# Patient Record
Sex: Female | Born: 1950 | Race: Black or African American | Hispanic: No | Smoking: Never smoker
Health system: Southern US, Community
[De-identification: ages and names within clinical notes are randomized; demographics above are authoritative.]

## PROBLEM LIST (undated history)

## (undated) DIAGNOSIS — I10 Essential (primary) hypertension: Secondary | ICD-10-CM

## (undated) DIAGNOSIS — E119 Type 2 diabetes mellitus without complications: Secondary | ICD-10-CM

## (undated) DIAGNOSIS — I639 Cerebral infarction, unspecified: Secondary | ICD-10-CM

## (undated) DIAGNOSIS — R3129 Other microscopic hematuria: Secondary | ICD-10-CM

## (undated) DIAGNOSIS — R6521 Severe sepsis with septic shock: Secondary | ICD-10-CM

## (undated) DIAGNOSIS — N200 Calculus of kidney: Secondary | ICD-10-CM

## (undated) DIAGNOSIS — G4733 Obstructive sleep apnea (adult) (pediatric): Secondary | ICD-10-CM

## (undated) DIAGNOSIS — R0600 Dyspnea, unspecified: Secondary | ICD-10-CM

## (undated) DIAGNOSIS — N179 Acute kidney failure, unspecified: Secondary | ICD-10-CM

## (undated) DIAGNOSIS — N390 Urinary tract infection, site not specified: Secondary | ICD-10-CM

## (undated) DIAGNOSIS — K219 Gastro-esophageal reflux disease without esophagitis: Secondary | ICD-10-CM

## (undated) DIAGNOSIS — Z8673 Personal history of transient ischemic attack (TIA), and cerebral infarction without residual deficits: Secondary | ICD-10-CM

## (undated) DIAGNOSIS — E118 Type 2 diabetes mellitus with unspecified complications: Secondary | ICD-10-CM

## (undated) DIAGNOSIS — A419 Sepsis, unspecified organism: Secondary | ICD-10-CM

## (undated) DIAGNOSIS — Z0000 Encounter for general adult medical examination without abnormal findings: Secondary | ICD-10-CM

## (undated) DIAGNOSIS — I739 Peripheral vascular disease, unspecified: Secondary | ICD-10-CM

## (undated) HISTORY — DX: Personal history of transient ischemic attack (TIA), and cerebral infarction without residual deficits: Z86.73

## (undated) HISTORY — PX: COMBINED LAPAROSCOPY W/ HYSTEROSCOPY: SUR299

## (undated) HISTORY — DX: Other microscopic hematuria: R31.29

## (undated) HISTORY — DX: Essential (primary) hypertension: I10

## (undated) HISTORY — DX: Encounter for general adult medical examination without abnormal findings: Z00.00

## (undated) HISTORY — DX: Type 2 diabetes mellitus with unspecified complications: E11.8

## (undated) HISTORY — DX: Calculus of kidney: N20.0

## (undated) HISTORY — PX: VAGINAL HYSTERECTOMY: SUR661

## (undated) HISTORY — DX: Acute kidney failure, unspecified: N17.9

## (undated) HISTORY — DX: Obstructive sleep apnea (adult) (pediatric): G47.33

---

## 1969-02-24 HISTORY — PX: BREAST EXCISIONAL BIOPSY: SUR124

## 2004-09-05 ENCOUNTER — Ambulatory Visit: Payer: Self-pay | Admitting: Internal Medicine

## 2005-09-16 ENCOUNTER — Ambulatory Visit: Payer: Self-pay | Admitting: Internal Medicine

## 2005-09-17 ENCOUNTER — Ambulatory Visit: Payer: Self-pay | Admitting: Internal Medicine

## 2006-09-24 ENCOUNTER — Ambulatory Visit: Payer: Self-pay | Admitting: Internal Medicine

## 2006-12-08 ENCOUNTER — Ambulatory Visit: Payer: Self-pay | Admitting: Internal Medicine

## 2007-06-25 ENCOUNTER — Ambulatory Visit: Payer: Self-pay | Admitting: Unknown Physician Specialty

## 2007-09-27 ENCOUNTER — Ambulatory Visit: Payer: Self-pay | Admitting: Internal Medicine

## 2008-09-27 ENCOUNTER — Ambulatory Visit: Payer: Self-pay | Admitting: Internal Medicine

## 2009-01-02 ENCOUNTER — Ambulatory Visit: Payer: Self-pay | Admitting: Urology

## 2009-01-22 ENCOUNTER — Ambulatory Visit: Payer: Self-pay | Admitting: Urology

## 2009-03-01 ENCOUNTER — Ambulatory Visit: Payer: Self-pay | Admitting: Urology

## 2009-03-12 ENCOUNTER — Ambulatory Visit: Payer: Self-pay | Admitting: Urology

## 2009-03-29 ENCOUNTER — Ambulatory Visit: Payer: Self-pay | Admitting: Urology

## 2009-10-03 ENCOUNTER — Ambulatory Visit: Payer: Self-pay | Admitting: Internal Medicine

## 2010-02-19 IMAGING — CR DG ABDOMEN 1V
1 series · 1 of 1 positions shown · non-contrast
Comparison: none

REASON FOR EXAM: pre eswl flank pain
COMMENTS:

[view not recorded]
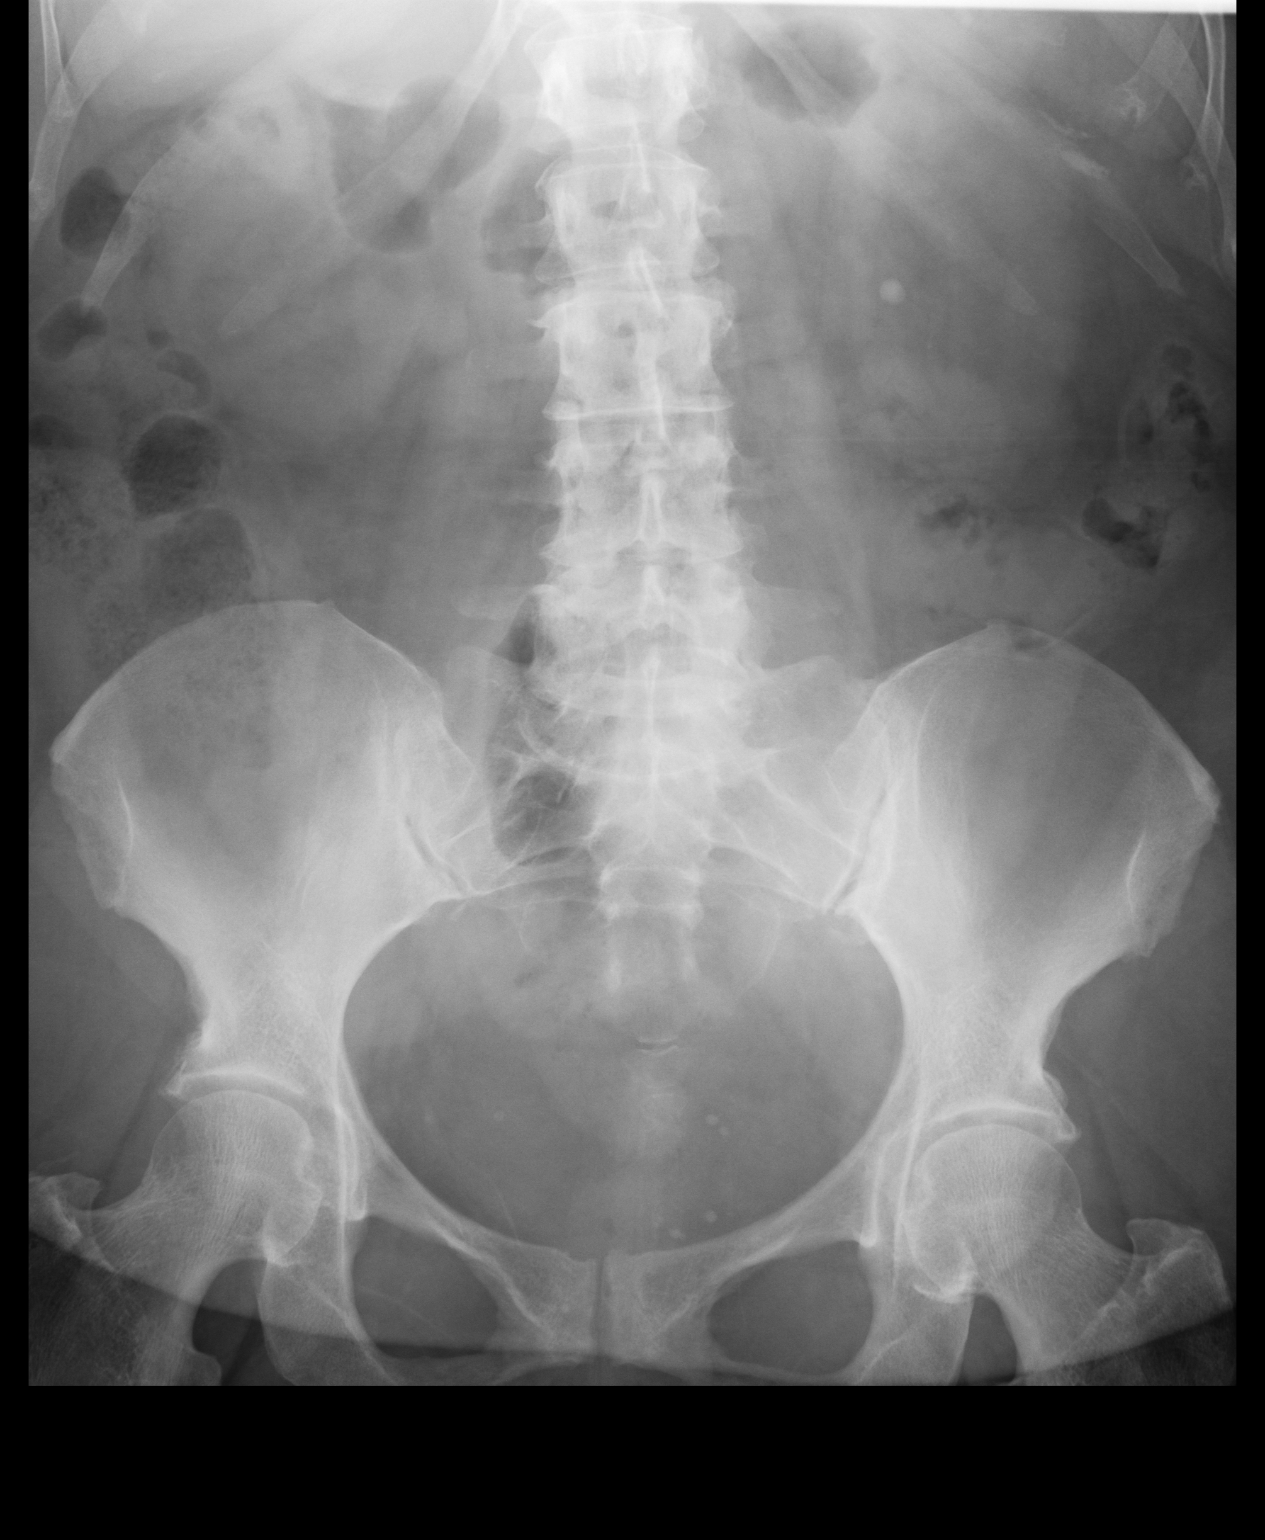

[1 of 1 positions shown; findings below may reference images not displayed]

PROCEDURE:     DXR - DXR KIDNEY URETER BLADDER  - March 01, 2009 [DATE]

RESULT:     Comparison is made to the prior exam of 01/22/2009. There is an
8 mm calcification projected over the midpole region of the left kidney near
the level of the left renal pelvis. No definite right renal or right
ureteral stones are seen. Multiple phleboliths are again observed in the
pelvis.
IMPRESSION: 1.     Left nephrolithiasis.

## 2010-03-27 ENCOUNTER — Ambulatory Visit: Payer: Self-pay | Admitting: Urology

## 2010-11-06 ENCOUNTER — Ambulatory Visit: Payer: Self-pay | Admitting: Internal Medicine

## 2011-02-25 HISTORY — PX: EXTRACORPOREAL SHOCK WAVE LITHOTRIPSY: SHX1557

## 2011-04-02 ENCOUNTER — Ambulatory Visit: Payer: Self-pay | Admitting: Urology

## 2011-11-07 ENCOUNTER — Ambulatory Visit: Payer: Self-pay | Admitting: Internal Medicine

## 2012-11-05 ENCOUNTER — Ambulatory Visit: Payer: Self-pay | Admitting: Gastroenterology

## 2012-11-08 LAB — PATHOLOGY REPORT

## 2012-11-18 ENCOUNTER — Ambulatory Visit: Payer: Self-pay | Admitting: Internal Medicine

## 2013-11-21 DIAGNOSIS — G4733 Obstructive sleep apnea (adult) (pediatric): Secondary | ICD-10-CM

## 2013-11-21 DIAGNOSIS — E118 Type 2 diabetes mellitus with unspecified complications: Secondary | ICD-10-CM

## 2013-11-21 HISTORY — DX: Type 2 diabetes mellitus with unspecified complications: E11.8

## 2013-11-21 HISTORY — DX: Obstructive sleep apnea (adult) (pediatric): G47.33

## 2013-11-29 ENCOUNTER — Ambulatory Visit: Payer: Self-pay | Admitting: Internal Medicine

## 2014-02-03 ENCOUNTER — Ambulatory Visit: Payer: Self-pay | Admitting: Internal Medicine

## 2014-04-25 DIAGNOSIS — I639 Cerebral infarction, unspecified: Secondary | ICD-10-CM

## 2014-04-25 HISTORY — DX: Cerebral infarction, unspecified: I63.9

## 2014-04-28 ENCOUNTER — Ambulatory Visit: Payer: Self-pay | Admitting: Specialist

## 2014-05-07 ENCOUNTER — Emergency Department: Payer: Self-pay | Admitting: Emergency Medicine

## 2014-05-09 DIAGNOSIS — E782 Mixed hyperlipidemia: Secondary | ICD-10-CM | POA: Insufficient documentation

## 2014-05-24 ENCOUNTER — Ambulatory Visit
Admit: 2014-05-24 | Disposition: A | Discharge status: Home / Self Care | Payer: Self-pay | Attending: Internal Medicine | Admitting: Internal Medicine

## 2014-07-10 ENCOUNTER — Other Ambulatory Visit: Payer: Self-pay | Admitting: Internal Medicine

## 2014-07-10 DIAGNOSIS — I6350 Cerebral infarction due to unspecified occlusion or stenosis of unspecified cerebral artery: Secondary | ICD-10-CM

## 2014-07-12 ENCOUNTER — Ambulatory Visit
Admission: RE | Admit: 2014-07-12 | Discharge: 2014-07-12 | Disposition: A | Discharge status: Home / Self Care | Payer: 59 | Source: Ambulatory Visit | Attending: Internal Medicine | Admitting: Internal Medicine

## 2014-07-12 DIAGNOSIS — I6523 Occlusion and stenosis of bilateral carotid arteries: Secondary | ICD-10-CM | POA: Diagnosis not present

## 2014-07-12 DIAGNOSIS — I6350 Cerebral infarction due to unspecified occlusion or stenosis of unspecified cerebral artery: Secondary | ICD-10-CM | POA: Diagnosis present

## 2014-11-28 ENCOUNTER — Other Ambulatory Visit: Payer: Self-pay | Admitting: Internal Medicine

## 2014-11-28 DIAGNOSIS — Z1231 Encounter for screening mammogram for malignant neoplasm of breast: Secondary | ICD-10-CM

## 2014-12-05 ENCOUNTER — Ambulatory Visit
Admission: RE | Admit: 2014-12-05 | Discharge: 2014-12-05 | Disposition: A | Discharge status: Home / Self Care | Payer: Commercial Managed Care - HMO | Source: Ambulatory Visit | Attending: Internal Medicine | Admitting: Internal Medicine

## 2014-12-05 ENCOUNTER — Other Ambulatory Visit: Payer: Self-pay | Admitting: Internal Medicine

## 2014-12-05 DIAGNOSIS — Z1231 Encounter for screening mammogram for malignant neoplasm of breast: Secondary | ICD-10-CM | POA: Diagnosis not present

## 2015-12-20 ENCOUNTER — Other Ambulatory Visit: Payer: Self-pay | Admitting: Internal Medicine

## 2015-12-20 DIAGNOSIS — Z1231 Encounter for screening mammogram for malignant neoplasm of breast: Secondary | ICD-10-CM

## 2015-12-20 DIAGNOSIS — E538 Deficiency of other specified B group vitamins: Secondary | ICD-10-CM | POA: Insufficient documentation

## 2015-12-21 ENCOUNTER — Ambulatory Visit
Admission: RE | Admit: 2015-12-21 | Discharge: 2015-12-21 | Disposition: A | Discharge status: Home / Self Care | Payer: Commercial Managed Care - HMO | Source: Ambulatory Visit | Attending: Internal Medicine | Admitting: Internal Medicine

## 2015-12-21 DIAGNOSIS — Z1231 Encounter for screening mammogram for malignant neoplasm of breast: Secondary | ICD-10-CM | POA: Insufficient documentation

## 2016-02-25 DIAGNOSIS — N179 Acute kidney failure, unspecified: Secondary | ICD-10-CM

## 2016-02-25 HISTORY — DX: Acute kidney failure, unspecified: N17.9

## 2016-06-16 DIAGNOSIS — Z8673 Personal history of transient ischemic attack (TIA), and cerebral infarction without residual deficits: Secondary | ICD-10-CM | POA: Insufficient documentation

## 2016-06-16 DIAGNOSIS — Z0000 Encounter for general adult medical examination without abnormal findings: Secondary | ICD-10-CM

## 2016-06-16 HISTORY — DX: Personal history of transient ischemic attack (TIA), and cerebral infarction without residual deficits: Z86.73

## 2016-06-16 HISTORY — DX: Encounter for general adult medical examination without abnormal findings: Z00.00

## 2016-08-24 ENCOUNTER — Inpatient Hospital Stay: Payer: Medicare PPO

## 2016-08-24 ENCOUNTER — Emergency Department: Payer: Medicare PPO

## 2016-08-24 ENCOUNTER — Inpatient Hospital Stay
Admission: EM | Admit: 2016-08-24 | Discharge: 2016-09-05 | DRG: 870 | Disposition: A | Discharge status: Skilled Nursing Facility | Payer: Medicare PPO | Attending: Internal Medicine | Admitting: Internal Medicine

## 2016-08-24 ENCOUNTER — Encounter: Payer: Self-pay | Admitting: Emergency Medicine

## 2016-08-24 DIAGNOSIS — N184 Chronic kidney disease, stage 4 (severe): Secondary | ICD-10-CM | POA: Diagnosis present

## 2016-08-24 DIAGNOSIS — N201 Calculus of ureter: Secondary | ICD-10-CM

## 2016-08-24 DIAGNOSIS — E1152 Type 2 diabetes mellitus with diabetic peripheral angiopathy with gangrene: Secondary | ICD-10-CM | POA: Diagnosis present

## 2016-08-24 DIAGNOSIS — D631 Anemia in chronic kidney disease: Secondary | ICD-10-CM | POA: Diagnosis present

## 2016-08-24 DIAGNOSIS — R109 Unspecified abdominal pain: Secondary | ICD-10-CM | POA: Diagnosis not present

## 2016-08-24 DIAGNOSIS — N200 Calculus of kidney: Secondary | ICD-10-CM

## 2016-08-24 DIAGNOSIS — I248 Other forms of acute ischemic heart disease: Secondary | ICD-10-CM | POA: Diagnosis present

## 2016-08-24 DIAGNOSIS — G9340 Encephalopathy, unspecified: Secondary | ICD-10-CM | POA: Diagnosis not present

## 2016-08-24 DIAGNOSIS — D65 Disseminated intravascular coagulation [defibrination syndrome]: Secondary | ICD-10-CM | POA: Diagnosis present

## 2016-08-24 DIAGNOSIS — A419 Sepsis, unspecified organism: Secondary | ICD-10-CM | POA: Diagnosis present

## 2016-08-24 DIAGNOSIS — J80 Acute respiratory distress syndrome: Secondary | ICD-10-CM | POA: Diagnosis present

## 2016-08-24 DIAGNOSIS — R310 Gross hematuria: Secondary | ICD-10-CM | POA: Diagnosis not present

## 2016-08-24 DIAGNOSIS — A4159 Other Gram-negative sepsis: Principal | ICD-10-CM | POA: Diagnosis present

## 2016-08-24 DIAGNOSIS — N136 Pyonephrosis: Secondary | ICD-10-CM | POA: Diagnosis present

## 2016-08-24 DIAGNOSIS — N170 Acute kidney failure with tubular necrosis: Secondary | ICD-10-CM | POA: Diagnosis present

## 2016-08-24 DIAGNOSIS — E2740 Unspecified adrenocortical insufficiency: Secondary | ICD-10-CM | POA: Diagnosis present

## 2016-08-24 DIAGNOSIS — D72829 Elevated white blood cell count, unspecified: Secondary | ICD-10-CM

## 2016-08-24 DIAGNOSIS — Z79899 Other long term (current) drug therapy: Secondary | ICD-10-CM | POA: Diagnosis not present

## 2016-08-24 DIAGNOSIS — R531 Weakness: Secondary | ICD-10-CM | POA: Diagnosis not present

## 2016-08-24 DIAGNOSIS — R000 Tachycardia, unspecified: Secondary | ICD-10-CM

## 2016-08-24 DIAGNOSIS — R34 Anuria and oliguria: Secondary | ICD-10-CM | POA: Diagnosis present

## 2016-08-24 DIAGNOSIS — G7281 Critical illness myopathy: Secondary | ICD-10-CM | POA: Diagnosis present

## 2016-08-24 DIAGNOSIS — R1915 Other abnormal bowel sounds: Secondary | ICD-10-CM

## 2016-08-24 DIAGNOSIS — R4182 Altered mental status, unspecified: Secondary | ICD-10-CM | POA: Diagnosis not present

## 2016-08-24 DIAGNOSIS — J9601 Acute respiratory failure with hypoxia: Secondary | ICD-10-CM | POA: Diagnosis present

## 2016-08-24 DIAGNOSIS — Z8673 Personal history of transient ischemic attack (TIA), and cerebral infarction without residual deficits: Secondary | ICD-10-CM | POA: Diagnosis not present

## 2016-08-24 DIAGNOSIS — N12 Tubulo-interstitial nephritis, not specified as acute or chronic: Secondary | ICD-10-CM | POA: Diagnosis not present

## 2016-08-24 DIAGNOSIS — J9690 Respiratory failure, unspecified, unspecified whether with hypoxia or hypercapnia: Secondary | ICD-10-CM | POA: Diagnosis not present

## 2016-08-24 DIAGNOSIS — J189 Pneumonia, unspecified organism: Secondary | ICD-10-CM | POA: Diagnosis present

## 2016-08-24 DIAGNOSIS — E872 Acidosis: Secondary | ICD-10-CM | POA: Diagnosis present

## 2016-08-24 DIAGNOSIS — Z7984 Long term (current) use of oral hypoglycemic drugs: Secondary | ICD-10-CM | POA: Diagnosis not present

## 2016-08-24 DIAGNOSIS — N1330 Unspecified hydronephrosis: Secondary | ICD-10-CM

## 2016-08-24 DIAGNOSIS — N139 Obstructive and reflux uropathy, unspecified: Secondary | ICD-10-CM

## 2016-08-24 DIAGNOSIS — Z4659 Encounter for fitting and adjustment of other gastrointestinal appliance and device: Secondary | ICD-10-CM

## 2016-08-24 DIAGNOSIS — J9600 Acute respiratory failure, unspecified whether with hypoxia or hypercapnia: Secondary | ICD-10-CM

## 2016-08-24 DIAGNOSIS — R6521 Severe sepsis with septic shock: Secondary | ICD-10-CM | POA: Diagnosis not present

## 2016-08-24 DIAGNOSIS — B961 Klebsiella pneumoniae [K. pneumoniae] as the cause of diseases classified elsewhere: Secondary | ICD-10-CM | POA: Diagnosis not present

## 2016-08-24 DIAGNOSIS — N189 Chronic kidney disease, unspecified: Secondary | ICD-10-CM | POA: Diagnosis not present

## 2016-08-24 DIAGNOSIS — G92 Toxic encephalopathy: Secondary | ICD-10-CM | POA: Diagnosis present

## 2016-08-24 DIAGNOSIS — N132 Hydronephrosis with renal and ureteral calculous obstruction: Secondary | ICD-10-CM | POA: Diagnosis not present

## 2016-08-24 DIAGNOSIS — N172 Acute kidney failure with medullary necrosis: Secondary | ICD-10-CM | POA: Diagnosis not present

## 2016-08-24 DIAGNOSIS — I129 Hypertensive chronic kidney disease with stage 1 through stage 4 chronic kidney disease, or unspecified chronic kidney disease: Secondary | ICD-10-CM | POA: Diagnosis present

## 2016-08-24 DIAGNOSIS — N179 Acute kidney failure, unspecified: Secondary | ICD-10-CM

## 2016-08-24 DIAGNOSIS — R7881 Bacteremia: Secondary | ICD-10-CM | POA: Diagnosis not present

## 2016-08-24 DIAGNOSIS — E1122 Type 2 diabetes mellitus with diabetic chronic kidney disease: Secondary | ICD-10-CM | POA: Diagnosis present

## 2016-08-24 DIAGNOSIS — E119 Type 2 diabetes mellitus without complications: Secondary | ICD-10-CM | POA: Diagnosis not present

## 2016-08-24 DIAGNOSIS — K7460 Unspecified cirrhosis of liver: Secondary | ICD-10-CM | POA: Diagnosis present

## 2016-08-24 DIAGNOSIS — R6520 Severe sepsis without septic shock: Secondary | ICD-10-CM | POA: Diagnosis not present

## 2016-08-24 DIAGNOSIS — N390 Urinary tract infection, site not specified: Secondary | ICD-10-CM | POA: Diagnosis not present

## 2016-08-24 HISTORY — PX: IR NEPHROSTOMY PLACEMENT LEFT: IMG6063

## 2016-08-24 HISTORY — DX: Cerebral infarction, unspecified: I63.9

## 2016-08-24 HISTORY — DX: Type 2 diabetes mellitus without complications: E11.9

## 2016-08-24 HISTORY — DX: Essential (primary) hypertension: I10

## 2016-08-24 LAB — URINALYSIS, COMPLETE (UACMP) WITH MICROSCOPIC
Bilirubin Urine: NEGATIVE
GLUCOSE, UA: NEGATIVE mg/dL
KETONES UR: 5 mg/dL — AB
Leukocytes, UA: NEGATIVE
Nitrite: POSITIVE — AB
PROTEIN: 100 mg/dL — AB
Specific Gravity, Urine: 1.019 (ref 1.005–1.030)
pH: 5 (ref 5.0–8.0)

## 2016-08-24 LAB — CREATININE, SERUM
CREATININE: 2.57 mg/dL — AB (ref 0.44–1.00)
GFR, EST AFRICAN AMERICAN: 21 mL/min — AB (ref >60)
GFR, EST NON AFRICAN AMERICAN: 18 mL/min — AB (ref >60)

## 2016-08-24 LAB — GLUCOSE, CAPILLARY
GLUCOSE-CAPILLARY: 41 mg/dL — AB (ref 65–99)
GLUCOSE-CAPILLARY: 242 mg/dL — AB (ref 65–99)
Glucose-Capillary: 89 mg/dL (ref 65–99)
Glucose-Capillary: 55 mg/dL — ABNORMAL LOW (ref 65–99)
Glucose-Capillary: 165 mg/dL — ABNORMAL HIGH (ref 65–99)

## 2016-08-24 LAB — CBC WITH DIFFERENTIAL/PLATELET
BAND NEUTROPHILS: 13 %
BASOS PCT: 0 %
Basophils Absolute: 0 10*3/uL (ref 0–0.1)
Blasts: 0 %
EOS ABS: 0 10*3/uL (ref 0–0.7)
Eosinophils Relative: 0 %
HCT: 34.7 % — ABNORMAL LOW (ref 35.0–47.0)
Hemoglobin: 11.1 g/dL — ABNORMAL LOW (ref 12.0–16.0)
LYMPHS PCT: 7 %
Lymphs Abs: 1.6 10*3/uL (ref 1.0–3.6)
MCH: 29.3 pg (ref 26.0–34.0)
MCHC: 31.9 g/dL — AB (ref 32.0–36.0)
MCV: 91.9 fL (ref 80.0–100.0)
MONO ABS: 0.5 10*3/uL (ref 0.2–0.9)
MONOS PCT: 2 %
Metamyelocytes Relative: 17 %
Myelocytes: 0 %
NEUTROS ABS: 21.3 10*3/uL — AB (ref 1.4–6.5)
Neutrophils Relative %: 61 %
Other: 0 %
PLATELETS: 69 10*3/uL — AB (ref 150–440)
Promyelocytes Absolute: 0 %
RBC: 3.77 MIL/uL — ABNORMAL LOW (ref 3.80–5.20)
RDW: 17.2 % — ABNORMAL HIGH (ref 11.5–14.5)
WBC: 23.4 10*3/uL — ABNORMAL HIGH (ref 3.6–11.0)
nRBC: 0 /100 WBC

## 2016-08-24 LAB — BLOOD CULTURE ID PANEL (REFLEXED)
Acinetobacter baumannii: NOT DETECTED
CANDIDA KRUSEI: NOT DETECTED
CARBAPENEM RESISTANCE: NOT DETECTED
Candida albicans: NOT DETECTED
Candida glabrata: NOT DETECTED
Candida parapsilosis: NOT DETECTED
Candida tropicalis: NOT DETECTED
ENTEROBACTERIACEAE SPECIES: DETECTED — AB
ENTEROCOCCUS SPECIES: NOT DETECTED
Enterobacter cloacae complex: NOT DETECTED
Escherichia coli: NOT DETECTED
Haemophilus influenzae: NOT DETECTED
Klebsiella oxytoca: NOT DETECTED
Klebsiella pneumoniae: DETECTED — AB
LISTERIA MONOCYTOGENES: NOT DETECTED
Neisseria meningitidis: NOT DETECTED
PSEUDOMONAS AERUGINOSA: NOT DETECTED
Proteus species: NOT DETECTED
SERRATIA MARCESCENS: NOT DETECTED
STAPHYLOCOCCUS AUREUS BCID: NOT DETECTED
STAPHYLOCOCCUS SPECIES: NOT DETECTED
STREPTOCOCCUS PNEUMONIAE: NOT DETECTED
STREPTOCOCCUS PYOGENES: NOT DETECTED
STREPTOCOCCUS SPECIES: NOT DETECTED
Streptococcus agalactiae: NOT DETECTED

## 2016-08-24 LAB — COMPREHENSIVE METABOLIC PANEL
ALBUMIN: 2.7 g/dL — AB (ref 3.5–5.0)
ALK PHOS: 78 U/L (ref 38–126)
ALK PHOS: 79 U/L (ref 38–126)
ALT: 44 U/L (ref 14–54)
ALT: 43 U/L (ref 14–54)
ANION GAP: 20 — AB (ref 5–15)
ANION GAP: 24 — AB (ref 5–15)
AST: 83 U/L — ABNORMAL HIGH (ref 15–41)
AST: 94 U/L — AB (ref 15–41)
Albumin: 3.2 g/dL — ABNORMAL LOW (ref 3.5–5.0)
BUN: 27 mg/dL — ABNORMAL HIGH (ref 6–20)
BUN: 35 mg/dL — AB (ref 6–20)
CALCIUM: 8.7 mg/dL — AB (ref 8.9–10.3)
CO2: 17 mmol/L — AB (ref 22–32)
CO2: 12 mmol/L — AB (ref 22–32)
Calcium: 7.1 mg/dL — ABNORMAL LOW (ref 8.9–10.3)
Chloride: 101 mmol/L (ref 101–111)
Chloride: 98 mmol/L — ABNORMAL LOW (ref 101–111)
Creatinine, Ser: 1.91 mg/dL — ABNORMAL HIGH (ref 0.44–1.00)
Creatinine, Ser: 2.62 mg/dL — ABNORMAL HIGH (ref 0.44–1.00)
GFR calc Af Amer: 20 mL/min — ABNORMAL LOW (ref >60)
GFR calc non Af Amer: 26 mL/min — ABNORMAL LOW (ref >60)
GFR calc non Af Amer: 18 mL/min — ABNORMAL LOW (ref >60)
GFR, EST AFRICAN AMERICAN: 30 mL/min — AB (ref >60)
GLUCOSE: 274 mg/dL — AB (ref 65–99)
Glucose, Bld: 76 mg/dL (ref 65–99)
POTASSIUM: 3.7 mmol/L (ref 3.5–5.1)
Potassium: 3.5 mmol/L (ref 3.5–5.1)
SODIUM: 138 mmol/L (ref 135–145)
SODIUM: 134 mmol/L — AB (ref 135–145)
TOTAL PROTEIN: 6.7 g/dL (ref 6.5–8.1)
Total Bilirubin: 0.9 mg/dL (ref 0.3–1.2)
Total Bilirubin: 1 mg/dL (ref 0.3–1.2)
Total Protein: 5.8 g/dL — ABNORMAL LOW (ref 6.5–8.1)

## 2016-08-24 LAB — CBC
HCT: 39.5 % (ref 35.0–47.0)
HCT: 36.1 % (ref 35.0–47.0)
HEMOGLOBIN: 11.9 g/dL — AB (ref 12.0–16.0)
Hemoglobin: 13 g/dL (ref 12.0–16.0)
MCH: 29.2 pg (ref 26.0–34.0)
MCH: 30.1 pg (ref 26.0–34.0)
MCHC: 32.9 g/dL (ref 32.0–36.0)
MCHC: 32.9 g/dL (ref 32.0–36.0)
MCV: 88.9 fL (ref 80.0–100.0)
MCV: 91.3 fL (ref 80.0–100.0)
PLATELETS: 87 10*3/uL — AB (ref 150–440)
Platelets: 54 10*3/uL — ABNORMAL LOW (ref 150–440)
RBC: 4.44 MIL/uL (ref 3.80–5.20)
RBC: 3.96 MIL/uL (ref 3.80–5.20)
RDW: 16.3 % — ABNORMAL HIGH (ref 11.5–14.5)
RDW: 17.3 % — AB (ref 11.5–14.5)
WBC: 14.8 10*3/uL — ABNORMAL HIGH (ref 3.6–11.0)
WBC: 34.1 10*3/uL — AB (ref 3.6–11.0)

## 2016-08-24 LAB — BLOOD GAS, ARTERIAL
ACID-BASE DEFICIT: 10.1 mmol/L — AB (ref 0.0–2.0)
Acid-base deficit: 18.1 mmol/L — ABNORMAL HIGH (ref 0.0–2.0)
Bicarbonate: 14 mmol/L — ABNORMAL LOW (ref 20.0–28.0)
Bicarbonate: 9.5 mmol/L — ABNORMAL LOW (ref 20.0–28.0)
FIO2: 0.28
FIO2: 0.7
LHR: 20 {breaths}/min
MECHVT: 500 mL
O2 SAT: 91.7 %
O2 SAT: 95.5 %
PCO2 ART: 26 mmHg — AB (ref 32.0–48.0)
PCO2 ART: 28 mmHg — AB (ref 32.0–48.0)
PEEP/CPAP: 5 cmH2O
PH ART: 7.14 — AB (ref 7.350–7.450)
PO2 ART: 101 mmHg (ref 83.0–108.0)
Patient temperature: 37
Patient temperature: 37
pH, Arterial: 7.34 — ABNORMAL LOW (ref 7.350–7.450)
pO2, Arterial: 67 mmHg — ABNORMAL LOW (ref 83.0–108.0)

## 2016-08-24 LAB — TROPONIN I
TROPONIN I: 0.13 ng/mL — AB (ref <0.03)
Troponin I: 0.04 ng/mL (ref <0.03)
Troponin I: 0.13 ng/mL (ref <0.03)
Troponin I: 0.77 ng/mL (ref <0.03)

## 2016-08-24 LAB — APTT
aPTT: 64 seconds — ABNORMAL HIGH (ref 24–36)
aPTT: 68 seconds — ABNORMAL HIGH (ref 24–36)
aPTT: 52 seconds — ABNORMAL HIGH (ref 24–36)

## 2016-08-24 LAB — TYPE AND SCREEN
ABO/RH(D): O POS
ANTIBODY SCREEN: NEGATIVE

## 2016-08-24 LAB — PROTIME-INR
INR: 2.05
INR: 2.22
INR: 2.17
Prothrombin Time: 23.4 seconds — ABNORMAL HIGH (ref 11.4–15.2)
Prothrombin Time: 25 seconds — ABNORMAL HIGH (ref 11.4–15.2)
Prothrombin Time: 24.5 seconds — ABNORMAL HIGH (ref 11.4–15.2)

## 2016-08-24 LAB — LACTIC ACID, PLASMA
LACTIC ACID, VENOUS: 10.6 mmol/L — AB (ref 0.5–1.9)
LACTIC ACID, VENOUS: 8.5 mmol/L — AB (ref 0.5–1.9)
Lactic Acid, Venous: 14 mmol/L (ref 0.5–1.9)

## 2016-08-24 LAB — PROCALCITONIN: Procalcitonin: >150 ng/mL

## 2016-08-24 LAB — TRIGLYCERIDES: Triglycerides: 505 mg/dL — ABNORMAL HIGH (ref <150)

## 2016-08-24 LAB — MAGNESIUM: Magnesium: 1.1 mg/dL — ABNORMAL LOW (ref 1.7–2.4)

## 2016-08-24 LAB — FIBRINOGEN: Fibrinogen: 253 mg/dL (ref 210–475)

## 2016-08-24 LAB — PHOSPHORUS: PHOSPHORUS: 6.5 mg/dL — AB (ref 2.5–4.6)

## 2016-08-24 LAB — FIBRIN DERIVATIVES D-DIMER (ARMC ONLY)

## 2016-08-24 LAB — VANCOMYCIN, RANDOM: Vancomycin Rm: 15

## 2016-08-24 LAB — MRSA PCR SCREENING: MRSA BY PCR: NEGATIVE

## 2016-08-24 MED ORDER — SODIUM CHLORIDE 0.9 % IV BOLUS (SEPSIS)
1000.0000 mL | INTRAVENOUS | Status: AC
  Administered 2016-08-24 – 2016-08-25 (×2): 1000 mL via INTRAVENOUS

## 2016-08-24 MED ORDER — SODIUM BICARBONATE 8.4 % IV SOLN
INTRAVENOUS | Status: AC
  Filled 2016-08-24: qty 50

## 2016-08-24 MED ORDER — PIPERACILLIN-TAZOBACTAM 3.375 G IVPB
3.3750 g | Freq: Three times a day (TID) | INTRAVENOUS | Status: DC
  Administered 2016-08-24 – 2016-08-26 (×6): 3.375 g via INTRAVENOUS
  Filled 2016-08-24 (×8): qty 50

## 2016-08-24 MED ORDER — PROPOFOL 1000 MG/100ML IV EMUL
5.0000 ug/kg/min | INTRAVENOUS | Status: DC
  Administered 2016-08-24: 50 ug/kg/min via INTRAVENOUS
  Administered 2016-08-24 – 2016-08-25 (×3): 40 ug/kg/min via INTRAVENOUS
  Filled 2016-08-24 (×3): qty 100

## 2016-08-24 MED ORDER — SODIUM CHLORIDE 0.9% FLUSH: 10.0000 mL | INTRAVENOUS | Status: DC | PRN

## 2016-08-24 MED ORDER — NOREPINEPHRINE BITARTRATE 1 MG/ML IV SOLN
0.0000 ug/min | Freq: Once | INTRAVENOUS | Status: AC
  Administered 2016-08-24: 5 ug/min via INTRAVENOUS
  Filled 2016-08-24: qty 4

## 2016-08-24 MED ORDER — DOPAMINE-DEXTROSE 3.2-5 MG/ML-% IV SOLN: 0.0000 ug/kg/min | INTRAVENOUS | Status: AC

## 2016-08-24 MED ORDER — MIDAZOLAM HCL 5 MG/5ML IJ SOLN
1.0000 mg | Freq: Once | INTRAMUSCULAR | Status: AC
  Administered 2016-08-24: 1 mg via INTRAVENOUS

## 2016-08-24 MED ORDER — FAMOTIDINE IN NACL 20-0.9 MG/50ML-% IV SOLN
20.0000 mg | INTRAVENOUS | Status: DC
  Administered 2016-08-24 – 2016-08-26 (×3): 20 mg via INTRAVENOUS
  Filled 2016-08-24 (×3): qty 50

## 2016-08-24 MED ORDER — CHLORHEXIDINE GLUCONATE 0.12% ORAL RINSE (MEDLINE KIT)
15.0000 mL | Freq: Two times a day (BID) | OROMUCOSAL | Status: DC
  Administered 2016-08-24 – 2016-08-29 (×10): 15 mL via OROMUCOSAL

## 2016-08-24 MED ORDER — LIDOCAINE HCL (PF) 1 % IJ SOLN
INTRAMUSCULAR | Status: AC
  Filled 2016-08-24: qty 30

## 2016-08-24 MED ORDER — SODIUM CHLORIDE 0.9 % IV SOLN
0.0000 ug/min | INTRAVENOUS | Status: DC
  Administered 2016-08-24: 200 ug/min via INTRAVENOUS
  Administered 2016-08-25: 160 ug/min via INTRAVENOUS
  Administered 2016-08-25: 75 ug/min via INTRAVENOUS
  Filled 2016-08-24 (×5): qty 4

## 2016-08-24 MED ORDER — PROPOFOL 1000 MG/100ML IV EMUL
INTRAVENOUS | Status: AC
  Filled 2016-08-24: qty 100

## 2016-08-24 MED ORDER — DEXTROSE 5 % IV SOLN
500.0000 mg | Freq: Once | INTRAVENOUS | Status: AC
  Administered 2016-08-24: 500 mg via INTRAVENOUS
  Filled 2016-08-24: qty 500

## 2016-08-24 MED ORDER — DEXTROSE 5 % IV SOLN: 500.0000 mg | INTRAVENOUS | Status: DC

## 2016-08-24 MED ORDER — PHENYLEPHRINE HCL 10 MG/ML IJ SOLN
0.0000 ug/min | INTRAMUSCULAR | Status: DC
  Administered 2016-08-24: 20 ug/min via INTRAVENOUS
  Filled 2016-08-24: qty 1

## 2016-08-24 MED ORDER — MIDAZOLAM HCL 5 MG/5ML IJ SOLN
INTRAMUSCULAR | Status: AC
  Filled 2016-08-24: qty 5

## 2016-08-24 MED ORDER — BISACODYL 5 MG PO TBEC: 5.0000 mg | DELAYED_RELEASE_TABLET | Freq: Every day | ORAL | Status: DC | PRN

## 2016-08-24 MED ORDER — IOPAMIDOL (ISOVUE-370) INJECTION 76%
60.0000 mL | Freq: Once | INTRAVENOUS | Status: AC | PRN
  Administered 2016-08-24: 60 mL via INTRAVENOUS

## 2016-08-24 MED ORDER — ACETAMINOPHEN 650 MG RE SUPP: 650.0000 mg | Freq: Four times a day (QID) | RECTAL | Status: DC | PRN

## 2016-08-24 MED ORDER — DOPAMINE-DEXTROSE 3.2-5 MG/ML-% IV SOLN: 0.0000 ug/kg/min | INTRAVENOUS | Status: DC

## 2016-08-24 MED ORDER — ALBUTEROL SULFATE (2.5 MG/3ML) 0.083% IN NEBU: 2.5000 mg | INHALATION_SOLUTION | RESPIRATORY_TRACT | Status: DC | PRN

## 2016-08-24 MED ORDER — SODIUM BICARBONATE 8.4 % IV SOLN
INTRAVENOUS | Status: DC
  Administered 2016-08-24 – 2016-08-25 (×3): via INTRAVENOUS
  Filled 2016-08-24 (×3): qty 200

## 2016-08-24 MED ORDER — DEXTROSE 50 % IV SOLN
1.0000 | Freq: Once | INTRAVENOUS | Status: AC
  Administered 2016-08-24: 50 mL via INTRAVENOUS

## 2016-08-24 MED ORDER — SODIUM CHLORIDE 0.9 % IV BOLUS (SEPSIS)
1000.0000 mL | Freq: Once | INTRAVENOUS | Status: AC
  Administered 2016-08-24: 1000 mL via INTRAVENOUS

## 2016-08-24 MED ORDER — SODIUM CHLORIDE 0.9 % IV SOLN
1000.0000 mL | Freq: Once | INTRAVENOUS | Status: AC
  Administered 2016-08-24: 1000 mL via INTRAVENOUS

## 2016-08-24 MED ORDER — DOPAMINE-DEXTROSE 3.2-5 MG/ML-% IV SOLN
0.0000 ug/kg/min | Freq: Once | INTRAVENOUS | Status: AC
  Administered 2016-08-24: 10 ug/kg/min via INTRAVENOUS

## 2016-08-24 MED ORDER — NOREPINEPHRINE BITARTRATE 1 MG/ML IV SOLN
0.0000 ug/min | INTRAVENOUS | Status: DC
  Administered 2016-08-24: 40 ug/min via INTRAVENOUS
  Administered 2016-08-25: 34 ug/min via INTRAVENOUS
  Administered 2016-08-25: 30 ug/min via INTRAVENOUS
  Administered 2016-08-26: 35 ug/min via INTRAVENOUS
  Administered 2016-08-26: 18 ug/min via INTRAVENOUS
  Filled 2016-08-24 (×10): qty 16

## 2016-08-24 MED ORDER — PROPOFOL 1000 MG/100ML IV EMUL
INTRAVENOUS | Status: AC
  Administered 2016-08-24: 14:00:00
  Filled 2016-08-24: qty 100

## 2016-08-24 MED ORDER — ONDANSETRON HCL 4 MG PO TABS: 4.0000 mg | ORAL_TABLET | Freq: Four times a day (QID) | ORAL | Status: DC | PRN

## 2016-08-24 MED ORDER — MIDAZOLAM HCL 2 MG/2ML IJ SOLN: 2.0000 mg | Freq: Once | INTRAMUSCULAR | Status: DC

## 2016-08-24 MED ORDER — SUCCINYLCHOLINE CHLORIDE 20 MG/ML IJ SOLN
INTRAMUSCULAR | Status: AC | PRN
  Administered 2016-08-24: 100 mg via INTRAVENOUS

## 2016-08-24 MED ORDER — SODIUM CHLORIDE 0.9 % IV SOLN: INTRAVENOUS | Status: AC

## 2016-08-24 MED ORDER — DOPAMINE-DEXTROSE 1.6-5 MG/ML-% IV SOLN: 2.0000 ug/kg/min | Freq: Once | INTRAVENOUS | Status: DC

## 2016-08-24 MED ORDER — DEXTROSE 50 % IV SOLN
INTRAVENOUS | Status: AC
  Filled 2016-08-24: qty 50

## 2016-08-24 MED ORDER — VANCOMYCIN HCL IN DEXTROSE 1-5 GM/200ML-% IV SOLN
1000.0000 mg | Freq: Once | INTRAVENOUS | Status: AC
  Administered 2016-08-24: 1000 mg via INTRAVENOUS
  Filled 2016-08-24: qty 200

## 2016-08-24 MED ORDER — DEXTROSE 5 % IV SOLN
1.0000 g | Freq: Once | INTRAVENOUS | Status: AC
  Administered 2016-08-24: 1 g via INTRAVENOUS
  Filled 2016-08-24: qty 10

## 2016-08-24 MED ORDER — SODIUM BICARBONATE 8.4 % IV SOLN
50.0000 meq | Freq: Once | INTRAVENOUS | Status: DC
  Filled 2016-08-24: qty 50

## 2016-08-24 MED ORDER — HYDROCORTISONE NA SUCCINATE PF 100 MG IJ SOLR
100.0000 mg | Freq: Three times a day (TID) | INTRAMUSCULAR | Status: DC
  Administered 2016-08-24 – 2016-08-25 (×3): 100 mg via INTRAVENOUS
  Filled 2016-08-24 (×3): qty 2

## 2016-08-24 MED ORDER — DEXTROSE 5 % IV SOLN
INTRAVENOUS | Status: DC
Start: 2016-08-24 — End: 2016-08-24
  Administered 2016-08-24: 18:00:00 via INTRAVENOUS
  Filled 2016-08-24 (×4): qty 150

## 2016-08-24 MED ORDER — VANCOMYCIN HCL IN DEXTROSE 1-5 GM/200ML-% IV SOLN: 1000.0000 mg | INTRAVENOUS | Status: DC

## 2016-08-24 MED ORDER — ACETAMINOPHEN 325 MG PO TABS: 650.0000 mg | ORAL_TABLET | Freq: Four times a day (QID) | ORAL | Status: DC | PRN

## 2016-08-24 MED ORDER — MIDAZOLAM HCL 5 MG/5ML IJ SOLN
INTRAMUSCULAR | Status: AC
  Administered 2016-08-24: 1 mg via INTRAVENOUS
  Filled 2016-08-24: qty 5

## 2016-08-24 MED ORDER — ONDANSETRON HCL 4 MG/2ML IJ SOLN
4.0000 mg | Freq: Four times a day (QID) | INTRAMUSCULAR | Status: DC | PRN
  Administered 2016-08-31: 4 mg via INTRAVENOUS
  Filled 2016-08-24: qty 2

## 2016-08-24 MED ORDER — HEPARIN SODIUM (PORCINE) 5000 UNIT/ML IJ SOLN
5000.0000 [IU] | Freq: Three times a day (TID) | INTRAMUSCULAR | Status: DC
  Administered 2016-08-24 – 2016-08-25 (×3): 5000 [IU] via SUBCUTANEOUS
  Filled 2016-08-24 (×3): qty 1

## 2016-08-24 MED ORDER — PIPERACILLIN-TAZOBACTAM 3.375 G IVPB: 3.3750 g | Freq: Two times a day (BID) | INTRAVENOUS | Status: DC

## 2016-08-24 MED ORDER — SENNOSIDES-DOCUSATE SODIUM 8.6-50 MG PO TABS: 1.0000 | ORAL_TABLET | Freq: Every evening | ORAL | Status: DC | PRN

## 2016-08-24 MED ORDER — VASOPRESSIN 20 UNIT/ML IV SOLN
0.0300 [IU]/min | INTRAVENOUS | Status: DC
  Administered 2016-08-24 – 2016-08-26 (×2): 0.03 [IU]/min via INTRAVENOUS
  Filled 2016-08-24 (×4): qty 2

## 2016-08-24 MED ORDER — ETOMIDATE 2 MG/ML IV SOLN
INTRAVENOUS | Status: AC | PRN
  Administered 2016-08-24: 20 mg via INTRAVENOUS

## 2016-08-24 MED ORDER — INSULIN ASPART 100 UNIT/ML ~~LOC~~ SOLN
2.0000 [IU] | SUBCUTANEOUS | Status: DC
  Administered 2016-08-25: 4 [IU] via SUBCUTANEOUS
  Filled 2016-08-24: qty 1

## 2016-08-24 MED ORDER — SODIUM CHLORIDE 0.9 % IV BOLUS (SEPSIS): 1000.0000 mL | Freq: Once | INTRAVENOUS | Status: DC

## 2016-08-24 MED ORDER — PROPOFOL 10 MG/ML IV BOLUS
INTRAVENOUS | Status: AC | PRN
  Administered 2016-08-24: 17:00:00 via INTRAVENOUS
  Administered 2016-08-24: 20 mg via INTRAVENOUS
  Administered 2016-08-24: 10 mg via INTRAVENOUS

## 2016-08-24 MED ORDER — SODIUM CHLORIDE 0.9 % IV SOLN
INTRAVENOUS | Status: DC
  Administered 2016-08-24: 16:00:00 via INTRAVENOUS

## 2016-08-24 MED ORDER — ORAL CARE MOUTH RINSE
15.0000 mL | OROMUCOSAL | Status: DC
  Administered 2016-08-24 – 2016-08-29 (×46): 15 mL via OROMUCOSAL

## 2016-08-24 MED ORDER — SODIUM CHLORIDE 0.9% FLUSH
10.0000 mL | Freq: Two times a day (BID) | INTRAVENOUS | Status: DC
  Administered 2016-08-24 – 2016-08-27 (×4): 10 mL
  Administered 2016-08-27: 20 mL
  Administered 2016-08-28 – 2016-08-29 (×3): 10 mL
  Administered 2016-08-29: 40 mL
  Administered 2016-08-30: 30 mL
  Administered 2016-08-30 – 2016-09-01 (×4): 10 mL
  Administered 2016-09-02: 3 mL
  Administered 2016-09-02: 10 mL

## 2016-08-24 MED ORDER — SODIUM BICARBONATE 8.4 % IV SOLN
50.0000 meq | Freq: Once | INTRAVENOUS | Status: AC
  Administered 2016-08-24: 50 meq via INTRAVENOUS

## 2016-08-24 MED ORDER — PIPERACILLIN-TAZOBACTAM 3.375 G IVPB 30 MIN
3.3750 g | Freq: Once | INTRAVENOUS | Status: AC
Start: 2016-08-24 — End: 2016-08-24
  Administered 2016-08-24: 3.375 g via INTRAVENOUS
  Filled 2016-08-24: qty 50

## 2016-08-24 MED ORDER — SODIUM BICARBONATE 8.4 % IV SOLN
200.0000 meq | Freq: Once | INTRAVENOUS | Status: AC
  Administered 2016-08-24: 200 meq via INTRAVENOUS
  Filled 2016-08-24: qty 200

## 2016-08-24 MED ORDER — MAGNESIUM SULFATE 4 GM/100ML IV SOLN
4.0000 g | Freq: Once | INTRAVENOUS | Status: AC
  Administered 2016-08-25: 4 g via INTRAVENOUS
  Filled 2016-08-24: qty 100

## 2016-08-24 NOTE — ED Notes (Signed)
Pt's husband states pt took 2 oxycodone yesterday that had been rx to husband. The first approx 13:00 and the second around 17:00. Pt took med due to flank pain.

## 2016-08-24 NOTE — Progress Notes (Signed)
Pt. Was transported to vascular from ED while on the vent.

## 2016-08-24 NOTE — ED Notes (Signed)
Patient transported to CT 

## 2016-08-24 NOTE — Progress Notes (Signed)
ANTIBIOTIC CONSULT NOTE - INITIAL  Pharmacy Consult for Vancomycin and Zosyn  Indication: sepsis  No Known Allergies  Patient Measurements: Height: 5\' 4"  (162.6 cm) Weight: 189 lb 3.2 oz (85.8 kg) IBW/kg (Calculated) : 54.7 Adjusted Body Weight:   Vital Signs: Temp: 98.5 F (36.9 C) (07/01 0712) Temp Source: Oral (07/01 0712) BP: 60/45 (07/01 1354) Pulse Rate: 127 (07/01 1354) Intake/Output from previous day: No intake/output data recorded. Intake/Output from this shift: No intake/output data recorded.  Labs:  Recent Labs  08/24/16 0824 08/24/16 0919  WBC 14.8*  --   HGB 13.0  --   PLT 87*  --   CREATININE  --  1.91*   Estimated Creatinine Clearance: 29.9 mL/min (A) (by C-G formula based on SCr of 1.91 mg/dL (H)). No results for input(s): VANCOTROUGH, VANCOPEAK, VANCORANDOM, GENTTROUGH, GENTPEAK, GENTRANDOM, TOBRATROUGH, TOBRAPEAK, TOBRARND, AMIKACINPEAK, AMIKACINTROU, AMIKACIN in the last 72 hours.   Microbiology: No results found for this or any previous visit (from the past 720 hour(s)).  Medical History: Past Medical History:  Diagnosis Date  . Diabetes mellitus without complication (HCC)   . Hypertension   . Stroke [facility](HCC)     Medications:   (Not in a hospital admission) Scheduled:  . midazolam  2 mg Intravenous Once   Assessment: Pharmacy consulted to assist in the management of Vancomycin and Zosyn in this 66 year old woman being treated for septic shock due to multifocal pna and uti.    Goal of Therapy:  Vancomycin trough level 15-20 mcg/ml  Plan:  Patient received Vancomycin 1 g IV x 1. Will give an additional 1000 mg IV of Vancomycin to = 2000 mg loading dose (~23 mg/kg). Will hold off on starting a regimen for now since patient is in AKI secondary to previously stated condition. Will order Random level ~ 24 hours post dose to assess clearance. Based on level and PK parameters will then start Vancomycin regimen.  Zosyn: Will start Zosyn 3.375 g  IV q8 hours in this critically ill patient.     Megan Nixon D 08/24/2016,2:14 PM

## 2016-08-24 NOTE — Progress Notes (Signed)
eLink Physician-Brief Progress Note Patient Name: Wandalee FerdinandSarah S Moren DOB: [birth year] MRN: [id number]   Date of Service  08/24/2016  HPI/Events of Note  66 yr old female with MSOF including respiratory failure related to septic shock from urinary source.  Chart reviewed.  eICU Interventions  Critical care will follow closely     Intervention Category Evaluation Type: New Patient Evaluation  Henry RusselSMITH, Darian Cansler, P 08/24/2016, 4:10 PM

## 2016-08-24 NOTE — ED Triage Notes (Signed)
Pt arrived via POV from home with husband. Pt is confused in triage, knows name but unable to tell me her birthday and the date. Pt knows where she is at, but does not know why. Pt is slow to respond to commands.  No slurred speech, no facial droop seen at this time.

## 2016-08-24 NOTE — ED Notes (Signed)
Pt unable to answer all triage questions at this time, husband unable to be located at this time. Informed primary RN to ask additional screening questions and do fall screen.

## 2016-08-24 NOTE — Progress Notes (Signed)
Patient from er  Septic shock on 10mdg dopamine gtt as well as levophed gtt at 60mcg iv, stabilizing bp as charted, nephrostomy tube placed emergently per Dr Craig StaggersHinn, has received over 6liter fluid resuscition in er prior to procedure.Has also been receiving iv antibiotics since in ER dept today.Dr Craig StaggersHinn has spoken with patients family with consent obtained by family member with questions answered at length regarding patient status and need for emergent tube placement in this septic situation.i ntubated on vent orally on full vent support and propofol gtt, gtts being monitored during procedure per er dept nurse Kennieth RadHunter Orr Rn,who also will be accompanying myself with patient to icu 5 post procedure. Sinus tachycardia per monitor, pt does attempt to arouse post procedure. Dr Chales AbrahamsGupta present after procedure and placing orders for type and screen for ffp infusion. Dr Craig StaggersHinn accompanying myself,er dept RN as well as resp. Therapist with this patient on ventilator post procedure. Report called to Gwenyth OberAdam Rn on ICU 5 with orders and plan reviewed. sbp stable upon transfer and arrival to ccu 5.

## 2016-08-24 NOTE — ED Provider Notes (Signed)
[facility] Emergency Department Provider Note   ____________________________________________    I have reviewed the triage vital signs and the nursing notes.   HISTORY  Chief Complaint Altered Mental Status  History significantly limited by altered mental status, no family here right now to provide further history   HPI Megan Nixon is a 66 y.o. female who presents with altered mental status. Patient is not entirely clear why she was here but she is obviously confused. Apparently husband brought her to the emergency department but is now nowhere to be found although reportedly he was going to the car. Apparently he reported ams symptoms started last night to triage nurse. Patient has a known history of diabetes. She denies chest pain or shortness of breath. She denies headache or weakness in her extremities.No other history available at this time.    Past Medical History:  Diagnosis Date  . Diabetes mellitus without complication (HCC)   . Hypertension   . Stroke [facility])     Patient Active Problem List   Diagnosis Date Noted  . Septic shock (HCC) 08/24/2016    Past Surgical History:  Procedure Laterality Date  . BREAST EXCISIONAL BIOPSY Bilateral 1971   neg    Prior to Admission medications   Medication Sig Start Date End Date Taking? Authorizing Provider  glimepiride (AMARYL) 1 MG tablet Take 1 mg by mouth daily with breakfast.  06/19/16  Yes [provider]  irbesartan-hydrochlorothiazide (AVALIDE) 300-12.5 MG tablet Take 1 tablet by mouth daily.  06/23/16  Yes [provider]  meloxicam (MOBIC) 15 MG tablet Take 15 mg by mouth daily.   Yes [provider]  metFORMIN (GLUCOPHAGE) 500 MG tablet 500 mg 2 (two) times daily with a meal.  06/23/16  Yes [provider]  verapamil (CALAN-SR) 180 MG CR tablet 180 mg daily.  06/19/16  Yes [provider]     Allergies Patient has no known  allergies.  Family History  Problem Relation Age of Onset  . Breast cancer Neg Hx     Social History Social History  Substance Use Topics  . Smoking status: Unknown If Ever Smoked  . Smokeless tobacco: Not on file  . Alcohol use Not on file    Level V caveat: Unable to obtain Review of Systems due to significant altered mental status     ____________________________________________   PHYSICAL EXAM:  VITAL SIGNS: ED Triage Vitals  Enc Vitals Group     BP 08/24/16 0712 135/66     Pulse Rate 08/24/16 0712 (!) 131     Resp 08/24/16 0712 (!) 40     Temp 08/24/16 0712 98.5 F (36.9 C)     Temp Source 08/24/16 0712 Oral     SpO2 08/24/16 0712 95 %     Weight 08/24/16 0725 85.8 kg (189 lb 3.2 oz)     Height 08/24/16 0725 1.626 m (5\' 4" )     Head Circumference --      Peak Flow --      Pain Score 08/24/16 0716 0     Pain Loc --      Pain Edu? --      Excl. in GC? --     Constitutional: Alert But very disoriented. No acute distress.  Eyes: Conjunctivae are normal.  Head: Atraumatic. Nose: No congestion/rhinnorhea. Mouth/Throat: Mucous membranes are moist.   Neck:  Painless ROM, no vertebral tenderness to palpation Cardiovascular: Tachycardia, regular rhythm. Grossly normal heart sounds.  Good peripheral circulation. Respiratory: Increased respiratory effort without tachypnea.  No retractions. 5 days later rales, no wheezing Gastrointestinal: No tenderness to palpation, no CVA tenderness, benign abdominal exam Genitourinary: deferred Musculoskeletal:   Warm and well perfused Neurologic:  Cranial nerves II-12 normal. No gross focal neurologic deficits are appreciated.  Skin:  Skin is warm, dry and intact. No rash noted.   ____________________________________________   LABS (all labs ordered are listed, but only abnormal results are displayed)  Labs Reviewed  CBC - Abnormal; Notable for the following:       Result Value   WBC 14.8 (*)    RDW 16.3 (*)     Platelets 87 (*)    All other components within normal limits  LACTIC ACID, PLASMA - Abnormal; Notable for the following:    Lactic Acid, Venous 10.6 (*)    All other components within normal limits  LACTIC ACID, PLASMA - Abnormal; Notable for the following:    Lactic Acid, Venous 8.5 (*)    All other components within normal limits  URINALYSIS, COMPLETE (UACMP) WITH MICROSCOPIC - Abnormal; Notable for the following:    Color, Urine AMBER (*)    APPearance HAZY (*)    Hgb urine dipstick SMALL (*)    Ketones, ur 5 (*)    Protein, ur 100 (*)    Nitrite POSITIVE (*)    Bacteria, UA MANY (*)    Squamous Epithelial / LPF 0-5 (*)    All other components within normal limits  BLOOD GAS, ARTERIAL - Abnormal; Notable for the following:    pH, Arterial 7.34 (*)    pCO2 arterial 26 (*)    pO2, Arterial 67 (*)    Bicarbonate 14.0 (*)    Acid-base deficit 10.1 (*)    All other components within normal limits  COMPREHENSIVE METABOLIC PANEL - Abnormal; Notable for the following:    CO2 17 (*)    BUN 27 (*)    Creatinine, Ser 1.91 (*)    Calcium 8.7 (*)    Albumin 3.2 (*)    AST 83 (*)    GFR calc non Af Amer 26 (*)    GFR calc Af Amer 30 (*)    Anion gap 20 (*)    All other components within normal limits  TROPONIN I - Abnormal; Notable for the following:    Troponin I 0.04 (*)    All other components within normal limits  BLOOD GAS, ARTERIAL - Abnormal; Notable for the following:    pH, Arterial 7.18 (*)    pCO2 arterial 29 (*)    Bicarbonate 10.8 (*)    Acid-base deficit 16.2 (*)    All other components within normal limits  APTT - Abnormal; Notable for the following:    aPTT 64 (*)    All other components within normal limits  PROTIME-INR - Abnormal; Notable for the following:    Prothrombin Time 23.4 (*)    All other components within normal limits  CULTURE, BLOOD (ROUTINE X 2)  CULTURE, BLOOD (ROUTINE X 2)  URINE CULTURE  GLUCOSE, CAPILLARY  VANCOMYCIN, RANDOM  CBG  MONITORING, ED   ____________________________________________  EKG  ED ECG REPORT I, Jene EveryKINNER, Nyala Kirchner, the attending physician, personally viewed and interpreted this ECG.  Date: 08/24/2016  Rhythm: Sinus tachycardia QRS Axis: normal Intervals: Prolonged QTC ST/T Wave abnormalities: normal Narrative Interpretation: Significant tachycardia  ____________________________________________  RADIOLOGY  X-ray does not show pneumonia CT head unremarkable CT angiography of the chest CT renal stone  study ____________________________________________   PROCEDURES  Procedure(s) performed: yes - 2 procedures INTUBATION Performed by: Jene Every  Required items: required blood products, implants, devices, and special equipment available Patient identity confirmed: provided demographic data and hospital-assigned identification number Time out: Immediately prior to procedure a "time out" was called to verify the correct patient, procedure, equipment, support staff and site/side marked as required.  Indications: AMS, airway protection  Intubation method: Glidescope Laryngoscopy   Preoxygenation: Bipap/BVM  Sedatives: 20 Etomidate Paralytic: 100 Succinylcholine  Tube Size: 8.0 cuffed  Post-procedure assessment: chest rise and ETCO2 monitor Breath sounds: equal and absent over the epigastrium Tube secured with: ETT holder Chest x-ray interpreted by radiologist and me.  Chest x-ray findings: endotracheal tube in appropriate position  Patient tolerated the procedure well with no immediate complications.  CENTRAL LINE Performed by: Jene Every Consent: The procedure was performed in an emergent situation. Required items: required blood products, implants, devices, and special equipment available Patient identity confirmed: arm band and provided demographic data Time out: Immediately prior to procedure a "time out" was called to verify the correct patient, procedure,  equipment, support staff and site/side marked as required. Indications: vascular access Anesthesia: local infiltration Local anesthetic: lidocaine 1% with epinephrine Anesthetic total: 3 ml Patient sedated: no Preparation: skin prepped with 2% chlorhexidine Skin prep agent dried: skin prep agent completely dried prior to procedure Sterile barriers: all five maximum sterile barriers used - cap, mask, sterile gown, sterile gloves, and large sterile sheet Hand hygiene: hand hygiene performed prior to central venous catheter insertion  Location details: Right IJ  Catheter type: triple lumen Catheter size: 8 Fr Pre-procedure: landmarks identified Ultrasound guidance: Yes Successful placement: yes Post-procedure: line sutured and dressing applied Assessment: blood return through all parts, free fluid flow, placement verified by x-ray and no pneumothorax on x-ray Patient tolerance: Patient tolerated the procedure well with no immediate complications.     Critical Care performed:yes   CRITICAL CARE Performed by: Jene Every   Total critical care time: 70 minutes  Critical care time was exclusive of separately billable procedures and treating other patients.  Critical care was necessary to treat or prevent imminent or life-threatening deterioration.  Critical care was time spent personally by me on the following activities: development of treatment plan with patient and/or surrogate as well as nursing, discussions with consultants, evaluation of patient's response to treatment, examination of patient, obtaining history from patient or surrogate, ordering and performing treatments and interventions, ordering and review of laboratory studies, ordering and review of radiographic studies, pulse oximetry and re-evaluation of patient's condition.  ____________________________________________   INITIAL IMPRESSION / ASSESSMENT AND PLAN / ED COURSE  Pertinent labs & imaging results that  were available during my care of the patient were reviewed by me and considered in my medical decision making (see chart for details).  Patient presentswith altered mental status, she is tachycardic and tachypneic, pulse oximetry is in the low 90s. She has history of diabetes but glucose is normal. Suspect metabolic encephalopathy likely secondary to infection although she is afebrile, we'll hold on antibiotics until further data available. We will check labs, chest x-ray, urinalysis, ABG give IV fluids and closely monitor  ----------------------------------------- 9:09 AM on 08/24/2016 -----------------------------------------  ABG demonstrates hypoxia patient was started on O2. Lactic still pending, however elevated white blood cell count I'll start the patient on broad-spectrum antibiotics. Etiology of altered mental status/infection is still unknown.    ----------------------------------------- 9:35 AM on 08/24/2016 -----------------------------------------  Notified of lactic acid  of 10.8, 30 MLS per kilogram of fluids ordered. Blood pressure stable at this time. Source remains unclear. I'll send the patient for CT angiography of the chest given her tachycardia and hypoxia and also to look for pneumonia.  ----------------------------------------- 11:15 AM on 08/24/2016 ----------------------------------------- Patient clinically appears worse, her heart rate has increased. We will start her on BiPAP. Husband is here now and reports that she has a history of kidney stones and yesterday was complaining of severe flank pain. This of course raises the concern for infected kidney stone which could explain her severe sepsis. It is unfortunate this information was not known sooner. We will obtain CT renal stone study stat.  Sepsis - Repeat Assessment  Performed at:    1100  Vitals     Blood pressure (95/50), pulse (!) 127, temperature 98.5 F (36.9 C), temperature source Oral, resp. rate (!)  28, height 1.626 m (5\' 4" ), weight 85.8 kg (189 lb 3.2 oz), SpO2 96 %.  Heart:     Tachycardic  Lungs:    Rales  Capillary Refill:   <2 sec  Peripheral Pulse:   Radial pulse palpable  Skin:     Normal Color     ----------------------------------------- 11:54 AM on 08/24/2016 -----------------------------------------  Patient appears to be doing better on BiPAP and is tolerating it well  ----------------------------------------- 12:47 PM on 08/24/2016 -----------------------------------------  Discussed with radiologist who notes impacted 5.5 mm UPJ stone as feared. Discussed with Dr. Griffin Dakin of urology who is coming in to see the patient however he recommends IR for percutaneous nephrostomy because patient is likely not stable for general anesthesia. Discussed with IR attending they will prepare the lab. Discussed with ICU attending to update him on patient status  Patient's blood pressure is trending down I discussed with the family placing central line and possible admission as well given her continued worsening altered mental status  ----------------------------------------- 2:19 PM on 08/24/2016 -----------------------------------------  Right IJ placed by me under ultrasound guidance. Patient intubated by me as noted above.  Patient started on dopamine during central line procedure given worsening blood pressure. Levophed started via central line.  Patient has been seen by urology and interventional radiologist, they're preparing to take the patient to the lab. Family has been updated as to the patient's prognosis  ----------------------------------------- 2:48 PM on 08/24/2016 -----------------------------------------  Patient to lab for her procedure. She is on dopamine and Levophed, Dr. Lowella Dandy has spoken with family            ____________________________________________   FINAL CLINICAL IMPRESSION(S) / ED DIAGNOSES  Final diagnoses:  Flank pain   Kidney stone on left side  Septic shock (HCC)      NEW MEDICATIONS STARTED DURING THIS VISIT:  New Prescriptions   No medications on file     Note:  This document was prepared using Dragon voice recognition software and may include unintentional dictation errors.    Jene Every, MD 08/24/16 [phone]

## 2016-08-24 NOTE — Progress Notes (Signed)
Patient ID: Megan Nixon, female   DOB: 28-Jan-1951, 66 y.o.   MRN: [id number] Consulted by the ED for left hydronephrosis due to a UPJ renal stone.  Patient is hypotensive and septic.  Patient has been intubated and requiring vasopressors.  Reviewed CT images and agree that patient needs renal decompression. PE: Patient is intubated with sedation RRR:  Tachycardiac and difficult to hear heart sounds Lungs:  Bilateral rales Abd:  Soft A:  66 yo female with respiratory failure and likely urosepsis related to an obstructing left renal stone.  Patient will need emergent decompression with a percutaneous nephrostomy tube.  Imaging and labs have been reviewed.  INR is mildly elevated but will not delay procedure.  Asked the ICU physician to plan for FFP after the procedure. P: Plan for left nephrostomy tube with image guidance.  Discussed procedure with family and informed consent obtained from the husband.  Patient is already intubated and sedated.

## 2016-08-24 NOTE — H&P (Addendum)
[facility] - [place] [facility]   PATIENT NAME: Megan Nixon    MR#:  [id number]  DATE OF BIRTH:  07-Jul-1950  DATE OF ADMISSION:  08/24/2016  PRIMARY CARE PHYSICIAN: Danella PentonMiller, Mark F, MD   REQUESTING/REFERRING PHYSICIAN: Jene EveryKinner, Robert, MD  CHIEF COMPLAINT:   Chief Complaint  Patient presents with  . Altered Mental Status   Confusion since last night. HISTORY OF PRESENT ILLNESS:  Megan JacobsonSarah Nixon  is a 66 y.o. female with a known history of Hypertension, diabetes, history of kidney stones and CVA. The patient is confused, unable to provide any information. According to her husband, patient was was found confused last night. He cannot provide any other information. The patient was found tachycardia, hypotension, lactic acidosis. Urinalysis show UTI, CAT scan of the chest also multifocal pneumonia. The patient has respiratory distress with hypoxia, she is put on BiPAP just now. She is given Zosyn and vancomycin in the ED. The patient husband also mentioned that patient has flank pain recently.  PAST MEDICAL HISTORY:   Past Medical History:  Diagnosis Date  . Diabetes mellitus without complication (HCC)   . Hypertension   . Stroke [facility](HCC)     PAST SURGICAL HISTORY:   Past Surgical History:  Procedure Laterality Date  . BREAST EXCISIONAL BIOPSY Bilateral 1971   neg    SOCIAL HISTORY:   Social History  Substance Use Topics  . Smoking status: Unknown If Ever Smoked  . Smokeless tobacco: Not on file  . Alcohol use Not on file    FAMILY HISTORY:   Family History  Problem Relation Age of Onset  . Breast cancer Neg Hx     DRUG ALLERGIES:  No Known Allergies  REVIEW OF SYSTEMS:   Review of Systems  Unable to perform ROS: Mental status change    MEDICATIONS AT HOME:   Prior to Admission medications   Medication Sig Start Date End Date Taking? Authorizing Provider  glimepiride (AMARYL) 1 MG tablet Take 1 mg by mouth daily with breakfast.   06/19/16  Yes [provider]  irbesartan-hydrochlorothiazide (AVALIDE) 300-12.5 MG tablet Take 1 tablet by mouth daily.  06/23/16  Yes [provider]  meloxicam (MOBIC) 15 MG tablet Take 15 mg by mouth daily.   Yes [provider]  metFORMIN (GLUCOPHAGE) 500 MG tablet 500 mg 2 (two) times daily with a meal.  06/23/16  Yes [provider]  verapamil (CALAN-SR) 180 MG CR tablet 180 mg daily.  06/19/16  Yes [provider]      VITAL SIGNS:  Blood pressure (!) 88/55, pulse (!) 122, temperature 98.5 F (36.9 C), temperature source Oral, resp. rate (!) 38, height 5\' 4"  (1.626 m), weight 189 lb 3.2 oz (85.8 kg), SpO2 (!) 86 %.  PHYSICAL EXAMINATION:  Physical Exam  GENERAL:  66 y.o.-year-old patient lying in the bed On BiPAP. Obese. EYES: Pupils equal, round, reactive to light and accommodation. No scleral icterus. Extraocular muscles intact.  HEENT: Head atraumatic, normocephalic.  NECK:  Supple, no jugular venous distention. No thyroid enlargement, no tenderness.  LUNGS: Coarse breath sounds bilaterally, no wheezing, rales,rhonchi or crepitation. No use of accessory muscles of respiration.  CARDIOVASCULAR: S1, S2 normal. No murmurs, rubs, or gallops.  ABDOMEN: Soft, nontender, nondistended. Bowel sounds present. No organomegaly or mass.  EXTREMITIES: No pedal edema, cyanosis, or clubbing.  NEUROLOGIC: Unable to exam.  PSYCHIATRIC: The patient is confused. SKIN: No obvious rash, lesion, or ulcer.   LABORATORY PANEL:  CBC  Recent Labs Lab 08/24/16 0824  WBC 14.8*  HGB 13.0  HCT 39.5  PLT 87*   ------------------------------------------------------------------------------------------------------------------  Chemistries   Recent Labs Lab 08/24/16 0919  NA 138  K 3.5  CL 101  CO2 17*  GLUCOSE 76  BUN 27*  CREATININE 1.91*  CALCIUM 8.7*  AST 83*  ALT 44  ALKPHOS 78  BILITOT 0.9    ------------------------------------------------------------------------------------------------------------------  Cardiac Enzymes  Recent Labs Lab 08/24/16 0919  TROPONINI 0.04*   ------------------------------------------------------------------------------------------------------------------  RADIOLOGY:  Ct Head Wo Contrast  Result Date: 08/24/2016 CLINICAL DATA:  Altered mental status, confusion beginning yesterday, stroke, hypertension, diabetes mellitus EXAM: CT HEAD WITHOUT CONTRAST TECHNIQUE: Contiguous axial images were obtained from the base of the skull through the vertex without intravenous contrast. Sagittal and coronal MPR images reconstructed from axial data set. COMPARISON:  05/07/2014 FINDINGS: Brain: Mild generalized atrophy. Normal ventricular morphology. No midline shift or mass effect. Small old lacunar infarct LEFT thalamus. Minimal small vessel chronic ischemic changes of deep cerebral white matter. No intracranial hemorrhage, mass lesion or evidence of acute infarction. No extra-axial fluid collections. Vascular: Atherosclerotic calcification of internal carotid and vertebral arteries at skullbase Skull: Intact Sinuses/Orbits: Clear Other: N/A IMPRESSION: Atrophy with minimal small vessel chronic ischemic changes of deep cerebral white matter. 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 No acute intracranial abnormalities. Electronically Signed   By: Ulyses Southward M.D.   On: 08/24/2016 09:17   Dg Chest Port 1 View  Result Date: 08/24/2016 CLINICAL DATA:  Altered mental status, confusion, prior stroke, hypertension, diabetes mellitus EXAM: PORTABLE CHEST 1 VIEW COMPARISON:  Portable exam 0734 hours without priors for comparison FINDINGS: Enlargement of cardiac silhouette with pulmonary vascular congestion. Mild RIGHT basilar atelectasis. Upper lungs clear. No pleural effusion or pneumothorax. Bones demineralized. IMPRESSION: Enlargement of cardiac silhouette with minimal pulmonary  vascular congestion. RIGHT basilar atelectasis. Electronically Signed   By: Ulyses Southward M.D.   On: 08/24/2016 07:58      IMPRESSION AND PLAN:  Acute respiratory failure with hypoxia due to multifocal pneumonia and sepsis The patient will be admitted to ICU. Continue BiPAP, intensivist consult. Intubation when necessary.  Septic shock due to multifocal pneumonia and UTI. Continue IV antibiotics, follow-up CBC and cultures. IV fluid support. Hold hypertension medication.  Lactic acidosis. Continue treatment as above, follow-up lactic acid level.  Acute renal failure. Continue IV fluid support and follow-up BMP.  Flank pain. Follow-up CAT scan of abdomen and pelvis showed nephrolithiasis with hydronephrosis. Follow-up urologist and radiologist for PCN.  Elevated troponin, due to demanding ischemia secondary to above. Follow-up troponin level.  Acute metabolic encephalopathy due to above. Aspiration precaution.  Thrombocytopenia. Possible due to sepsis. Follow-up CBC.  Diabetes. Hold glipizide, sliding scale.  All the records are reviewed and case discussed with ED provider. Management plans discussed with the patient's husband and they are in agreement.  CODE STATUS: Full code  TOTAL CRITICAL TIME TAKING CARE OF THIS PATIENT: 62 minutes.    Shaune Pollack M.D on 08/24/2016 at 11:44 AM  Between 7am to 6pm - Pager - [phone]  After 6pm go to [email or web address] - Social research officer, government  [facility] [place] [facility]  Office  [phone]  CC: Primary care physician; Danella Penton, MD   Note: This dictation was prepared with Dragon dictation along with smaller phrase technology. Any transcriptional errors that result from this process are unintentional.

## 2016-08-24 NOTE — Consult Note (Addendum)
Service Requesting Consultation: [place] ED, Dr. Corky Downs  Service Providing Consultation:  Urology, Dr. Heron Sabins   SUBJECTIVE:  CC:  Infected ureteral stone  HPI: Megan Nixon is a 66 y.o. female presenting to the [facility] ED with altered mental status. Her husband relayed a history of left flank pain starting yesterday as her only preceding symptoms. She took 2 oxycodone pills per his report yesterday to help with the pain. Today he noted she was acting strangely, so brought her to the ED.   +history of stones. Required ESWL about 3 years ago. Has a history of negative workup for microscopic hematuria (renal sonogram 2012 - no stones seen).   In the ED she was noted to be tachycardic with a lactate of 10. She was started on vanc and zosyn and imaging ordered. CT of her abdomen demonstrated a 68m left UPJ stone with upstream hydronephrosis and perirenal fat stranding.  She was started on BiPAP and eventually intubated due to CV instability, agitation and respiratory distress.  Past Medical History:  Diagnosis Date  . [place]   . Hypertension   . Stroke ([facility]   :  Past Surgical History:  Procedure Laterality Date  . BREAST EXCISIONAL BIOPSY Bilateral 1971   neg  :  No Known Allergies:  Medications: alcohol swabs PadM, Apply 1 each topically once daily., Taking aspirin 81 MG EC tablet, Take 81 mg by mouth once daily., Taking blood glucose control high,low Soln, Use 1 each as directed. Accu-chek Aviva Solution. Diagnosis: E11.9, Taking blood glucose diagnostic test strip, Use once daily. Accu-check aviva test strips. DX: E11.9, Taking blood glucose meter kit, Use as directed. Accu-chek Aviva Plus Glucometer. Diagnosis E11.9, Taking cyanocobalamin/cobamamide (B12 SL), Place 1,000 mcg under the tongue once daily., Taking glimepiride (AMARYL) 1 MG tablet, TAKE 1 TABLET EVERY DAY WITH BREAKFAST, Taking hydrocortisone (ANUSOL-HC) 25 mg  suppository, Place 25 mg rectally 3 (three) times daily as needed. , Taking irbesartan-hydrochlorothiazide (AVALIDE) 300-12.5 mg tablet, TAKE 1 TABLET EVERY DAY, Taking lancets, Use 1 each once daily. Softclix Lancet. DX E11.9, Taking metFORMIN (GLUCOPHAGE) 500 MG tablet, TAKE 2 TABLETS TWICE DAILY WITH MEALS, Taking verapamil (CALAN-SR) 180 MG SR tablet, TAKE 1 TABLET TWICE DAILY, Taking    Social History: Patient is married.    Social History  Substance Use Topics  . Smoking status: Unknown If Ever Smoked  . Smokeless tobacco: Not on file  . Alcohol use Not on file  .   Denies tobacco, EtOH, and illicit drug use.  Family History  Problem Relation Age of Onset  . Breast cancer Neg Hx     OBJECTIVE:   Review of Systems  Unable to complete as patient is currently being intubated for worsening mental and hemodynamic status.   PHYSICAL EXAM:  Gen: Patient is on BiPAP in moderate distress. BP (!) 77/43   Pulse (!) 125   Temp 98.5 F (36.9 C) (Oral)   Resp (!) 41   Ht '5\' 4"'$  (1.626 m)   Wt 85.8 kg (189 lb 3.2 oz)   SpO2 94%   BMI 32.48 kg/m  Mouth: Oropharynx nl. Neck: No JVD.  No thyroidmegaly. Card: tachycardic with RR. Pulm: tachypnea with normal effort. Back: Difficult to assess CVAT 2/2 mental status Abdomen: BSx4.  No surgical scars.  No masses,no hernias noted.  No heptaosplenomegaly.     GU(Female): Introitus normal.  Urethra is normal.   LABS: Lab Results  Component Value Date   CREATININE 1.91 (H) 08/24/2016  Lab Results  Component Value Date   WBC 14.8 (H) 08/24/2016   HGB 13.0 08/24/2016   HCT 39.5 08/24/2016   MCV 88.9 08/24/2016   PLT 87 (L) 08/24/2016   Lactic Acid, Venous    Component Value Date/Time   LATICACIDVEN 8.5 (HH) 08/24/2016 1201    IMAGING: CT A/P: FINDINGS: Lower chest: Persistent bibasilar atelectasis. Small right pleural effusion. The heart is mildly enlarged. Three-vessel coronary artery calcifications are noted.  The distal esophagus is grossly normal.  Hepatobiliary: Suspect cirrhotic changes involving the liver with portal venous hypertension and portal venous collaterals. No focal hepatic lesion. Gallstones are noted the gallbladder.  Pancreas: Grossly normal without contrast.  Spleen: Normal size.  Adrenals/Urinary Tract: The adrenal glands are grossly normal.  High-grade obstruction of the left kidney due to a 5.5 x 5.0 mm UPJ calculus. Perinephric interstitial changes and fluid are noted. I do not see any contrast below the calculus. The right kidney is unremarkable. No ureteral dilatation. The bladder is filled with contrast and appears normal. There is a left-sided bladder diverticulum noted.  Stomach/Bowel: The stomach, duodenum, small bowel and colon are grossly normal. No obvious mass lesions or obstructive findings.  Vascular/Lymphatic: Scattered distal aortic and iliac artery calcifications. No focal aneurysm. Numerous small upper abdominal lymph nodes likely related to cirrhosis. No retroperitoneal adenopathy.  Reproductive: The uterus is surgically absent. I believe the right ovary is still present and appears normal.  Other: Small amount of free abdominal/pelvic fluid. No pelvic mass or adenopathy. No inguinal mass or adenopathy.  Musculoskeletal: No significant bony findings. Advanced degenerative changes involving the spine.  IMPRESSION: 1. 5.5 x 5.0 mm left UPJ calculus with high-grade obstructive findings. 2. Normal right kidney and ureter. The bladder is unremarkable except for a left-sided diverticulum. 3. Suspect cirrhotic changes involving the liver with portal venous hypertension and portal venous collaterals. 4. Cholelithiasis. Suspect gallbladder wall thickening which may be due to cirrhosis. 5. Moderate atherosclerotic calcifications involving the distal aorta and iliac arteries and coronary arteries.  ASSESSMENT:   66 y.o. old female  with urosepsis secondary to an obstructing left UPJ stone. Given her clinical picture (AMS, Lactate 10, respiratory distress) recommend consultation with IR for emergent left percutaneous nephrostomy tube placement for source control.  Ureteral stenting is a poor option in patients with sepsis due to the need for general anesthesia and the increased risk of failing to achieve source control. Definitive stone treatment can be planned after source control and appropriate treatment of her infection.   PLAN:  1. IR for left PCN 2.  ICU admission following PCN placement 3. Broad spectrum antibiotics until cultures mature 4. Plan for at least 14 day course of antibiotics for complicated UTI  5. Can plan definitive stone treatment once infection has been treated (between 2 and 6 weeks post PCN placement in most cases).

## 2016-08-24 NOTE — Progress Notes (Signed)
Pt. Was transported to CCU from vascular while on the vent.

## 2016-08-24 NOTE — Progress Notes (Signed)
eLink Physician-Brief Progress Note Patient Name: Megan FerdinandSarah S Ihde DOB: January 18, 1951 MRN: [id number]   Date of Service  08/24/2016  HPI/Events of Note  Severe acidemia from septic shock.    eICU Interventions  Push 1 amp sodium bicarb Bicarb drip Serial abg     Intervention Category Major Interventions: Acid-Base disturbance - evaluation and management  Henry RusselSMITH, Laurieann Friddle, P 08/24/2016, 4:54 PM

## 2016-08-24 NOTE — ED Notes (Signed)
Pt took directly to [facility] for procedure prior to room placement in ICU. Pt accompanied by Gustavus MessingHunter Ore, RN due to cardiac drips.

## 2016-08-24 NOTE — Progress Notes (Signed)
ANTIBIOTIC CONSULT NOTE - INITIAL  Pharmacy Consult for Azithromycin  Indication: pneumonia  No Known Allergies  Patient Measurements: Height: 5\' 8"  (172.7 cm) Weight: 197 lb 8.5 oz (89.6 kg) IBW/kg (Calculated) : 63.9 Adjusted Body Weight:   Vital Signs: Temp: 99.9 F (37.7 C) (07/01 1547) Temp Source: Axillary (07/01 1547) BP: 137/68 (07/01 1650) Pulse Rate: 125 (07/01 1650) Intake/Output from previous day: No intake/output data recorded. Intake/Output from this shift: No intake/output data recorded.  Labs:  Recent Labs  08/24/16 0824 08/24/16 0919 08/24/16 1600  WBC 14.8*  --  23.4*  HGB 13.0  --  11.1*  PLT 87*  --  69*  CREATININE  --  1.91*  --    Estimated Creatinine Clearance: 33 mL/min (A) (by C-G formula based on SCr of 1.91 mg/dL (H)).  Recent Labs  08/24/16 1600  VANCORANDOM 15     Microbiology: No results found for this or any previous visit (from the past 720 hour(s)).  Medical History: Past Medical History:  Diagnosis Date  . Diabetes mellitus without complication (HCC)   . Hypertension   . Stroke [facility](HCC)     Medications:  Prescriptions Prior to Admission  Medication Sig Dispense Refill Last Dose  . glimepiride (AMARYL) 1 MG tablet Take 1 mg by mouth daily with breakfast.    08/23/2016 at 0800  . irbesartan-hydrochlorothiazide (AVALIDE) 300-12.5 MG tablet Take 1 tablet by mouth daily.    08/23/2016 at 0800  . meloxicam (MOBIC) 15 MG tablet Take 15 mg by mouth daily.   08/23/2016 at 0800  . metFORMIN (GLUCOPHAGE) 500 MG tablet 500 mg 2 (two) times daily with a meal.    08/23/2016 at 1800  . verapamil (CALAN-SR) 180 MG CR tablet 180 mg daily.    08/23/2016 at 0800   Assessment: CrCl = 33 ml/min  Goal of Therapy:  resolution of infection  Plan:  Expected duration 7 days with resolution of temperature and/or normalization of WBC   Pt also on Vanc and Zosyn.  Spoke with Dr Katrinka BlazingSmith, he doubts any added benefit from Azithromycin but will  continue until culture reports are back.  Azithromycin 500 mg IV X 1 given on 7/1. Will continue azithromycin 500 mg IV Q24H to start on 7/2 @ 18:00.   Megan Nixon D 08/24/2016,5:08 PM

## 2016-08-24 NOTE — Consult Note (Signed)
PULMONARY / CRITICAL CARE MEDICINE   Name: Megan Nixon MRN: [id number] DOB: August 04, 1950    ADMISSION DATE:  08/24/2016   Cc: Septic shock   HISTORY OF PRESENT ILLNESS:   31F with PMH significant for DM and HTN presented to hospital with altered mental status and hypotension. Underwent imaging which shows left UPJ calculus. Patient intubated in ER for respiratory failure. Underwent IR guided perc nephrostomy tube placement. On vasopressors and sedated at time of my eval. No further history can be obtained.  PAST MEDICAL HISTORY :   has a past medical history of Diabetes mellitus without complication (HCC); Hypertension; and Stroke [facility]).    has a past surgical history that includes Breast excisional biopsy (Bilateral, 1971).   Prior to Admission medications   Medication Sig Start Date End Date Taking? Authorizing Provider  glimepiride (AMARYL) 1 MG tablet Take 1 mg by mouth daily with breakfast.  06/19/16  Yes [provider]  irbesartan-hydrochlorothiazide (AVALIDE) 300-12.5 MG tablet Take 1 tablet by mouth daily.  06/23/16  Yes [provider]  meloxicam (MOBIC) 15 MG tablet Take 15 mg by mouth daily.   Yes [provider]  metFORMIN (GLUCOPHAGE) 500 MG tablet 500 mg 2 (two) times daily with a meal.  06/23/16  Yes [provider]  verapamil (CALAN-SR) 180 MG CR tablet 180 mg daily.  06/19/16  Yes [provider]   No Known Allergies  ROS/SH/FH : cannot be obtained 2 to patient factors.  VITAL SIGNS: Temp:  [98.1 F (36.7 C)-98.5 F (36.9 C)] 98.1 F (36.7 C) (07/01 1438) Pulse Rate:  [42-140] 125 (07/01 1500) Resp:  [18-44] 18 (07/01 1500) BP: (47-158)/(31-91) 85/42 (07/01 1500) SpO2:  [86 %-100 %] 94 % (07/01 1445) FiO2 (%):  [80 %-100 %] 100 % (07/01 1500) Weight:  [189 lb 3.2 oz (85.8 kg)] 189 lb 3.2 oz (85.8 kg) (07/01 0725) HEMODYNAMICS:   VENTILATOR SETTINGS: Vent Mode: AC FiO2 (%):  [80 %-100 %] 100 % Set Rate:  [20  bmp] 20 bmp Vt Set:  [500 mL] 500 mL PEEP:  [5 cmH20] 5 cmH20 INTAKE / OUTPUT: No intake or output data in the 24 hours ending 08/24/16 1531  PHYSICAL EXAMINATION: General:  Intubated, sedated Neuro:  sedated HEENT:  ett+ Cardiovascular:  S1s2+, tachycardia Lungs:  B/l crackles + Abdomen:  Soft, nontender, left perc nephrostomy drain +   LABS:  CBC  Recent Labs Lab 08/24/16 0824  WBC 14.8*  HGB 13.0  HCT 39.5  PLT 87*   Coag's  Recent Labs Lab 08/24/16 1317  APTT 64*  INR 2.05   BMET  Recent Labs Lab 08/24/16 0919  NA 138  K 3.5  CL 101  CO2 17*  BUN 27*  CREATININE 1.91*  GLUCOSE 76   Electrolytes  Recent Labs Lab 08/24/16 0919  CALCIUM 8.7*   Sepsis Markers  Recent Labs Lab 08/24/16 0824 08/24/16 1201  LATICACIDVEN 10.6* 8.5*   ABG  Recent Labs Lab 08/24/16 0732 08/24/16 1236  PHART 7.34* 7.18*  PCO2ART 26* 29*  PO2ART 67* 94   Liver Enzymes  Recent Labs Lab 08/24/16 0919  AST 83*  ALT 44  ALKPHOS 78  BILITOT 0.9  ALBUMIN 3.2*   Cardiac Enzymes  Recent Labs Lab 08/24/16 0919  TROPONINI 0.04*   Glucose  Recent Labs Lab 08/24/16 0824  GLUCAP 89    Imaging Ct Head Wo Contrast  Result Date: 08/24/2016 CLINICAL DATA:  Altered mental status, confusion beginning yesterday, stroke, hypertension,  diabetes mellitus EXAM: CT HEAD WITHOUT CONTRAST TECHNIQUE: Contiguous axial images were obtained from the base of the skull through the vertex without intravenous contrast. Sagittal and coronal MPR images reconstructed from axial data set. COMPARISON:  05/07/2014 FINDINGS: Brain: Mild generalized atrophy. Normal ventricular morphology. No midline shift or mass effect. Small old lacunar infarct LEFT thalamus. Minimal small vessel chronic ischemic changes of deep cerebral white matter. No intracranial hemorrhage, mass lesion or evidence of acute infarction. No extra-axial fluid collections. Vascular: Atherosclerotic calcification of  internal carotid and vertebral arteries at skullbase Skull: Intact Sinuses/Orbits: Clear Other: N/A IMPRESSION: Atrophy with minimal small vessel chronic ischemic changes of deep cerebral white matter. 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 No acute intracranial abnormalities. Electronically Signed   By: Ulyses Southward M.D.   On: 08/24/2016 09:17   Ct Angio Chest Pe W And/or Wo Contrast  Result Date: 08/24/2016 CLINICAL DATA:  Hypoxia, tachycardia EXAM: CT ANGIOGRAPHY CHEST WITH CONTRAST TECHNIQUE: Multidetector CT imaging of the chest was performed using the standard protocol during bolus administration of intravenous contrast. Multiplanar CT image reconstructions and MIPs were obtained to evaluate the vascular anatomy. CONTRAST:  60 cc Isovue 370 IV COMPARISON:  08/24/2016 FINDINGS: Cardiovascular: Cardiomegaly. No evidence of aortic aneurysm. No filling defects in the pulmonary arteries to suggest pulmonary emboli. Diffuse coronary artery calcifications. Mediastinum/Nodes: No mediastinal, hilar, or axillary adenopathy. Lungs/Pleura: Vascular congestion. Bibasilar atelectasis. No pleural effusions. Upper Abdomen: Imaging into the upper abdomen shows no acute findings. Musculoskeletal: Chest wall soft tissues are unremarkable. No acute bony abnormality. Review of the MIP images confirms the above findings. IMPRESSION: No evidence of pulmonary embolus. Cardiomegaly, coronary artery disease. Pulmonary vascular congestion. Bibasilar atelectasis. No evidence of pulmonary embolus. Electronically Signed   By: Charlett Nose M.D.   On: 08/24/2016 11:41   Dg Chest Portable 1 View  Result Date: 08/24/2016 CLINICAL DATA:  Central line placement and intubation. EXAM: PORTABLE CHEST 1 VIEW COMPARISON:  CT of the chest 08/24/2016 FINDINGS: Patient is intubated. Endotracheal tube terminates 2 cm above the carina. The right internal jugular approach central venous catheter terminates in the expected location of the right atrium.  Cardiomediastinal silhouette is enlarged, exaggerated by AP portable technique. There is no evidence of focal airspace consolidation, or pneumothorax. There is a possible left pleural effusion. Low lung volumes. Osseous structures are without acute abnormality. Soft tissues are grossly normal. IMPRESSION: Endotracheal tube terminates 2 cm above the carina. Right internal jugular approach central venous catheter tip overlies the expected location of the right atrium. The cardiac silhouette appears enlarged, which may be exaggerated by AP portable technique. Possible left pleural effusion versus prominent pericardial fat. Given recent instrumentation, decubital radiograph, left side down, should be considered to further evaluate whether or not there is left pleural fluid. Electronically Signed   By: Ted Mcalpine M.D.   On: 08/24/2016 14:30   Dg Chest Port 1 View  Result Date: 08/24/2016 CLINICAL DATA:  Altered mental status, confusion, prior stroke, hypertension, diabetes mellitus EXAM: PORTABLE CHEST 1 VIEW COMPARISON:  Portable exam 0734 hours without priors for comparison FINDINGS: Enlargement of cardiac silhouette with pulmonary vascular congestion. Mild RIGHT basilar atelectasis. Upper lungs clear. No pleural effusion or pneumothorax. Bones demineralized. IMPRESSION: Enlargement of cardiac silhouette with minimal pulmonary vascular congestion. RIGHT basilar atelectasis. Electronically Signed   By: Ulyses Southward M.D.   On: 08/24/2016 07:58   Ct Renal Stone Study  Result Date: 08/24/2016 CLINICAL DATA:  Abdominal pain and UTI. EXAM: CT ABDOMEN AND  PELVIS WITHOUT CONTRAST TECHNIQUE: Multidetector CT imaging of the abdomen and pelvis was performed following the standard protocol without IV contrast. COMPARISON:  Chest CT, same date. FINDINGS: Lower chest: Persistent bibasilar atelectasis. Small right pleural effusion. The heart is mildly enlarged. Three-vessel coronary artery calcifications are noted. The  distal esophagus is grossly normal. Hepatobiliary: Suspect cirrhotic changes involving the liver with portal venous hypertension and portal venous collaterals. No focal hepatic lesion. Gallstones are noted the gallbladder. Pancreas: Grossly normal without contrast. Spleen: Normal size. Adrenals/Urinary Tract: The adrenal glands are grossly normal. High-grade obstruction of the left kidney due to a 5.5 x 5.0 mm UPJ calculus. Perinephric interstitial changes and fluid are noted. I do not see any contrast below the calculus. The right kidney is unremarkable. No ureteral dilatation. The bladder is filled with contrast and appears normal. There is a left-sided bladder diverticulum noted. Stomach/Bowel: The stomach, duodenum, small bowel and colon are grossly normal. No obvious mass lesions or obstructive findings. Vascular/Lymphatic: Scattered distal aortic and iliac artery calcifications. No focal aneurysm. Numerous small upper abdominal lymph nodes likely related to cirrhosis. No retroperitoneal adenopathy. Reproductive: The uterus is surgically absent. I believe the right ovary is still present and appears normal. Other: Small amount of free abdominal/pelvic fluid. No pelvic mass or adenopathy. No inguinal mass or adenopathy. Musculoskeletal: No significant bony findings. Advanced degenerative changes involving the spine. IMPRESSION: 1. 5.5 x 5.0 mm left UPJ calculus with high-grade obstructive findings. 2. Normal right kidney and ureter. The bladder is unremarkable except for a left-sided diverticulum. 3. Suspect cirrhotic changes involving the liver with portal venous hypertension and portal venous collaterals. 4. Cholelithiasis. Suspect gallbladder wall thickening which may be due to cirrhosis. 5. Moderate atherosclerotic calcifications involving the distal aorta and iliac arteries and coronary arteries. Electronically Signed   By: Rudie MeyerP.  Gallerani M.D.   On: 08/24/2016 12:44      ASSESSMENT: SEPTIC SHOCK ACUTE  KIDNEY INJURY WITH UTI ENCEPHALOPATHY ACUTE RESP FAILURE LACTIC ACIDOSIS COAGULOAPTHY THROMBOCYTOPENIA  PLAN: Vasopressors for MAP ~70. Broad spectrum antibiotics. Echo pending. Check cortisol. Stress dose steroids. Pan-culture. Monitor UOP and creatinine closely. IVF. Will transfuse FFP. Monitor mental status closely.  Repeat labs. DVT/GI/VAP prophylaxis. Full Code.   Cc: 32 minutes    Dorothyann GibbsPrag Aylani Spurlock MD Pulmonary and Critical Care Medicine    08/24/2016, 3:31 PM

## 2016-08-24 NOTE — Progress Notes (Signed)
Pt. Was transported to CT from ED and back while on BIPAP.

## 2016-08-24 NOTE — Progress Notes (Signed)
PHARMACY - PHYSICIAN COMMUNICATION CRITICAL VALUE ALERT - BLOOD CULTURE IDENTIFICATION (BCID)  Results for orders placed or performed during the hospital encounter of 08/24/16  Blood Culture ID Panel (Reflexed) (Collected: 08/24/2016  8:25 AM)  Result Value Ref Range   Enterococcus species NOT DETECTED NOT DETECTED   Listeria monocytogenes NOT DETECTED NOT DETECTED   Staphylococcus species NOT DETECTED NOT DETECTED   Staphylococcus aureus NOT DETECTED NOT DETECTED   Streptococcus species NOT DETECTED NOT DETECTED   Streptococcus agalactiae NOT DETECTED NOT DETECTED   Streptococcus pneumoniae NOT DETECTED NOT DETECTED   Streptococcus pyogenes NOT DETECTED NOT DETECTED   Acinetobacter baumannii NOT DETECTED NOT DETECTED   Enterobacteriaceae species DETECTED (A) NOT DETECTED   Enterobacter cloacae complex NOT DETECTED NOT DETECTED   Escherichia coli NOT DETECTED NOT DETECTED   Klebsiella oxytoca NOT DETECTED NOT DETECTED   Klebsiella pneumoniae DETECTED (A) NOT DETECTED   Proteus species NOT DETECTED NOT DETECTED   Serratia marcescens NOT DETECTED NOT DETECTED   Carbapenem resistance NOT DETECTED NOT DETECTED   Haemophilus influenzae NOT DETECTED NOT DETECTED   Neisseria meningitidis NOT DETECTED NOT DETECTED   Pseudomonas aeruginosa NOT DETECTED NOT DETECTED   Candida albicans NOT DETECTED NOT DETECTED   Candida glabrata NOT DETECTED NOT DETECTED   Candida krusei NOT DETECTED NOT DETECTED   Candida parapsilosis NOT DETECTED NOT DETECTED   Candida tropicalis NOT DETECTED NOT DETECTED    Name of physician (or Provider) Contacted: M. Tukov  Changes to prescribed antibiotics required: Yes, Will d/c Azithromycin and Vancomycin , continue Zosyn.   Danyal Adorno D 08/24/2016  7:49 PM

## 2016-08-24 NOTE — ED Notes (Signed)
CT called stating the pt was combative and that they were unable to keep the pt still for the test. MD Kinner notified and the Renetta ChalkStephanie, Charge RN to CT for medication admin.

## 2016-08-24 NOTE — Procedures (Signed)
  Pre-operative Diagnosis: Obstructing stone at left UPJ      Post-operative Diagnosis: Obstructing stone at left UPJ   Indications: Sepsis  Procedure: Left percutaneous nephrostomy tube placement  Findings: Mild-moderate left hydronephrosis.  Contrast still in collecting system from CT earlier today.  Nephrostomy placed thru mid/lower pole calyx.  Complications: None     EBL: Minimal  Plan: Follow output.  Patient is going immediately to ICU.

## 2016-08-25 ENCOUNTER — Other Ambulatory Visit: Payer: 59

## 2016-08-25 ENCOUNTER — Inpatient Hospital Stay: Payer: Medicare PPO

## 2016-08-25 ENCOUNTER — Inpatient Hospital Stay
Admit: 2016-08-25 | Discharge: 2016-08-25 | Disposition: A | Discharge status: Home / Self Care | Payer: Medicare PPO | Attending: Pulmonary Disease | Admitting: Pulmonary Disease

## 2016-08-25 DIAGNOSIS — A419 Sepsis, unspecified organism: Secondary | ICD-10-CM

## 2016-08-25 DIAGNOSIS — N12 Tubulo-interstitial nephritis, not specified as acute or chronic: Secondary | ICD-10-CM

## 2016-08-25 DIAGNOSIS — Z4659 Encounter for fitting and adjustment of other gastrointestinal appliance and device: Secondary | ICD-10-CM

## 2016-08-25 DIAGNOSIS — R7881 Bacteremia: Secondary | ICD-10-CM

## 2016-08-25 DIAGNOSIS — N179 Acute kidney failure, unspecified: Secondary | ICD-10-CM

## 2016-08-25 DIAGNOSIS — N200 Calculus of kidney: Secondary | ICD-10-CM

## 2016-08-25 DIAGNOSIS — N1330 Unspecified hydronephrosis: Secondary | ICD-10-CM

## 2016-08-25 DIAGNOSIS — R6521 Severe sepsis with septic shock: Secondary | ICD-10-CM

## 2016-08-25 DIAGNOSIS — R109 Unspecified abdominal pain: Secondary | ICD-10-CM

## 2016-08-25 DIAGNOSIS — J9690 Respiratory failure, unspecified, unspecified whether with hypoxia or hypercapnia: Secondary | ICD-10-CM

## 2016-08-25 DIAGNOSIS — D65 Disseminated intravascular coagulation [defibrination syndrome]: Secondary | ICD-10-CM

## 2016-08-25 LAB — MAGNESIUM
MAGNESIUM: 2 mg/dL (ref 1.7–2.4)
MAGNESIUM: 1.9 mg/dL (ref 1.7–2.4)
Magnesium: 2.3 mg/dL (ref 1.7–2.4)

## 2016-08-25 LAB — CBC WITH DIFFERENTIAL/PLATELET
BASOS PCT: 0 %
Band Neutrophils: 17 %
Basophils Absolute: 0 10*3/uL (ref 0–0.1)
Blasts: 0 %
Eosinophils Absolute: 0 10*3/uL (ref 0–0.7)
Eosinophils Relative: 0 %
HCT: 32.9 % — ABNORMAL LOW (ref 35.0–47.0)
Hemoglobin: 11.3 g/dL — ABNORMAL LOW (ref 12.0–16.0)
LYMPHS ABS: 3.5 10*3/uL (ref 1.0–3.6)
LYMPHS PCT: 11 %
MCH: 30.4 pg (ref 26.0–34.0)
MCHC: 34.3 g/dL (ref 32.0–36.0)
MCV: 88.6 fL (ref 80.0–100.0)
MONO ABS: 1.6 10*3/uL — AB (ref 0.2–0.9)
MYELOCYTES: 0 %
Metamyelocytes Relative: 18 %
Monocytes Relative: 5 %
NEUTROS PCT: 49 %
NRBC: 1 /100{WBCs} — AB
Neutro Abs: 27.1 10*3/uL — ABNORMAL HIGH (ref 1.4–6.5)
OTHER: 0 %
PLATELETS: 32 10*3/uL — AB (ref 150–440)
Promyelocytes Absolute: 0 %
RBC: 3.71 MIL/uL — ABNORMAL LOW (ref 3.80–5.20)
RDW: 16.7 % — AB (ref 11.5–14.5)
WBC: 32.2 10*3/uL — ABNORMAL HIGH (ref 3.6–11.0)

## 2016-08-25 LAB — BPAM FFP
Blood Product Expiration Date: 201807062359
ISSUE DATE / TIME: 201807011738
Unit Type and Rh: 2800

## 2016-08-25 LAB — BLOOD GAS, ARTERIAL
Acid-base deficit: 5.6 mmol/L — ABNORMAL HIGH (ref 0.0–2.0)
Bicarbonate: 19.3 mmol/L — ABNORMAL LOW (ref 20.0–28.0)
FIO2: 0.7
LHR: 20 {breaths}/min
MECHVT: 500 mL
O2 Saturation: 94.1 %
PEEP/CPAP: 5 cmH2O
Patient temperature: 37
pCO2 arterial: 35 mmHg (ref 32.0–48.0)
pH, Arterial: 7.35 (ref 7.350–7.450)
pO2, Arterial: 75 mmHg — ABNORMAL LOW (ref 83.0–108.0)

## 2016-08-25 LAB — GLUCOSE, CAPILLARY
GLUCOSE-CAPILLARY: 107 mg/dL — AB (ref 65–99)
GLUCOSE-CAPILLARY: 131 mg/dL — AB (ref 65–99)
GLUCOSE-CAPILLARY: 138 mg/dL — AB (ref 65–99)
GLUCOSE-CAPILLARY: 190 mg/dL — AB (ref 65–99)
GLUCOSE-CAPILLARY: 193 mg/dL — AB (ref 65–99)
GLUCOSE-CAPILLARY: 196 mg/dL — AB (ref 65–99)
GLUCOSE-CAPILLARY: 202 mg/dL — AB (ref 65–99)
GLUCOSE-CAPILLARY: 221 mg/dL — AB (ref 65–99)
GLUCOSE-CAPILLARY: 268 mg/dL — AB (ref 65–99)
GLUCOSE-CAPILLARY: 271 mg/dL — AB (ref 65–99)
Glucose-Capillary: 101 mg/dL — ABNORMAL HIGH (ref 65–99)
Glucose-Capillary: 176 mg/dL — ABNORMAL HIGH (ref 65–99)
Glucose-Capillary: 205 mg/dL — ABNORMAL HIGH (ref 65–99)
Glucose-Capillary: 231 mg/dL — ABNORMAL HIGH (ref 65–99)
Glucose-Capillary: 235 mg/dL — ABNORMAL HIGH (ref 65–99)
Glucose-Capillary: 190 mg/dL — ABNORMAL HIGH (ref 65–99)
Glucose-Capillary: 195 mg/dL — ABNORMAL HIGH (ref 65–99)
Glucose-Capillary: 175 mg/dL — ABNORMAL HIGH (ref 65–99)
Glucose-Capillary: 166 mg/dL — ABNORMAL HIGH (ref 65–99)
Glucose-Capillary: 171 mg/dL — ABNORMAL HIGH (ref 65–99)
Glucose-Capillary: 149 mg/dL — ABNORMAL HIGH (ref 65–99)
Glucose-Capillary: 155 mg/dL — ABNORMAL HIGH (ref 65–99)

## 2016-08-25 LAB — CORTISOL: Cortisol, Plasma: 35.9 ug/dL

## 2016-08-25 LAB — RENAL FUNCTION PANEL
ANION GAP: 15 (ref 5–15)
Albumin: 2.3 g/dL — ABNORMAL LOW (ref 3.5–5.0)
Albumin: 2.4 g/dL — ABNORMAL LOW (ref 3.5–5.0)
Anion gap: 13 (ref 5–15)
BUN: 38 mg/dL — ABNORMAL HIGH (ref 6–20)
BUN: 35 mg/dL — ABNORMAL HIGH (ref 6–20)
CHLORIDE: 99 mmol/L — AB (ref 101–111)
CO2: 29 mmol/L (ref 22–32)
CO2: 29 mmol/L (ref 22–32)
CREATININE: 2.63 mg/dL — AB (ref 0.44–1.00)
Calcium: 6.1 mg/dL — CL (ref 8.9–10.3)
Calcium: 6.7 mg/dL — ABNORMAL LOW (ref 8.9–10.3)
Chloride: 98 mmol/L — ABNORMAL LOW (ref 101–111)
Creatinine, Ser: 2.82 mg/dL — ABNORMAL HIGH (ref 0.44–1.00)
GFR calc Af Amer: 19 mL/min — ABNORMAL LOW (ref >60)
GFR calc non Af Amer: 16 mL/min — ABNORMAL LOW (ref >60)
GFR, EST AFRICAN AMERICAN: 20 mL/min — AB (ref >60)
GFR, EST NON AFRICAN AMERICAN: 18 mL/min — AB (ref >60)
GLUCOSE: 183 mg/dL — AB (ref 65–99)
Glucose, Bld: 153 mg/dL — ABNORMAL HIGH (ref 65–99)
PHOSPHORUS: 3.8 mg/dL (ref 2.5–4.6)
POTASSIUM: 3.1 mmol/L — AB (ref 3.5–5.1)
POTASSIUM: 3.5 mmol/L (ref 3.5–5.1)
Phosphorus: 4 mg/dL (ref 2.5–4.6)
Sodium: 142 mmol/L (ref 135–145)
Sodium: 141 mmol/L (ref 135–145)

## 2016-08-25 LAB — COMPREHENSIVE METABOLIC PANEL
ALBUMIN: 2.3 g/dL — AB (ref 3.5–5.0)
ALT: 48 U/L (ref 14–54)
AST: 139 U/L — AB (ref 15–41)
Alkaline Phosphatase: 60 U/L (ref 38–126)
Anion gap: 27 — ABNORMAL HIGH (ref 5–15)
BILIRUBIN TOTAL: 0.9 mg/dL (ref 0.3–1.2)
BUN: 37 mg/dL — AB (ref 6–20)
CO2: 18 mmol/L — ABNORMAL LOW (ref 22–32)
Calcium: 6.2 mg/dL — CL (ref 8.9–10.3)
Chloride: 95 mmol/L — ABNORMAL LOW (ref 101–111)
Creatinine, Ser: 2.96 mg/dL — ABNORMAL HIGH (ref 0.44–1.00)
GFR calc Af Amer: 18 mL/min — ABNORMAL LOW (ref >60)
GFR calc non Af Amer: 15 mL/min — ABNORMAL LOW (ref >60)
GLUCOSE: 284 mg/dL — AB (ref 65–99)
POTASSIUM: 3.7 mmol/L (ref 3.5–5.1)
Sodium: 140 mmol/L (ref 135–145)
TOTAL PROTEIN: 4.8 g/dL — AB (ref 6.5–8.1)

## 2016-08-25 LAB — TROPONIN I: TROPONIN I: 1.25 ng/mL — AB (ref <0.03)

## 2016-08-25 LAB — PROTIME-INR
INR: 2.2
PROTHROMBIN TIME: 24.8 s — AB (ref 11.4–15.2)

## 2016-08-25 LAB — PREPARE FRESH FROZEN PLASMA: Unit division: 0

## 2016-08-25 LAB — LACTIC ACID, PLASMA
Lactic Acid, Venous: 14.7 mmol/L (ref 0.5–1.9)
Lactic Acid, Venous: 15.2 mmol/L (ref 0.5–1.9)

## 2016-08-25 LAB — PHOSPHORUS: Phosphorus: 6.4 mg/dL — ABNORMAL HIGH (ref 2.5–4.6)

## 2016-08-25 LAB — ECHOCARDIOGRAM COMPLETE
HEIGHTINCHES: 68 in
WEIGHTICAEL: 3160.51 [oz_av]

## 2016-08-25 MED ORDER — ACETAMINOPHEN 650 MG RE SUPP: 650.0000 mg | Freq: Four times a day (QID) | RECTAL | Status: DC | PRN

## 2016-08-25 MED ORDER — ACETAMINOPHEN 325 MG PO TABS: 650.0000 mg | ORAL_TABLET | Freq: Four times a day (QID) | ORAL | Status: DC | PRN

## 2016-08-25 MED ORDER — FENTANYL CITRATE (PF) 100 MCG/2ML IJ SOLN
50.0000 ug | INTRAMUSCULAR | Status: DC | PRN
  Administered 2016-08-25 – 2016-08-27 (×2): 50 ug via INTRAVENOUS
  Administered 2016-08-29: 100 ug via INTRAVENOUS
  Filled 2016-08-25: qty 2

## 2016-08-25 MED ORDER — VITAL HIGH PROTEIN PO LIQD: 1000.0000 mL | ORAL | Status: DC

## 2016-08-25 MED ORDER — FREE WATER
50.0000 mL | Freq: Four times a day (QID) | Status: DC
  Administered 2016-08-25 – 2016-08-29 (×16): 50 mL

## 2016-08-25 MED ORDER — SODIUM CHLORIDE 0.9 % IV SOLN
INTRAVENOUS | Status: AC
  Administered 2016-08-25: 18:00:00 via INTRAVENOUS
  Administered 2016-08-25: 2.1 [IU]/h via INTRAVENOUS
  Administered 2016-08-26: 7.3 [IU]/h via INTRAVENOUS
  Filled 2016-08-25 (×3): qty 1

## 2016-08-25 MED ORDER — SODIUM CHLORIDE 0.9 % IV SOLN
1.0000 g | Freq: Once | INTRAVENOUS | Status: AC
  Administered 2016-08-25: 1 g via INTRAVENOUS
  Filled 2016-08-25: qty 10

## 2016-08-25 MED ORDER — FENTANYL 2500MCG IN NS 250ML (10MCG/ML) PREMIX INFUSION
0.0000 ug/h | INTRAVENOUS | Status: DC
  Administered 2016-08-25: 50 ug/h via INTRAVENOUS
  Administered 2016-08-25: 200 ug/h via INTRAVENOUS
  Administered 2016-08-26 – 2016-08-28 (×3): 100 ug/h via INTRAVENOUS
  Filled 2016-08-25 (×5): qty 250

## 2016-08-25 MED ORDER — SODIUM BICARBONATE 8.4 % IV SOLN
200.0000 meq | Freq: Once | INTRAVENOUS | Status: AC
  Administered 2016-08-25: 200 meq via INTRAVENOUS
  Filled 2016-08-25: qty 200

## 2016-08-25 MED ORDER — MIDAZOLAM HCL 2 MG/2ML IJ SOLN
2.0000 mg | INTRAMUSCULAR | Status: DC | PRN
  Administered 2016-08-26 – 2016-08-27 (×5): 2 mg via INTRAVENOUS
  Filled 2016-08-25 (×5): qty 2

## 2016-08-25 MED ORDER — PUREFLOW DIALYSIS SOLUTION
INTRAVENOUS | Status: DC
  Administered 2016-08-25: 14:00:00 via INTRAVENOUS_CENTRAL

## 2016-08-25 MED ORDER — HYDROCORTISONE NA SUCCINATE PF 100 MG IJ SOLR
50.0000 mg | Freq: Four times a day (QID) | INTRAMUSCULAR | Status: DC
  Administered 2016-08-25 – 2016-08-27 (×8): 50 mg via INTRAVENOUS
  Filled 2016-08-25 (×8): qty 2

## 2016-08-25 MED ORDER — VITAL HIGH PROTEIN PO LIQD
1000.0000 mL | ORAL | Status: DC
  Administered 2016-08-25: 20:00:00
  Administered 2016-08-25 – 2016-08-26 (×2): 1000 mL

## 2016-08-25 NOTE — Progress Notes (Signed)
CRITICAL VALUE ALERT  Critical Value:  Lactic: 14.7 & Ca: 6.2  Date & Time Notied:  08/25/16 @ 0616  Provider Notified: Ms. Luci Bankukov, NP  Orders Received/Actions taken: NP will continue the bicarb @ 150 mL, will see what next lactic is in 3 hours.  NP will order Ca replacement. Will continue to monitor pt. closely

## 2016-08-25 NOTE — Procedures (Signed)
PROCEDURE NOTE: L FEMORAL HD CATH PLACEMENT  INDICATION:    MAKI, need for CRRT  CONSENT:   Risks of procedure as well as the alternatives were explained to the patient or surrogate. Consent for procedure obtained. A time out was performed.   PROCEDURE  Sterile technique was used including antiseptics, cap, gloves, gown, hand hygiene, mask and full body sheet.  Skin prep: Chlorhexidine; local anesthetic administered  A triple lumen catheter was placed in the L femoral vein using the Seldinger technique.  EVALUATION:  Blood flow good  Complications: No apparent complications  Patient tolerated the procedure well.    Billy Fischeravid Rheta Hemmelgarn, MD PCCM service Mobile [phone] 08/25/2016 1:13 PM

## 2016-08-25 NOTE — Progress Notes (Signed)
2110: Ms. Megan Bankukov, NP bedside to place R radial A-line. Pt.'s VSS, no complications. 2145: A-line placed and showing appropriate waveform & pulsatile blood flow. Will continue to monitor pt. Closely.

## 2016-08-25 NOTE — Progress Notes (Signed)
Left femoral triaylsis catheter placed by Dr,. Simonds.

## 2016-08-25 NOTE — Procedures (Signed)
Arterial Catheter Insertion Procedure Note Megan Nixon [id number] Megan Nixon  Procedure: Insertion of Arterial Catheter  Indications: Blood pressure monitoring and Frequent blood sampling  Procedure Details Consent: Risks of procedure as well as the alternatives and risks of each were explained to the (patient/caregiver).  Consent for procedure obtained. Time Out: Verified patient identification, verified procedure, site/side was marked, verified correct patient position, special equipment/implants available, medications/allergies/relevent history reviewed, required imaging and test results available.  Performed  Maximum sterile technique was used including antiseptics, cap, gloves, gown, hand hygiene, mask and sheet. Skin prep: Chlorhexidine; local anesthetic administered 20 gauge catheter was inserted into right radial artery using the Seldinger technique.  Evaluation Blood flow good; BP tracing good. Complications: No apparent complications.  Procedure performed under direct supervision of Dr Chales AbrahamsGupta. Ultrasound utilized for realtime vessel cannulation  Magdalene S[facility] ANP-BC Pulmonary and Critical Care Medicine [facility] Pager [phone] or [phone]  Billy Fischeravid Arvo Ealy, MD PCCM service Mobile [phone] Pager [phone] 08/25/2016 12:00 PM

## 2016-08-25 NOTE — Progress Notes (Addendum)
Forehead Sp02 monitor moved from above left eye orbit to above right eye orbit.  No skin breakdown noted.

## 2016-08-25 NOTE — Progress Notes (Signed)
CVVHD started per policy and procedure.  VSS.  Patient tolerating without complications.

## 2016-08-25 NOTE — Progress Notes (Signed)
Pharmacy Consult for CRRT Medication Adjustment  No Known Allergies  Patient Measurements: Height: 5\' 8"  (172.7 cm) Weight: 197 lb 8.5 oz (89.6 kg) IBW/kg (Calculated) : 63.9  Vital Signs: Temp: 100.6 F (38.1 C) (07/02 1200) Temp Source: Core (Comment) (07/02 0400) BP: 97/74 (07/02 1200) Pulse Rate: 116 (07/02 1200) Intake/Output from previous day: 07/01 0701 - 07/02 0700 In: 6860.1 [I.V.:4120.1; Blood:240; IV Piggyback:2500] Out: 402 [Urine:302; Emesis/NG output:100] Intake/Output from this shift: Total I/O In: 1160 [I.V.:1050; IV Piggyback:110] Out: -   Labs:  Recent Labs  08/24/16 0919 08/24/16 1317 08/24/16 1544 08/24/16 1600 08/24/16 2115 08/24/16 2117 08/24/16 2118 08/25/16 0508  WBC  --   --   --  23.4*  --   --  34.1* 32.2*  HGB  --   --   --  11.1*  --   --  11.9* 11.3*  HCT  --   --   --  34.7*  --   --  36.1 32.9*  PLT  --   --   --  69*  --   --  54* 32*  APTT  --  64* 68*  --   --  52*  --   --   CREATININE 1.91*  --   --  2.57* 2.62*  --   --  2.96*  MG  --   --   --   --   --   --  1.1* 2.3  PHOS  --   --   --   --   --   --  6.5* 6.4*  ALBUMIN 3.2*  --   --   --  2.7*  --   --  2.3*  PROT 6.7  --   --   --  5.8*  --   --  4.8*  AST 83*  --   --   --  94*  --   --  139*  ALT 44  --   --   --  43  --   --  48  ALKPHOS 78  --   --   --  79  --   --  60  BILITOT 0.9  --   --   --  1.0  --   --  0.9   Estimated Creatinine Clearance: 21.3 mL/min (A) (by C-G formula based on SCr of 2.96 mg/dL (H)).  Medical History: Past Medical History:  Diagnosis Date  . Diabetes mellitus without complication (HCC)   . Hypertension   . Stroke [facility](HCC)     Assessment: 66 y/o F with a h/o DM, HTN, and CVA admitted with septic shock due to pyelonephritis. Patient is now requiring CRRT.   Plan:  No medications require adjustment at present. Pharmacy will continue to monitor and adjust per consult.   Luisa Harthristy, Demarian Epps D 08/25/2016,1:36 PM

## 2016-08-25 NOTE — Progress Notes (Signed)
Patient continues on pressors at this time. Husband at bedside. Order for PRN pain medication. Will continue to monitor.

## 2016-08-25 NOTE — Progress Notes (Signed)
CRITICAL VALUE ALERT  Critical Value:  Lactic: 14.0  Date & Time Notied:  08/25/16 @ 2238  Provider Notified: Ms. Luci Bankukov, NP  Orders Received/Actions taken: 2 L IVF bolus ordered. Will continue to monitor pt. Closely.

## 2016-08-25 NOTE — Progress Notes (Signed)
CH. noted the husband's presence throughout the afternoon and stopped to pray and to give spiritual and emotional support.

## 2016-08-25 NOTE — Progress Notes (Signed)
ANTIBIOTIC CONSULT NOTE  Pharmacy Consult for Zosyn  Indication: sepsis  No Known Allergies  Patient Measurements: Height: 5\' 8"  (172.7 cm) Weight: 197 lb 8.5 oz (89.6 kg) IBW/kg (Calculated) : 63.9 Adjusted Body Weight:   Vital Signs: Temp: 100 F (37.8 C) (07/02 0800) Temp Source: Core (Comment) (07/02 0400) BP: 96/64 (07/02 0800) Pulse Rate: 117 (07/02 0800) Intake/Output from previous day: 07/01 0701 - 07/02 0700 In: 6860.1 [I.V.:4120.1; Blood:240; IV Piggyback:2500] Out: 402 [Urine:302; Emesis/NG output:100] Intake/Output from this shift: Total I/O In: 369 [I.V.:259; IV Piggyback:110] Out: -   Labs:  Recent Labs  08/24/16 1600 08/24/16 2115 08/24/16 2118 08/25/16 0508  WBC 23.4*  --  34.1* 32.2*  HGB 11.1*  --  11.9* 11.3*  PLT 69*  --  54* 32*  CREATININE 2.57* 2.62*  --  2.96*   Estimated Creatinine Clearance: 21.3 mL/min (A) (by C-G formula based on SCr of 2.96 mg/dL (H)).  Recent Labs  08/24/16 1600  VANCORANDOM 15     Microbiology: Recent Results (from the past 720 hour(s))  Blood Culture (routine x 2)     Status: None (Preliminary result)   Collection Time: 08/24/16  8:25 AM  Result Value Ref Range Status   Specimen Description BLOOD RIGHT ANTECUBITAL  Final   Special Requests   Final    BOTTLES DRAWN AEROBIC AND ANAEROBIC Blood Culture adequate volume   Culture  Setup Time   Final    GRAM NEGATIVE RODS IN BOTH AEROBIC AND ANAEROBIC BOTTLES CRITICAL RESULT CALLED TO, READ BACK BY AND VERIFIED WITH: JASON ROBBINS ON 08/24/16 AT 1933 QSD    Culture GRAM NEGATIVE RODS  Final   Report Status PENDING  Incomplete  Blood Culture (routine x 2)     Status: None (Preliminary result)   Collection Time: 08/24/16  8:25 AM  Result Value Ref Range Status   Specimen Description BLOOD RIGHT ANTECUBITAL  Final   Special Requests   Final    BOTTLES DRAWN AEROBIC AND ANAEROBIC Blood Culture adequate volume   Culture  Setup Time   Final    Organism ID to  follow GRAM NEGATIVE RODS IN BOTH AEROBIC AND ANAEROBIC BOTTLES CRITICAL RESULT CALLED TO, READ BACK BY AND VERIFIED WITH: JASON ROBBINS ON 08/24/16 AT 1933 QSD    Culture GRAM NEGATIVE RODS  Final   Report Status PENDING  Incomplete  Blood Culture ID Panel (Reflexed)     Status: Abnormal   Collection Time: 08/24/16  8:25 AM  Result Value Ref Range Status   Enterococcus species NOT DETECTED NOT DETECTED Final   Listeria monocytogenes NOT DETECTED NOT DETECTED Final   Staphylococcus species NOT DETECTED NOT DETECTED Final   Staphylococcus aureus NOT DETECTED NOT DETECTED Final   Streptococcus species NOT DETECTED NOT DETECTED Final   Streptococcus agalactiae NOT DETECTED NOT DETECTED Final   Streptococcus pneumoniae NOT DETECTED NOT DETECTED Final   Streptococcus pyogenes NOT DETECTED NOT DETECTED Final   Acinetobacter baumannii NOT DETECTED NOT DETECTED Final   Enterobacteriaceae species DETECTED (A) NOT DETECTED Final    Comment: Enterobacteriaceae represent a large family of gram-negative bacteria, not a single organism. CRITICAL RESULT CALLED TO, READ BACK BY AND VERIFIED WITH: JASON ROBBINS ON 08/24/16 AT 1933 QSD    Enterobacter cloacae complex NOT DETECTED NOT DETECTED Final   Escherichia coli NOT DETECTED NOT DETECTED Final   Klebsiella oxytoca NOT DETECTED NOT DETECTED Final   Klebsiella pneumoniae DETECTED (A) NOT DETECTED Final    Comment: CRITICAL RESULT  CALLED TO, READ BACK BY AND VERIFIED WITH: JASON ROBBINS ON 08/24/16 AT 1933 QSD    Proteus species NOT DETECTED NOT DETECTED Final   Serratia marcescens NOT DETECTED NOT DETECTED Final   Carbapenem resistance NOT DETECTED NOT DETECTED Final   Haemophilus influenzae NOT DETECTED NOT DETECTED Final   Neisseria meningitidis NOT DETECTED NOT DETECTED Final   Pseudomonas aeruginosa NOT DETECTED NOT DETECTED Final   Candida albicans NOT DETECTED NOT DETECTED Final   Candida glabrata NOT DETECTED NOT DETECTED Final   Candida  krusei NOT DETECTED NOT DETECTED Final   Candida parapsilosis NOT DETECTED NOT DETECTED Final   Candida tropicalis NOT DETECTED NOT DETECTED Final  Body fluid culture     Status: None (Preliminary result)   Collection Time: 08/24/16  3:30 PM  Result Value Ref Range Status   Specimen Description FLUID  Final   Special Requests LEFT NEPHROSTOMY  Final   Gram Stain   Final    WBC PRESENT, PREDOMINANTLY PMN GRAM NEGATIVE RODS CYTOSPIN SMEAR Performed at [facility], [address], [place], [place] [address]    Culture PENDING  Incomplete   Report Status PENDING  Incomplete  MRSA PCR Screening     Status: None   Collection Time: 08/24/16  3:44 PM  Result Value Ref Range Status   MRSA by PCR NEGATIVE NEGATIVE Final    Comment:        The GeneXpert MRSA Assay (FDA approved for NASAL specimens only), is one component of a comprehensive MRSA colonization surveillance program. It is not intended to diagnose MRSA infection nor to guide or monitor treatment for MRSA infections.     Medical History: Past Medical History:  Diagnosis Date  . Diabetes mellitus without complication ([place])   . Hypertension   . Stroke [facility])     Medications:  Prescriptions Prior to Admission  Medication Sig Dispense Refill Last Dose  . glimepiride (AMARYL) 1 MG tablet Take 1 mg by mouth daily with breakfast.    08/23/2016 at 0800  . irbesartan-hydrochlorothiazide (AVALIDE) 300-12.5 MG tablet Take 1 tablet by mouth daily.    08/23/2016 at 0800  . meloxicam (MOBIC) 15 MG tablet Take 15 mg by mouth daily.   08/23/2016 at 0800  . metFORMIN (GLUCOPHAGE) 500 MG tablet 500 mg 2 (two) times daily with a meal.    08/23/2016 at 1800  . verapamil (CALAN-SR) 180 MG CR tablet 180 mg daily.    08/23/2016 at 0800   Scheduled:  . chlorhexidine gluconate (MEDLINE KIT)  15 mL Mouth Rinse BID  . feeding supplement (VITAL HIGH PROTEIN)  1,000 mL Per Tube Q24H  . hydrocortisone sod succinate (SOLU-CORTEF) inj  50 mg  Intravenous Q6H  . mouth rinse  15 mL Mouth Rinse 10 times per day  . sodium chloride flush  10-40 mL Intracatheter Q12H   Assessment: Pharmacy consulted to assist in the management of Vancomycin and Zosyn in this 66 year old woman being treated for septic shock due to multifocal pna and uti. Blood cultures resulted with Klebsiella pneumoniae without carbapenem resistance.   Plan:  After discussion with Dr. Alva Garnet, will continue Zosyn for now and narrow antibiotics upon sensitivity results.   Continue Zosyn 3.375 g EI q 8 hours.   Ulice Dash D 08/25/2016,10:58 AM

## 2016-08-25 NOTE — Progress Notes (Signed)
*  PRELIMINARY RESULTS* Echocardiogram 2D Echocardiogram has been performed.  Cristela BlueHege, Eeshan Verbrugge 08/25/2016, 11:31 AM

## 2016-08-25 NOTE — Progress Notes (Signed)
ET tube pulled back 2 cm to 22 @ lip. FIO2 to 80% per order M. Tukov,NP

## 2016-08-25 NOTE — Progress Notes (Signed)
PULMONARY / CRITICAL CARE MEDICINE   Name: Megan FerdinandSarah S Nixon MRN: [id number] DOB: April 18, 1950    ADMISSION DATE:  08/24/2016  PT PROFILE:   66 y.o. F with hx of DM, hypertension admitted with severe sepsis/septic shock due to pyelonephritis, left hydronephrosis, klebsiella bacteremia  MAJOR EVENTS/TEST RESULTS: 07/01 admitted to ICU via emergency department as above 07/01 CT renal stone study: 5.5 x 5.0 mm left UPJ calculus with high-grade obstructive findings 07/01 CTA chest: No PE, patchy bilateral infiltrates 07/01 left nephrostomy tube placed 07/02 echocardiogram:  07/02 nephrology consultation - CRRT ordered  INDWELLING DEVICES:: ETT 07/01 >>  R IJ CVL 07/01 >>  R radial art line 07/01 >>   MICRO DATA: MRSA PCR 07/01 >> NEG Urine 07/01 >> GNRs > Resp 07/01 >>  Blood 07/01 >> 2/2 Klebsiella   ANTIMICROBIALS:  Vanc 07/01 >> 07/01 Pip-tazo 07/01 >>    SUBJECTIVE:  RASS -4, intubated, sedated  VITAL SIGNS: BP 96/64   Pulse (!) 117   Temp 100 F (37.8 C)   Resp (!) 23   Ht 5\' 8"  (1.727 m)   Wt 197 lb 8.5 oz (89.6 kg)   SpO2 95%   BMI 30.03 kg/m   HEMODYNAMICS: CVP:  [20 mmHg-28 mmHg] 22 mmHg  VENTILATOR SETTINGS: Vent Mode: PRVC FiO2 (%):  [60 %-100 %] 60 % Set Rate:  [16 bmp-24 bmp] 16 bmp Vt Set:  [500 mL] 500 mL PEEP:  [5 cmH20] 5 cmH20 Plateau Pressure:  [22 cmH20-25 cmH20] 23 cmH20  INTAKE / OUTPUT: I/O last 3 completed shifts: In: 6860.1 [I.V.:4120.1; Blood:240; IV Piggyback:2500] Out: 402 [Urine:302; Emesis/NG output:100]  PHYSICAL EXAMINATION: General: Intubated, sedated, RASS -4 Neuro: PERRLA, EOMI, M AEs HEENT: NCAT, sclerae white Cardiovascular: Tachycardia, regular, no murmurs Lungs: No wheezes Abdomen: Soft, diminished bowel sounds, no masses Ext: Warm, no edema, diminished pedal pulses  LABS:  BMET  Recent Labs Lab 08/24/16 0919 08/24/16 1600 08/24/16 2115 08/25/16 0508  NA 138  --  134* 140  K 3.5  --  3.7 3.7  CL 101   --  98* 95*  CO2 17*  --  12* 18*  BUN 27*  --  35* 37*  CREATININE 1.91* 2.57* 2.62* 2.96*  GLUCOSE 76  --  274* 284*    Electrolytes  Recent Labs Lab 08/24/16 0919 08/24/16 2115 08/24/16 2118 08/25/16 0508  CALCIUM 8.7* 7.1*  --  6.2*  MG  --   --  1.1* 2.3  PHOS  --   --  6.5* 6.4*    CBC  Recent Labs Lab 08/24/16 1600 08/24/16 2118 08/25/16 0508  WBC 23.4* 34.1* 32.2*  HGB 11.1* 11.9* 11.3*  HCT 34.7* 36.1 32.9*  PLT 69* 54* 32*    Coag's  Recent Labs Lab 08/24/16 1317 08/24/16 1544 08/24/16 2117 08/24/16 2118 08/25/16 0847  APTT 64* 68* 52*  --   --   INR 2.05 2.22  --  2.17 2.20    Sepsis Markers  Recent Labs Lab 08/24/16 1544 08/24/16 2117 08/25/16 0515 08/25/16 0847  LATICACIDVEN  --  14.0* 14.7* 15.2*  PROCALCITON >150.00  --   --   --     ABG  Recent Labs Lab 08/24/16 2213 08/25/16 0046 08/25/16 0508  PHART 7.14* 7.23* 7.35  PCO2ART 28* 31* 35  PO2ART 101 94 75*    Liver Enzymes  Recent Labs Lab 08/24/16 0919 08/24/16 2115 08/25/16 0508  AST 83* 94* 139*  ALT 44 43 48  ALKPHOS 78  79 60  BILITOT 0.9 1.0 0.9  ALBUMIN 3.2* 2.7* 2.3*    Cardiac Enzymes  Recent Labs Lab 08/24/16 1600 08/24/16 2116 08/25/16 0508  TROPONINI 0.13* 0.77* 1.25*    Glucose  Recent Labs Lab 08/25/16 0605 08/25/16 0659 08/25/16 0805 08/25/16 0909 08/25/16 1015 08/25/16 1120  GLUCAP 176* 205* 193* 231* 235* 221*    CXR:  Edema versus ARDS pattern   ASSESSMENT / PLAN:  PULMONARY A: Acute hypoxemic respiratory failure ARDS Possible pulmonary edema Ventilator dependence P:   Cont full vent support - settings reviewed and/or adjusted Cont vent bundle Daily SBT if/when meets criteria   CARDIOVASCULAR A:  Septic shock Minimally elevated troponin I PRWP on EKG P:  Continue vasopressors to maintain M AP > 65 mmHg Wean phenylephrine first, then norepinephrine Will need cardiology evaluation after critical phase of  this illness has resolved Follow-up echocardiogram performed 07/02  RENAL A:   AKI Lactic acidosis P:   Monitor BMET intermittently Monitor I/Os Correct electrolytes as indicated Nephrology consultation. To begin CRRT today  GASTROINTESTINAL A:   No issues P:   SUP: IV famotidine Begin trickle TFs 07/02  HEMATOLOGIC A:   Severe thrombocytopenia - likely DIC P:  DVT px: SCDs Monitor CBC intermittently Transfuse per usual guidelines Heparin discontinued 07/03  INFECTIOUS A:   Pyelonephritis Klebsiella bacteremia Severe sepsis P:   Monitor temp, WBC count Micro and abx as above  ENDOCRINE A:   DM 2 Relative adrenal insufficiency P:   Continue insulin infusion Hydrocortisone dose adjusted  NEUROLOGIC A:   Acute septic encephalopathy ICU/vent associated discomfort P:   RASS goal: -2,-3 Continue PAD protocol   FAMILY:     CCM time: 60 mins The above time includes time spent in consultation with patient and/or family members and reviewing care plan on multidisciplinary rounds  Megan Fischer, MD PCCM service Mobile [phone] Pager [phone] 08/25/2016 12:19 PM

## 2016-08-25 NOTE — Progress Notes (Signed)
Calcium result of 6.1 called to Dr. Ronn MelenaKolloru. Information acknowleged  no new orders obtained.

## 2016-08-25 NOTE — Progress Notes (Signed)
Urology Consult Follow Up  Subjective: Patient remains only responsive to pain.  Left nephrostomy tube placed yesterday.  CT noted left UPJ calculus.    CRRT is ordered.    WBC count 32.2, decreased from 34.1.  Serum creatinine 2.96, increased from 2.62 yesterday.  UOP remains poor at 402.     Anti-infectives: Anti-infectives    Start     Dose/Rate Route Frequency Ordered Stop   08/25/16 1800  azithromycin (ZITHROMAX) 500 mg in dextrose 5 % 250 mL IVPB  Status:  Discontinued     500 mg 250 mL/hr over 60 Minutes Intravenous Every 24 hours 08/24/16 1708 08/24/16 1947   08/24/16 2000  piperacillin-tazobactam (ZOSYN) IVPB 3.375 g  Status:  Discontinued     3.375 g 12.5 mL/hr over 240 Minutes Intravenous Every 12 hours 08/24/16 1404 08/24/16 1405   08/24/16 1600  piperacillin-tazobactam (ZOSYN) IVPB 3.375 g     3.375 g 12.5 mL/hr over 240 Minutes Intravenous Every 8 hours 08/24/16 1405     08/24/16 1545  cefTRIAXone (ROCEPHIN) 1 g in dextrose 5 % 50 mL IVPB     1 g 100 mL/hr over 30 Minutes Intravenous  Once 08/24/16 1543 08/24/16 1656   08/24/16 1545  azithromycin (ZITHROMAX) 500 mg in dextrose 5 % 250 mL IVPB     500 mg 250 mL/hr over 60 Minutes Intravenous  Once 08/24/16 1543 08/24/16 1801   08/24/16 1415  vancomycin (VANCOCIN) IVPB 1000 mg/200 mL premix  Status:  Discontinued     1,000 mg 200 mL/hr over 60 Minutes Intravenous STAT 08/24/16 1403 08/24/16 1948   08/24/16 0915  vancomycin (VANCOCIN) IVPB 1000 mg/200 mL premix     1,000 mg 200 mL/hr over 60 Minutes Intravenous  Once 08/24/16 0910 08/24/16 1145   08/24/16 0915  piperacillin-tazobactam (ZOSYN) IVPB 3.375 g     3.375 g 100 mL/hr over 30 Minutes Intravenous  Once 08/24/16 0910 08/24/16 1145      Current Facility-Administered Medications  Medication Dose Route Frequency Provider Last Rate Last Dose  . acetaminophen (TYLENOL) tablet 650 mg  650 mg Per Tube Q6H PRN Wilhelmina Mcardle, MD       Or  . acetaminophen  (TYLENOL) suppository 650 mg  650 mg Rectal Q6H PRN Wilhelmina Mcardle, MD      . albuterol (PROVENTIL) (2.5 MG/3ML) 0.083% nebulizer solution 2.5 mg  2.5 mg Nebulization Q2H PRN Demetrios Loll, MD      . bisacodyl (DULCOLAX) EC tablet 5 mg  5 mg Oral Daily PRN Demetrios Loll, MD      . chlorhexidine gluconate (MEDLINE KIT) (PERIDEX) 0.12 % solution 15 mL  15 mL Mouth Rinse BID Dimas Chyle, MD   15 mL at 08/25/16 1021  . famotidine (PEPCID) IVPB 20 mg premix  20 mg Intravenous Q24H Mauri Brooklyn, MD   Stopped at 08/24/16 1801  . feeding supplement (VITAL HIGH PROTEIN) liquid 1,000 mL  1,000 mL Per Tube Q24H Hillary Bow, MD   1,000 mL at 08/25/16 1234  . fentaNYL (SUBLIMAZE) injection 50-100 mcg  50-100 mcg Intravenous Q1H PRN Dorene Sorrow S, NP   50 mcg at 08/25/16 0447  . fentaNYL 2583mg in NS 2594m(1038mml) infusion-PREMIX  0-300 mcg/hr Intravenous Continuous SimWilhelmina McardleD 17.5 mL/hr at 08/25/16 1244 175 mcg/hr at 08/25/16 1244  . free water 50 mL  50 mL Per Tube Q6H Sudini, SriAlveta HeimlichD   50 mL at 08/25/16 1200  . hydrocortisone sodium succinate (SOLU-CORTEF) 100 MG  injection 50 mg  50 mg Intravenous Q6H Wilhelmina Mcardle, MD   50 mg at 08/25/16 1233  . insulin regular (NOVOLIN R,HUMULIN R) 100 Units in sodium chloride 0.9 % 100 mL (1 Units/mL) infusion   Intravenous Continuous Tukov, Magadalene S, NP 9.9 mL/hr at 08/25/16 1227 9.9 Units/hr at 08/25/16 1227  . MEDLINE mouth rinse  15 mL Mouth Rinse 10 times per day Dimas Chyle, MD   15 mL at 08/25/16 1233  . midazolam (VERSED) injection 2 mg  2 mg Intravenous Q1H PRN Wilhelmina Mcardle, MD      . norepinephrine (LEVOPHED) 16 mg in dextrose 5 % 250 mL (0.064 mg/mL) infusion  0-65 mcg/min Intravenous Titrated Dorene Sorrow S, NP 31.9 mL/hr at 08/25/16 0702 34 mcg/min at 08/25/16 0702  . ondansetron (ZOFRAN) tablet 4 mg  4 mg Oral Q6H PRN Demetrios Loll, MD       Or  . ondansetron [facility]) injection 4 mg  4 mg Intravenous Q6H PRN Demetrios Loll, MD       . phenylephrine (NEO-SYNEPHRINE) 40 mg in sodium chloride 0.9 % 250 mL (0.16 mg/mL) infusion  0-400 mcg/min Intravenous Titrated Dorene Sorrow S, NP 31.9 mL/hr at 08/25/16 0851 85 mcg/min at 08/25/16 0851  . piperacillin-tazobactam (ZOSYN) IVPB 3.375 g  3.375 g Intravenous Q8H Larene Beach, RPH 12.5 mL/hr at 08/25/16 1000 3.375 g at 08/25/16 1000  . senna-docusate (Senokot-S) tablet 1 tablet  1 tablet Oral QHS PRN Demetrios Loll, MD      . sodium bicarbonate 200 mEq in dextrose 5 % 1,000 mL infusion   Intravenous Continuous Wilhelmina Mcardle, MD 75 mL/hr at 08/25/16 [phone]    . sodium chloride flush (NS) 0.9 % injection 10-40 mL  10-40 mL Intracatheter Q12H Sudini, Alveta Heimlich, MD   10 mL at 08/24/16 2150  . sodium chloride flush (NS) 0.9 % injection 10-40 mL  10-40 mL Intracatheter PRN Sudini, Alveta Heimlich, MD      . vasopressin (PITRESSIN) 40 Units in sodium chloride 0.9 % 250 mL (0.16 Units/mL) infusion  0.03 Units/min Intravenous Continuous Dimas Chyle, MD 11.3 mL/hr at 08/25/16 0700 0.03 Units/min at 08/25/16 0700     Objective: Vital signs in last 24 hours: Temp:  [98.1 F (36.7 C)-100.6 F (38.1 C)] 100.6 F (38.1 C) (07/02 1200) Pulse Rate:  [115-140] 116 (07/02 1200) Resp:  [13-44] 21 (07/02 1200) BP: (47-158)/(31-92) 97/74 (07/02 1200) SpO2:  [73 %-100 %] 94 % (07/02 1200) Arterial Line BP: (84-164)/(42-140) 99/57 (07/02 1200) FiO2 (%):  [60 %-100 %] 60 % (07/02 1200) Weight:  [197 lb 8.5 oz (89.6 kg)] 197 lb 8.5 oz (89.6 kg) (07/01 1547)  Intake/Output from previous day: 07/01 0701 - 07/02 0700 In: 6860.1 [I.V.:4120.1; Blood:240; IV HQIONGEXB:2841] Out: 65 [Urine:302; Emesis/NG output:100] Intake/Output this shift: Total I/O In: 1160 [I.V.:1050; IV Piggyback:110] Out: -    Physical Exam Constitutional: Responsive to pain.   HEENT: Intubated.  Trachea midline, no masses. Cardiovascular: No clubbing or edema. Respiratory: Normal respiratory effort, no increased work of  breathing. GI: Abdomen is soft, non tender, non distended, no abdominal masses.  GU: No CVA tenderness.  No bladder fullness or masses.  Nephrostomy tube in place draining dark yellow red tinged urine.  Foley in place draining yellow urine.   Skin: No rashes, bruises or suspicious lesions. Lymph: No cervical or inguinal adenopathy. Neurologic: Grossly intact, no focal deficits, moving all 4 extremities. Psychiatric: Normal mood and affect.  Lab Results:   Recent Labs  08/24/16 2118 08/25/16 0508  WBC 34.1* 32.2*  HGB 11.9* 11.3*  HCT 36.1 32.9*  PLT 54* 32*   BMET  Recent Labs  08/24/16 2115 08/25/16 0508  NA 134* 140  K 3.7 3.7  CL 98* 95*  CO2 12* 18*  GLUCOSE 274* 284*  BUN 35* 37*  CREATININE 2.62* 2.96*  CALCIUM 7.1* 6.2*   PT/INR  Recent Labs  08/24/16 2118 08/25/16 0847  LABPROT 24.5* 24.8*  INR 2.17 2.20   ABG  Recent Labs  08/25/16 0046 08/25/16 0508  PHART 7.23* 7.35  HCO3 13.0* 19.3*    Studies/Results: Dg Abd 1 View  Result Date: 08/25/2016 CLINICAL DATA:  Orogastric tube placement.  Initial encounter. EXAM: ABDOMEN - 1 VIEW COMPARISON:  CT of the abdomen and pelvis performed 08/24/2016 FINDINGS: The patient's enteric tube is noted coiled overlying the body of the stomach, ending at the antrum of the stomach. The visualized bowel gas pattern is grossly unremarkable. No free intra-abdominal air is seen, though evaluation for free air is limited on supine views. A pigtail catheter is noted overlying the left lower quadrant. No acute osseous abnormalities are seen. Small bilateral pleural effusions are noted. IMPRESSION: Enteric tube noted ending overlying the antrum of the stomach. Electronically Signed   By: Garald Balding M.D.   On: 08/25/2016 05:43   Ct Head Wo Contrast  Result Date: 08/24/2016 CLINICAL DATA:  Altered mental status, confusion beginning yesterday, stroke, hypertension, diabetes mellitus EXAM: CT HEAD WITHOUT CONTRAST TECHNIQUE:  Contiguous axial images were obtained from the base of the skull through the vertex without intravenous contrast. Sagittal and coronal MPR images reconstructed from axial data set. COMPARISON:  05/07/2014 FINDINGS: Brain: Mild generalized atrophy. Normal ventricular morphology. No midline shift or mass effect. Small old lacunar infarct LEFT thalamus. Minimal small vessel chronic ischemic changes of deep cerebral white matter. No intracranial hemorrhage, mass lesion or evidence of acute infarction. No extra-axial fluid collections. Vascular: Atherosclerotic calcification of internal carotid and vertebral arteries at skullbase Skull: Intact Sinuses/Orbits: Clear Other: N/A IMPRESSION: Atrophy with minimal small vessel chronic ischemic changes of deep cerebral white matter. 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 No acute intracranial abnormalities. Electronically Signed   By: Lavonia Dana M.D.   On: 08/24/2016 09:17   Ct Angio Chest Pe W And/or Wo Contrast  Result Date: 08/24/2016 CLINICAL DATA:  Hypoxia, tachycardia EXAM: CT ANGIOGRAPHY CHEST WITH CONTRAST TECHNIQUE: Multidetector CT imaging of the chest was performed using the standard protocol during bolus administration of intravenous contrast. Multiplanar CT image reconstructions and MIPs were obtained to evaluate the vascular anatomy. CONTRAST:  60 cc Isovue 370 IV COMPARISON:  08/24/2016 FINDINGS: Cardiovascular: Cardiomegaly. No evidence of aortic aneurysm. No filling defects in the pulmonary arteries to suggest pulmonary emboli. Diffuse coronary artery calcifications. Mediastinum/Nodes: No mediastinal, hilar, or axillary adenopathy. Lungs/Pleura: Vascular congestion. Bibasilar atelectasis. No pleural effusions. Upper Abdomen: Imaging into the upper abdomen shows no acute findings. Musculoskeletal: Chest wall soft tissues are unremarkable. No acute bony abnormality. Review of the MIP images confirms the above findings. IMPRESSION: No evidence of pulmonary  embolus. Cardiomegaly, coronary artery disease. Pulmonary vascular congestion. Bibasilar atelectasis. No evidence of pulmonary embolus. Electronically Signed   By: Rolm Baptise M.D.   On: 08/24/2016 11:41   Dg Chest Port 1 View  Result Date: 08/25/2016 CLINICAL DATA:  Sepsis. EXAM: PORTABLE CHEST 1 VIEW COMPARISON:  08/24/2016. FINDINGS: Endotracheal tube tip is at the level of the carina. Proximal repositioning of approximately 3-4 cm suggested.  Cardiomegaly with bilateral pulmonary infiltrates suggesting pulmonary edema. Bilateral pleural effusions. Bibasilar atelectasis. No pneumothorax. IMPRESSION: 1. Endotracheal tube tip is at the level of the carina. Proximal repositioning of approximately 3 4 cm suggested. 2. Cardiomegaly with bilateral pulmonary infiltrates suggesting pulmonary edema. Bilateral pleural effusions. 3. Bibasilar atelectasis. Critical Value/emergent results were called by telephone at the time of interpretation on 08/25/2016 at 6:33 am to nurse [place] who verbally acknowledged these results. Electronically Signed   By: Maisie Fus  Register   On: 08/25/2016 06:35   Dg Chest Portable 1 View  Result Date: 08/24/2016 CLINICAL DATA:  Central line placement and intubation. EXAM: PORTABLE CHEST 1 VIEW COMPARISON:  CT of the chest 08/24/2016 FINDINGS: Patient is intubated. Endotracheal tube terminates 2 cm above the carina. The right internal jugular approach central venous catheter terminates in the expected location of the right atrium. Cardiomediastinal silhouette is enlarged, exaggerated by AP portable technique. There is no evidence of focal airspace consolidation, or pneumothorax. There is a possible left pleural effusion. Low lung volumes. Osseous structures are without acute abnormality. Soft tissues are grossly normal. IMPRESSION: Endotracheal tube terminates 2 cm above the carina. Right internal jugular approach central venous catheter tip overlies the expected location of the right atrium.  The cardiac silhouette appears enlarged, which may be exaggerated by AP portable technique. Possible left pleural effusion versus prominent pericardial fat. Given recent instrumentation, decubital radiograph, left side down, should be considered to further evaluate whether or not there is left pleural fluid. Electronically Signed   By: Ted Mcalpine M.D.   On: 08/24/2016 14:30   Dg Chest Port 1 View  Result Date: 08/24/2016 CLINICAL DATA:  Altered mental status, confusion, prior stroke, hypertension, diabetes mellitus EXAM: PORTABLE CHEST 1 VIEW COMPARISON:  Portable exam 0734 hours without priors for comparison FINDINGS: Enlargement of cardiac silhouette with pulmonary vascular congestion. Mild RIGHT basilar atelectasis. Upper lungs clear. No pleural effusion or pneumothorax. Bones demineralized. IMPRESSION: Enlargement of cardiac silhouette with minimal pulmonary vascular congestion. RIGHT basilar atelectasis. Electronically Signed   By: Ulyses Southward M.D.   On: 08/24/2016 07:58   Ct Renal Stone Study  Result Date: 08/24/2016 CLINICAL DATA:  Abdominal pain and UTI. EXAM: CT ABDOMEN AND PELVIS WITHOUT CONTRAST TECHNIQUE: Multidetector CT imaging of the abdomen and pelvis was performed following the standard protocol without IV contrast. COMPARISON:  Chest CT, same date. FINDINGS: Lower chest: Persistent bibasilar atelectasis. Small right pleural effusion. The heart is mildly enlarged. Three-vessel coronary artery calcifications are noted. The distal esophagus is grossly normal. Hepatobiliary: Suspect cirrhotic changes involving the liver with portal venous hypertension and portal venous collaterals. No focal hepatic lesion. Gallstones are noted the gallbladder. Pancreas: Grossly normal without contrast. Spleen: Normal size. Adrenals/Urinary Tract: The adrenal glands are grossly normal. High-grade obstruction of the left kidney due to a 5.5 x 5.0 mm UPJ calculus. Perinephric interstitial changes and fluid  are noted. I do not see any contrast below the calculus. The right kidney is unremarkable. No ureteral dilatation. The bladder is filled with contrast and appears normal. There is a left-sided bladder diverticulum noted. Stomach/Bowel: The stomach, duodenum, small bowel and colon are grossly normal. No obvious mass lesions or obstructive findings. Vascular/Lymphatic: Scattered distal aortic and iliac artery calcifications. No focal aneurysm. Numerous small upper abdominal lymph nodes likely related to cirrhosis. No retroperitoneal adenopathy. Reproductive: The uterus is surgically absent. I believe the right ovary is still present and appears normal. Other: Small amount of free abdominal/pelvic fluid. No pelvic mass or  adenopathy. No inguinal mass or adenopathy. Musculoskeletal: No significant bony findings. Advanced degenerative changes involving the spine. IMPRESSION: 1. 5.5 x 5.0 mm left UPJ calculus with high-grade obstructive findings. 2. Normal right kidney and ureter. The bladder is unremarkable except for a left-sided diverticulum. 3. Suspect cirrhotic changes involving the liver with portal venous hypertension and portal venous collaterals. 4. Cholelithiasis. Suspect gallbladder wall thickening which may be due to cirrhosis. 5. Moderate atherosclerotic calcifications involving the distal aorta and iliac arteries and coronary arteries. Electronically Signed   By: Marijo Sanes M.D.   On: 08/24/2016 12:44   Ir Nephrostomy Placement Left  Result Date: 08/24/2016 INDICATION: 67 year old with sepsis and obstructing stone at the left UPJ. EXAM: PLACEMENT OF LEFT PERCUTANEOUS NEPHROSTOMY TUBE WITH ULTRASOUND AND FLUOROSCOPIC GUIDANCE COMPARISON:  None. MEDICATIONS: Patient is on IV antibiotics. ANESTHESIA/SEDATION: Patient arrived to Intervention Radiology already sedated and intubated. The patient was continuously monitored during the procedure by the interventional radiology nurse under my direct supervision.  CONTRAST:  5 mL - administered into the collecting system(s) FLUOROSCOPY TIME:  Fluoroscopy Time: 1 minutes 18 seconds COMPLICATIONS: None immediate. PROCEDURE: Informed written consent was obtained from the patient's husband after a thorough discussion of the procedural risks, benefits and alternatives. All questions were addressed. Maximal Sterile Barrier Technique was utilized including caps, mask, sterile gowns, sterile gloves, sterile drape, hand hygiene and skin antiseptic. A timeout was performed prior to the initiation of the procedure. Patient was placed prone on the interventional table. The left flank was prepped and draped in a sterile fashion. Skin anesthetized with 1% lidocaine. Left kidney was identified with ultrasound. Using ultrasound guidance, 21 gauge needle was directed into a mid/lower pole calyx. Fluoroscopy confirmed that the needle was in the calyx because there was retained contrast from the recent chest CT. Wire was easily advanced into the renal collecting system. Accustick dilator set was placed. Tract was dilated to accommodate a 10 [place] multipurpose drain. Catheter was attached to gravity bag and sutured to skin. Small amount of blood tinged fluid was sent for culture. FINDINGS: Mild-to-moderate left hydronephrosis. Retained contrast in the collecting system from recent chest CT. Obstructing stone at the UPJ. Nephrostomy tube was successfully advanced into the kidney via a mid/lower pole calyx. Tube is reconstituted in the renal pelvis. Fluoroscopic and ultrasound images were taken and saved for documentation. IMPRESSION: Successful placement of a left percutaneous nephrostomy tube with ultrasound and fluoroscopic guidance. Electronically Signed   By: Markus Daft M.D.   On: 08/24/2016 15:52     Assessment and Plan: 1. Sepsis secondary to an infected obstructing left UPJ stone  - nephrostomy in place  - final urine culture is still pending - plain for at least 14 day course of  antibiotics    2. Left UPJ stone  - will require definitive management on an outpatient basis (2 to 3 weeks post PCN placement)  3. Left hydronephrosis  - currently managed with nephrostomy tube   Will continue to follow.     LOS: 1 day    [facility] 08/25/2016

## 2016-08-25 NOTE — Progress Notes (Signed)
Initial Nutrition Assessment  DOCUMENTATION CODES:   Obesity unspecified  INTERVENTION:   Tube feeds via OGT- Vital HP at goal rate of 165ml/hr  Propofol: 21.5 ml/hr- provides 568kcal/day  Free water flushes 50ml via OGT every 6 hrs  Regimen provides  2128kcals/day, 137g/day protein, 1504ml free water   Pt at low risk for refeeding   NUTRITION DIAGNOSIS:   Inadequate oral intake related to inability to eat (patient ventilated ) as evidenced by NPO status.  GOAL:   Provide needs based on ASPEN/SCCM guidelines  MONITOR:   Diet advancement, Vent status, Labs, Weight trends, TF tolerance  REASON FOR ASSESSMENT:   Consult Enteral/tube feeding initiation and management  ASSESSMENT:   66 y.o. black female with diabetes mellitus type II, hypertension, history of CVA, who [facility] for hydronephrosis, acute renal failure with metabolic acidosis and septic shock secondary to left kidney stone. Pt intubated r/t acute respiratory failure and now s/p nephrostomy tube placement 7/1.    Pt sedated and on vent. On pressors x 3. Spoke to family at bedside who reports that pt is a good eater at home and had been eating well up until Saturday night. Per chart, pt is weight stable. Pt scheduled to start CRRT today.   Medications reviewed and include: solu-cortef, pepcid, fentanyl, insulin, norepinephrine, neo-synephrine, zosyn, Na Bicarb, vasopressin   Labs reviewed: Cl 95(L), BUN 37(H), creat 2.96(H), Ca 6.2(L) adj. 7.56(L), P 6.4(H), Mg 2.3(L) Lactic acid 15.2(H) WBC- 32.2(H) cbgs- 274, 284 x 24hrs  Nutrition-Focused physical exam completed. Findings are no fat depletion, no muscle depletion, and moderate pitting edema in BLE.   Patient is currently intubated on ventilator support MV: 9.6 L/min Temp (24hrs), Avg:99.3 F (37.4 C), Min:98.1 F (36.7 C), Max:100 F (37.8 C)  Map >65  Propofol: 21.5 ml/hr- provides 568kcal/day   Diet Order:   NPO  Skin:   Reviewed, no issues  Last BM:  none since admit   Height:   Ht Readings from Last 1 Encounters:  08/24/16 5\' 8"  (1.727 m)    Weight:   Wt Readings from Last 1 Encounters:  08/24/16 197 lb 8.5 oz (89.6 kg)    Ideal Body Weight:  63.6 kg  BMI:  Body mass index is 30.03 kg/m.  Estimated Nutritional Needs:   Kcal:  1750-1850kcal/day   Protein:  127-140g/day   Fluid:  >1.7L/day or per MD  EDUCATION NEEDS:   Education needs no appropriate at this time  Betsey Holidayasey Miasia Crabtree MS, RD, LDN Pager #[phone]- [phone] After Hours Pager: [phone]

## 2016-08-25 NOTE — Consult Note (Signed)
[facility]  CONSULT NOTE    Date: 08/25/2016                  Patient Name:  Megan Nixon  MRN: [id number]  DOB: 1950/06/25  Age / Sex: 66 y.o., female         PCP: Rusty Aus, MD                 Service Requesting Consult: Dr. Alva Garnet                 Reason for Consult: Acute Renal Failure            History of Present Illness: Ms. Megan Nixon is a 66 y.o. black female with diabetes mellitus type II, hypertension, history of CVA, who was admitted to [facility] on 08/24/2016 for Hydronephrosis [N13.30] Flank pain [R10.9] Kidney stone on left side [N20.0] Septic shock ([place]) [A41.9, R65.21]  Husband at bedside. Patient on three vasopressors: vasopressin, phenylephrine and norepinephrine. Placed on sodium bicarbonate gtt at 683MH/DQ for metabolic acidosis. Patient with oliguric urine output, 339m.   Left nephrostomy tube placed yesterday evening by Dr. HAnselm Pancoast Only [id number].    Medications: Outpatient medications: Prescriptions Prior to Admission  Medication Sig Dispense Refill Last Dose  . glimepiride (AMARYL) 1 MG tablet Take 1 mg by mouth daily with breakfast.    08/23/2016 at 0800  . irbesartan-hydrochlorothiazide (AVALIDE) 300-12.5 MG tablet Take 1 tablet by mouth daily.    08/23/2016 at 0800  . meloxicam (MOBIC) 15 MG tablet Take 15 mg by mouth daily.   08/23/2016 at 0800  . metFORMIN (GLUCOPHAGE) 500 MG tablet 500 mg 2 (two) times daily with a meal.    08/23/2016 at 1800  . verapamil (CALAN-SR) 180 MG CR tablet 180 mg daily.    08/23/2016 at 0800    Current medications: Current Facility-Administered Medications  Medication Dose Route Frequency Provider Last Rate Last Dose  . acetaminophen (TYLENOL) tablet 650 mg  650 mg Per Tube Q6H PRN SiWilhelmina McardleMD       Or  . acetaminophen (TYLENOL) suppository 650 mg  650 mg Rectal Q6H PRN SiWilhelmina McardleMD      . albuterol (PROVENTIL) (2.5 MG/3ML) 0.083% nebulizer solution 2.5 mg  2.5 mg  Nebulization Q2H PRN ChDemetrios LollMD      . bisacodyl (DULCOLAX) EC tablet 5 mg  5 mg Oral Daily PRN ChDemetrios LollMD      . chlorhexidine gluconate (MEDLINE KIT) (PERIDEX) 0.12 % solution 15 mL  15 mL Mouth Rinse BID GuDimas ChyleMD   15 mL at 08/24/16 2054  . famotidine (PEPCID) IVPB 20 mg premix  20 mg Intravenous Q24H SmMauri BrooklynMD   Stopped at 08/24/16 1801  . feeding supplement (VITAL HIGH PROTEIN) liquid 1,000 mL  1,000 mL Per Tube Q24H SiWilhelmina McardleMD      . fentaNYL (SUBLIMAZE) injection 50-100 mcg  50-100 mcg Intravenous Q1H PRN TuMikael SprayNP   50 mcg at 08/25/16 0447  . fentaNYL 250055min NS [id number]) infusion-PREMIX  0-300 mcg/hr Intravenous Continuous SimonWilhelmina Mcardle     . hydrocortisone sodium succinate (SOLU-CORTEF) 100 MG injection 50 mg  50 mg Intravenous Q6H SimonWilhelmina Mcardle     . insulin regular (NOVOLIN R,HUMULIN R) 100 Units in sodium chloride 0.9 % 100 mL (1 Units/mL) infusion   Intravenous Continuous Tukov, Magadalene S, NP 6.8  mL/hr at 08/25/16 0912    . MEDLINE mouth rinse  15 mL Mouth Rinse 10 times per day Dimas Chyle, MD   15 mL at 08/25/16 0609  . midazolam (VERSED) injection 2 mg  2 mg Intravenous Q1H PRN Wilhelmina Mcardle, MD      . norepinephrine (LEVOPHED) 16 mg in dextrose 5 % 250 mL (0.064 mg/mL) infusion  0-65 mcg/min Intravenous Titrated Dorene Sorrow S, NP 31.9 mL/hr at 08/25/16 0702 34 mcg/min at 08/25/16 0702  . ondansetron (ZOFRAN) tablet 4 mg  4 mg Oral Q6H PRN Demetrios Loll, MD       Or  . ondansetron [facility]) injection 4 mg  4 mg Intravenous Q6H PRN Demetrios Loll, MD      . phenylephrine (NEO-SYNEPHRINE) 40 mg in sodium chloride 0.9 % 250 mL (0.16 mg/mL) infusion  0-400 mcg/min Intravenous Titrated Dorene Sorrow S, NP 31.9 mL/hr at 08/25/16 0851 85 mcg/min at 08/25/16 0851  . piperacillin-tazobactam (ZOSYN) IVPB 3.375 g  3.375 g Intravenous Q8H Larene [place], [facility]   Stopped at 08/25/16 0459  . senna-docusate (Senokot-S)  tablet 1 tablet  1 tablet Oral QHS PRN Demetrios Loll, MD      . sodium bicarbonate 200 mEq in dextrose 5 % 1,000 mL infusion   Intravenous Continuous Wilhelmina Mcardle, MD 150 mL/hr at 08/25/16 0700    . sodium chloride flush (NS) 0.9 % injection 10-40 mL  10-40 mL Intracatheter Q12H Sudini, Alveta Heimlich, MD   10 mL at 08/24/16 2150  . sodium chloride flush (NS) 0.9 % injection 10-40 mL  10-40 mL Intracatheter PRN Sudini, Artist, MD      . vasopressin (PITRESSIN) 40 Units in sodium chloride 0.9 % 250 mL (0.16 Units/mL) infusion  0.03 Units/min Intravenous Continuous Dimas Chyle, MD 11.3 mL/hr at 08/25/16 0700 0.03 Units/min at 08/25/16 0700      Allergies: No Known Allergies    Past Medical History: Past Medical History:  Diagnosis Date  . Diabetes mellitus without complication ([place])   . Hypertension   . Stroke [facility])      Past Surgical History: Past Surgical History:  Procedure Laterality Date  . BREAST EXCISIONAL BIOPSY Bilateral 1971   neg  . IR NEPHROSTOMY PLACEMENT LEFT  08/24/2016     Family History: Family History  Problem Relation Age of Onset  . Breast cancer Neg Hx      Social History: Social History   Social History  . Marital status: Married    Spouse name: N/A  . Number of children: N/A  . Years of education: N/A   Occupational History  . Not on file.   Social History Main Topics  . Smoking status: Unknown If Ever Smoked  . Smokeless tobacco: Not on file  . Alcohol use Not on file  . Drug use: Unknown  . Sexual activity: Not on file   Other Topics Concern  . Not on file   Social History Narrative  . No narrative on file     Review of Systems: Review of Systems  Unable to perform ROS: Critical illness    Vital Signs: Blood pressure 96/64, pulse (!) 117, temperature 100 F (37.8 C), resp. rate (!) 23, height _0  (1.727 m), weight 89.6 kg (197 lb 8.5 oz), SpO2 96 %.  Weight trends: Filed Weights   08/24/16 0725 08/24/16 1547  Weight: 85.8 kg  (189 lb 3.2 oz) 89.6 kg (197 lb 8.5 oz)    Physical Exam: General: Critically ill  Head: ETT, OGT  Eyes: Anicteric, PERRL, open eyes  Neck: trachea midline  Lungs:  PRVC 80%  Heart: Sinus tachycardia  Abdomen:  Soft, nontender, obese, left nephrostomy tube  Extremities:  trace peripheral edema.  Neurologic: Intubated, sedated  Skin: No lesions  Access: none     Lab results: Basic Metabolic Panel:  Recent Labs Lab 08/24/16 0919 08/24/16 1600 08/24/16 2115 08/24/16 2118 08/25/16 0508  NA 138  --  134*  --  140  K 3.5  --  3.7  --  3.7  CL 101  --  98*  --  95*  CO2 17*  --  12*  --  18*  GLUCOSE 76  --  274*  --  284*  BUN 27*  --  35*  --  37*  CREATININE 1.91* 2.57* 2.62*  --  2.96*  CALCIUM 8.7*  --  7.1*  --  6.2*  MG  --   --   --  1.1* 2.3  PHOS  --   --   --  6.5* 6.4*    Liver Function Tests:  Recent Labs Lab 08/24/16 0919 08/24/16 2115 08/25/16 0508  AST 83* 94* 139*  ALT 44 43 48  ALKPHOS 78 79 60  BILITOT 0.9 1.0 0.9  PROT 6.7 5.8* 4.8*  ALBUMIN 3.2* 2.7* 2.3*   No results for input(s): LIPASE, AMYLASE in the last 168 hours. No results for input(s): AMMONIA in the last 168 hours.  CBC:  Recent Labs Lab 08/24/16 0824 08/24/16 1600 08/24/16 2118 08/25/16 0508  WBC 14.8* 23.4* 34.1* 32.2*  NEUTROABS  --  21.3*  --  27.1*  HGB 13.0 11.1* 11.9* 11.3*  HCT 39.5 34.7* 36.1 32.9*  MCV 88.9 91.9 91.3 88.6  PLT 87* 69* 54* 32*    Cardiac Enzymes:  Recent Labs Lab 08/24/16 0919 08/24/16 1544 08/24/16 1600 08/24/16 2116 08/25/16 0508  TROPONINI 0.04* 0.13* 0.13* 0.77* 1.25*    BNP: Invalid input(s): POCBNP  CBG:  Recent Labs Lab 08/25/16 0511 08/25/16 0605 08/25/16 0659 08/25/16 0805 08/25/16 0909  GLUCAP 271* 176* 205* 193* 231*    Microbiology: Results for orders placed or performed during the hospital encounter of 08/24/16  Blood Culture (routine x 2)     Status: None (Preliminary result)   Collection Time:  08/24/16  8:25 AM  Result Value Ref Range Status   Specimen Description BLOOD RIGHT ANTECUBITAL  Final   Special Requests   Final    BOTTLES DRAWN AEROBIC AND ANAEROBIC Blood Culture adequate volume   Culture  Setup Time   Final    GRAM NEGATIVE RODS IN BOTH AEROBIC AND ANAEROBIC BOTTLES CRITICAL RESULT CALLED TO, READ BACK BY AND VERIFIED WITH: JASON ROBBINS ON 08/24/16 AT 1933 QSD    Culture GRAM NEGATIVE RODS  Final   Report Status PENDING  Incomplete  Blood Culture (routine x 2)     Status: None (Preliminary result)   Collection Time: 08/24/16  8:25 AM  Result Value Ref Range Status   Specimen Description BLOOD RIGHT ANTECUBITAL  Final   Special Requests   Final    BOTTLES DRAWN AEROBIC AND ANAEROBIC Blood Culture adequate volume   Culture  Setup Time   Final    Organism ID to follow GRAM NEGATIVE RODS IN BOTH AEROBIC AND ANAEROBIC BOTTLES CRITICAL RESULT CALLED TO, READ BACK BY AND VERIFIED WITH: JASON ROBBINS ON 08/24/16 AT 1933 QSD    Culture GRAM NEGATIVE RODS  Final   Report Status  PENDING  Incomplete  Blood Culture ID Panel (Reflexed)     Status: Abnormal   Collection Time: 08/24/16  8:25 AM  Result Value Ref Range Status   Enterococcus species NOT DETECTED NOT DETECTED Final   Listeria monocytogenes NOT DETECTED NOT DETECTED Final   Staphylococcus species NOT DETECTED NOT DETECTED Final   Staphylococcus aureus NOT DETECTED NOT DETECTED Final   Streptococcus species NOT DETECTED NOT DETECTED Final   Streptococcus agalactiae NOT DETECTED NOT DETECTED Final   Streptococcus pneumoniae NOT DETECTED NOT DETECTED Final   Streptococcus pyogenes NOT DETECTED NOT DETECTED Final   Acinetobacter baumannii NOT DETECTED NOT DETECTED Final   Enterobacteriaceae species DETECTED (A) NOT DETECTED Final    Comment: Enterobacteriaceae represent a large family of gram-negative bacteria, not a single organism. CRITICAL RESULT CALLED TO, READ BACK BY AND VERIFIED WITH: JASON ROBBINS ON  08/24/16 AT 1933 QSD    Enterobacter cloacae complex NOT DETECTED NOT DETECTED Final   Escherichia coli NOT DETECTED NOT DETECTED Final   Klebsiella oxytoca NOT DETECTED NOT DETECTED Final   Klebsiella pneumoniae DETECTED (A) NOT DETECTED Final    Comment: CRITICAL RESULT CALLED TO, READ BACK BY AND VERIFIED WITH: JASON ROBBINS ON 08/24/16 AT 1933 QSD    Proteus species NOT DETECTED NOT DETECTED Final   Serratia marcescens NOT DETECTED NOT DETECTED Final   Carbapenem resistance NOT DETECTED NOT DETECTED Final   Haemophilus influenzae NOT DETECTED NOT DETECTED Final   Neisseria meningitidis NOT DETECTED NOT DETECTED Final   Pseudomonas aeruginosa NOT DETECTED NOT DETECTED Final   Candida albicans NOT DETECTED NOT DETECTED Final   Candida glabrata NOT DETECTED NOT DETECTED Final   Candida krusei NOT DETECTED NOT DETECTED Final   Candida parapsilosis NOT DETECTED NOT DETECTED Final   Candida tropicalis NOT DETECTED NOT DETECTED Final  Body fluid culture     Status: None (Preliminary result)   Collection Time: 08/24/16  3:30 PM  Result Value Ref Range Status   Specimen Description FLUID  Final   Special Requests LEFT NEPHROSTOMY  Final   Gram Stain PENDING  Incomplete   Culture PENDING  Incomplete   Report Status PENDING  Incomplete  MRSA PCR Screening     Status: None   Collection Time: 08/24/16  3:44 PM  Result Value Ref Range Status   MRSA by PCR NEGATIVE NEGATIVE Final    Comment:        The GeneXpert MRSA Assay (FDA approved for NASAL specimens only), is one component of a comprehensive MRSA colonization surveillance program. It is not intended to diagnose MRSA infection nor to guide or monitor treatment for MRSA infections.     Coagulation Studies:  Recent Labs  08/24/16 1544 08/24/16 2118 08/25/16 0847  LABPROT 25.0* 24.5* 24.8*  INR 2.22 2.17 2.20    Urinalysis:  Recent Labs  08/24/16 1004  COLORURINE AMBER*  LABSPEC 1.019  PHURINE 5.0  GLUCOSEU  NEGATIVE  HGBUR SMALL*  BILIRUBINUR NEGATIVE  KETONESUR 5*  PROTEINUR 100*  NITRITE POSITIVE*  LEUKOCYTESUR NEGATIVE      Imaging: Dg Abd 1 View  Result Date: 08/25/2016 CLINICAL DATA:  Orogastric tube placement.  Initial encounter. EXAM: ABDOMEN - 1 VIEW COMPARISON:  CT of the abdomen and pelvis performed 08/24/2016 FINDINGS: The patient's enteric tube is noted coiled overlying the body of the stomach, ending at the antrum of the stomach. The visualized bowel gas pattern is grossly unremarkable. No free intra-abdominal air is seen, though evaluation for free air is limited  on supine views. A pigtail catheter is noted overlying the left lower quadrant. No acute osseous abnormalities are seen. Small bilateral pleural effusions are noted. IMPRESSION: Enteric tube noted ending overlying the antrum of the stomach. Electronically Signed   By: Garald Balding M.D.   On: 08/25/2016 05:43   Ct Head Wo Contrast  Result Date: 08/24/2016 CLINICAL DATA:  Altered mental status, confusion beginning yesterday, stroke, hypertension, diabetes mellitus EXAM: CT HEAD WITHOUT CONTRAST TECHNIQUE: Contiguous axial images were obtained from the base of the skull through the vertex without intravenous contrast. Sagittal and coronal MPR images reconstructed from axial data set. COMPARISON:  05/07/2014 FINDINGS: Brain: Mild generalized atrophy. Normal ventricular morphology. No midline shift or mass effect. Small old lacunar infarct LEFT thalamus. Minimal small vessel chronic ischemic changes of deep cerebral white matter. No intracranial hemorrhage, mass lesion or evidence of acute infarction. No extra-axial fluid collections. Vascular: Atherosclerotic calcification of internal carotid and vertebral arteries at skullbase Skull: Intact Sinuses/Orbits: Clear Other: N/A IMPRESSION: Atrophy with minimal small vessel chronic ischemic changes of deep cerebral white matter. 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 No acute intracranial  abnormalities. Electronically Signed   By: Lavonia Dana M.D.   On: 08/24/2016 09:17   Ct Angio Chest Pe W And/or Wo Contrast  Result Date: 08/24/2016 CLINICAL DATA:  Hypoxia, tachycardia EXAM: CT ANGIOGRAPHY CHEST WITH CONTRAST TECHNIQUE: Multidetector CT imaging of the chest was performed using the standard protocol during bolus administration of intravenous contrast. Multiplanar CT image reconstructions and MIPs were obtained to evaluate the vascular anatomy. CONTRAST:  60 cc Isovue 370 IV COMPARISON:  08/24/2016 FINDINGS: Cardiovascular: Cardiomegaly. No evidence of aortic aneurysm. No filling defects in the pulmonary arteries to suggest pulmonary emboli. Diffuse coronary artery calcifications. Mediastinum/Nodes: No mediastinal, hilar, or axillary adenopathy. Lungs/Pleura: Vascular congestion. Bibasilar atelectasis. No pleural effusions. Upper Abdomen: Imaging into the upper abdomen shows no acute findings. Musculoskeletal: Chest wall soft tissues are unremarkable. No acute bony abnormality. Review of the MIP images confirms the above findings. IMPRESSION: No evidence of pulmonary embolus. Cardiomegaly, coronary artery disease. Pulmonary vascular congestion. Bibasilar atelectasis. No evidence of pulmonary embolus. Electronically Signed   By: Rolm Baptise M.D.   On: 08/24/2016 11:41   Dg Chest Port 1 View  Result Date: 08/25/2016 CLINICAL DATA:  Sepsis. EXAM: PORTABLE CHEST 1 VIEW COMPARISON:  08/24/2016. FINDINGS: Endotracheal tube tip is at the level of the carina. Proximal repositioning of approximately 3-4 cm suggested. Cardiomegaly with bilateral pulmonary infiltrates suggesting pulmonary edema. Bilateral pleural effusions. Bibasilar atelectasis. No pneumothorax. IMPRESSION: 1. Endotracheal tube tip is at the level of the carina. Proximal repositioning of approximately 3 4 cm suggested. 2. Cardiomegaly with bilateral pulmonary infiltrates suggesting pulmonary edema. Bilateral pleural effusions. 3.  Bibasilar atelectasis. Critical Value/emergent results were called by telephone at the time of interpretation on 08/25/2016 at 6:33 am to nurse [place] who verbally acknowledged these results. Electronically Signed   By: Marcello Moores  Register   On: 08/25/2016 06:35   Dg Chest Portable 1 View  Result Date: 08/24/2016 CLINICAL DATA:  Central line placement and intubation. EXAM: PORTABLE CHEST 1 VIEW COMPARISON:  CT of the chest 08/24/2016 FINDINGS: Patient is intubated. Endotracheal tube terminates 2 cm above the carina. The right internal jugular approach central venous catheter terminates in the expected location of the right atrium. Cardiomediastinal silhouette is enlarged, exaggerated by AP portable technique. There is no evidence of focal airspace consolidation, or pneumothorax. There is a possible left pleural effusion. Low lung volumes. Osseous structures  are without acute abnormality. Soft tissues are grossly normal. IMPRESSION: Endotracheal tube terminates 2 cm above the carina. Right internal jugular approach central venous catheter tip overlies the expected location of the right atrium. The cardiac silhouette appears enlarged, which may be exaggerated by AP portable technique. Possible left pleural effusion versus prominent pericardial fat. Given recent instrumentation, decubital radiograph, left side down, should be considered to further evaluate whether or not there is left pleural fluid. Electronically Signed   By: Fidela Salisbury M.D.   On: 08/24/2016 14:30   Dg Chest Port 1 View  Result Date: 08/24/2016 CLINICAL DATA:  Altered mental status, confusion, prior stroke, hypertension, diabetes mellitus EXAM: PORTABLE CHEST 1 VIEW COMPARISON:  Portable exam 0734 hours without priors for comparison FINDINGS: Enlargement of cardiac silhouette with pulmonary vascular congestion. Mild RIGHT basilar atelectasis. Upper lungs clear. No pleural effusion or pneumothorax. Bones demineralized. IMPRESSION:  Enlargement of cardiac silhouette with minimal pulmonary vascular congestion. RIGHT basilar atelectasis. Electronically Signed   By: Lavonia Dana M.D.   On: 08/24/2016 07:58   Ct Renal Stone Study  Result Date: 08/24/2016 CLINICAL DATA:  Abdominal pain and UTI. EXAM: CT ABDOMEN AND PELVIS WITHOUT CONTRAST TECHNIQUE: Multidetector CT imaging of the abdomen and pelvis was performed following the standard protocol without IV contrast. COMPARISON:  Chest CT, same date. FINDINGS: Lower chest: Persistent bibasilar atelectasis. Small right pleural effusion. The heart is mildly enlarged. Three-vessel coronary artery calcifications are noted. The distal esophagus is grossly normal. Hepatobiliary: Suspect cirrhotic changes involving the liver with portal venous hypertension and portal venous collaterals. No focal hepatic lesion. Gallstones are noted the gallbladder. Pancreas: Grossly normal without contrast. Spleen: Normal size. Adrenals/Urinary Tract: The adrenal glands are grossly normal. High-grade obstruction of the left kidney due to a 5.5 x 5.0 mm UPJ calculus. Perinephric interstitial changes and fluid are noted. I do not see any contrast below the calculus. The right kidney is unremarkable. No ureteral dilatation. The bladder is filled with contrast and appears normal. There is a left-sided bladder diverticulum noted. Stomach/Bowel: The stomach, duodenum, small bowel and colon are grossly normal. No obvious mass lesions or obstructive findings. Vascular/Lymphatic: Scattered distal aortic and iliac artery calcifications. No focal aneurysm. Numerous small upper abdominal lymph nodes likely related to cirrhosis. No retroperitoneal adenopathy. Reproductive: The uterus is surgically absent. I believe the right ovary is still present and appears normal. Other: Small amount of free abdominal/pelvic fluid. No pelvic mass or adenopathy. No inguinal mass or adenopathy. Musculoskeletal: No significant bony findings. Advanced  degenerative changes involving the spine. IMPRESSION: 1. 5.5 x 5.0 mm left UPJ calculus with high-grade obstructive findings. 2. Normal right kidney and ureter. The bladder is unremarkable except for a left-sided diverticulum. 3. Suspect cirrhotic changes involving the liver with portal venous hypertension and portal venous collaterals. 4. Cholelithiasis. Suspect gallbladder wall thickening which may be due to cirrhosis. 5. Moderate atherosclerotic calcifications involving the distal aorta and iliac arteries and coronary arteries. Electronically Signed   By: Marijo Sanes M.D.   On: 08/24/2016 12:44   Ir Nephrostomy Placement Left  Result Date: 08/24/2016 INDICATION: 66 year old with sepsis and obstructing stone at the left UPJ. EXAM: PLACEMENT OF LEFT PERCUTANEOUS NEPHROSTOMY TUBE WITH ULTRASOUND AND FLUOROSCOPIC GUIDANCE COMPARISON:  None. MEDICATIONS: Patient is on IV antibiotics. ANESTHESIA/SEDATION: Patient arrived to Intervention Radiology already sedated and intubated. The patient was continuously monitored during the procedure by the interventional radiology nurse under my direct supervision. CONTRAST:  5 mL - administered into the collecting system(s)  FLUOROSCOPY TIME:  Fluoroscopy Time: 1 minutes 18 seconds COMPLICATIONS: None immediate. PROCEDURE: Informed written consent was obtained from the patient's husband after a thorough discussion of the procedural risks, benefits and alternatives. All questions were addressed. Maximal Sterile Barrier Technique was utilized including caps, mask, sterile gowns, sterile gloves, sterile drape, hand hygiene and skin antiseptic. A timeout was performed prior to the initiation of the procedure. Patient was placed prone on the interventional table. The left flank was prepped and draped in a sterile fashion. Skin anesthetized with 1% lidocaine. Left kidney was identified with ultrasound. Using ultrasound guidance, 21 gauge needle was directed into a mid/lower pole  calyx. Fluoroscopy confirmed that the needle was in the calyx because there was retained contrast from the recent chest CT. Wire was easily advanced into the renal collecting system. Accustick dilator set was placed. Tract was dilated to accommodate a 10 [place] multipurpose drain. Catheter was attached to gravity bag and sutured to skin. Small amount of blood tinged fluid was sent for culture. FINDINGS: Mild-to-moderate left hydronephrosis. Retained contrast in the collecting system from recent chest CT. Obstructing stone at the UPJ. Nephrostomy tube was successfully advanced into the kidney via a mid/lower pole calyx. Tube is reconstituted in the renal pelvis. Fluoroscopic and ultrasound images were taken and saved for documentation. IMPRESSION: Successful placement of a left percutaneous nephrostomy tube with ultrasound and fluoroscopic guidance. Electronically Signed   By: Markus Daft M.D.   On: 08/24/2016 15:52      Assessment & Plan: Ms. SHELBEE APGAR is a 66 y.o. black female with diabetes mellitus type II, hypertension, history of CVA, who was admitted to [facility] on 08/24/2016 for Hydronephrosis [N13.30] Flank pain [R10.9] Kidney stone on left side [N20.0] Septic shock ([place]) [A41.9, R65.21]  1. Acute renal failure with metabolic acidosis: baseline creatinine of 0.7 on 06/12/2016.  Oliguric urine output. Requiring vasopressors.  Needs renal replacement therapy. Discussed with husband at bedside.  - Plan on getting dialysis catheter and initiating continuous renal replacement therapy.  - Sodium bicarbonate gtt.   2. Septic Shock: Klebsiella. On pip/tazo. Afebrile. Leukocytosis 32.2 - vasopressors: norepinephrine, phenylephrine and vasopressin. - pip/tazo  3. Respiratory Failure requiring mechanical ventilation:    LOS: 1 Brianda Beitler 7/2/20189:32 AM

## 2016-08-25 NOTE — Progress Notes (Signed)
Inpatient Diabetes Program Recommendations  AACE/ADA: New Consensus Statement on Inpatient Glycemic Control (2015)  Target Ranges:  Prepandial:   less than 140 mg/dL      Peak postprandial:   less than 180 mg/dL (1-2 hours)      Critically ill patients:  140 - 180 mg/dL   Lab Results  Component Value Date   GLUCAP 268 (H) 08/25/2016    Review of Glycemic Control  Results for Megan Nixon, Megan Nixon (MRN [id number]) as of 08/25/2016 08:07  Ref. Range 08/24/2016 15:45 08/24/2016 18:49 08/24/2016 21:42 08/25/2016 00:17 08/25/2016 04:28  Glucose-Capillary Latest Ref Range: 65 - 99 mg/dL 41 (LL) 165 (H) 242 (H) 196 (H) 268 (H)    Diabetes history: Type 2  Outpatient Diabetes medications: Amaryl 69m qam, Metformin 5016mbid  Current orders for Inpatient glycemic control: ICU Glycemic Control order set, phase 2.   Continue IV insulin until there are 6 consecutive CBG CBG <18066ml.   Initiate Phase III (Transition ICU Glycemic Control Order Set) when all of the following are met: ?  the insulin infusion rate is < 4 units / hour ?  tube feeds are at goal rate and stable            there are 6 subsequent CBG readings < 180 mg/dL    Per ADA recommendations "consider performing an A1C on all patients with diabees or hyperglycemia admitted to the hospital if not performed in the prior 3 months".  JulGentry FitzN, BA, MHA, CDE Diabetes Coordinator Inpatient Diabetes Program  [phone] Pager) [phone]/03/2016 8:10 AM

## 2016-08-25 NOTE — Progress Notes (Signed)
Radiologist called to alert this RN that ETT is at the carina and needed to be withdrawn 2-3 . Ms. Luci Bankukov, NP was notified, Nadine CountsBob, RT came bedside and reposititioned ETT at 22 at lip.

## 2016-08-25 NOTE — Progress Notes (Signed)
Pt. UOP trending down, alerted Ms. Tukov NP to pt.'s UOP of 5 mL post IVF bolus. Bladder scanned pt. On request- no urine in bladder. Discussed CVP, VS. Will continue to monitor pt. Closely.

## 2016-08-25 NOTE — Progress Notes (Signed)
[employer] [employer] - [place] at [facility]   PATIENT NAME: Megan Nixon    MR#:  [id number]  DATE OF BIRTH:  01-29-1951  SUBJECTIVE:  CHIEF COMPLAINT:   Chief Complaint  Patient presents with  . Altered Mental Status   Patient intubated on ventilatory support. On pressors.  REVIEW OF SYSTEMS:    Review of Systems  Unable to perform ROS: Intubated    DRUG ALLERGIES:  No Known Allergies  VITALS:  Blood pressure 97/74, pulse (!) 114, temperature (!) 100.6 F (38.1 C), resp. rate 16, height 5\' 8"  (1.727 m), weight 89.6 kg (197 lb 8.5 oz), SpO2 (!) 88 %.  PHYSICAL EXAMINATION:   Physical Exam  GENERAL:  66 y.o.-year-old patient lying in the bed. Looks critically ill EYES: Pupils equal, round, reactive to light HEENT: Head atraumatic, normocephalic. Oropharynx and nasopharynx clear. ET tube in place NECK:  Supple, no jugular venous distention. No thyroid enlargement, no tenderness.  LUNGS: Bilateral coarse breath sounds CARDIOVASCULAR: S1, S2 normal. Tachycardia ABDOMEN: Soft, nontender, nondistended. Bowel sounds present. No organomegaly or mass. Foley catheter in place EXTREMITIES: No cyanosis, clubbing or edema b/l.     SKIN: No obvious rash, lesion, or ulcer.   LABORATORY PANEL:   CBC  Recent Labs Lab 08/25/16 0508  WBC 32.2*  HGB 11.3*  HCT 32.9*  PLT 32*   ------------------------------------------------------------------------------------------------------------------ Chemistries   Recent Labs Lab 08/25/16 0508  NA 140  K 3.7  CL 95*  CO2 18*  GLUCOSE 284*  BUN 37*  CREATININE 2.96*  CALCIUM 6.2*  MG 2.3  AST 139*  ALT 48  ALKPHOS 60  BILITOT 0.9   ------------------------------------------------------------------------------------------------------------------  Cardiac Enzymes  Recent Labs Lab 08/25/16 0508  TROPONINI 1.25*    ------------------------------------------------------------------------------------------------------------------  RADIOLOGY:  Dg Abd 1 View  Result Date: 08/25/2016 CLINICAL DATA:  Orogastric tube placement.  Initial encounter. EXAM: ABDOMEN - 1 VIEW COMPARISON:  CT of the abdomen and pelvis performed 08/24/2016 FINDINGS: The patient's enteric tube is noted coiled overlying the body of the stomach, ending at the antrum of the stomach. The visualized bowel gas pattern is grossly unremarkable. No free intra-abdominal air is seen, though evaluation for free air is limited on supine views. A pigtail catheter is noted overlying the left lower quadrant. No acute osseous abnormalities are seen. Small bilateral pleural effusions are noted. IMPRESSION: Enteric tube noted ending overlying the antrum of the stomach. Electronically Signed   By: Roanna Raider M.D.   On: 08/25/2016 05:43   Ct Head Wo Contrast  Result Date: 08/24/2016 CLINICAL DATA:  Altered mental status, confusion beginning yesterday, stroke, hypertension, diabetes mellitus EXAM: CT HEAD WITHOUT CONTRAST TECHNIQUE: Contiguous axial images were obtained from the base of the skull through the vertex without intravenous contrast. Sagittal and coronal MPR images reconstructed from axial data set. COMPARISON:  05/07/2014 FINDINGS: Brain: Mild generalized atrophy. Normal ventricular morphology. No midline shift or mass effect. Small old lacunar infarct LEFT thalamus. Minimal small vessel chronic ischemic changes of deep cerebral white matter. No intracranial hemorrhage, mass lesion or evidence of acute infarction. No extra-axial fluid collections. Vascular: Atherosclerotic calcification of internal carotid and vertebral arteries at skullbase Skull: Intact Sinuses/Orbits: Clear Other: N/A IMPRESSION: Atrophy with minimal small vessel chronic ischemic changes of deep cerebral white matter. 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 No acute intracranial  abnormalities. Electronically Signed   By: Ulyses Southward M.D.   On: 08/24/2016 09:17   Ct Angio Chest Pe W And/or Wo Contrast  Result Date:  08/24/2016 CLINICAL DATA:  Hypoxia, tachycardia EXAM: CT ANGIOGRAPHY CHEST WITH CONTRAST TECHNIQUE: Multidetector CT imaging of the chest was performed using the standard protocol during bolus administration of intravenous contrast. Multiplanar CT image reconstructions and MIPs were obtained to evaluate the vascular anatomy. CONTRAST:  60 cc Isovue 370 IV COMPARISON:  08/24/2016 FINDINGS: Cardiovascular: Cardiomegaly. No evidence of aortic aneurysm. No filling defects in the pulmonary arteries to suggest pulmonary emboli. Diffuse coronary artery calcifications. Mediastinum/Nodes: No mediastinal, hilar, or axillary adenopathy. Lungs/Pleura: Vascular congestion. Bibasilar atelectasis. No pleural effusions. Upper Abdomen: Imaging into the upper abdomen shows no acute findings. Musculoskeletal: Chest wall soft tissues are unremarkable. No acute bony abnormality. Review of the MIP images confirms the above findings. IMPRESSION: No evidence of pulmonary embolus. Cardiomegaly, coronary artery disease. Pulmonary vascular congestion. Bibasilar atelectasis. No evidence of pulmonary embolus. Electronically Signed   By: Charlett Nose M.D.   On: 08/24/2016 11:41   Dg Chest Port 1 View  Result Date: 08/25/2016 CLINICAL DATA:  Sepsis. EXAM: PORTABLE CHEST 1 VIEW COMPARISON:  08/24/2016. FINDINGS: Endotracheal tube tip is at the level of the carina. Proximal repositioning of approximately 3-4 cm suggested. Cardiomegaly with bilateral pulmonary infiltrates suggesting pulmonary edema. Bilateral pleural effusions. Bibasilar atelectasis. No pneumothorax. IMPRESSION: 1. Endotracheal tube tip is at the level of the carina. Proximal repositioning of approximately 3 4 cm suggested. 2. Cardiomegaly with bilateral pulmonary infiltrates suggesting pulmonary edema. Bilateral pleural effusions. 3.  Bibasilar atelectasis. Critical Value/emergent results were called by telephone at the time of interpretation on 08/25/2016 at 6:33 am to nurse [place] who verbally acknowledged these results. Electronically Signed   By: Maisie Fus  Register   On: 08/25/2016 06:35   Dg Chest Portable 1 View  Result Date: 08/24/2016 CLINICAL DATA:  Central line placement and intubation. EXAM: PORTABLE CHEST 1 VIEW COMPARISON:  CT of the chest 08/24/2016 FINDINGS: Patient is intubated. Endotracheal tube terminates 2 cm above the carina. The right internal jugular approach central venous catheter terminates in the expected location of the right atrium. Cardiomediastinal silhouette is enlarged, exaggerated by AP portable technique. There is no evidence of focal airspace consolidation, or pneumothorax. There is a possible left pleural effusion. Low lung volumes. Osseous structures are without acute abnormality. Soft tissues are grossly normal. IMPRESSION: Endotracheal tube terminates 2 cm above the carina. Right internal jugular approach central venous catheter tip overlies the expected location of the right atrium. The cardiac silhouette appears enlarged, which may be exaggerated by AP portable technique. Possible left pleural effusion versus prominent pericardial fat. Given recent instrumentation, decubital radiograph, left side down, should be considered to further evaluate whether or not there is left pleural fluid. Electronically Signed   By: Ted Mcalpine M.D.   On: 08/24/2016 14:30   Dg Chest Port 1 View  Result Date: 08/24/2016 CLINICAL DATA:  Altered mental status, confusion, prior stroke, hypertension, diabetes mellitus EXAM: PORTABLE CHEST 1 VIEW COMPARISON:  Portable exam 0734 hours without priors for comparison FINDINGS: Enlargement of cardiac silhouette with pulmonary vascular congestion. Mild RIGHT basilar atelectasis. Upper lungs clear. No pleural effusion or pneumothorax. Bones demineralized. IMPRESSION:  Enlargement of cardiac silhouette with minimal pulmonary vascular congestion. RIGHT basilar atelectasis. Electronically Signed   By: Ulyses Southward M.D.   On: 08/24/2016 07:58   Ct Renal Stone Study  Result Date: 08/24/2016 CLINICAL DATA:  Abdominal pain and UTI. EXAM: CT ABDOMEN AND PELVIS WITHOUT CONTRAST TECHNIQUE: Multidetector CT imaging of the abdomen and pelvis was performed following the standard protocol without IV contrast.  COMPARISON:  Chest CT, same date. FINDINGS: Lower chest: Persistent bibasilar atelectasis. Small right pleural effusion. The heart is mildly enlarged. Three-vessel coronary artery calcifications are noted. The distal esophagus is grossly normal. Hepatobiliary: Suspect cirrhotic changes involving the liver with portal venous hypertension and portal venous collaterals. No focal hepatic lesion. Gallstones are noted the gallbladder. Pancreas: Grossly normal without contrast. Spleen: Normal size. Adrenals/Urinary Tract: The adrenal glands are grossly normal. High-grade obstruction of the left kidney due to a 5.5 x 5.0 mm UPJ calculus. Perinephric interstitial changes and fluid are noted. I do not see any contrast below the calculus. The right kidney is unremarkable. No ureteral dilatation. The bladder is filled with contrast and appears normal. There is a left-sided bladder diverticulum noted. Stomach/Bowel: The stomach, duodenum, small bowel and colon are grossly normal. No obvious mass lesions or obstructive findings. Vascular/Lymphatic: Scattered distal aortic and iliac artery calcifications. No focal aneurysm. Numerous small upper abdominal lymph nodes likely related to cirrhosis. No retroperitoneal adenopathy. Reproductive: The uterus is surgically absent. I believe the right ovary is still present and appears normal. Other: Small amount of free abdominal/pelvic fluid. No pelvic mass or adenopathy. No inguinal mass or adenopathy. Musculoskeletal: No significant bony findings. Advanced  degenerative changes involving the spine. IMPRESSION: 1. 5.5 x 5.0 mm left UPJ calculus with high-grade obstructive findings. 2. Normal right kidney and ureter. The bladder is unremarkable except for a left-sided diverticulum. 3. Suspect cirrhotic changes involving the liver with portal venous hypertension and portal venous collaterals. 4. Cholelithiasis. Suspect gallbladder wall thickening which may be due to cirrhosis. 5. Moderate atherosclerotic calcifications involving the distal aorta and iliac arteries and coronary arteries. Electronically Signed   By: Rudie Meyer M.D.   On: 08/24/2016 12:44   Ir Nephrostomy Placement Left  Result Date: 08/24/2016 INDICATION: 66 year old with sepsis and obstructing stone at the left UPJ. EXAM: PLACEMENT OF LEFT PERCUTANEOUS NEPHROSTOMY TUBE WITH ULTRASOUND AND FLUOROSCOPIC GUIDANCE COMPARISON:  None. MEDICATIONS: Patient is on IV antibiotics. ANESTHESIA/SEDATION: Patient arrived to Intervention Radiology already sedated and intubated. The patient was continuously monitored during the procedure by the interventional radiology nurse under my direct supervision. CONTRAST:  5 mL - administered into the collecting system(s) FLUOROSCOPY TIME:  Fluoroscopy Time: 1 minutes 18 seconds COMPLICATIONS: None immediate. PROCEDURE: Informed written consent was obtained from the patient's husband after a thorough discussion of the procedural risks, benefits and alternatives. All questions were addressed. Maximal Sterile Barrier Technique was utilized including caps, mask, sterile gowns, sterile gloves, sterile drape, hand hygiene and skin antiseptic. A timeout was performed prior to the initiation of the procedure. Patient was placed prone on the interventional table. The left flank was prepped and draped in a sterile fashion. Skin anesthetized with 1% lidocaine. Left kidney was identified with ultrasound. Using ultrasound guidance, 21 gauge needle was directed into a mid/lower pole  calyx. Fluoroscopy confirmed that the needle was in the calyx because there was retained contrast from the recent chest CT. Wire was easily advanced into the renal collecting system. Accustick dilator set was placed. Tract was dilated to accommodate a 10 [place] multipurpose drain. Catheter was attached to gravity bag and sutured to skin. Small amount of blood tinged fluid was sent for culture. FINDINGS: Mild-to-moderate left hydronephrosis. Retained contrast in the collecting system from recent chest CT. Obstructing stone at the UPJ. Nephrostomy tube was successfully advanced into the kidney via a mid/lower pole calyx. Tube is reconstituted in the renal pelvis. Fluoroscopic and ultrasound images were taken and saved  for documentation. IMPRESSION: Successful placement of a left percutaneous nephrostomy tube with ultrasound and fluoroscopic guidance. Electronically Signed   By: Richarda OverlieAdam  Henn M.D.   On: 08/24/2016 15:52     ASSESSMENT AND PLAN:   * Septic shock * Klebsiella bacteremia * Left pyelonephritis with UPJ calculus and hydro-nephrosis * Acute kidney injury * ARDS * Acute hypoxic respiratory failure, ventilator dependent * DIC * Elevated troponin due to demand ischemia * Acute toxic and metabolic encephalopathy  Continue ICU care. Pressor support for map greater than 65. Patient is being started on CRRT. Closely monitor input and output. Discussed with Dr. Darrol AngelSimons of critical care. Continue full ventilatory support. Monitor blood work.  Patient is critically ill With high risk for cardiac arrest and death  All the records are reviewed and case discussed with Care Management/Social Workerr. Management plans discussed with the patient, family and they are in agreement.  CODE STATUS: FULL CODE  DVT Prophylaxis: SCDs  TOTAL TIME TAKING CARE OF THIS PATIENT: 25 minutes.    Milagros LollSudini, Levette Paulick R M.D on 08/25/2016 at 2:13 PM  Between 7am to 6pm - Pager - [phone]  After 6pm go to  [email or web address] - password [email or web address]  [facility] [place] [facility]  Office  [phone]  CC: Primary care physician; Danella PentonMiller, Mark F, MD  Note: This dictation was prepared with Dragon dictation along with smaller phrase technology. Any transcriptional errors that result from this process are unintentional.

## 2016-08-26 ENCOUNTER — Inpatient Hospital Stay: Payer: Medicare PPO

## 2016-08-26 DIAGNOSIS — N132 Hydronephrosis with renal and ureteral calculous obstruction: Secondary | ICD-10-CM

## 2016-08-26 LAB — BLOOD GAS, ARTERIAL
ACID-BASE DEFICIT: 13.3 mmol/L — AB (ref 0.0–2.0)
ACID-BASE DEFICIT: 16.2 mmol/L — AB (ref 0.0–2.0)
Acid-base deficit: 20.2 mmol/L — ABNORMAL HIGH (ref 0.0–2.0)
Bicarbonate: 10.8 mmol/L — ABNORMAL LOW (ref 20.0–28.0)
Bicarbonate: 13 mmol/L — ABNORMAL LOW (ref 20.0–28.0)
Bicarbonate: 8.7 mmol/L — ABNORMAL LOW (ref 20.0–28.0)
Delivery systems: POSITIVE
EXPIRATORY PAP: 5
FIO2: 0.4
FIO2: 0.8
INSPIRATORY PAP: 12
LHR: 20 {breaths}/min
O2 SAT: 95 %
O2 SAT: 89.5 %
O2 Saturation: 95.7 %
PATIENT TEMPERATURE: 37
PATIENT TEMPERATURE: 37
PCO2 ART: 29 mmHg — AB (ref 32.0–48.0)
PCO2 ART: 30 mmHg — AB (ref 32.0–48.0)
PEEP: 5 cmH2O
PEEP: 5 cmH2O
PO2 ART: 94 mmHg (ref 83.0–108.0)
Patient temperature: 37
RATE: 12 resp/min
VT: 500 mL
VT: 500 mL
pCO2 arterial: 31 mmHg — ABNORMAL LOW (ref 32.0–48.0)
pH, Arterial: 7.07 — CL (ref 7.350–7.450)
pH, Arterial: 7.18 — CL (ref 7.350–7.450)
pH, Arterial: 7.23 — ABNORMAL LOW (ref 7.350–7.450)
pO2, Arterial: 94 mmHg (ref 83.0–108.0)
pO2, Arterial: 81 mmHg — ABNORMAL LOW (ref 83.0–108.0)

## 2016-08-26 LAB — RENAL FUNCTION PANEL
ALBUMIN: 2.2 g/dL — AB (ref 3.5–5.0)
ANION GAP: 10 (ref 5–15)
Albumin: 2.3 g/dL — ABNORMAL LOW (ref 3.5–5.0)
Albumin: 2.3 g/dL — ABNORMAL LOW (ref 3.5–5.0)
Albumin: 2.4 g/dL — ABNORMAL LOW (ref 3.5–5.0)
Albumin: 2.3 g/dL — ABNORMAL LOW (ref 3.5–5.0)
Anion gap: 13 (ref 5–15)
Anion gap: 10 (ref 5–15)
Anion gap: 12 (ref 5–15)
Anion gap: 10 (ref 5–15)
BUN: 34 mg/dL — AB (ref 6–20)
BUN: 35 mg/dL — ABNORMAL HIGH (ref 6–20)
BUN: 33 mg/dL — AB (ref 6–20)
BUN: 33 mg/dL — AB (ref 6–20)
BUN: 37 mg/dL — ABNORMAL HIGH (ref 6–20)
CHLORIDE: 100 mmol/L — AB (ref 101–111)
CHLORIDE: 101 mmol/L (ref 101–111)
CO2: 27 mmol/L (ref 22–32)
CO2: 29 mmol/L (ref 22–32)
CO2: 26 mmol/L (ref 22–32)
CO2: 26 mmol/L (ref 22–32)
CO2: 27 mmol/L (ref 22–32)
CREATININE: 2.36 mg/dL — AB (ref 0.44–1.00)
CREATININE: 2.13 mg/dL — AB (ref 0.44–1.00)
Calcium: 6.9 mg/dL — ABNORMAL LOW (ref 8.9–10.3)
Calcium: 7.1 mg/dL — ABNORMAL LOW (ref 8.9–10.3)
Calcium: 7.1 mg/dL — ABNORMAL LOW (ref 8.9–10.3)
Calcium: 7.4 mg/dL — ABNORMAL LOW (ref 8.9–10.3)
Calcium: 7.9 mg/dL — ABNORMAL LOW (ref 8.9–10.3)
Chloride: 103 mmol/L (ref 101–111)
Chloride: 101 mmol/L (ref 101–111)
Chloride: 103 mmol/L (ref 101–111)
Creatinine, Ser: 2.41 mg/dL — ABNORMAL HIGH (ref 0.44–1.00)
Creatinine, Ser: 2.4 mg/dL — ABNORMAL HIGH (ref 0.44–1.00)
Creatinine, Ser: 2.12 mg/dL — ABNORMAL HIGH (ref 0.44–1.00)
GFR calc Af Amer: 23 mL/min — ABNORMAL LOW (ref >60)
GFR calc Af Amer: 23 mL/min — ABNORMAL LOW (ref >60)
GFR calc Af Amer: 26 mL/min — ABNORMAL LOW (ref >60)
GFR calc Af Amer: 26 mL/min — ABNORMAL LOW (ref >60)
GFR calc non Af Amer: 20 mL/min — ABNORMAL LOW (ref >60)
GFR calc non Af Amer: 23 mL/min — ABNORMAL LOW (ref >60)
GFR calc non Af Amer: 23 mL/min — ABNORMAL LOW (ref >60)
GFR, EST AFRICAN AMERICAN: 23 mL/min — AB (ref >60)
GFR, EST NON AFRICAN AMERICAN: 20 mL/min — AB (ref >60)
GFR, EST NON AFRICAN AMERICAN: 20 mL/min — AB (ref >60)
GLUCOSE: 178 mg/dL — AB (ref 65–99)
GLUCOSE: 234 mg/dL — AB (ref 65–99)
Glucose, Bld: 165 mg/dL — ABNORMAL HIGH (ref 65–99)
Glucose, Bld: 182 mg/dL — ABNORMAL HIGH (ref 65–99)
Glucose, Bld: 118 mg/dL — ABNORMAL HIGH (ref 65–99)
PHOSPHORUS: 3.3 mg/dL (ref 2.5–4.6)
PHOSPHORUS: 3.3 mg/dL (ref 2.5–4.6)
POTASSIUM: 3.7 mmol/L (ref 3.5–5.1)
POTASSIUM: 3.8 mmol/L (ref 3.5–5.1)
Phosphorus: 3.8 mg/dL (ref 2.5–4.6)
Phosphorus: 3.6 mg/dL (ref 2.5–4.6)
Phosphorus: 3.4 mg/dL (ref 2.5–4.6)
Potassium: 3.7 mmol/L (ref 3.5–5.1)
Potassium: 3.6 mmol/L (ref 3.5–5.1)
Potassium: 4 mmol/L (ref 3.5–5.1)
Sodium: 140 mmol/L (ref 135–145)
Sodium: 140 mmol/L (ref 135–145)
Sodium: 139 mmol/L (ref 135–145)
Sodium: 139 mmol/L (ref 135–145)
Sodium: 140 mmol/L (ref 135–145)

## 2016-08-26 LAB — PROCALCITONIN: Procalcitonin: >150 ng/mL

## 2016-08-26 LAB — URINE CULTURE: Culture: >=100000 — AB

## 2016-08-26 LAB — CBC WITH DIFFERENTIAL/PLATELET
BASOS PCT: 0 %
BLASTS: 0 %
Band Neutrophils: 17 %
Basophils Absolute: 0 10*3/uL (ref 0–0.1)
Eosinophils Absolute: 0 10*3/uL (ref 0–0.7)
Eosinophils Relative: 0 %
HEMATOCRIT: 32 % — AB (ref 35.0–47.0)
HEMOGLOBIN: 10.6 g/dL — AB (ref 12.0–16.0)
LYMPHS PCT: 3 %
Lymphs Abs: 1.2 10*3/uL (ref 1.0–3.6)
MCH: 28.9 pg (ref 26.0–34.0)
MCHC: 33.2 g/dL (ref 32.0–36.0)
MCV: 87.3 fL (ref 80.0–100.0)
MONO ABS: 3.2 10*3/uL — AB (ref 0.2–0.9)
Metamyelocytes Relative: 7 %
Monocytes Relative: 8 %
Myelocytes: 1 %
NEUTROS PCT: 64 %
NRBC: 0 /100{WBCs}
Neutro Abs: 35 10*3/uL — ABNORMAL HIGH (ref 1.4–6.5)
OTHER: 0 %
PROMYELOCYTES ABS: 0 %
Platelets: 18 10*3/uL — CL (ref 150–440)
RBC: 3.66 MIL/uL — ABNORMAL LOW (ref 3.80–5.20)
RDW: 16.5 % — ABNORMAL HIGH (ref 11.5–14.5)
WBC: 39.4 10*3/uL — ABNORMAL HIGH (ref 3.6–11.0)

## 2016-08-26 LAB — MAGNESIUM
MAGNESIUM: 1.8 mg/dL (ref 1.7–2.4)
MAGNESIUM: 1.8 mg/dL (ref 1.7–2.4)
Magnesium: 1.7 mg/dL (ref 1.7–2.4)
Magnesium: 1.9 mg/dL (ref 1.7–2.4)
Magnesium: 1.8 mg/dL (ref 1.7–2.4)

## 2016-08-26 LAB — GLUCOSE, CAPILLARY
GLUCOSE-CAPILLARY: 145 mg/dL — AB (ref 65–99)
GLUCOSE-CAPILLARY: 166 mg/dL — AB (ref 65–99)
GLUCOSE-CAPILLARY: 189 mg/dL — AB (ref 65–99)
GLUCOSE-CAPILLARY: 304 mg/dL — AB (ref 65–99)
GLUCOSE-CAPILLARY: 52 mg/dL — AB (ref 65–99)
GLUCOSE-CAPILLARY: 63 mg/dL — AB (ref 65–99)
Glucose-Capillary: 168 mg/dL — ABNORMAL HIGH (ref 65–99)
Glucose-Capillary: 160 mg/dL — ABNORMAL HIGH (ref 65–99)
Glucose-Capillary: 146 mg/dL — ABNORMAL HIGH (ref 65–99)
Glucose-Capillary: 191 mg/dL — ABNORMAL HIGH (ref 65–99)
Glucose-Capillary: 186 mg/dL — ABNORMAL HIGH (ref 65–99)
Glucose-Capillary: 163 mg/dL — ABNORMAL HIGH (ref 65–99)
Glucose-Capillary: 171 mg/dL — ABNORMAL HIGH (ref 65–99)
Glucose-Capillary: 229 mg/dL — ABNORMAL HIGH (ref 65–99)
Glucose-Capillary: 206 mg/dL — ABNORMAL HIGH (ref 65–99)

## 2016-08-26 LAB — BASIC METABOLIC PANEL
Anion gap: 11 (ref 5–15)
BUN: 34 mg/dL — AB (ref 6–20)
CHLORIDE: 101 mmol/L (ref 101–111)
CO2: 28 mmol/L (ref 22–32)
CREATININE: 2.38 mg/dL — AB (ref 0.44–1.00)
Calcium: 7.1 mg/dL — ABNORMAL LOW (ref 8.9–10.3)
GFR calc Af Amer: 23 mL/min — ABNORMAL LOW (ref >60)
GFR calc non Af Amer: 20 mL/min — ABNORMAL LOW (ref >60)
GLUCOSE: 164 mg/dL — AB (ref 65–99)
Potassium: 3.7 mmol/L (ref 3.5–5.1)
Sodium: 140 mmol/L (ref 135–145)

## 2016-08-26 LAB — PROTIME-INR
INR: 1.44
Prothrombin Time: 17.7 seconds — ABNORMAL HIGH (ref 11.4–15.2)

## 2016-08-26 LAB — FIBRINOGEN: Fibrinogen: 500 mg/dL — ABNORMAL HIGH (ref 210–475)

## 2016-08-26 LAB — C DIFFICILE QUICK SCREEN W PCR REFLEX
C DIFFICILE (CDIFF) TOXIN: NEGATIVE
C Diff antigen: NEGATIVE
C Diff interpretation: NOT DETECTED

## 2016-08-26 LAB — PHOSPHORUS: Phosphorus: 3.6 mg/dL (ref 2.5–4.6)

## 2016-08-26 LAB — APTT: aPTT: 40 seconds — ABNORMAL HIGH (ref 24–36)

## 2016-08-26 LAB — GLUCOSE, RANDOM: GLUCOSE: 136 mg/dL — AB (ref 65–99)

## 2016-08-26 LAB — FIBRIN DERIVATIVES D-DIMER (ARMC ONLY): Fibrin derivatives D-dimer (ARMC): >7500 — ABNORMAL HIGH (ref 0.00–499.00)

## 2016-08-26 MED ORDER — PUREFLOW DIALYSIS SOLUTION
INTRAVENOUS | Status: DC
  Administered 2016-08-26 – 2016-08-27 (×3): via INTRAVENOUS_CENTRAL
  Administered 2016-08-28: 3 via INTRAVENOUS_CENTRAL
  Administered 2016-08-28 – 2016-08-29 (×3): via INTRAVENOUS_CENTRAL
  Administered 2016-08-29 – 2016-08-30 (×3): 3 via INTRAVENOUS_CENTRAL

## 2016-08-26 MED ORDER — DEXTROSE 5 % IV SOLN
2.0000 g | INTRAVENOUS | Status: DC
  Administered 2016-08-26 – 2016-08-27 (×2): 2 g via INTRAVENOUS
  Filled 2016-08-26 (×4): qty 2

## 2016-08-26 MED ORDER — INSULIN GLARGINE 100 UNIT/ML ~~LOC~~ SOLN
10.0000 [IU] | Freq: Two times a day (BID) | SUBCUTANEOUS | Status: DC
  Administered 2016-08-26 – 2016-08-31 (×11): 10 [IU] via SUBCUTANEOUS
  Filled 2016-08-26 (×13): qty 0.1

## 2016-08-26 MED ORDER — INSULIN ASPART 100 UNIT/ML ~~LOC~~ SOLN
0.0000 [IU] | SUBCUTANEOUS | Status: DC
  Administered 2016-08-26: 5 [IU] via SUBCUTANEOUS
  Administered 2016-08-26: 3 [IU] via SUBCUTANEOUS
  Administered 2016-08-26 – 2016-08-27 (×2): 5 [IU] via SUBCUTANEOUS
  Filled 2016-08-26 (×4): qty 1

## 2016-08-26 MED ORDER — VITAL HIGH PROTEIN PO LIQD
1000.0000 mL | ORAL | Status: DC
  Administered 2016-08-26 – 2016-08-28 (×3): 1000 mL

## 2016-08-26 NOTE — Progress Notes (Addendum)
PULMONARY / CRITICAL CARE MEDICINE   Name: Megan Nixon MRN: [id number] DOB: 12/01/1950    ADMISSION DATE:  08/24/2016  PT PROFILE:   66 y.o. F with hx of DM, hypertension admitted with severe sepsis/septic shock due to pyelonephritis, left hydronephrosis, klebsiella bacteremia  MAJOR EVENTS/TEST RESULTS: 07/01 admitted to ICU via emergency department as above 07/01 CT renal stone study: 5.5 x 5.0 mm left UPJ calculus with high-grade obstructive findings 07/01 CTA chest: No PE, patchy bilateral infiltrates 07/01 urology consultation Griffin Dakin): PCN placement recommended. Recommended 14 day course of antibiotics for complicated UTI. "Can plan definitive stone treatment once infection has been treated (between 2 and 6 weeks post PCN placement in most cases)"  07/01 left PCN tube placed by IR 07/02 echocardiogram: LVEF 55-60% 07/02 nephrology consultation - CRRT ordered 07/03 decreasing vasopressor requirements. Remains on CRRT. Severe DIC persists  INDWELLING DEVICES:: ETT 07/01 >>  R IJ CVL 07/01 >>  R radial art line 07/01 >>  L femoral HD cath 07/02 >>   MICRO DATA: MRSA PCR 07/01 >> NEG Urine 07/01 >> GNRs > Resp 07/01 >>  Blood 07/01 >> 2/2 Klebsiella (resistant only to ampicillin)  ANTIMICROBIALS:  Vanc 07/01 >> 07/01 Pip-tazo 07/01 >> 07/02 Ceftriaxone 07/02 >>    SUBJECTIVE:  RASS -3, intubated, sedated  VITAL SIGNS: BP (!) 89/60   Pulse 89   Temp 98.4 F (36.9 C)   Resp 16   Ht 5\' 8"  (1.727 m)   Wt 197 lb 8.5 oz (89.6 kg)   SpO2 96%   BMI 30.03 kg/m   HEMODYNAMICS:    VENTILATOR SETTINGS: Vent Mode: PRVC FiO2 (%):  [40 %-60 %] 40 % Set Rate:  [16 bmp] 16 bmp Vt Set:  [500 mL] 500 mL PEEP:  [5 cmH20] 5 cmH20 Plateau Pressure:  [22 cmH20] 22 cmH20  INTAKE / OUTPUT: I/O last 3 completed shifts: In: 10096.5 [I.V.:6138.3; Blood:240; Other:75; NG/GT:1233.2; IV Piggyback:2410] Out: 492 [Urine:392; Emesis/NG output:100]  PHYSICAL  EXAMINATION: General: Intubated, sedated, RASS -3 Neuro: PERRLA, EOMI, MAEs HEENT: NCAT, sclerae white. Mild scleral edema Cardiovascular: Tachycardia, regular, no murmurs Lungs: No wheezes Abdomen: Soft, diminished bowel sounds, no masses Ext: Both hands cool with acrocyanosis, no edema, diminished pedal pulses  LABS:  BMET  Recent Labs Lab 08/25/16 2113 08/26/16 0230 08/26/16 0532 08/26/16 0725  NA 141 140 140  140  --   K 3.5 3.7 3.7  3.7  --   CL 99* 100* 101  101  --   CO2 29 27 28  29   --   BUN 35* 34* 34*  35*  --   CREATININE 2.63* 2.41* 2.38*  2.40*  --   GLUCOSE 153* 178* 164*  165* 136*    Electrolytes  Recent Labs Lab 08/25/16 2113 08/26/16 0230 08/26/16 0532 08/26/16 0916  CALCIUM 6.7* 6.9* 7.1*  7.1*  --   MG 1.9 1.8 1.7 1.8  PHOS 3.8 3.8 3.6  3.6  --     CBC  Recent Labs Lab 08/24/16 2118 08/25/16 0508 08/26/16 0532  WBC 34.1* 32.2* 39.4*  HGB 11.9* 11.3* 10.6*  HCT 36.1 32.9* 32.0*  PLT 54* 32* 18*    Coag's  Recent Labs Lab 08/24/16 1544 08/24/16 2117 08/24/16 2118 08/25/16 0847 08/26/16 0532  APTT 68* 52*  --   --  40*  INR 2.22  --  2.17 2.20 1.44    Sepsis Markers  Recent Labs Lab 08/24/16 1544 08/24/16 2117 08/25/16 0515 08/25/16 0847  LATICACIDVEN  --  14.0* 14.7* 15.2*  PROCALCITON >150.00  --   --   --     ABG  Recent Labs Lab 08/24/16 2213 08/25/16 0046 08/25/16 0508  PHART 7.14* 7.23* 7.35  PCO2ART 28* 31* 35  PO2ART 101 94 75*    Liver Enzymes  Recent Labs Lab 08/24/16 0919 08/24/16 2115 08/25/16 0508  08/25/16 2113 08/26/16 0230 08/26/16 0532  AST 83* 94* 139*  --   --   --   --   ALT 44 43 48  --   --   --   --   ALKPHOS 78 79 60  --   --   --   --   BILITOT 0.9 1.0 0.9  --   --   --   --   ALBUMIN 3.2* 2.7* 2.3*  < > 2.4* 2.3* 2.3*  < > = values in this interval not displayed.  Cardiac Enzymes  Recent Labs Lab 08/24/16 1600 08/24/16 2116 08/25/16 0508  TROPONINI  0.13* 0.77* 1.25*    Glucose  Recent Labs Lab 08/26/16 0526 08/26/16 0631 08/26/16 0713 08/26/16 0715 08/26/16 0717 08/26/16 0904  GLUCAP 160* 146* 25* 63* 52* 191*    CXR:  NSC mild edema versus ARDS pattern   ASSESSMENT / PLAN:  PULMONARY A: Acute hypoxemic respiratory failure ARDS/ALI Ventilator dependence P:   Cont vent support - settings reviewed and/or adjusted Cont vent bundle Daily SBT when meets criteria   CARDIOVASCULAR A:  Septic shock - vasopressor requirements improving Minimally elevated troponin I PRWP on EKG P:  Continue vasopressors to maintain M AP > 65 mmHg Wean phenylephrine first, then norepinephrine Will need cardiology evaluation after critical phase of this illness has resolved Follow-up echocardiogram performed 07/02  RENAL A:   AKI Lactic acidosis P:   Monitor BMET intermittently Monitor I/Os Correct electrolytes as indicated Nephrology following. Continue CRRT UF 50 mL per hour initiated 07/03  GASTROINTESTINAL A:   No issues P:   SUP: IV famotidine Continue TFs - initiated 07/02  HEMATOLOGIC A:   Severe DIC with thrombocytopenia and coagulopathy P:  DVT px: SCDs Monitor CBC intermittently Transfuse per usual guidelines Try to avoid platelets and FFP transfusions which might exacerbate DIC  INFECTIOUS A:   L  pyelonephritis/hydronephrosis Klebsiella bacteremia Severe sepsis P:   Monitor temp, WBC count Micro and abx as above  ENDOCRINE A:   DM 2, controlled Relative adrenal insufficiency P:   Transition to Lantus/SSI 07/03 Continue stress dose hydrocortisone  NEUROLOGIC A:   Acute septic encephalopathy ICU/vent associated discomfort P:   RASS goal: -2,-3 Continue PAD protocol   FAMILY: Daughter updated at bedside    CCM time: 40 mins The above time includes time spent in consultation with patient and/or family members and reviewing care plan on multidisciplinary rounds  Billy Fischeravid Simonds,  MD PCCM service Mobile [phone] Pager [phone] 08/26/2016 10:59 AM

## 2016-08-26 NOTE — Plan of Care (Signed)
Problem: Nutrition: Goal: Adequate nutrition will be maintained Outcome: Progressing Increased patients tube feeds to 75 ml per hour.

## 2016-08-26 NOTE — Progress Notes (Signed)
Pharmacy Consult for CRRT Medication Adjustment  No Known Allergies  Patient Measurements: Height: 5\' 8"  (172.7 cm) Weight: 197 lb 8.5 oz (89.6 kg) IBW/kg (Calculated) : 63.9  Vital Signs: Temp: 97.3 F (36.3 C) (07/03 1200) Temp Source: Core (Comment) (07/03 0400) BP: 92/60 (07/03 1200) Pulse Rate: 87 (07/03 1100) Intake/Output from previous day: 07/02 0701 - 07/03 0700 In: 4286.3 [I.V.:2718.1; NG/GT:1233.2; IV Piggyback:260] Out: 175 [Urine:175] Intake/Output from this shift: Total I/O In: -  Out: 94 [Other:94]  Labs:  Recent Labs  08/24/16 0919  08/24/16 1544  08/24/16 2115 08/24/16 2117  08/24/16 2118 08/25/16 0508  08/26/16 0230 08/26/16 0532 08/26/16 0916 08/26/16 1232  WBC  --   --   --   < >  --   --   --  34.1* 32.2*  --   --  39.4*  --   --   HGB  --   --   --   < >  --   --   --  11.9* 11.3*  --   --  10.6*  --   --   HCT  --   --   --   < >  --   --   --  36.1 32.9*  --   --  32.0*  --   --   PLT  --   --   --   < >  --   --   --  54* 32*  --   --  18*  --   --   APTT  --   < > 68*  --   --  52*  --   --   --   --   --  40*  --   --   CREATININE 1.91*  --   --   < > 2.62*  --   --   --  2.96*  < > 2.41* 2.38*  2.40* 2.36* 2.13*  MG  --   --   --   --   --   --   < > 1.1* 2.3  < > 1.8 1.7 1.8  --   PHOS  --   --   --   --   --   --   < > 6.5* 6.4*  < > 3.8 3.6  3.6 3.3 3.4  ALBUMIN 3.2*  --   --   --  2.7*  --   --   --  2.3*  < > 2.3* 2.3* 2.2* 2.4*  PROT 6.7  --   --   --  5.8*  --   --   --  4.8*  --   --   --   --   --   AST 83*  --   --   --  94*  --   --   --  139*  --   --   --   --   --   ALT 44  --   --   --  43  --   --   --  48  --   --   --   --   --   ALKPHOS 78  --   --   --  79  --   --   --  60  --   --   --   --   --   BILITOT 0.9  --   --   --  1.0  --   --   --  0.9  --   --   --   --   --   < > = values in this interval not displayed. Estimated Creatinine Clearance: 29.6 mL/min (A) (by C-G formula based on SCr of 2.13 mg/dL  (H)).  Medical History: Past Medical History:  Diagnosis Date  . Diabetes mellitus without complication (HCC)   . Hypertension   . Stroke [facility])     Assessment: 66 y/o F with a h/o DM, HTN, and CVA admitted with septic shock due to pyelonephritis. Patient is now requiring CRRT.   Plan:  No medications require adjustment at present. Pharmacy will continue to monitor and adjust per consult.   Luisa Hart D 08/26/2016,2:07 PM

## 2016-08-26 NOTE — Progress Notes (Signed)
Inpatient Diabetes Program Recommendations  AACE/ADA: New Consensus Statement on Inpatient Glycemic Control (2015)  Target Ranges:  Prepandial:   less than 140 mg/dL      Peak postprandial:   less than 180 mg/dL (1-2 hours)      Critically ill patients:  140 - 180 mg/dL   Lab Results  Component Value Date   GLUCAP 52 (L) 08/26/2016    Review of Glycemic Control  Results for Megan FerdinandSTANFIELD, Megan S (MRN [id number]) as of 08/26/2016 09:19  Ref. Range 08/26/2016 06:31 08/26/2016 07:13 08/26/2016 07:15 08/26/2016 07:17 08/26/2016 09:04  Glucose-Capillary Latest Ref Range: 65 - 99 mg/dL 621146 (H) 25 (LL) 63 (L) 52 (L) 191 (H)    Diabetes history: Type 2  Outpatient Diabetes medications: Amaryl 1mg  qam, Metformin 500mg  bid  Current orders for Inpatient glycemic control: ICU Glycemic Control order set, phase 2.    Inpatient Diabetes Program Recommendations:     From side bar; If  TF / TNA  is discontinued, enter original multiplier If CBG < 70  ? Follow GlucoStabilizer Instructions ? Recheck POCT CBG in 15 minutes ? Change multiplier to 0.01  Notify MD if CBG<70 after treating hypoglycemia twice  * Note low CBG and lab glucose 136mg /dl.    Agree with current orders for blood sugar management- maintain patient on IV insulin.   Susette RacerJulie Careem Yasui, RN, BA, MHA, CDE Diabetes Coordinator Inpatient Diabetes Program  [phone] (Team Pager) [phone] [facility] Office) 08/26/2016 9:21 AM

## 2016-08-26 NOTE — Progress Notes (Signed)
Nutrition Follow-up  DOCUMENTATION CODES:   Obesity unspecified  INTERVENTION:  1. Increase Vital HP to 6675mL/hr, provides 1800 calories, 158gm protein, 1705cc free water Continue free water 50mL q6h  NUTRITION DIAGNOSIS:   Inadequate oral intake related to inability to eat (patient ventilated ) as evidenced by NPO status. -ongoing  GOAL:   Provide needs based on ASPEN/SCCM guidelines -progressing  MONITOR:   Diet advancement, Vent status, Labs, Weight trends, TF tolerance  REASON FOR ASSESSMENT:   Consult Enteral/tube feeding initiation and management  ASSESSMENT:   66 y.o. black female with diabetes mellitus type II, hypertension, history of CVA, who was [facility] for hydronephrosis, acute renal failure with metabolic acidosis and septic shock secondary to left kidney stone. Pt intubated r/t acute respiratory failure and now s/p nephrostomy tube placement 7/1.   Patient is currently intubated on ventilator support MV: 8 L/min Temp (24hrs), Avg:98.2 F (36.8 C), Min:96.8 F (36 C), Max:100.6 F (38.1 C) Propofol: none Continues on CRRT  Intake/Output Summary (Last 24 hours) at 08/26/16 1251 Last data filed at 08/26/16 1100  Gross per 24 hour  Intake          3076.22 ml  Output              269 ml  Net          2807.22 ml   Severe DIC OGT, CVC Line Labs and medications reviewed: BUN/Creatinine: 33/2.36, CBGs 16,10,96063,52,191 Fentanyl gtt, Levo gtt, Vaso gtt  Diet Order:     Skin:  Reviewed, no issues  Last BM:  none since admit   Height:   Ht Readings from Last 1 Encounters:  08/24/16 5\' 8"  (1.727 m)    Weight:   Wt Readings from Last 1 Encounters:  08/24/16 197 lb 8.5 oz (89.6 kg)    Ideal Body Weight:  63.6 kg  BMI:  Body mass index is 30.03 kg/m.  Estimated Nutritional Needs:   Kcal:  1750-1850kcal/day   Protein:  134-223 grams/day (1.5-2.5g/kg)  Fluid:  >1.7L/day or per MD  EDUCATION NEEDS:   Education needs no  appropriate at this time  Dionne AnoWilliam M. Cornie Herrington, MS, RD LDN Inpatient Clinical Dietitian Pager [phone]

## 2016-08-26 NOTE — Progress Notes (Signed)
[employer] - [place] [facility]   PATIENT NAME: Megan JacobsonSarah Nixon    MR#:  [id number]  DATE OF BIRTH:  July 29, 1950  SUBJECTIVE:  CHIEF COMPLAINT:   Chief Complaint  Patient presents with  . Altered Mental Status   ON vent support Pressors. CRRT Anuric No family at bedside  REVIEW OF SYSTEMS:    Review of Systems  Unable to perform ROS: Intubated   DRUG ALLERGIES:  No Known Allergies  VITALS:  Blood pressure 92/60, pulse 87, temperature (!) 97.3 F (36.3 C), resp. rate 16, height 5\' 8"  (1.727 m), weight 89.6 kg (197 lb 8.5 oz), SpO2 96 %.  PHYSICAL EXAMINATION:   Physical Exam  GENERAL:  66 y.o.-year-old patient lying in the bed. Looks critically ill EYES: Pupils equal, round, reactive to light HEENT: Head atraumatic, normocephalic. Oropharynx and nasopharynx clear. ET tube in place NECK:  Supple, no jugular venous distention. No thyroid enlargement, no tenderness.  LUNGS: Bilateral coarse breath sounds CARDIOVASCULAR: S1, S2 normal. Tachycardia ABDOMEN: Soft, nontender, nondistended. Bowel sounds present. No organomegaly or mass. Foley catheter in place EXTREMITIES: No cyanosis, clubbing or edema b/l.      LABORATORY PANEL:   CBC  Recent Labs Lab 08/26/16 0532  WBC 39.4*  HGB 10.6*  HCT 32.0*  PLT 18*   ------------------------------------------------------------------------------------------------------------------ Chemistries   Recent Labs Lab 08/25/16 0508  08/26/16 0916 08/26/16 1232  NA 140  < > 139 139  K 3.7  < > 3.6 4.0  CL 95*  < > 103 101  CO2 18*  < > 26 26  GLUCOSE 284*  < > 182* 118*  BUN 37*  < > 33* 33*  CREATININE 2.96*  < > 2.36* 2.13*  CALCIUM 6.2*  < > 7.1* 7.4*  MG 2.3  < > 1.8  --   AST 139*  --   --   --   ALT 48  --   --   --   ALKPHOS 60  --   --   --   BILITOT 0.9  --   --   --   < > = values in this interval not  displayed. ------------------------------------------------------------------------------------------------------------------  Cardiac Enzymes  Recent Labs Lab 08/25/16 0508  TROPONINI 1.25*   ------------------------------------------------------------------------------------------------------------------  RADIOLOGY:  Dg Abd 1 View  Result Date: 08/25/2016 CLINICAL DATA:  Orogastric tube placement.  Initial encounter. EXAM: ABDOMEN - 1 VIEW COMPARISON:  CT of the abdomen and pelvis performed 08/24/2016 FINDINGS: The patient's enteric tube is noted coiled overlying the body of the stomach, ending at the antrum of the stomach. The visualized bowel gas pattern is grossly unremarkable. No free intra-abdominal air is seen, though evaluation for free air is limited on supine views. A pigtail catheter is noted overlying the left lower quadrant. No acute osseous abnormalities are seen. Small bilateral pleural effusions are noted. IMPRESSION: Enteric tube noted ending overlying the antrum of the stomach. Electronically Signed   By: Roanna RaiderJeffery  Chang M.D.   On: 08/25/2016 05:43   Dg Chest Port 1 View  Result Date: 08/26/2016 CLINICAL DATA:  Respiratory failure. EXAM: PORTABLE CHEST 1 VIEW COMPARISON:  08/25/2016 . FINDINGS: Endotracheal tube has been slightly withdrawn, its tip is 1.9 cm above the carina. NG tube, right IJ line stable position. Cardiomegaly with bilateral pulmonary interstitial prominence suggesting interstitial edema. Small bilateral pleural effusions. No significant change from prior exam. No pneumothorax . IMPRESSION: 1. Endotracheal tube is been slightly withdrawn, its tip is 1.9 cm above the  carina. NG tube and right IJ line stable position. 2. Cardiomegaly with bilateral interstitial prominence suggesting interstitial edema. Small bilateral pleural effusions. No significant change from prior exam. Electronically Signed   By: Maisie Fus  Register   On: 08/26/2016 06:54   Dg Chest Port 1  View  Result Date: 08/25/2016 CLINICAL DATA:  Sepsis. EXAM: PORTABLE CHEST 1 VIEW COMPARISON:  08/24/2016. FINDINGS: Endotracheal tube tip is at the level of the carina. Proximal repositioning of approximately 3-4 cm suggested. Cardiomegaly with bilateral pulmonary infiltrates suggesting pulmonary edema. Bilateral pleural effusions. Bibasilar atelectasis. No pneumothorax. IMPRESSION: 1. Endotracheal tube tip is at the level of the carina. Proximal repositioning of approximately 3 4 cm suggested. 2. Cardiomegaly with bilateral pulmonary infiltrates suggesting pulmonary edema. Bilateral pleural effusions. 3. Bibasilar atelectasis. Critical Value/emergent results were called by telephone at the time of interpretation on 08/25/2016 at 6:33 am to nurse [place] who verbally acknowledged these results. Electronically Signed   By: Maisie Fus  Register   On: 08/25/2016 06:35   Dg Chest Portable 1 View  Result Date: 08/24/2016 CLINICAL DATA:  Central line placement and intubation. EXAM: PORTABLE CHEST 1 VIEW COMPARISON:  CT of the chest 08/24/2016 FINDINGS: Patient is intubated. Endotracheal tube terminates 2 cm above the carina. The right internal jugular approach central venous catheter terminates in the expected location of the right atrium. Cardiomediastinal silhouette is enlarged, exaggerated by AP portable technique. There is no evidence of focal airspace consolidation, or pneumothorax. There is a possible left pleural effusion. Low lung volumes. Osseous structures are without acute abnormality. Soft tissues are grossly normal. IMPRESSION: Endotracheal tube terminates 2 cm above the carina. Right internal jugular approach central venous catheter tip overlies the expected location of the right atrium. The cardiac silhouette appears enlarged, which may be exaggerated by AP portable technique. Possible left pleural effusion versus prominent pericardial fat. Given recent instrumentation, decubital radiograph, left side down,  should be considered to further evaluate whether or not there is left pleural fluid. Electronically Signed   By: Ted Mcalpine M.D.   On: 08/24/2016 14:30   Ir Nephrostomy Placement Left  Result Date: 08/24/2016 INDICATION: 66 year old with sepsis and obstructing stone at the left UPJ. EXAM: PLACEMENT OF LEFT PERCUTANEOUS NEPHROSTOMY TUBE WITH ULTRASOUND AND FLUOROSCOPIC GUIDANCE COMPARISON:  None. MEDICATIONS: Patient is on IV antibiotics. ANESTHESIA/SEDATION: Patient arrived to Intervention Radiology already sedated and intubated. The patient was continuously monitored during the procedure by the interventional radiology nurse under my direct supervision. CONTRAST:  5 mL - administered into the collecting system(s) FLUOROSCOPY TIME:  Fluoroscopy Time: 1 minutes 18 seconds COMPLICATIONS: None immediate. PROCEDURE: Informed written consent was obtained from the patient's husband after a thorough discussion of the procedural risks, benefits and alternatives. All questions were addressed. Maximal Sterile Barrier Technique was utilized including caps, mask, sterile gowns, sterile gloves, sterile drape, hand hygiene and skin antiseptic. A timeout was performed prior to the initiation of the procedure. Patient was placed prone on the interventional table. The left flank was prepped and draped in a sterile fashion. Skin anesthetized with 1% lidocaine. Left kidney was identified with ultrasound. Using ultrasound guidance, 21 gauge needle was directed into a mid/lower pole calyx. Fluoroscopy confirmed that the needle was in the calyx because there was retained contrast from the recent chest CT. Wire was easily advanced into the renal collecting system. Accustick dilator set was placed. Tract was dilated to accommodate a 10 [place] multipurpose drain. Catheter was attached to gravity bag and sutured to skin. Small amount of  blood tinged fluid was sent for culture. FINDINGS: Mild-to-moderate left hydronephrosis.  Retained contrast in the collecting system from recent chest CT. Obstructing stone at the UPJ. Nephrostomy tube was successfully advanced into the kidney via a mid/lower pole calyx. Tube is reconstituted in the renal pelvis. Fluoroscopic and ultrasound images were taken and saved for documentation. IMPRESSION: Successful placement of a left percutaneous nephrostomy tube with ultrasound and fluoroscopic guidance. Electronically Signed   By: Richarda Overlie M.D.   On: 08/24/2016 15:52     ASSESSMENT AND PLAN:   * Septic shock * Klebsiella bacteremia * Left pyelonephritis with UPJ calculus and hydro-nephrosis * Acute kidney injury * ARDS * Acute hypoxic respiratory failure, ventilator dependent * DIC * Elevated troponin due to demand ischemia * Acute toxic and metabolic encephalopathy  Continue ICU care. Pressor support for map greater than 65.  On CRRT Continue full ventilatory support. Monitor blood work. Transfuse PRN IV ceftriaxone  Patient is critically ill With high risk for cardiac arrest and death  All the records are reviewed and case discussed with Care Management/Social Workerr. Management plans discussed with the patient, family and they are in agreement.  CODE STATUS: FULL CODE  DVT Prophylaxis: SCDs  TOTAL TIME TAKING CARE OF THIS PATIENT: 25 minutes.    Milagros Loll R M.D on 08/26/2016 at 1:01 PM  Between 7am to 6pm - Pager - [phone]  After 6pm go to [email or web address] - password [email or web address]  [facility]  Office  [phone]  CC: Primary care physician; Danella Penton, MD  Note: This dictation was prepared with Dragon dictation along with smaller phrase technology. Any transcriptional errors that result from this process are unintentional.

## 2016-08-26 NOTE — Progress Notes (Signed)
[facility]  ROUNDING NOTE   Subjective:   Daughter and sister at bedside. Spoke to Husband earlier today.  CRRT started yesterday. UOP 175. Net +[phone]  Objective:  Vital signs in last 24 hours:  Temp:  [96.8 F (36 C)-100.6 F (38.1 C)] 98.4 F (36.9 C) (07/03 1000) Pulse Rate:  [89-117] 89 (07/03 0900) Resp:  [16-21] 16 (07/03 1000) BP: (68-118)/(48-78) 89/60 (07/03 1000) SpO2:  [88 %-99 %] 96 % (07/03 0900) Arterial Line BP: (71-131)/(39-70) 117/67 (07/03 0600) FiO2 (%):  [40 %-60 %] 40 % (07/03 0742)  Weight change:  Filed Weights   08/24/16 0725 08/24/16 1547  Weight: 85.8 kg (189 lb 3.2 oz) 89.6 kg (197 lb 8.5 oz)    Intake/Output: I/O last 3 completed shifts: In: 10096.5 [I.V.:6138.3; Blood:240; Other:75; NG/GT:1233.2; IV Piggyback:2410] Out: 492 [Urine:392; Emesis/NG output:100]   Intake/Output this shift:  No intake/output data recorded.  Physical Exam: General: Critically ill   Head: ETT  Eyes: Anicteric, PERRL  Neck:  trachea midline  Lungs:  PRVC FiO 40%  Heart: tachycardia  Abdomen:  Soft, nontender, obese  Extremities: + peripheral edema.  Neurologic: Intubated, sedated  Skin: No lesions  Access: Right femoral temp HD catheter 7/2 Dr. Alva Garnet    Basic Metabolic Panel:  Recent Labs Lab 08/25/16 9892 08/25/16 1609 08/25/16 2113 08/26/16 0230 08/26/16 0532 08/26/16 0725 08/26/16 0916  NA 140 142 141 140 140  140  --   --   K 3.7 3.1* 3.5 3.7 3.7  3.7  --   --   CL 95* 98* 99* 100* 101  101  --   --   CO2 18* _0 --   --   GLUCOSE 284* 183* 153* 178* 164*  165* 136*  --   BUN 37* 38* 35* 34* 34*  35*  --   --   CREATININE 2.96* 2.82* 2.63* 2.41* 2.38*  2.40*  --   --   CALCIUM 6.2* 6.1* 6.7* 6.9* 7.1*  7.1*  --   --   MG 2.3 2.0 1.9 1.8 1.7  --  1.8  PHOS 6.4* 4.0 3.8 3.8 3.6  3.6  --   --     Liver Function Tests:  Recent Labs Lab 08/24/16 0919 08/24/16 2115 08/25/16 0508 08/25/16 1609  08/25/16 2113 08/26/16 0230 08/26/16 0532  AST 83* 94* 139*  --   --   --   --   ALT 44 43 48  --   --   --   --   ALKPHOS 78 79 60  --   --   --   --   BILITOT 0.9 1.0 0.9  --   --   --   --   PROT 6.7 5.8* 4.8*  --   --   --   --   ALBUMIN 3.2* 2.7* 2.3* 2.3* 2.4* 2.3* 2.3*   No results for input(s): LIPASE, AMYLASE in the last 168 hours. No results for input(s): AMMONIA in the last 168 hours.  CBC:  Recent Labs Lab 08/24/16 0824 08/24/16 1600 08/24/16 2118 08/25/16 0508 08/26/16 0532  WBC 14.8* 23.4* 34.1* 32.2* 39.4*  NEUTROABS  --  21.3*  --  27.1* 35.0*  HGB 13.0 11.1* 11.9* 11.3* 10.6*  HCT 39.5 34.7* 36.1 32.9* 32.0*  MCV 88.9 91.9 91.3 88.6 87.3  PLT 87* 69* 54* 32* 18*    Cardiac Enzymes:  Recent Labs Lab 08/24/16 0919 08/24/16 1544 08/24/16 1600  08/24/16 2116 08/25/16 0508  TROPONINI 0.04* 0.13* 0.13* 0.77* 1.25*    BNP: Invalid input(s): POCBNP  CBG:  Recent Labs Lab 08/26/16 0631 08/26/16 0713 08/26/16 0715 08/26/16 0717 08/26/16 0904  GLUCAP 146* 25* 63* 57* 191*    Microbiology: Results for orders placed or performed during the hospital encounter of 08/24/16  Blood Culture (routine x 2)     Status: None (Preliminary result)   Collection Time: 08/24/16  8:25 AM  Result Value Ref Range Status   Specimen Description BLOOD RIGHT ANTECUBITAL  Final   Special Requests   Final    BOTTLES DRAWN AEROBIC AND ANAEROBIC Blood Culture adequate volume   Culture  Setup Time   Final    GRAM NEGATIVE RODS IN BOTH AEROBIC AND ANAEROBIC BOTTLES CRITICAL RESULT CALLED TO, READ BACK BY AND VERIFIED WITH: JASON ROBBINS ON 08/24/16 AT 1933 QSD    Culture GRAM NEGATIVE RODS  Final   Report Status PENDING  Incomplete  Blood Culture (routine x 2)     Status: Abnormal (Preliminary result)   Collection Time: 08/24/16  8:25 AM  Result Value Ref Range Status   Specimen Description BLOOD RIGHT ANTECUBITAL  Final   Special Requests   Final    BOTTLES DRAWN  AEROBIC AND ANAEROBIC Blood Culture adequate volume   Culture  Setup Time   Final    GRAM NEGATIVE RODS IN BOTH AEROBIC AND ANAEROBIC BOTTLES CRITICAL RESULT CALLED TO, READ BACK BY AND VERIFIED WITH: JASON ROBBINS ON 08/24/16 AT 1933 QSD    Culture (A)  Final    KLEBSIELLA PNEUMONIAE SUSCEPTIBILITIES TO FOLLOW Performed at [facility], [place] [address], [place],  [address]    Report Status PENDING  Incomplete  Blood Culture ID Panel (Reflexed)     Status: Abnormal   Collection Time: 08/24/16  8:25 AM  Result Value Ref Range Status   Enterococcus species NOT DETECTED NOT DETECTED Final   Listeria monocytogenes NOT DETECTED NOT DETECTED Final   Staphylococcus species NOT DETECTED NOT DETECTED Final   Staphylococcus aureus NOT DETECTED NOT DETECTED Final   Streptococcus species NOT DETECTED NOT DETECTED Final   Streptococcus agalactiae NOT DETECTED NOT DETECTED Final   Streptococcus pneumoniae NOT DETECTED NOT DETECTED Final   Streptococcus pyogenes NOT DETECTED NOT DETECTED Final   Acinetobacter baumannii NOT DETECTED NOT DETECTED Final   Enterobacteriaceae species DETECTED (A) NOT DETECTED Final    Comment: Enterobacteriaceae represent a large family of gram-negative bacteria, not a single organism. CRITICAL RESULT CALLED TO, READ BACK BY AND VERIFIED WITH: JASON ROBBINS ON 08/24/16 AT 1933 QSD    Enterobacter cloacae complex NOT DETECTED NOT DETECTED Final   Escherichia coli NOT DETECTED NOT DETECTED Final   Klebsiella oxytoca NOT DETECTED NOT DETECTED Final   Klebsiella pneumoniae DETECTED (A) NOT DETECTED Final    Comment: CRITICAL RESULT CALLED TO, READ BACK BY AND VERIFIED WITH: JASON ROBBINS ON 08/24/16 AT 1933 QSD    Proteus species NOT DETECTED NOT DETECTED Final   Serratia marcescens NOT DETECTED NOT DETECTED Final   Carbapenem resistance NOT DETECTED NOT DETECTED Final   Haemophilus influenzae NOT DETECTED NOT DETECTED Final   Neisseria meningitidis NOT  DETECTED NOT DETECTED Final   Pseudomonas aeruginosa NOT DETECTED NOT DETECTED Final   Candida albicans NOT DETECTED NOT DETECTED Final   Candida glabrata NOT DETECTED NOT DETECTED Final   Candida krusei NOT DETECTED NOT DETECTED Final   Candida parapsilosis NOT DETECTED NOT DETECTED Final  Candida tropicalis NOT DETECTED NOT DETECTED Final  Urine culture     Status: Abnormal   Collection Time: 08/24/16  9:19 AM  Result Value Ref Range Status   Specimen Description URINE, CATHETERIZED  Final   Special Requests NONE  Final   Culture >=100,000 COLONIES/mL KLEBSIELLA PNEUMONIAE (A)  Final   Report Status 08/26/2016 FINAL  Final   Organism ID, Bacteria KLEBSIELLA PNEUMONIAE (A)  Final      Susceptibility   Klebsiella pneumoniae - MIC*    AMPICILLIN >=32 RESISTANT Resistant     CEFAZOLIN <=4 SENSITIVE Sensitive     CEFTRIAXONE <=1 SENSITIVE Sensitive     CIPROFLOXACIN <=0.25 SENSITIVE Sensitive     GENTAMICIN <=1 SENSITIVE Sensitive     IMIPENEM <=0.25 SENSITIVE Sensitive     NITROFURANTOIN 64 INTERMEDIATE Intermediate     TRIMETH/SULFA <=20 SENSITIVE Sensitive     AMPICILLIN/SULBACTAM 4 SENSITIVE Sensitive     PIP/TAZO <=4 SENSITIVE Sensitive     Extended ESBL NEGATIVE Sensitive     * >=100,000 COLONIES/mL KLEBSIELLA PNEUMONIAE  Body fluid culture     Status: None (Preliminary result)   Collection Time: 08/24/16  3:30 PM  Result Value Ref Range Status   Specimen Description FLUID  Final   Special Requests LEFT NEPHROSTOMY  Final   Gram Stain   Final    WBC PRESENT, PREDOMINANTLY PMN GRAM NEGATIVE RODS CYTOSPIN SMEAR    Culture   Final    FEW GRAM NEGATIVE RODS CULTURE REINCUBATED FOR BETTER GROWTH Performed at [facility], [address], [place], [place] [address]    Report Status PENDING  Incomplete  MRSA PCR Screening     Status: None   Collection Time: 08/24/16  3:44 PM  Result Value Ref Range Status   MRSA by PCR NEGATIVE NEGATIVE Final    Comment:         The GeneXpert MRSA Assay (FDA approved for NASAL specimens only), is one component of a comprehensive MRSA colonization surveillance program. It is not intended to diagnose MRSA infection nor to guide or monitor treatment for MRSA infections.   Culture, respiratory (NON-Expectorated)     Status: None (Preliminary result)   Collection Time: 08/25/16  7:00 AM  Result Value Ref Range Status   Specimen Description TRACHEAL ASPIRATE  Final   Special Requests NONE  Final   Gram Stain   Final    FEW WBC PRESENT, PREDOMINANTLY PMN FEW SQUAMOUS EPITHELIAL CELLS PRESENT NO ORGANISMS SEEN    Culture   Final    CULTURE REINCUBATED FOR BETTER GROWTH Performed at [facility], [place] [address] [place], [place] [address]    Report Status PENDING  Incomplete    Coagulation Studies:  Recent Labs  08/24/16 1317 08/24/16 1544 08/24/16 2118 08/25/16 0847 08/26/16 0532  LABPROT 23.4* 25.0* 24.5* 24.8* 17.7*  INR 2.05 2.22 2.17 2.20 1.44    Urinalysis:  Recent Labs  08/24/16 1004  COLORURINE AMBER*  LABSPEC 1.019  PHURINE 5.0  GLUCOSEU NEGATIVE  HGBUR SMALL*  BILIRUBINUR NEGATIVE  KETONESUR 5*  PROTEINUR 100*  NITRITE POSITIVE*  LEUKOCYTESUR NEGATIVE      Imaging: Dg Abd 1 View  Result Date: 08/25/2016 CLINICAL DATA:  Orogastric tube placement.  Initial encounter. EXAM: ABDOMEN - 1 VIEW COMPARISON:  CT of the abdomen and pelvis performed 08/24/2016 FINDINGS: The patient's enteric tube is noted coiled overlying the body of the stomach, ending at the antrum of the stomach. The visualized bowel gas pattern  is grossly unremarkable. No free intra-abdominal air is seen, though evaluation for free air is limited on supine views. A pigtail catheter is noted overlying the left lower quadrant. No acute osseous abnormalities are seen. Small bilateral pleural effusions are noted. IMPRESSION: Enteric tube noted ending overlying the antrum of the stomach. Electronically Signed   By:  Garald Balding M.D.   On: 08/25/2016 05:43   Ct Angio Chest Pe W And/or Wo Contrast  Result Date: 08/24/2016 CLINICAL DATA:  Hypoxia, tachycardia EXAM: CT ANGIOGRAPHY CHEST WITH CONTRAST TECHNIQUE: Multidetector CT imaging of the chest was performed using the standard protocol during bolus administration of intravenous contrast. Multiplanar CT image reconstructions and MIPs were obtained to evaluate the vascular anatomy. CONTRAST:  60 cc Isovue 370 IV COMPARISON:  08/24/2016 FINDINGS: Cardiovascular: Cardiomegaly. No evidence of aortic aneurysm. No filling defects in the pulmonary arteries to suggest pulmonary emboli. Diffuse coronary artery calcifications. Mediastinum/Nodes: No mediastinal, hilar, or axillary adenopathy. Lungs/Pleura: Vascular congestion. Bibasilar atelectasis. No pleural effusions. Upper Abdomen: Imaging into the upper abdomen shows no acute findings. Musculoskeletal: Chest wall soft tissues are unremarkable. No acute bony abnormality. Review of the MIP images confirms the above findings. IMPRESSION: No evidence of pulmonary embolus. Cardiomegaly, coronary artery disease. Pulmonary vascular congestion. Bibasilar atelectasis. No evidence of pulmonary embolus. Electronically Signed   By: Rolm Baptise M.D.   On: 08/24/2016 11:41   Dg Chest Port 1 View  Result Date: 08/26/2016 CLINICAL DATA:  Respiratory failure. EXAM: PORTABLE CHEST 1 VIEW COMPARISON:  08/25/2016 . FINDINGS: Endotracheal tube has been slightly withdrawn, its tip is 1.9 cm above the carina. NG tube, right IJ line stable position. Cardiomegaly with bilateral pulmonary interstitial prominence suggesting interstitial edema. Small bilateral pleural effusions. No significant change from prior exam. No pneumothorax . IMPRESSION: 1. Endotracheal tube is been slightly withdrawn, its tip is 1.9 cm above the carina. NG tube and right IJ line stable position. 2. Cardiomegaly with bilateral interstitial prominence suggesting interstitial  edema. Small bilateral pleural effusions. No significant change from prior exam. Electronically Signed   By: [place]   On: 08/26/2016 06:54   Dg Chest Port 1 View  Result Date: 08/25/2016 CLINICAL DATA:  Sepsis. EXAM: PORTABLE CHEST 1 VIEW COMPARISON:  08/24/2016. FINDINGS: Endotracheal tube tip is at the level of the carina. Proximal repositioning of approximately 3-4 cm suggested. Cardiomegaly with bilateral pulmonary infiltrates suggesting pulmonary edema. Bilateral pleural effusions. Bibasilar atelectasis. No pneumothorax. IMPRESSION: 1. Endotracheal tube tip is at the level of the carina. Proximal repositioning of approximately 3 4 cm suggested. 2. Cardiomegaly with bilateral pulmonary infiltrates suggesting pulmonary edema. Bilateral pleural effusions. 3. Bibasilar atelectasis. Critical Value/emergent results were called by telephone at the time of interpretation on 08/25/2016 at 6:33 am to nurse [place] who verbally acknowledged these results. Electronically Signed   By: Marcello Moores  Register   On: 08/25/2016 06:35   Dg Chest Portable 1 View  Result Date: 08/24/2016 CLINICAL DATA:  Central line placement and intubation. EXAM: PORTABLE CHEST 1 VIEW COMPARISON:  CT of the chest 08/24/2016 FINDINGS: Patient is intubated. Endotracheal tube terminates 2 cm above the carina. The right internal jugular approach central venous catheter terminates in the expected location of the right atrium. Cardiomediastinal silhouette is enlarged, exaggerated by AP portable technique. There is no evidence of focal airspace consolidation, or pneumothorax. There is a possible left pleural effusion. Low lung volumes. Osseous structures are without acute abnormality. Soft tissues are grossly normal. IMPRESSION: Endotracheal tube terminates 2 cm above the  carina. Right internal jugular approach central venous catheter tip overlies the expected location of the right atrium. The cardiac silhouette appears enlarged, which may be  exaggerated by AP portable technique. Possible left pleural effusion versus prominent pericardial fat. Given recent instrumentation, decubital radiograph, left side down, should be considered to further evaluate whether or not there is left pleural fluid. Electronically Signed   By: Fidela Salisbury M.D.   On: 08/24/2016 14:30   Ct Renal Stone Study  Result Date: 08/24/2016 CLINICAL DATA:  Abdominal pain and UTI. EXAM: CT ABDOMEN AND PELVIS WITHOUT CONTRAST TECHNIQUE: Multidetector CT imaging of the abdomen and pelvis was performed following the standard protocol without IV contrast. COMPARISON:  Chest CT, same date. FINDINGS: Lower chest: Persistent bibasilar atelectasis. Small right pleural effusion. The heart is mildly enlarged. Three-vessel coronary artery calcifications are noted. The distal esophagus is grossly normal. Hepatobiliary: Suspect cirrhotic changes involving the liver with portal venous hypertension and portal venous collaterals. No focal hepatic lesion. Gallstones are noted the gallbladder. Pancreas: Grossly normal without contrast. Spleen: Normal size. Adrenals/Urinary Tract: The adrenal glands are grossly normal. High-grade obstruction of the left kidney due to a 5.5 x 5.0 mm UPJ calculus. Perinephric interstitial changes and fluid are noted. I do not see any contrast below the calculus. The right kidney is unremarkable. No ureteral dilatation. The bladder is filled with contrast and appears normal. There is a left-sided bladder diverticulum noted. Stomach/Bowel: The stomach, duodenum, small bowel and colon are grossly normal. No obvious mass lesions or obstructive findings. Vascular/Lymphatic: Scattered distal aortic and iliac artery calcifications. No focal aneurysm. Numerous small upper abdominal lymph nodes likely related to cirrhosis. No retroperitoneal adenopathy. Reproductive: The uterus is surgically absent. I believe the right ovary is still present and appears normal. Other: Small  amount of free abdominal/pelvic fluid. No pelvic mass or adenopathy. No inguinal mass or adenopathy. Musculoskeletal: No significant bony findings. Advanced degenerative changes involving the spine. IMPRESSION: 1. 5.5 x 5.0 mm left UPJ calculus with high-grade obstructive findings. 2. Normal right kidney and ureter. The bladder is unremarkable except for a left-sided diverticulum. 3. Suspect cirrhotic changes involving the liver with portal venous hypertension and portal venous collaterals. 4. Cholelithiasis. Suspect gallbladder wall thickening which may be due to cirrhosis. 5. Moderate atherosclerotic calcifications involving the distal aorta and iliac arteries and coronary arteries. Electronically Signed   By: Marijo Sanes M.D.   On: 08/24/2016 12:44   Ir Nephrostomy Placement Left  Result Date: 08/24/2016 INDICATION: 66 year old with sepsis and obstructing stone at the left UPJ. EXAM: PLACEMENT OF LEFT PERCUTANEOUS NEPHROSTOMY TUBE WITH ULTRASOUND AND FLUOROSCOPIC GUIDANCE COMPARISON:  None. MEDICATIONS: Patient is on IV antibiotics. ANESTHESIA/SEDATION: Patient arrived to Intervention Radiology already sedated and intubated. The patient was continuously monitored during the procedure by the interventional radiology nurse under my direct supervision. CONTRAST:  5 mL - administered into the collecting system(s) FLUOROSCOPY TIME:  Fluoroscopy Time: 1 minutes 18 seconds COMPLICATIONS: None immediate. PROCEDURE: Informed written consent was obtained from the patient's husband after a thorough discussion of the procedural risks, benefits and alternatives. All questions were addressed. Maximal Sterile Barrier Technique was utilized including caps, mask, sterile gowns, sterile gloves, sterile drape, hand hygiene and skin antiseptic. A timeout was performed prior to the initiation of the procedure. Patient was placed prone on the interventional table. The left flank was prepped and draped in a sterile fashion. Skin  anesthetized with 1% lidocaine. Left kidney was identified with ultrasound. Using ultrasound guidance, 21 gauge needle  was directed into a mid/lower pole calyx. Fluoroscopy confirmed that the needle was in the calyx because there was retained contrast from the recent chest CT. Wire was easily advanced into the renal collecting system. Accustick dilator set was placed. Tract was dilated to accommodate a 10 [place] multipurpose drain. Catheter was attached to gravity bag and sutured to skin. Small amount of blood tinged fluid was sent for culture. FINDINGS: Mild-to-moderate left hydronephrosis. Retained contrast in the collecting system from recent chest CT. Obstructing stone at the UPJ. Nephrostomy tube was successfully advanced into the kidney via a mid/lower pole calyx. Tube is reconstituted in the renal pelvis. Fluoroscopic and ultrasound images were taken and saved for documentation. IMPRESSION: Successful placement of a left percutaneous nephrostomy tube with ultrasound and fluoroscopic guidance. Electronically Signed   By: Markus Daft M.D.   On: 08/24/2016 15:52     Medications:   . cefTRIAXone (ROCEPHIN)  IV    . famotidine (PEPCID) IV Stopped (08/25/16 1612)  . fentaNYL infusion INTRAVENOUS 200 mcg/hr (08/25/16 2239)  . insulin (NOVOLIN-R) infusion 7.3 Units/hr (08/26/16 0912)  . norepinephrine (LEVOPHED) Adult infusion 20 mcg/min (08/26/16 0901)  . pureflow 2,000 mL/hr at 08/25/16 1330  . vasopressin (PITRESSIN) infusion - *FOR SHOCK* 0.03 Units/min (08/26/16 0911)   . chlorhexidine gluconate (MEDLINE KIT)  15 mL Mouth Rinse BID  . feeding supplement (VITAL HIGH PROTEIN)  1,000 mL Per Tube Q24H  . free water  50 mL Per Tube Q6H  . hydrocortisone sod succinate (SOLU-CORTEF) inj  50 mg Intravenous Q6H  . insulin aspart  0-15 Units Subcutaneous Q4H  . insulin glargine  10 Units Subcutaneous BID  . mouth rinse  15 mL Mouth Rinse 10 times per day  . sodium chloride flush  10-40 mL Intracatheter  Q12H   acetaminophen **OR** [DISCONTINUED] acetaminophen, albuterol, bisacodyl, fentaNYL (SUBLIMAZE) injection, midazolam, ondansetron **OR** ondansetron (ZOFRAN) IV, senna-docusate, sodium chloride flush  Assessment/ Plan:  Ms. ELIZ NIGG is a 66 y.o. black female Ms. SHALONDRA WUNSCHEL is a 66 y.o. black female with diabetes mellitus type II, hypertension, history of CVA, who was admitted to [facility] on 08/24/2016 for Hydronephrosis [N13.30] Flank pain [R10.9] Kidney stone on left side [N20.0] Septic shock ([place]) [A41.9, R65.21]  1. Acute renal failure with metabolic acidosis: baseline creatinine of 0.7 on 06/12/2016.  Oliguric urine output. Requiring vasopressors.  Acute renal failure secondary obstructive uropathy, hypotension and ATN.  - Continue CVVHD. Start UF of 34m/hr  2. Septic Shock: Klebsiella. On pip/tazo. Afebrile. Leukocytosis 39.4. Klebsiella species.  - vasopressors: norepinephrine and vasopressin. Off phenylephrine - hydrocortisone - ceftriaxone  3. Respiratory Failure requiring mechanical ventilation:  - Continue supportive care   LOS: 2 Johnie Stadel 7/3/201810:37 AM

## 2016-08-27 ENCOUNTER — Inpatient Hospital Stay: Payer: Medicare PPO

## 2016-08-27 LAB — RENAL FUNCTION PANEL
ALBUMIN: 2.2 g/dL — AB (ref 3.5–5.0)
ANION GAP: 8 (ref 5–15)
ANION GAP: 9 (ref 5–15)
ANION GAP: 8 (ref 5–15)
Albumin: 2.1 g/dL — ABNORMAL LOW (ref 3.5–5.0)
Albumin: 2.2 g/dL — ABNORMAL LOW (ref 3.5–5.0)
Albumin: 2.3 g/dL — ABNORMAL LOW (ref 3.5–5.0)
Anion gap: 7 (ref 5–15)
BUN: 37 mg/dL — ABNORMAL HIGH (ref 6–20)
BUN: 39 mg/dL — AB (ref 6–20)
BUN: 39 mg/dL — AB (ref 6–20)
BUN: 43 mg/dL — ABNORMAL HIGH (ref 6–20)
CALCIUM: 7.2 mg/dL — AB (ref 8.9–10.3)
CALCIUM: 8 mg/dL — AB (ref 8.9–10.3)
CHLORIDE: 104 mmol/L (ref 101–111)
CO2: 26 mmol/L (ref 22–32)
CO2: 26 mmol/L (ref 22–32)
CO2: 26 mmol/L (ref 22–32)
CO2: 27 mmol/L (ref 22–32)
CREATININE: 2.07 mg/dL — AB (ref 0.44–1.00)
CREATININE: 2.06 mg/dL — AB (ref 0.44–1.00)
Calcium: 7.9 mg/dL — ABNORMAL LOW (ref 8.9–10.3)
Calcium: 8 mg/dL — ABNORMAL LOW (ref 8.9–10.3)
Chloride: 107 mmol/L (ref 101–111)
Chloride: 103 mmol/L (ref 101–111)
Chloride: 104 mmol/L (ref 101–111)
Creatinine, Ser: 1.82 mg/dL — ABNORMAL HIGH (ref 0.44–1.00)
Creatinine, Ser: 1.96 mg/dL — ABNORMAL HIGH (ref 0.44–1.00)
GFR calc Af Amer: 27 mL/min — ABNORMAL LOW (ref >60)
GFR calc Af Amer: 32 mL/min — ABNORMAL LOW (ref >60)
GFR calc Af Amer: 29 mL/min — ABNORMAL LOW (ref >60)
GFR calc non Af Amer: 24 mL/min — ABNORMAL LOW (ref >60)
GFR calc non Af Amer: 25 mL/min — ABNORMAL LOW (ref >60)
GFR, EST AFRICAN AMERICAN: 27 mL/min — AB (ref >60)
GFR, EST NON AFRICAN AMERICAN: 23 mL/min — AB (ref >60)
GFR, EST NON AFRICAN AMERICAN: 27 mL/min — AB (ref >60)
GLUCOSE: 276 mg/dL — AB (ref 65–99)
GLUCOSE: 228 mg/dL — AB (ref 65–99)
GLUCOSE: 221 mg/dL — AB (ref 65–99)
Glucose, Bld: 230 mg/dL — ABNORMAL HIGH (ref 65–99)
PHOSPHORUS: 3.3 mg/dL (ref 2.5–4.6)
PHOSPHORUS: 2.8 mg/dL (ref 2.5–4.6)
POTASSIUM: 4 mmol/L (ref 3.5–5.1)
Phosphorus: 2.7 mg/dL (ref 2.5–4.6)
Phosphorus: 2.6 mg/dL (ref 2.5–4.6)
Potassium: 3.6 mmol/L (ref 3.5–5.1)
Potassium: 3.9 mmol/L (ref 3.5–5.1)
Potassium: 4 mmol/L (ref 3.5–5.1)
SODIUM: 141 mmol/L (ref 135–145)
SODIUM: 138 mmol/L (ref 135–145)
Sodium: 137 mmol/L (ref 135–145)
Sodium: 139 mmol/L (ref 135–145)

## 2016-08-27 LAB — MAGNESIUM
MAGNESIUM: 1.8 mg/dL (ref 1.7–2.4)
Magnesium: 1.9 mg/dL (ref 1.7–2.4)
Magnesium: 1.9 mg/dL (ref 1.7–2.4)
Magnesium: 1.9 mg/dL (ref 1.7–2.4)
Magnesium: 1.8 mg/dL (ref 1.7–2.4)

## 2016-08-27 LAB — CULTURE, BLOOD (ROUTINE X 2)
SPECIAL REQUESTS: ADEQUATE
Special Requests: ADEQUATE

## 2016-08-27 LAB — GLUCOSE, CAPILLARY
GLUCOSE-CAPILLARY: 255 mg/dL — AB (ref 65–99)
GLUCOSE-CAPILLARY: 20 mg/dL — AB (ref 65–99)
GLUCOSE-CAPILLARY: 258 mg/dL — AB (ref 65–99)
Glucose-Capillary: 153 mg/dL — ABNORMAL HIGH (ref 65–99)
Glucose-Capillary: 212 mg/dL — ABNORMAL HIGH (ref 65–99)
Glucose-Capillary: 231 mg/dL — ABNORMAL HIGH (ref 65–99)
Glucose-Capillary: 246 mg/dL — ABNORMAL HIGH (ref 65–99)
Glucose-Capillary: 303 mg/dL — ABNORMAL HIGH (ref 65–99)
Glucose-Capillary: <10 mg/dL — CL (ref 65–99)

## 2016-08-27 LAB — CBC WITH DIFFERENTIAL/PLATELET
BASOS ABS: 0 10*3/uL (ref 0–0.1)
BASOS PCT: 0 %
Eosinophils Absolute: 0 10*3/uL (ref 0–0.7)
Eosinophils Relative: 0 %
HEMATOCRIT: 30.1 % — AB (ref 35.0–47.0)
Hemoglobin: 9.9 g/dL — ABNORMAL LOW (ref 12.0–16.0)
LYMPHS PCT: 7 %
Lymphs Abs: 2.2 10*3/uL (ref 1.0–3.6)
MCH: 28.6 pg (ref 26.0–34.0)
MCHC: 32.8 g/dL (ref 32.0–36.0)
MCV: 87.2 fL (ref 80.0–100.0)
MONOS PCT: 3 %
Monocytes Absolute: 0.9 10*3/uL (ref 0.2–0.9)
NEUTROS ABS: 28.2 10*3/uL — AB (ref 1.4–6.5)
Neutrophils Relative %: 90 %
Platelets: 18 10*3/uL — CL (ref 150–440)
RBC: 3.45 MIL/uL — ABNORMAL LOW (ref 3.80–5.20)
RDW: 16.9 % — AB (ref 11.5–14.5)
WBC: 31.3 10*3/uL — ABNORMAL HIGH (ref 3.6–11.0)

## 2016-08-27 LAB — COMPREHENSIVE METABOLIC PANEL
ALBUMIN: 2.2 g/dL — AB (ref 3.5–5.0)
ALK PHOS: 98 U/L (ref 38–126)
ALT: 55 U/L — AB (ref 14–54)
ANION GAP: 8 (ref 5–15)
AST: 69 U/L — AB (ref 15–41)
BUN: 40 mg/dL — AB (ref 6–20)
CALCIUM: 7.6 mg/dL — AB (ref 8.9–10.3)
CO2: 27 mmol/L (ref 22–32)
Chloride: 103 mmol/L (ref 101–111)
Creatinine, Ser: 1.97 mg/dL — ABNORMAL HIGH (ref 0.44–1.00)
GFR calc Af Amer: 29 mL/min — ABNORMAL LOW (ref >60)
GFR calc non Af Amer: 25 mL/min — ABNORMAL LOW (ref >60)
GLUCOSE: 276 mg/dL — AB (ref 65–99)
Potassium: 4 mmol/L (ref 3.5–5.1)
SODIUM: 138 mmol/L (ref 135–145)
Total Bilirubin: 0.7 mg/dL (ref 0.3–1.2)
Total Protein: 5.5 g/dL — ABNORMAL LOW (ref 6.5–8.1)

## 2016-08-27 LAB — CULTURE, RESPIRATORY: CULTURE: NORMAL

## 2016-08-27 LAB — PHOSPHORUS: Phosphorus: 3.2 mg/dL (ref 2.5–4.6)

## 2016-08-27 LAB — CULTURE, RESPIRATORY W GRAM STAIN

## 2016-08-27 LAB — APTT: aPTT: 33 seconds (ref 24–36)

## 2016-08-27 MED ORDER — DEXTROSE 50 % IV SOLN
INTRAVENOUS | Status: AC
  Administered 2016-08-27: 08:00:00
  Filled 2016-08-27: qty 50

## 2016-08-27 MED ORDER — INSULIN ASPART 100 UNIT/ML ~~LOC~~ SOLN
0.0000 [IU] | SUBCUTANEOUS | Status: DC
  Administered 2016-08-27: 15 [IU] via SUBCUTANEOUS
  Administered 2016-08-27: 11 [IU] via SUBCUTANEOUS
  Administered 2016-08-27: 7 [IU] via SUBCUTANEOUS
  Administered 2016-08-27: 4 [IU] via SUBCUTANEOUS
  Administered 2016-08-27: 7 [IU] via SUBCUTANEOUS
  Administered 2016-08-27: 11 [IU] via SUBCUTANEOUS
  Administered 2016-08-28: 4 [IU] via SUBCUTANEOUS
  Administered 2016-08-28: 7 [IU] via SUBCUTANEOUS
  Administered 2016-08-28: 4 [IU] via SUBCUTANEOUS
  Administered 2016-08-28: 3 [IU] via SUBCUTANEOUS
  Administered 2016-08-28: 4 [IU] via SUBCUTANEOUS
  Administered 2016-08-28: 7 [IU] via SUBCUTANEOUS
  Administered 2016-08-29 – 2016-08-30 (×5): 4 [IU] via SUBCUTANEOUS
  Administered 2016-08-30 (×3): 3 [IU] via SUBCUTANEOUS
  Administered 2016-08-30: 4 [IU] via SUBCUTANEOUS
  Administered 2016-08-31: 7 [IU] via SUBCUTANEOUS
  Administered 2016-08-31 – 2016-09-02 (×6): 3 [IU] via SUBCUTANEOUS
  Administered 2016-09-02: 4 [IU] via SUBCUTANEOUS
  Administered 2016-09-02 – 2016-09-03 (×2): 3 [IU] via SUBCUTANEOUS
  Filled 2016-08-27 (×32): qty 1

## 2016-08-27 MED ORDER — PANTOPRAZOLE SODIUM 40 MG PO PACK
40.0000 mg | PACK | Freq: Every day | ORAL | Status: DC
  Administered 2016-08-27 – 2016-08-31 (×5): 40 mg
  Filled 2016-08-27 (×5): qty 20

## 2016-08-27 MED ORDER — DEXTROSE 50 % IV SOLN: 1.0000 | Freq: Once | INTRAVENOUS | Status: DC

## 2016-08-27 MED ORDER — HYDROCORTISONE NA SUCCINATE PF 100 MG IJ SOLR
50.0000 mg | Freq: Two times a day (BID) | INTRAMUSCULAR | Status: DC
  Administered 2016-08-27 – 2016-08-28 (×2): 50 mg via INTRAVENOUS
  Filled 2016-08-27 (×2): qty 2

## 2016-08-27 NOTE — Progress Notes (Signed)
CRRT paused. Cartridge replaced.

## 2016-08-27 NOTE — Progress Notes (Signed)
PULMONARY / CRITICAL CARE MEDICINE   Name: Megan Nixon MRN: [id number] DOB: Sep 20, 1950    ADMISSION DATE:  08/24/2016  PT PROFILE:   66 y.o. F with hx of DM, hypertension admitted with severe sepsis/septic shock due to pyelonephritis, left hydronephrosis, klebsiella bacteremia  MAJOR EVENTS/TEST RESULTS: 07/01 admitted to ICU via emergency department as above 07/01 CT renal stone study: 5.5 x 5.0 mm left UPJ calculus with high-grade obstructive findings 07/01 CTA chest: No PE, patchy bilateral infiltrates 07/01 urology consultation Griffin Dakin): PCN placement recommended. Recommended 14 day course of antibiotics for complicated UTI. "Can plan definitive stone treatment once infection has been treated (between 2 and 6 weeks post PCN placement in most cases)"  07/01 left PCN tube placed by IR 07/02 echocardiogram: LVEF 55-60% 07/02 nephrology consultation - CRRT ordered 07/03 decreasing vasopressor requirements. Remains on CRRT. Severe DIC persists 07/04 Off vasopressin. Low dose norepinephrine. Awakens but not F/C. DIC persists  INDWELLING DEVICES:: ETT 07/01 >>  R IJ CVL 07/01 >>  R radial art line 07/01 >>  L femoral HD cath 07/02 >>   MICRO DATA: MRSA PCR 07/01 >> NEG Urine 07/01 >> Klebsiella Resp 07/01 >> NOF Blood 07/01 >> 2/2 Klebsiella  ANTIMICROBIALS:  Vanc 07/01 >> 07/01 Pip-tazo 07/01 >> 07/02 Ceftriaxone 07/02 >>    SUBJECTIVE:  RASS -2, intubated - synchronous  VITAL SIGNS: BP (!) 95/59   Pulse 83   Temp (!) 95.9 F (35.5 C)   Resp 16   Ht 5\' 8"  (1.727 m)   Wt 229 lb 15 oz (104.3 kg)   SpO2 94%   BMI 34.96 kg/m   HEMODYNAMICS:    VENTILATOR SETTINGS: Vent Mode: PRVC FiO2 (%):  [40 %] 40 % Set Rate:  [16 bmp] 16 bmp Vt Set:  [500 mL] 500 mL PEEP:  [5 cmH20] 5 cmH20 Plateau Pressure:  [20 cmH20-22 cmH20] 21 cmH20  INTAKE / OUTPUT: I/O last 3 completed shifts: In: 4300.1 [I.V.:1712.6; NG/GT:2587.5] Out: 1166 [Urine:160;  Other:1006]  PHYSICAL EXAMINATION: General: Intubated, sedated, RASS -2 Neuro: PERRLA, EOMI, MAEs HEENT: NCAT, sclerae white. Mild scleral edema Cardiovascular: Regular, no murmurs Lungs: No wheezes Abdomen: Soft, diminished bowel sounds, no masses Ext: mottling of B hands and B feelt, no edema, diminished pedal pulses  LABS:  BMET  Recent Labs Lab 08/26/16 2134 08/27/16 0115 08/27/16 0505  NA 140 141 138  K 3.8 3.6 4.0  CL 103 107 103  CO2 27 26 27   BUN 37* 37* 40*  CREATININE 2.12* 2.07* 1.97*  GLUCOSE 234* 230* 276*    Electrolytes  Recent Labs Lab 08/26/16 2134 08/27/16 0115 08/27/16 0505 08/27/16 1009  CALCIUM 7.9* 7.2* 7.6*  --   MG 1.8 1.8 1.9 1.9  PHOS 3.3 3.3 3.2  --     CBC  Recent Labs Lab 08/25/16 0508 08/26/16 0532 08/27/16 0620  WBC 32.2* 39.4* 31.3*  HGB 11.3* 10.6* 9.9*  HCT 32.9* 32.0* 30.1*  PLT 32* 18* 18*    Coag's  Recent Labs Lab 08/24/16 2117 08/24/16 2118 08/25/16 0847 08/26/16 0532 08/27/16 0620  APTT 52*  --   --  40* 33  INR  --  2.17 2.20 1.44  --     Sepsis Markers  Recent Labs Lab 08/24/16 1544 08/24/16 2117 08/25/16 0515 08/25/16 0847 08/26/16 0916  LATICACIDVEN  --  14.0* 14.7* 15.2*  --   PROCALCITON >150.00  --   --   --  >150.00    ABG  Recent  Labs Lab 08/24/16 2213 08/25/16 0046 08/25/16 0508  PHART 7.14* 7.23* 7.35  PCO2ART 28* 31* 35  PO2ART 101 94 75*    Liver Enzymes  Recent Labs Lab 08/24/16 2115 08/25/16 0508  08/26/16 2134 08/27/16 0115 08/27/16 0505  AST 94* 139*  --   --   --  69*  ALT 43 48  --   --   --  55*  ALKPHOS 79 60  --   --   --  98  BILITOT 1.0 0.9  --   --   --  0.7  ALBUMIN 2.7* 2.3*  < > 2.3* 2.1* 2.2*  < > = values in this interval not displayed.  Cardiac Enzymes  Recent Labs Lab 08/24/16 1600 08/24/16 2116 08/25/16 0508  TROPONINI 0.13* 0.77* 1.25*    Glucose  Recent Labs Lab 08/27/16 0034 08/27/16 0416 08/27/16 0749 08/27/16 0821  08/27/16 0841 08/27/16 0843  GLUCAP 246* 255* <10* 20* <10* 303*    CXR:  R effusion, LLL consolidation vs atelectasis   ASSESSMENT / PLAN:  PULMONARY A: Acute hypoxemic respiratory failure ARDS/ALI Ventilator dependence P:   Cont vent support - settings reviewed and/or adjusted Cont vent bundle Daily SBT when meets criteria   CARDIOVASCULAR A:  Septic shock - vasopressor requirements cont to improve Mildly elevated troponin I P:  Continue NE to maintain MAP > 65 mmHg Recheck Trop I in AM 07/05 Will need cardiology evaluation after critical phase of this illness has resolved  RENAL A:   AKI, anuric Lactic acidosis, resolved P:   Monitor BMET intermittently Monitor I/Os Correct electrolytes as indicated Nephrology following. Continue CRRT Increase UF to 100 mL per hour 07/04  GASTROINTESTINAL A:   No issues P:   SUP: enteral PPI Continue TF protocol - initiated 07/02  HEMATOLOGIC A:   Severe DIC with thrombocytopenia and coagulopathy P:  DVT px: SCDs Monitor CBC intermittently Transfuse per usual guidelines Try to avoid platelets and FFP transfusions which might exacerbate DIC  INFECTIOUS A:   L  pyelonephritis/hydronephrosis Klebsiella bacteremia Severe sepsis Markedly elevated PCT P:   Monitor temp, WBC count Micro and abx as above Recheck PCT in AM 07/05  ENDOCRINE A:   DM 2, controlled Relative adrenal insufficiency P:   Cont Lantus/SSI - initiated 07/03 Decrease hydrocortisone to 50 mg q 12 hrs 07/04  NEUROLOGIC A:   Acute septic encephalopathy ICU/vent associated discomfort P:   RASS goal: -1, -2 Continue PAD protocol   FAMILY: Husband updated at bedside    CCM time: 40 mins The above time includes time spent in consultation with patient and/or family members and reviewing care plan on multidisciplinary rounds  Billy Fischeravid Trinady Milewski, MD PCCM service Mobile [phone] Pager [phone] 08/27/2016 10:52 AM

## 2016-08-27 NOTE — Progress Notes (Signed)
Pharmacy Consult for CRRT Medication Adjustment  No Known Allergies  Patient Measurements: Height: 5\' 8"  (172.7 cm) Weight: 229 lb 15 oz (104.3 kg) IBW/kg (Calculated) : 63.9  Vital Signs: Temp: 96.3 F (35.7 C) (07/04 0800) Temp Source: Core (Comment) (07/04 0400) BP: 90/55 (07/04 0800) Pulse Rate: 82 (07/04 0800) Intake/Output from previous day: 07/03 0701 - 07/04 0700 In: 2797.8 [I.V.:990.3; NG/GT:1807.5] Out: 1018 [Urine:60] Intake/Output from this shift: No intake/output data recorded.  Labs:  Recent Labs  08/24/16 2115 08/24/16 2117  08/25/16 0508  08/26/16 0532  08/26/16 2134 08/27/16 0115 08/27/16 0505 08/27/16 0620  WBC  --   --   < > 32.2*  --  39.4*  --   --   --   --  31.3*  HGB  --   --   < > 11.3*  --  10.6*  --   --   --   --  9.9*  HCT  --   --   < > 32.9*  --  32.0*  --   --   --   --  30.1*  PLT  --   --   < > 32*  --  18*  --   --   --   --  18*  APTT  --  52*  --   --   --  40*  --   --   --   --  33  CREATININE 2.62*  --   --  2.96*  < > 2.38*  2.40*  < > 2.12* 2.07* 1.97*  --   MG  --   --   < > 2.3  < > 1.7  < > 1.8 1.8 1.9  --   PHOS  --   --   < > 6.4*  < > 3.6  3.6  < > 3.3 3.3 3.2  --   ALBUMIN 2.7*  --   --  2.3*  < > 2.3*  < > 2.3* 2.1* 2.2*  --   PROT 5.8*  --   --  4.8*  --   --   --   --   --  5.5*  --   AST 94*  --   --  139*  --   --   --   --   --  69*  --   ALT 43  --   --  48  --   --   --   --   --  55*  --   ALKPHOS 79  --   --  60  --   --   --   --   --  98  --   BILITOT 1.0  --   --  0.9  --   --   --   --   --  0.7  --   < > = values in this interval not displayed. Estimated Creatinine Clearance: 34.6 mL/min (A) (by C-G formula based on SCr of 1.97 mg/dL (H)).  Medical History: Past Medical History:  Diagnosis Date  . Diabetes mellitus without complication (HCC)   . Hypertension   . Stroke [facility](HCC)     Assessment: 66 y/o F with a h/o DM, HTN, and CVA admitted with septic shock due to pyelonephritis. Patient is now  requiring CRRT.   Plan:  No medications require adjustment at present. Pharmacy will continue to monitor and adjust per consult.   Megan Nixon, Megan Nixon 08/27/2016,8:21 AM

## 2016-08-27 NOTE — Progress Notes (Signed)
[facility] - [place] [facility]   PATIENT NAME: Megan JacobsonSarah Nixon    MR#:  [id number]  DATE OF BIRTH:  Jun 21, 1950  SUBJECTIVE:   ON vent support Pressors. CRRT Anuric No family at bedside  REVIEW OF SYSTEMS:    Review of Systems  Unable to perform ROS: Intubated   DRUG ALLERGIES:  No Known Allergies  VITALS:  Blood pressure (!) 98/58, pulse 83, temperature (!) 96.3 F (35.7 C), resp. rate 16, height 5\' 8"  (1.727 m), weight 104.3 kg (229 lb 15 oz), SpO2 93 %.  PHYSICAL EXAMINATION:   Physical Exam  GENERAL:  66 y.o.-year-old patient lying in the bed. Looks critically ill, Limited exam. Pt intubated and on the vent EYES: Pupils equal, round, reactive to light HEENT: Head atraumatic, normocephalic. Oropharynx and nasopharynx clear. ET tube in place NECK:  Supple, no jugular venous distention. No thyroid enlargement, no tenderness.  LUNGS: Bilateral coarse breath sounds CARDIOVASCULAR: S1, S2 normal. Tachycardia ABDOMEN: Soft, nontender, nondistended. Bowel sounds present. No organomegaly or mass. Foley catheter in place EXTREMITIES: No cyanosis, clubbing or edema b/l.      LABORATORY PANEL:   CBC  Recent Labs Lab 08/27/16 0620  WBC 31.3*  HGB 9.9*  HCT 30.1*  PLT 18*   ------------------------------------------------------------------------------------------------------------------ Chemistries   Recent Labs Lab 08/27/16 0505 08/27/16 1009  NA 138 138  K 4.0 3.9  CL 103 103  CO2 27 26  GLUCOSE 276* 276*  BUN 40* 39*  CREATININE 1.97* 2.06*  CALCIUM 7.6* 8.0*  MG 1.9 1.9  AST 69*  --   ALT 55*  --   ALKPHOS 98  --   BILITOT 0.7  --    ------------------------------------------------------------------------------------------------------------------  Cardiac Enzymes  Recent Labs Lab 08/25/16 0508  TROPONINI 1.25*    ------------------------------------------------------------------------------------------------------------------  RADIOLOGY:  Dg Chest Port 1 View  Result Date: 08/27/2016 CLINICAL DATA:  Respiratory failure EXAM: PORTABLE CHEST 1 VIEW COMPARISON:  08/26/2016 FINDINGS: Support devices are stable. Cardiomegaly with vascular congestion. Elevation of the right hemidiaphragm. Small bilateral effusions and bibasilar atelectasis or infiltrates. IMPRESSION: Small bilateral effusions. Cardiomegaly with vascular congestion, bibasilar atelectasis or infiltrates. No real change. Electronically Signed   By: Charlett NoseKevin  Dover M.D.   On: 08/27/2016 07:19   Dg Chest Port 1 View  Result Date: 08/26/2016 CLINICAL DATA:  Respiratory failure. EXAM: PORTABLE CHEST 1 VIEW COMPARISON:  08/25/2016 . FINDINGS: Endotracheal tube has been slightly withdrawn, its tip is 1.9 cm above the carina. NG tube, right IJ line stable position. Cardiomegaly with bilateral pulmonary interstitial prominence suggesting interstitial edema. Small bilateral pleural effusions. No significant change from prior exam. No pneumothorax . IMPRESSION: 1. Endotracheal tube is been slightly withdrawn, its tip is 1.9 cm above the carina. NG tube and right IJ line stable position. 2. Cardiomegaly with bilateral interstitial prominence suggesting interstitial edema. Small bilateral pleural effusions. No significant change from prior exam. Electronically Signed   By: Maisie Fushomas  Register   On: 08/26/2016 06:54     ASSESSMENT AND PLAN:   * Septic shock * Klebsiella bacteremia * Left pyelonephritis with UPJ calculus and hydro-nephrosis * Acute kidney injury * ARDS * Acute hypoxic respiratory failure, ventilator dependent * DIC * Elevated troponin due to demand ischemia * Acute toxic and metabolic encephalopathy  Continue ICU care. Pressor support for map greater than 65.  On CRRT Continue full ventilatory support. Monitor blood work. Transfuse PRN IV  ceftriaxone  Patient is critically ill With high risk for cardiac arrest and death  All the records are reviewed and case discussed with Care Management/Social Workerr. Management plans discussed with the patient, family and they are in agreement.  CODE STATUS: FULL CODE  DVT Prophylaxis: SCDs  TOTAL TIME TAKING CARE OF THIS PATIENT: 25 minutes.    Emmanuelle Coxe M.D on 08/27/2016 at 12:01 PM  Between 7am to 6pm - Pager - [phone]  After 6pm go to [email or web address] - password [email or web address]  [facility]  Office  [phone]  CC: Primary care physician; Danella Penton, MD  Note: This dictation was prepared with Dragon dictation along with smaller phrase technology. Any transcriptional errors that result from this process are unintentional.

## 2016-08-27 NOTE — Progress Notes (Signed)
[facility]  ROUNDING NOTE   Subjective:   Husband at bedside.  CRRT - tolerating treatment well. UOP 60. UF of 958 net +1779  Off vasopressin. Low dose norepinephrine 20mg  Platelets 18  Objective:  Vital signs in last 24 hours:  Temp:  [95.7 F (35.4 C)-98.4 F (36.9 C)] 96.3 F (35.7 C) (07/04 0800) Pulse Rate:  [73-87] 82 (07/04 0800) Resp:  [15-18] 16 (07/04 0800) BP: (82-104)/(54-76) 90/55 (07/04 0800) SpO2:  [94 %-100 %] 94 % (07/04 0800) Arterial Line BP: (85-108)/(51-62) 94/51 (07/04 0800) FiO2 (%):  [40 %] 40 % (07/04 0821) Weight:  [104.3 kg (229 lb 15 oz)] 104.3 kg (229 lb 15 oz) (07/04 0700)  Weight change:  Filed Weights   08/24/16 0725 08/24/16 1547 08/27/16 0700  Weight: 85.8 kg (189 lb 3.2 oz) 89.6 kg (197 lb 8.5 oz) 104.3 kg (229 lb 15 oz)    Intake/Output: I/O last 3 completed shifts: In: 4300.1 [I.V.:1712.6; NG/GT:2587.5] Out: 1118 [Urine:160; Other:958]   Intake/Output this shift:  No intake/output data recorded.  Physical Exam: General: Critically ill   Head: ETT  Eyes: Anicteric, PERRL  Neck:  trachea midline  Lungs:  PRVC FiO2 40%  Heart: tachycardia  Abdomen:  Soft, nontender, obese  Extremities: + peripheral edema. +cyanosis  Neurologic: Intubated, sedated  Skin: No lesions  Access: Left femoral temp HD catheter 7/2 Dr. SAlva Garnet   Basic Metabolic Panel:  Recent Labs Lab 08/26/16 0916 08/26/16 1232 08/26/16 1614 08/26/16 2134 08/27/16 0115 08/27/16 0505  NA 139 139  --  140 141 138  K 3.6 4.0  --  3.8 3.6 4.0  CL 103 101  --  103 107 103  CO2 26 26  --  _0 GLUCOSE 182* 118*  --  234* 230* 276*  BUN 33* 33*  --  37* 37* 40*  CREATININE 2.36* 2.13*  --  2.12* 2.07* 1.97*  CALCIUM 7.1* 7.4*  --  7.9* 7.2* 7.6*  MG 1.8  --  1.9 1.8 1.8 1.9  PHOS 3.3 3.4  --  3.3 3.3 3.2    Liver Function Tests:  Recent Labs Lab 08/24/16 0919 08/24/16 2115 08/25/16 0508  08/26/16 0916 08/26/16 1232  08/26/16 2134 08/27/16 0115 08/27/16 0505  AST 83* 94* 139*  --   --   --   --   --  69*  ALT 44 43 48  --   --   --   --   --  55*  ALKPHOS 78 79 60  --   --   --   --   --  98  BILITOT 0.9 1.0 0.9  --   --   --   --   --  0.7  PROT 6.7 5.8* 4.8*  --   --   --   --   --  5.5*  ALBUMIN 3.2* 2.7* 2.3*  < > 2.2* 2.4* 2.3* 2.1* 2.2*  < > = values in this interval not displayed. No results for input(s): LIPASE, AMYLASE in the last 168 hours. No results for input(s): AMMONIA in the last 168 hours.  CBC:  Recent Labs Lab 08/24/16 1600 08/24/16 2118 08/25/16 0508 08/26/16 0532 08/27/16 0620  WBC 23.4* 34.1* 32.2* 39.4* 31.3*  NEUTROABS 21.3*  --  27.1* 35.0* 28.2*  HGB 11.1* 11.9* 11.3* 10.6* 9.9*  HCT 34.7* 36.1 32.9* 32.0* 30.1*  MCV 91.9 91.3 88.6 87.3 87.2  PLT 69* 54* 32* 18* 18*  Cardiac Enzymes:  Recent Labs Lab 08/24/16 0919 08/24/16 1544 08/24/16 1600 08/24/16 2116 08/25/16 0508  TROPONINI 0.04* 0.13* 0.13* 0.77* 1.25*    BNP: Invalid input(s): POCBNP  CBG:  Recent Labs Lab 08/27/16 0416 08/27/16 0749 08/27/16 0821 08/27/16 0841 08/27/16 0843  GLUCAP 255* <10* 20* <10* 303*    Microbiology: Results for orders placed or performed during the hospital encounter of 08/24/16  Blood Culture (routine x 2)     Status: Abnormal   Collection Time: 08/24/16  8:25 AM  Result Value Ref Range Status   Specimen Description BLOOD RIGHT ANTECUBITAL  Final   Special Requests   Final    BOTTLES DRAWN AEROBIC AND ANAEROBIC Blood Culture adequate volume   Culture  Setup Time   Final    GRAM NEGATIVE RODS IN BOTH AEROBIC AND ANAEROBIC BOTTLES CRITICAL RESULT CALLED TO, READ BACK BY AND VERIFIED WITH: JASON ROBBINS ON 08/24/16 AT 1933 QSD    Culture (A)  Final    KLEBSIELLA PNEUMONIAE SUSCEPTIBILITIES PERFORMED ON PREVIOUS CULTURE WITHIN THE LAST 5 DAYS. Performed at [facility], [place] [address], [place], [place] [address]    Report Status 08/27/2016 FINAL   Final  Blood Culture (routine x 2)     Status: Abnormal   Collection Time: 08/24/16  8:25 AM  Result Value Ref Range Status   Specimen Description BLOOD RIGHT ANTECUBITAL  Final   Special Requests   Final    BOTTLES DRAWN AEROBIC AND ANAEROBIC Blood Culture adequate volume   Culture  Setup Time   Final    GRAM NEGATIVE RODS IN BOTH AEROBIC AND ANAEROBIC BOTTLES CRITICAL RESULT CALLED TO, READ BACK BY AND VERIFIED WITH: JASON ROBBINS ON 08/24/16 AT 1933 QSD    Culture KLEBSIELLA PNEUMONIAE (A)  Final   Report Status 08/27/2016 FINAL  Final   Organism ID, Bacteria KLEBSIELLA PNEUMONIAE  Final      Susceptibility   Klebsiella pneumoniae - MIC*    AMPICILLIN >=32 RESISTANT Resistant     CEFAZOLIN <=4 SENSITIVE Sensitive     CEFEPIME <=1 SENSITIVE Sensitive     CEFTAZIDIME <=1 SENSITIVE Sensitive     CEFTRIAXONE <=1 SENSITIVE Sensitive     CIPROFLOXACIN <=0.25 SENSITIVE Sensitive     GENTAMICIN <=1 SENSITIVE Sensitive     IMIPENEM <=0.25 SENSITIVE Sensitive     TRIMETH/SULFA <=20 SENSITIVE Sensitive     AMPICILLIN/SULBACTAM 8 SENSITIVE Sensitive     PIP/TAZO <=4 SENSITIVE Sensitive     Extended ESBL NEGATIVE Sensitive     * KLEBSIELLA PNEUMONIAE  Blood Culture ID Panel (Reflexed)     Status: Abnormal   Collection Time: 08/24/16  8:25 AM  Result Value Ref Range Status   Enterococcus species NOT DETECTED NOT DETECTED Final   Listeria monocytogenes NOT DETECTED NOT DETECTED Final   Staphylococcus species NOT DETECTED NOT DETECTED Final   Staphylococcus aureus NOT DETECTED NOT DETECTED Final   Streptococcus species NOT DETECTED NOT DETECTED Final   Streptococcus agalactiae NOT DETECTED NOT DETECTED Final   Streptococcus pneumoniae NOT DETECTED NOT DETECTED Final   Streptococcus pyogenes NOT DETECTED NOT DETECTED Final   Acinetobacter baumannii NOT DETECTED NOT DETECTED Final   Enterobacteriaceae species DETECTED (A) NOT DETECTED Final    Comment: Enterobacteriaceae represent a large  family of gram-negative bacteria, not a single organism. CRITICAL RESULT CALLED TO, READ BACK BY AND VERIFIED WITH: JASON ROBBINS ON 08/24/16 AT 1933 QSD    Enterobacter cloacae complex NOT DETECTED NOT DETECTED Final  Escherichia coli NOT DETECTED NOT DETECTED Final   Klebsiella oxytoca NOT DETECTED NOT DETECTED Final   Klebsiella pneumoniae DETECTED (A) NOT DETECTED Final    Comment: CRITICAL RESULT CALLED TO, READ BACK BY AND VERIFIED WITH: JASON ROBBINS ON 08/24/16 AT 1933 QSD    Proteus species NOT DETECTED NOT DETECTED Final   Serratia marcescens NOT DETECTED NOT DETECTED Final   Carbapenem resistance NOT DETECTED NOT DETECTED Final   Haemophilus influenzae NOT DETECTED NOT DETECTED Final   Neisseria meningitidis NOT DETECTED NOT DETECTED Final   Pseudomonas aeruginosa NOT DETECTED NOT DETECTED Final   Candida albicans NOT DETECTED NOT DETECTED Final   Candida glabrata NOT DETECTED NOT DETECTED Final   Candida krusei NOT DETECTED NOT DETECTED Final   Candida parapsilosis NOT DETECTED NOT DETECTED Final   Candida tropicalis NOT DETECTED NOT DETECTED Final  Urine culture     Status: Abnormal   Collection Time: 08/24/16  9:19 AM  Result Value Ref Range Status   Specimen Description URINE, CATHETERIZED  Final   Special Requests NONE  Final   Culture >=100,000 COLONIES/mL KLEBSIELLA PNEUMONIAE (A)  Final   Report Status 08/26/2016 FINAL  Final   Organism ID, Bacteria KLEBSIELLA PNEUMONIAE (A)  Final      Susceptibility   Klebsiella pneumoniae - MIC*    AMPICILLIN >=32 RESISTANT Resistant     CEFAZOLIN <=4 SENSITIVE Sensitive     CEFTRIAXONE <=1 SENSITIVE Sensitive     CIPROFLOXACIN <=0.25 SENSITIVE Sensitive     GENTAMICIN <=1 SENSITIVE Sensitive     IMIPENEM <=0.25 SENSITIVE Sensitive     NITROFURANTOIN 64 INTERMEDIATE Intermediate     TRIMETH/SULFA <=20 SENSITIVE Sensitive     AMPICILLIN/SULBACTAM 4 SENSITIVE Sensitive     PIP/TAZO <=4 SENSITIVE Sensitive     Extended ESBL  NEGATIVE Sensitive     * >=100,000 COLONIES/mL KLEBSIELLA PNEUMONIAE  Body fluid culture     Status: None (Preliminary result)   Collection Time: 08/24/16  3:30 PM  Result Value Ref Range Status   Specimen Description FLUID  Final   Special Requests LEFT NEPHROSTOMY  Final   Gram Stain   Final    WBC PRESENT, PREDOMINANTLY PMN GRAM NEGATIVE RODS CYTOSPIN SMEAR    Culture   Final    FEW KLEBSIELLA PNEUMONIAE CULTURE REINCUBATED FOR BETTER GROWTH SUSCEPTIBILITIES TO FOLLOW Performed at [facility], [address] [place], [place] [address]    Report Status PENDING  Incomplete  MRSA PCR Screening     Status: None   Collection Time: 08/24/16  3:44 PM  Result Value Ref Range Status   MRSA by PCR NEGATIVE NEGATIVE Final    Comment:        The GeneXpert MRSA Assay (FDA approved for NASAL specimens only), is one component of a comprehensive MRSA colonization surveillance program. It is not intended to diagnose MRSA infection nor to guide or monitor treatment for MRSA infections.   Culture, respiratory (NON-Expectorated)     Status: None (Preliminary result)   Collection Time: 08/25/16  7:00 AM  Result Value Ref Range Status   Specimen Description TRACHEAL ASPIRATE  Final   Special Requests NONE  Final   Gram Stain   Final    FEW WBC PRESENT, PREDOMINANTLY PMN FEW SQUAMOUS EPITHELIAL CELLS PRESENT NO ORGANISMS SEEN    Culture   Final    CULTURE REINCUBATED FOR BETTER GROWTH Performed at [facility], [place] [address], [place], [place] [address]    Report  Status PENDING  Incomplete  C difficile quick scan w PCR reflex     Status: None   Collection Time: 08/26/16  8:05 PM  Result Value Ref Range Status   C Diff antigen NEGATIVE NEGATIVE Final   C Diff toxin NEGATIVE NEGATIVE Final   C Diff interpretation No C. difficile detected.  Final    Coagulation Studies:  Recent Labs  08/24/16 1317 08/24/16 1544 08/24/16 2118 08/25/16 0847 08/26/16 0532   LABPROT 23.4* 25.0* 24.5* 24.8* 17.7*  INR 2.05 2.22 2.17 2.20 1.44    Urinalysis:  Recent Labs  08/24/16 1004  COLORURINE AMBER*  LABSPEC 1.019  PHURINE 5.0  GLUCOSEU NEGATIVE  HGBUR SMALL*  BILIRUBINUR NEGATIVE  KETONESUR 5*  PROTEINUR 100*  NITRITE POSITIVE*  LEUKOCYTESUR NEGATIVE      Imaging: Dg Chest Port 1 View  Result Date: 08/27/2016 CLINICAL DATA:  Respiratory failure EXAM: PORTABLE CHEST 1 VIEW COMPARISON:  08/26/2016 FINDINGS: Support devices are stable. Cardiomegaly with vascular congestion. Elevation of the right hemidiaphragm. Small bilateral effusions and bibasilar atelectasis or infiltrates. IMPRESSION: Small bilateral effusions. Cardiomegaly with vascular congestion, bibasilar atelectasis or infiltrates. No real change. Electronically Signed   By: Rolm Baptise M.D.   On: 08/27/2016 07:19   Dg Chest Port 1 View  Result Date: 08/26/2016 CLINICAL DATA:  Respiratory failure. EXAM: PORTABLE CHEST 1 VIEW COMPARISON:  08/25/2016 . FINDINGS: Endotracheal tube has been slightly withdrawn, its tip is 1.9 cm above the carina. NG tube, right IJ line stable position. Cardiomegaly with bilateral pulmonary interstitial prominence suggesting interstitial edema. Small bilateral pleural effusions. No significant change from prior exam. No pneumothorax . IMPRESSION: 1. Endotracheal tube is been slightly withdrawn, its tip is 1.9 cm above the carina. NG tube and right IJ line stable position. 2. Cardiomegaly with bilateral interstitial prominence suggesting interstitial edema. Small bilateral pleural effusions. No significant change from prior exam. Electronically Signed   By: Marcello Moores  Register   On: 08/26/2016 06:54     Medications:   . cefTRIAXone (ROCEPHIN)  IV Stopped (08/26/16 1302)  . famotidine (PEPCID) IV Stopped (08/26/16 1652)  . feeding supplement (VITAL HIGH PROTEIN) 1,000 mL (08/27/16 0700)  . fentaNYL infusion INTRAVENOUS 100 mcg/hr (08/27/16 0700)  . norepinephrine  (LEVOPHED) Adult infusion 3 mcg/min (08/27/16 0800)  . pureflow 2,000 mL/hr at 08/26/16 1045  . vasopressin (PITRESSIN) infusion - *FOR SHOCK* Stopped (08/27/16 0600)   . chlorhexidine gluconate (MEDLINE KIT)  15 mL Mouth Rinse BID  . dextrose  1 ampule Intravenous Once  . free water  50 mL Per Tube Q6H  . hydrocortisone sod succinate (SOLU-CORTEF) inj  50 mg Intravenous Q6H  . insulin aspart  0-20 Units Subcutaneous Q4H  . insulin glargine  10 Units Subcutaneous BID  . mouth rinse  15 mL Mouth Rinse 10 times per day  . sodium chloride flush  10-40 mL Intracatheter Q12H   acetaminophen **OR** [DISCONTINUED] acetaminophen, albuterol, bisacodyl, fentaNYL (SUBLIMAZE) injection, midazolam, ondansetron **OR** ondansetron (ZOFRAN) IV, senna-docusate, sodium chloride flush  Assessment/ Plan:  Ms. CLARINE ELROD is a 66 y.o. black female Ms. JELISHA WEED is a 66 y.o. black female with diabetes mellitus type II, hypertension, history of CVA, who was admitted to [facility] on 08/24/2016 for septic shock  1. Acute renal failure with metabolic acidosis: baseline creatinine of 0.7 on 06/12/2016.  Oliguric urine output. Requiring vasopressors.  Acute renal failure secondary obstructive uropathy, sepsis, hypotension and ATN.  - Continue CVVHD. Increase UF to 14m/hr - labs q6  2. Septic Shock: Klebsiella.  Afebrile. Leukocytosis 31.3 (39.4).  Klebsiella - vasopressors: norepinephrine. Off vasopressin and phenylephrine - hydrocortisone - ceftriaxone  3. Respiratory Failure requiring mechanical ventilation:  - Continue supportive care  4. Anemia and thrombocytopenia with renal failure: concerning for DIC - discontinuation of famotadine   LOS: [place], [place] 7/4/20189:02 AM

## 2016-08-28 ENCOUNTER — Inpatient Hospital Stay: Payer: Medicare PPO

## 2016-08-28 DIAGNOSIS — R6520 Severe sepsis without septic shock: Secondary | ICD-10-CM

## 2016-08-28 DIAGNOSIS — J9601 Acute respiratory failure with hypoxia: Secondary | ICD-10-CM

## 2016-08-28 LAB — PHOSPHORUS: PHOSPHORUS: 2.5 mg/dL (ref 2.5–4.6)

## 2016-08-28 LAB — GLUCOSE, CAPILLARY
GLUCOSE-CAPILLARY: 126 mg/dL — AB (ref 65–99)
GLUCOSE-CAPILLARY: 186 mg/dL — AB (ref 65–99)
GLUCOSE-CAPILLARY: 25 mg/dL — AB (ref 65–99)
GLUCOSE-CAPILLARY: 82 mg/dL (ref 65–99)
Glucose-Capillary: <10 mg/dL — CL (ref 65–99)
Glucose-Capillary: 167 mg/dL — ABNORMAL HIGH (ref 65–99)
Glucose-Capillary: 171 mg/dL — ABNORMAL HIGH (ref 65–99)
Glucose-Capillary: 185 mg/dL — ABNORMAL HIGH (ref 65–99)
Glucose-Capillary: 219 mg/dL — ABNORMAL HIGH (ref 65–99)
Glucose-Capillary: 218 mg/dL — ABNORMAL HIGH (ref 65–99)
Glucose-Capillary: 190 mg/dL — ABNORMAL HIGH (ref 65–99)

## 2016-08-28 LAB — MAGNESIUM
MAGNESIUM: 1.8 mg/dL (ref 1.7–2.4)
MAGNESIUM: 1.8 mg/dL (ref 1.7–2.4)
Magnesium: 1.8 mg/dL (ref 1.7–2.4)

## 2016-08-28 LAB — BODY FLUID CULTURE

## 2016-08-28 LAB — COMPREHENSIVE METABOLIC PANEL
ALBUMIN: 2 g/dL — AB (ref 3.5–5.0)
ALT: 57 U/L — ABNORMAL HIGH (ref 14–54)
ANION GAP: 6 (ref 5–15)
AST: 111 U/L — ABNORMAL HIGH (ref 15–41)
Alkaline Phosphatase: 130 U/L — ABNORMAL HIGH (ref 38–126)
BUN: 52 mg/dL — ABNORMAL HIGH (ref 6–20)
CO2: 27 mmol/L (ref 22–32)
Calcium: 7.6 mg/dL — ABNORMAL LOW (ref 8.9–10.3)
Chloride: 107 mmol/L (ref 101–111)
Creatinine, Ser: 2.21 mg/dL — ABNORMAL HIGH (ref 0.44–1.00)
GFR calc Af Amer: 25 mL/min — ABNORMAL LOW (ref >60)
GFR calc non Af Amer: 22 mL/min — ABNORMAL LOW (ref >60)
GLUCOSE: 213 mg/dL — AB (ref 65–99)
POTASSIUM: 3.8 mmol/L (ref 3.5–5.1)
SODIUM: 140 mmol/L (ref 135–145)
Total Bilirubin: 0.6 mg/dL (ref 0.3–1.2)
Total Protein: 5.3 g/dL — ABNORMAL LOW (ref 6.5–8.1)

## 2016-08-28 LAB — RENAL FUNCTION PANEL
ALBUMIN: 2.1 g/dL — AB (ref 3.5–5.0)
Albumin: 2 g/dL — ABNORMAL LOW (ref 3.5–5.0)
Albumin: 2 g/dL — ABNORMAL LOW (ref 3.5–5.0)
Anion gap: 9 (ref 5–15)
Anion gap: 7 (ref 5–15)
Anion gap: 5 (ref 5–15)
BUN: 50 mg/dL — ABNORMAL HIGH (ref 6–20)
BUN: 49 mg/dL — AB (ref 6–20)
BUN: 54 mg/dL — ABNORMAL HIGH (ref 6–20)
CALCIUM: 7.7 mg/dL — AB (ref 8.9–10.3)
CHLORIDE: 104 mmol/L (ref 101–111)
CO2: 24 mmol/L (ref 22–32)
CO2: 27 mmol/L (ref 22–32)
CO2: 28 mmol/L (ref 22–32)
CREATININE: 2.1 mg/dL — AB (ref 0.44–1.00)
CREATININE: 2.1 mg/dL — AB (ref 0.44–1.00)
Calcium: 8 mg/dL — ABNORMAL LOW (ref 8.9–10.3)
Calcium: 8.1 mg/dL — ABNORMAL LOW (ref 8.9–10.3)
Chloride: 105 mmol/L (ref 101–111)
Chloride: 103 mmol/L (ref 101–111)
Creatinine, Ser: 2.07 mg/dL — ABNORMAL HIGH (ref 0.44–1.00)
GFR calc Af Amer: 27 mL/min — ABNORMAL LOW (ref >60)
GFR calc non Af Amer: 23 mL/min — ABNORMAL LOW (ref >60)
GFR calc non Af Amer: 23 mL/min — ABNORMAL LOW (ref >60)
GFR, EST AFRICAN AMERICAN: 27 mL/min — AB (ref >60)
GFR, EST AFRICAN AMERICAN: 27 mL/min — AB (ref >60)
GFR, EST NON AFRICAN AMERICAN: 23 mL/min — AB (ref >60)
GLUCOSE: 229 mg/dL — AB (ref 65–99)
Glucose, Bld: 210 mg/dL — ABNORMAL HIGH (ref 65–99)
Glucose, Bld: 187 mg/dL — ABNORMAL HIGH (ref 65–99)
PHOSPHORUS: 2.7 mg/dL (ref 2.5–4.6)
POTASSIUM: 4.2 mmol/L (ref 3.5–5.1)
POTASSIUM: 4.1 mmol/L (ref 3.5–5.1)
Phosphorus: 2.6 mg/dL (ref 2.5–4.6)
Phosphorus: 2.8 mg/dL (ref 2.5–4.6)
Potassium: 4 mmol/L (ref 3.5–5.1)
SODIUM: 138 mmol/L (ref 135–145)
SODIUM: 137 mmol/L (ref 135–145)
Sodium: 137 mmol/L (ref 135–145)

## 2016-08-28 LAB — CBC WITH DIFFERENTIAL/PLATELET
BASOS ABS: 0.3 10*3/uL — AB (ref 0–0.1)
Basophils Relative: 1 %
EOS ABS: 0.3 10*3/uL (ref 0–0.7)
Eosinophils Relative: 1 %
HCT: 29.8 % — ABNORMAL LOW (ref 35.0–47.0)
HEMOGLOBIN: 9.8 g/dL — AB (ref 12.0–16.0)
LYMPHS ABS: 2.7 10*3/uL (ref 1.0–3.6)
LYMPHS PCT: 8 %
MCH: 29 pg (ref 26.0–34.0)
MCHC: 32.8 g/dL (ref 32.0–36.0)
MCV: 88.6 fL (ref 80.0–100.0)
Monocytes Absolute: 0 10*3/uL — ABNORMAL LOW (ref 0.2–0.9)
Monocytes Relative: 0 %
NEUTROS ABS: 30.7 10*3/uL — AB (ref 1.4–6.5)
Neutrophils Relative %: 90 %
Platelets: 17 10*3/uL — CL (ref 150–440)
RBC: 3.36 MIL/uL — ABNORMAL LOW (ref 3.80–5.20)
RDW: 17.2 % — AB (ref 11.5–14.5)
WBC: 34 10*3/uL — ABNORMAL HIGH (ref 3.6–11.0)

## 2016-08-28 LAB — PROCALCITONIN: Procalcitonin: 138.48 ng/mL

## 2016-08-28 LAB — APTT: aPTT: 33 seconds (ref 24–36)

## 2016-08-28 LAB — FIBRIN DERIVATIVES D-DIMER (ARMC ONLY)

## 2016-08-28 LAB — TROPONIN I: Troponin I: 0.72 ng/mL (ref <0.03)

## 2016-08-28 LAB — FIBRINOGEN: FIBRINOGEN: 348 mg/dL (ref 210–475)

## 2016-08-28 LAB — PROTIME-INR
INR: 1.24
PROTHROMBIN TIME: 15.7 s — AB (ref 11.4–15.2)

## 2016-08-28 MED ORDER — VITAL HIGH PROTEIN PO LIQD
1000.0000 mL | ORAL | Status: DC
  Administered 2016-08-28 (×3): 1000 mL
  Administered 2016-08-29 (×2)

## 2016-08-28 MED ORDER — HYDROCORTISONE NA SUCCINATE PF 100 MG IJ SOLR
25.0000 mg | Freq: Three times a day (TID) | INTRAMUSCULAR | Status: DC
  Administered 2016-08-28 – 2016-08-31 (×9): 25 mg via INTRAVENOUS
  Filled 2016-08-28 (×9): qty 2

## 2016-08-28 MED ORDER — STERILE WATER FOR INJECTION IJ SOLN
INTRAMUSCULAR | Status: AC
  Administered 2016-08-28: 03:00:00
  Filled 2016-08-28: qty 10

## 2016-08-28 MED ORDER — SODIUM CHLORIDE 0.9 % IV SOLN
1.0000 g | Freq: Two times a day (BID) | INTRAVENOUS | Status: DC
  Administered 2016-08-28 – 2016-09-01 (×9): 1 g via INTRAVENOUS
  Filled 2016-08-28 (×10): qty 1

## 2016-08-28 MED ORDER — DEXTROSE 5 % IV SOLN
2.0000 g | Freq: Two times a day (BID) | INTRAVENOUS | Status: DC
  Filled 2016-08-28 (×2): qty 2

## 2016-08-28 MED ORDER — ALTEPLASE 2 MG IJ SOLR
2.0000 mg | Freq: Once | INTRAMUSCULAR | Status: AC
  Administered 2016-08-28: 2 mg
  Filled 2016-08-28: qty 2

## 2016-08-28 MED ORDER — HEPARIN SODIUM (PORCINE) 1000 UNIT/ML DIALYSIS
1000.0000 [IU] | INTRAMUSCULAR | Status: DC | PRN
  Administered 2016-08-28: 3400 [IU] via INTRAVENOUS_CENTRAL
  Filled 2016-08-28 (×2): qty 6

## 2016-08-28 MED ORDER — PRO-STAT SUGAR FREE PO LIQD
30.0000 mL | Freq: Three times a day (TID) | ORAL | Status: DC
  Administered 2016-08-28 (×2): 30 mL

## 2016-08-28 NOTE — Progress Notes (Signed)
Cath flo removed from arterial and venous ports of trialysis cath.  Arterial port has great blood return, venous still has nothing.  Will inform NP.

## 2016-08-28 NOTE — Progress Notes (Signed)
CRRT stopped. Venous line clotted.  Cath flo instilled and cartridge replaced.

## 2016-08-28 NOTE — Progress Notes (Signed)
Pharmacy Antibiotic Note  Megan FerdinandSarah S Nixon is a 66 y.o. female admitted on 08/24/2016 with septic shock due to pyelonephritis.  Patient was previously on ceftriaxone for Klebsiella pneumoniae UTI and bacteremia. Pharmacy has been consulted for meropenem dosing. Patient is currently receiving CRRT.   Plan: Will begin meropenem 1 g iv q 12 hours.   Height: 5\' 8"  (172.7 cm) Weight: 221 lb 1.9 oz (100.3 kg) IBW/kg (Calculated) : 63.9  Temp (24hrs), Avg:97.2 F (36.2 C), Min:95.4 F (35.2 C), Max:99.3 F (37.4 C)   Recent Labs Lab 08/24/16 0824  08/24/16 1201 08/24/16 1600  08/24/16 2117 08/24/16 2118 08/25/16 0508 08/25/16 0515 08/25/16 0847  08/26/16 0532  08/27/16 0505 08/27/16 0620 08/27/16 1009 08/27/16 1623 08/27/16 2152 08/28/16 0420  WBC 14.8*  --   --  23.4*  --   --  34.1* 32.2*  --   --   --  39.4*  --   --  31.3*  --   --   --  34.0*  CREATININE  --   < >  --  2.57*  < >  --   --  2.96*  --   --   < > 2.38*  2.40*  < > 1.97*  --  2.06* 1.82* 1.96* 2.21*  LATICACIDVEN 10.6*  --  8.5*  --   --  14.0*  --   --  14.7* 15.2*  --   --   --   --   --   --   --   --   --   VANCORANDOM  --   --   --  15  --   --   --   --   --   --   --   --   --   --   --   --   --   --   --   < > = values in this interval not displayed.  Estimated Creatinine Clearance: 30.2 mL/min (A) (by C-G formula based on SCr of 2.21 mg/dL (H)).    No Known Allergies  Antimicrobials this admission: Vancomycin 7/1 x 1 Azithromycin 7/1 x 1 Ceftriaxone 7/1 x 1, 7/3 >> 7/4 Zosyn 7/1 >> 7/3 meropenem 7/5 >>   Dose adjustments this admission:   Microbiology results: MRSA PCR: negative BCx: Klebsiella pneumoniae UCx: Klebsiella pneumoniae Nephrostomy tube fluid: Klebsiella pneumoniae TA: normal flora C diff: negative  Thank you for allowing pharmacy to be a part of this patient's care.  Megan HartChristy, Megan Nixon D 08/28/2016 11:58 AM

## 2016-08-28 NOTE — Progress Notes (Signed)
Nutrition Follow-up  DOCUMENTATION CODES:   Obesity unspecified  INTERVENTION:  1. Decrease VHP to 7940mL/hr; Add Pro-Stat 30mL TID via OGT Provides 1260 calories, 129gm protein, 803cc free water  NUTRITION DIAGNOSIS:   Inadequate oral intake related to inability to eat (patient ventilated ) as evidenced by NPO status. -ongoing  GOAL:   Provide needs based on ASPEN/SCCM guidelines -meeting  MONITOR:   Diet advancement, Vent status, Labs, Weight trends, TF tolerance  REASON FOR ASSESSMENT:   Consult Enteral/tube feeding initiation and management  ASSESSMENT:   66 y.o. black female with diabetes mellitus type II, hypertension, history of CVA, who was [facility] for hydronephrosis, acute renal failure with metabolic acidosis and septic shock secondary to left kidney stone. Pt intubated r/t acute respiratory failure and now s/p nephrostomy tube placement 7/1.   Patient is currently intubated on ventilator support MV: 6.6 L/min Temp (24hrs), Avg:97.5 F (36.4 C), Min:96.1 F (35.6 C), Max:99.3 F (37.4 C) Propofol: None  Needs adjusted for CRRT in obese patient On CRRT - L Pyelonephritis/hydronephrosis  Intake/Output Summary (Last 24 hours) at 08/28/16 1502 Last data filed at 08/28/16 1400  Gross per 24 hour  Intake          2329.35 ml  Output             2520 ml  Net          -190.65 ml   Wt up 14kgs since admit Off pressors, DIC continues Relative adrenal insufficiency Labs and medications reviewed: BUN/Creatinine 52/2.21, LFTs elevated Fentanyl gtt, Protonix  Diet Order:     Skin:  Reviewed, no issues  Last BM:  none since admit   Height:   Ht Readings from Last 1 Encounters:  08/24/16 5\' 8"  (1.727 m)    Weight:   Wt Readings from Last 1 Encounters:  08/28/16 221 lb 1.9 oz (100.3 kg)    Ideal Body Weight:  63.6 kg  BMI:  Body mass index is 33.62 kg/m.  Estimated Nutritional Needs:   Kcal:  [phone] calories  Protein:   127-159 grams  Fluid:  Per MD/Nephro  EDUCATION NEEDS:   Education needs no appropriate at this time  Dionne AnoWilliam M. Bora Broner, MS, RD LDN Inpatient Clinical Dietitian Pager [phone]

## 2016-08-28 NOTE — Progress Notes (Signed)
NP placing new dialysis cath.

## 2016-08-28 NOTE — Progress Notes (Signed)
Approximately 2200 patient developed large clot in venous line of CRRT machine. Cartridge was replaced and patient's CRRT was restarted approximately 2245. Will contuine to monitor and assess the patient.

## 2016-08-28 NOTE — Progress Notes (Signed)
PULMONARY / CRITICAL CARE MEDICINE   Name: Megan PETERKIN MRN: [id number] DOB: [birth year]    ADMISSION DATE:  08/24/2016  PT PROFILE:   66 y.o. F with hx of DM, hypertension admitted with severe sepsis/septic shock due to pyelonephritis, left hydronephrosis, klebsiella bacteremia  MAJOR EVENTS/TEST RESULTS: 07/01 admitted to ICU via emergency department as above 07/01 CT renal stone study: 5.5 x 5.0 mm left UPJ calculus with high-grade obstructive findings 07/01 CTA chest: No PE, patchy bilateral infiltrates 07/01 urology consultation Griffin Dakin): PCN placement recommended. Recommended 14 day course of antibiotics for complicated UTI. "Can plan definitive stone treatment once infection has been treated (between 2 and 6 weeks post PCN placement in most cases)"  07/01 left PCN tube placed by IR 07/02 echocardiogram: LVEF 55-60% 07/02 nephrology consultation - CRRT ordered 07/03 decreasing vasopressor requirements. Remains on CRRT. Severe DIC persists 07/04 Off vasopressin. Low dose norepinephrine. Awakens but not F/C. DIC persists 07/05 off of all vasopressors. More awake and responsive. Follows commands intermittently. 07/05 renal ultrasound (to rule out perinephric abscess):   INDWELLING DEVICES:: ETT 07/01 >>  R IJ CVL 07/01 >>  R radial art line 07/01 >> 07/05 L femoral HD cath 07/02 >>   MICRO DATA: MRSA PCR 07/01 >> NEG Urine 07/01 >> Klebsiella Resp 07/01 >> NOF Blood 07/01 >> 2/2 Klebsiella  ANTIMICROBIALS:  Vanc 07/01 >> 07/01 Pip-tazo 07/01 >> 07/02 Ceftriaxone 07/02 >> 07/05 Meropenem 07/05 >>    SUBJECTIVE:  RASS 0, intubated - synchronous, follows commands intermittently  VITAL SIGNS: BP 107/64 (BP Location: Left Arm)   Pulse 90   Temp (!) 96.8 F (36 C) (Oral)   Resp 13   Ht 5\' 8"  (1.727 m)   Wt 221 lb 1.9 oz (100.3 kg)   SpO2 98%   BMI 33.62 kg/m   HEMODYNAMICS:    VENTILATOR SETTINGS: Vent Mode: PRVC FiO2 (%):  [40 %] 40 % Set Rate:  [2  bmp-16 bmp] 2 bmp Vt Set:  [500 mL] 500 mL PEEP:  [5 cmH20] 5 cmH20 Plateau Pressure:  [19 cmH20] 19 cmH20  INTAKE / OUTPUT: I/O last 3 completed shifts: In: 4995.2 [I.V.:1262.7; WU/JW:1191.4; IV Piggyback:100] Out: 2932 [Urine:125; NWGNF:6213; Stool:400]  PHYSICAL EXAMINATION: General: Intubated, sedated, RASS 0 Neuro: PERRLA, EOMI, MAEs HEENT: NCAT, sclerae white. Mild scleral edema Cardiovascular: Regular, no murmurs Lungs: No wheezes Abdomen: Soft, diminished bowel sounds, no masses Ext: Feet and hands are warmer, mottling of all 4 extremities improved, there is some superficial skin necrosis on tip of her right thumb and several toes on the left foot.   LABS:  BMET  Recent Labs Lab 08/27/16 1623 08/27/16 2152 08/28/16 0420  NA 137 139 140  K 4.0 4.0 3.8  CL 104 104 107  CO2 26 27 27   BUN 39* 43* 52*  CREATININE 1.82* 1.96* 2.21*  GLUCOSE 228* 221* 213*    Electrolytes  Recent Labs Lab 08/27/16 1623 08/27/16 2152 08/28/16 0420 08/28/16 1035  CALCIUM 7.9* 8.0* 7.6*  --   MG 1.9 1.8  --  1.8  PHOS 2.7 2.6 2.5  --     CBC  Recent Labs Lab 08/26/16 0532 08/27/16 0620 08/28/16 0420  WBC 39.4* 31.3* 34.0*  HGB 10.6* 9.9* 9.8*  HCT 32.0* 30.1* 29.8*  PLT 18* 18* 17*    Coag's  Recent Labs Lab 08/25/16 0847 08/26/16 0532 08/27/16 0620 08/28/16 0441  APTT  --  40* 33 33  INR 2.20 1.44  --  1.24  Sepsis Markers  Recent Labs Lab 08/24/16 1544 08/24/16 2117 08/25/16 0515 08/25/16 0847 08/26/16 0916 08/28/16 0420  LATICACIDVEN  --  14.0* 14.7* 15.2*  --   --   PROCALCITON >150.00  --   --   --  >150.00 138.48    ABG  Recent Labs Lab 08/24/16 2213 08/25/16 0046 08/25/16 0508  PHART 7.14* 7.23* 7.35  PCO2ART 28* 31* 35  PO2ART 101 94 75*    Liver Enzymes  Recent Labs Lab 08/25/16 0508  08/27/16 0505  08/27/16 1623 08/27/16 2152 08/28/16 0420  AST 139*  --  69*  --   --   --  111*  ALT 48  --  55*  --   --   --  57*   ALKPHOS 60  --  98  --   --   --  130*  BILITOT 0.9  --  0.7  --   --   --  0.6  ALBUMIN 2.3*  < > 2.2*  < > 2.2* 2.3* 2.0*  < > = values in this interval not displayed.  Cardiac Enzymes  Recent Labs Lab 08/24/16 2116 08/25/16 0508 08/28/16 0420  TROPONINI 0.77* 1.25* 0.72*    Glucose  Recent Labs Lab 08/28/16 0339 08/28/16 0719 08/28/16 0723 08/28/16 0941 08/28/16 0944 08/28/16 1139  GLUCAP 126* 82 167* 171* 185* 219*    CXR:  No significant change, low volumes, probable right effusion and possible left effusion with patchy airspace disease   ASSESSMENT / PLAN:  PULMONARY A: Acute hypoxemic respiratory failure ARDS/ALI Possible component of pulmonary edema Ventilator dependence P:   Cont vent support - settings reviewed and/or adjusted Cont vent bundle Daily SBT when meets criteria   CARDIOVASCULAR A:  Septic shock - vasopressor requirements cont to improve Mildly elevated troponin I P:  Resume NE as needed to maintain MAP > 65 mmHg Will need cardiology evaluation after critical phase of this illness has resolved  RENAL A:   AKI, oligo-anuric Lactic acidosis, resolved P:   Monitor BMET intermittently Monitor I/Os Correct electrolytes as indicated Nephrology following. Continue CRRT UF rate increased to 100 mL per hour 07/04  GASTROINTESTINAL A:   No issues P:   SUP: enteral PPI Continue TF protocol - initiated 07/02  HEMATOLOGIC A:   Severe DIC with thrombocytopenia and coagulopathy P:  DVT px: SCDs Monitor CBC intermittently Transfuse per usual guidelines Try to avoid platelets and FFP transfusions which might exacerbate DIC Follow DIC panel  INFECTIOUS A:   L  pyelonephritis/hydronephrosis Klebsiella bacteremia Severe sepsis Markedly elevated PCT P:   Monitor temp, WBC count Micro and abx as above Continue to trend PCT Renal ultrasound ordered 07/05 to rule out perinephric abscess  ENDOCRINE A:   DM 2,  controlled Relative adrenal insufficiency P:   Cont Lantus/SSI - initiated 07/03 Decrease hydrocortisone to 25 mg q 8 hrs 07/05  NEUROLOGIC A:   Acute septic encephalopathy ICU/vent associated discomfort P:   RASS goal: 0, -1 Continue PAD protocol   FAMILY: Husband updated at bedside    CCM time: 40 mins The above time includes time spent in consultation with patient and/or family members and reviewing care plan on multidisciplinary rounds  Billy Fischeravid Simonds, MD PCCM service Mobile [phone] Pager [phone] 08/28/2016 12:12 PM

## 2016-08-28 NOTE — Progress Notes (Signed)
[employer] [facility] [place] [facility]   PATIENT NAME: Megan Nixon    MR#:  [id number]  DATE OF BIRTH:  1950/07/03  SUBJECTIVE:   ON vent support Patient now off Pressors. CRRT tolerating it well Anuric Husband at bedside Opens eyes to verbal command  REVIEW OF SYSTEMS:    Review of Systems  Unable to perform ROS: Intubated   DRUG ALLERGIES:  No Known Allergies  VITALS:  Blood pressure 107/64, pulse 90, temperature (!) 96.8 F (36 C), resp. rate 13, height 5\' 8"  (1.727 m), weight 100.3 kg (221 lb 1.9 oz), SpO2 98 %.  PHYSICAL EXAMINATION:   Physical Exam  GENERAL:  66 y.o.-year-old patient lying in the bed. Looks critically ill, Limited exam. Pt intubated and on the vent EYES: Pupils equal, round, reactive to light HEENT: Head atraumatic, normocephalic. Oropharynx and nasopharynx clear. ET tube in place NECK:  Supple, no jugular venous distention. No thyroid enlargement, no tenderness.  LUNGS: Bilateral coarse breath sounds CARDIOVASCULAR: S1, S2 normal. Tachycardia ABDOMEN: Soft, nontender, nondistended. Bowel sounds present. No organomegaly or mass. Foley catheter in place EXTREMITIES: No cyanosis, clubbing or edema b/l.      LABORATORY PANEL:   CBC  Recent Labs Lab 08/28/16 0420  WBC 34.0*  HGB 9.8*  HCT 29.8*  PLT 17*   ------------------------------------------------------------------------------------------------------------------ Chemistries   Recent Labs Lab 08/28/16 0420 08/28/16 1035  NA 140  --   K 3.8  --   CL 107  --   CO2 27  --   GLUCOSE 213*  --   BUN 52*  --   CREATININE 2.21*  --   CALCIUM 7.6*  --   MG  --  1.8  AST 111*  --   ALT 57*  --   ALKPHOS 130*  --   BILITOT 0.6  --    ------------------------------------------------------------------------------------------------------------------  Cardiac Enzymes  Recent Labs Lab 08/28/16 0420  TROPONINI 0.72*    ------------------------------------------------------------------------------------------------------------------  RADIOLOGY:  US Renal  Result Date: 08/28/2016 CLINICAL DATA:  Intubated patient. 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 Clinical concern of perinephric abscess. EXAM: RENAL / URINARY TRACT ULTRASOUND COMPLETE COMPARISON:  Noncontrast abdominopelvic CT scan of August 24, 2016. FINDINGS: Right Kidney: Length: 13 cm. Echogenicity is approximately equal to that of the liver. No mass or hydronephrosis visualized. No abnormal perinephric fluid collections are observed. Left Kidney: Length: 12.5 cm. There is an upper pole cyst measuring 1.6 cm in greatest dimension. There is no hydronephrosis. The renal cortical echotexture is similar to that on the right. No abnormal perinephric fluid collections are observed. Bladder: Decompressed with a Foley catheter. IMPRESSION: Mildly increased renal cortical echotexture suggests medical renal disease. No perinephric abscess is observed. There is no hydronephrosis. No perinephric abscess is observed. If the patient's symptoms warrant further imaging, CT scanning would be the most useful next imaging step. Electronically Signed   By: David  [place] M.D.   On: 08/28/2016 11:40   Dg Chest Port 1 View  Result Date: 08/28/2016 CLINICAL DATA:  Respiratory failure, septic shock, acute renal failure, renal stone disease. Diabetes EXAM: PORTABLE CHEST 1 VIEW COMPARISON:  Portable chest x-ray of August 27, 2016 FINDINGS: The lungs are reasonably well inflated. The interstitial markings remain increased with confluent densities at the bases obscuring the hemidiaphragms. The cardiac silhouette is enlarged. The pulmonary vascularity is indistinct. The endotracheal tube tip lies approximately 3.1 cm above the carina. The esophagogastric tube tip projects below the inferior margin  of the image. The right internal jugular venous catheter  tip projects over the midportion of the SVC. IMPRESSION: Bibasilar atelectasis or pneumonia with small bilateral pleural effusions. Mild CHF. The support tubes are in reasonable position. Electronically Signed   By: David  [place] M.D.   On: 08/28/2016 07:05   Dg Chest Port 1 View  Result Date: 08/27/2016 CLINICAL DATA:  Respiratory failure EXAM: PORTABLE CHEST 1 VIEW COMPARISON:  08/26/2016 FINDINGS: Support devices are stable. Cardiomegaly with vascular congestion. Elevation of the right hemidiaphragm. Small bilateral effusions and bibasilar atelectasis or infiltrates. IMPRESSION: Small bilateral effusions. Cardiomegaly with vascular congestion, bibasilar atelectasis or infiltrates. No real change. Electronically Signed   By: Charlett NoseKevin  Dover M.D.   On: 08/27/2016 07:19     ASSESSMENT AND PLAN:   * Septic shockDue to Klebsiella bacteremia source urine -Intubated and on the ventilator -Off IV pressors -Continue IV fluids  * Left pyelonephritis with UPJ calculus and hydro-nephrosis -urine culture positive for Klebsiella -Patient was on IV Rocephin----now on IV meropenem -Consider ID consultation  * Acute kidney injury/pain unit secondary to septic shock ATN -On CRRT  * ARDS with Acute hypoxic respiratory failure, ventilator dependent  *Severe DIC due to #1  * Elevated troponin due to demand ischemia  * Acute toxic and metabolic encephalopathy  Patient is critically ill With high risk for cardiac arrest and death  All the records are reviewed and case discussed with Care Management/Social Workerr. Management plans discussed with the patient, family and they are in agreement.  CODE STATUS: FULL CODE  DVT Prophylaxis: SCDs  TOTAL TIME TAKING CARE OF THIS PATIENT: 25 minutes.    Miaa Latterell M.D on 08/28/2016 at 12:04 PM  Between 7am to 6pm - Pager - [phone]  After 6pm go to [email or web address] - password [email or web address]  [facility] [place] [facility]  Office  [phone]  CC: Primary  care physician; Danella PentonMiller, Mark F, MD  Note: This dictation was prepared with Dragon dictation along with smaller phrase technology. Any transcriptional errors that result from this process are unintentional.

## 2016-08-28 NOTE — Progress Notes (Signed)
[place] met with husband and brother-in-law in ICU hallway. Husband stated that his wife was in room IC-05 and dealing with problems due to kidney stones. Husband was appreciative of visits from the on-call [facility] on Monday. [place] will continue to follow through on next rounding.    08/28/16 1100  Clinical Encounter Type  Visited With Family  Visit Type Initial;Spiritual support  Consult/Referral To Chaplain  Spiritual Encounters  Spiritual Needs Emotional  Advance Directives (For Healthcare)  Does Patient Have a Medical Advance Directive? No  Would patient like information on creating a medical advance directive? No - Patient declined  [place]  Does Patient Have a Mental Health Advance Directive? No  Would patient like information on creating a mental health advance directive? No - Patient declined

## 2016-08-28 NOTE — Progress Notes (Addendum)
Reported troponin level of .72 to NP, no new orders given.

## 2016-08-28 NOTE — Progress Notes (Addendum)
Pt alert to voice, follows minimal commands, temp remains < 37 degr celsius with bair hugger on, although patient tends to push bair hugger overlay off her upper extremities and nods when asked if she is hot.  Peripheral pulses are improved with levophed off, patient's MAP consistently > 65, UOP near 0, minimal drainage into flexi seal and nephrostomy tube, CRRT has run with no complications today, dialysate bags changed, pt has extreme duskiness to left toes and right thumb, purpura on palms and soles of feet. There does appear to be some improvement this shift with pressor DCd and right radial arterial line removed.  Feedings tolerated w/ no s/sx distress.  Bowel sounds decreased this shift, ABD somewhat tight now, output into flexi seal decreased,  [facility]

## 2016-08-28 NOTE — Progress Notes (Signed)
Per Dr Sung AmabileSimonds verify that blood glucose drawn from arterial line and capillary blood glucose correlate reasonably.  If they do, discontinue ALine.  ALine glucose was 171, CBG from left hand was 185.  Per Dr Sung AmabileSimonds, DC ALine and it is ok to use CBGs for blood sugars.  Aline pulled, pressure held approx 10 minutes before oozing stopped.

## 2016-08-28 NOTE — Progress Notes (Signed)
Pharmacy Consult for CRRT Medication Adjustment  No Known Allergies  Patient Measurements: Height: 5\' 8"  (172.7 cm) Weight: 221 lb 1.9 oz (100.3 kg) IBW/kg (Calculated) : 63.9  Vital Signs: Temp: 96.8 F (36 C) (07/05 1200) Temp Source: Oral (07/05 1200) BP: 107/64 (07/05 1200) Pulse Rate: 90 (07/05 1200) Intake/Output from previous day: 07/04 0701 - 07/05 0700 In: 2197.4 [I.V.:272.4; EX/BM:8413G/GT:1825; IV Piggyback:100] Out: 2365 [Urine:85; Stool:400] Intake/Output from this shift: Total I/O In: 654.3 [I.V.:54.3; NG/GT:500; IV Piggyback:100] Out: 494 [Urine:13; Other:431; Stool:50]  Labs:  Recent Labs  08/26/16 0532  08/27/16 0505 08/27/16 0620  08/27/16 1623 08/27/16 2152 08/28/16 0420 08/28/16 0441 08/28/16 1035  WBC 39.4*  --   --  31.3*  --   --   --  34.0*  --   --   HGB 10.6*  --   --  9.9*  --   --   --  9.8*  --   --   HCT 32.0*  --   --  30.1*  --   --   --  29.8*  --   --   PLT 18*  --   --  18*  --   --   --  17*  --   --   APTT 40*  --   --  33  --   --   --   --  33  --   CREATININE 2.38*  2.40*  < > 1.97*  --   < > 1.82* 1.96* 2.21*  --   --   MG 1.7  < > 1.9  --   < > 1.9 1.8  --   --  1.8  PHOS 3.6  3.6  < > 3.2  --   < > 2.7 2.6 2.5  --   --   ALBUMIN 2.3*  < > 2.2*  --   < > 2.2* 2.3* 2.0*  --   --   PROT  --   --  5.5*  --   --   --   --  5.3*  --   --   AST  --   --  69*  --   --   --   --  111*  --   --   ALT  --   --  55*  --   --   --   --  57*  --   --   ALKPHOS  --   --  98  --   --   --   --  130*  --   --   BILITOT  --   --  0.7  --   --   --   --  0.6  --   --   < > = values in this interval not displayed. Estimated Creatinine Clearance: 30.2 mL/min (A) (by C-G formula based on SCr of 2.21 mg/dL (H)).  Medical History: Past Medical History:  Diagnosis Date  . Diabetes mellitus without complication (HCC)   . Hypertension   . Stroke [facility](HCC)     Assessment: 66 y/o F with a h/o DM, HTN, and CVA admitted with septic shock due to  pyelonephritis. Patient is now requiring CRRT.   Plan:  No medications require adjustment at present. Pharmacy will continue to monitor and adjust per consult.   Luisa HartChristy, Dohn Stclair D 08/28/2016,12:05 PM

## 2016-08-28 NOTE — Progress Notes (Signed)
[facility]  ROUNDING NOTE   Subjective:   Off vasopressors.   CRRT - tolerating treatment well. UOP 85. UF of 1783 net -70  Off vasopressin. Low dose norepinephrine 82mg  Objective:  Vital signs in last 24 hours:  Temp:  [95.4 F (35.2 C)-99.3 F (37.4 C)] 97.5 F (36.4 C) (07/05 0900) Pulse Rate:  [78-101] 90 (07/05 0900) Resp:  [15-21] 21 (07/05 0900) BP: (93-127)/(58-71) 112/66 (07/05 0900) SpO2:  [91 %-98 %] 98 % (07/05 0900) Arterial Line BP: (96-126)/(52-63) 122/62 (07/05 0600) FiO2 (%):  [40 %] 40 % (07/05 0800) Weight:  [100.3 kg (221 lb 1.9 oz)] 100.3 kg (221 lb 1.9 oz) (07/05 0327)  Weight change: -4 kg (-8 lb 13.1 oz) Filed Weights   08/24/16 1547 08/27/16 0700 08/28/16 0327  Weight: 89.6 kg (197 lb 8.5 oz) 104.3 kg (229 lb 15 oz) 100.3 kg (221 lb 1.9 oz)    Intake/Output: I/O last 3 completed shifts: In: 4995.2 [I.V.:1262.7; NG/GT:3632.5; IV Piggyback:100] Out: 2835 [Urine:125; Other:2310; Stool:400]   Intake/Output this shift:  Total I/O In: 170 [I.V.:20; NG/GT:150] Out: 673[Urine:13; Stool:50]  Physical Exam: General: Critically ill   Head: ETT  Eyes: Anicteric, PERRL  Neck:  trachea midline  Lungs:  PRVC FiO2 40%  Heart: tachycardia  Abdomen:  Soft, nontender, obese  Extremities: + peripheral edema. +cyanosis  Neurologic: Intubated, sedated  Skin: No lesions  Access: Left femoral temp HD catheter 7/2 Dr. SAlva Garnet   Basic Metabolic Panel:  Recent Labs Lab 08/27/16 0115 08/27/16 0505 08/27/16 1009 08/27/16 1623 08/27/16 2152 08/28/16 0420  NA 141 138 138 137 139 140  K 3.6 4.0 3.9 4.0 4.0 3.8  CL 107 103 103 104 104 107  CO2 '26 27 26 26 27 27  '$ GLUCOSE 230* 276* 276* 228* 221* 213*  BUN 37* 40* 39* 39* 43* 52*  CREATININE 2.07* 1.97* 2.06* 1.82* 1.96* 2.21*  CALCIUM 7.2* 7.6* 8.0* 7.9* 8.0* 7.6*  MG 1.8 1.9 1.9 1.9 1.8  --   PHOS 3.3 3.2 2.8 2.7 2.6 2.5    Liver Function Tests:  Recent Labs Lab 08/24/16 0919  08/24/16 2115 08/25/16 0508  08/27/16 0505 08/27/16 1009 08/27/16 1623 08/27/16 2152 08/28/16 0420  AST 83* 94* 139*  --  69*  --   --   --  111*  ALT 44 43 48  --  55*  --   --   --  57*  ALKPHOS 78 79 60  --  98  --   --   --  130*  BILITOT 0.9 1.0 0.9  --  0.7  --   --   --  0.6  PROT 6.7 5.8* 4.8*  --  5.5*  --   --   --  5.3*  ALBUMIN 3.2* 2.7* 2.3*  < > 2.2* 2.2* 2.2* 2.3* 2.0*  < > = values in this interval not displayed. No results for input(s): LIPASE, AMYLASE in the last 168 hours. No results for input(s): AMMONIA in the last 168 hours.  CBC:  Recent Labs Lab 08/24/16 1600 08/24/16 2118 08/25/16 0508 08/26/16 0532 08/27/16 0620 08/28/16 0420  WBC 23.4* 34.1* 32.2* 39.4* 31.3* 34.0*  NEUTROABS 21.3*  --  27.1* 35.0* 28.2* 30.7*  HGB 11.1* 11.9* 11.3* 10.6* 9.9* 9.8*  HCT 34.7* 36.1 32.9* 32.0* 30.1* 29.8*  MCV 91.9 91.3 88.6 87.3 87.2 88.6  PLT 69* 54* 32* 18* 18* 17*    Cardiac Enzymes:  Recent Labs Lab 08/24/16  1544 08/24/16 1600 08/24/16 2116 08/25/16 0508 08/28/16 0420  TROPONINI 0.13* 0.13* 0.77* 1.25* 0.72*    BNP: Invalid input(s): POCBNP  CBG:  Recent Labs Lab 08/27/16 1127 08/27/16 1543 08/27/16 2008 08/27/16 2324 08/28/16 0339  GLUCAP 258* 231* 153* 212* 126*    Microbiology: Results for orders placed or performed during the hospital encounter of 08/24/16  Blood Culture (routine x 2)     Status: Abnormal   Collection Time: 08/24/16  8:25 AM  Result Value Ref Range Status   Specimen Description BLOOD RIGHT ANTECUBITAL  Final   Special Requests   Final    BOTTLES DRAWN AEROBIC AND ANAEROBIC Blood Culture adequate volume   Culture  Setup Time   Final    GRAM NEGATIVE RODS IN BOTH AEROBIC AND ANAEROBIC BOTTLES CRITICAL RESULT CALLED TO, READ BACK BY AND VERIFIED WITH: JASON ROBBINS ON 08/24/16 AT 1933 QSD    Culture (A)  Final    KLEBSIELLA PNEUMONIAE SUSCEPTIBILITIES PERFORMED ON PREVIOUS CULTURE WITHIN THE LAST 5  DAYS. Performed at [facility], [place] [address], [place], [place] [address]    Report Status 08/27/2016 FINAL  Final  Blood Culture (routine x 2)     Status: Abnormal   Collection Time: 08/24/16  8:25 AM  Result Value Ref Range Status   Specimen Description BLOOD RIGHT ANTECUBITAL  Final   Special Requests   Final    BOTTLES DRAWN AEROBIC AND ANAEROBIC Blood Culture adequate volume   Culture  Setup Time   Final    GRAM NEGATIVE RODS IN BOTH AEROBIC AND ANAEROBIC BOTTLES CRITICAL RESULT CALLED TO, READ BACK BY AND VERIFIED WITH: JASON ROBBINS ON 08/24/16 AT 1933 QSD    Culture KLEBSIELLA PNEUMONIAE (A)  Final   Report Status 08/27/2016 FINAL  Final   Organism ID, Bacteria KLEBSIELLA PNEUMONIAE  Final      Susceptibility   Klebsiella pneumoniae - MIC*    AMPICILLIN >=32 RESISTANT Resistant     CEFAZOLIN <=4 SENSITIVE Sensitive     CEFEPIME <=1 SENSITIVE Sensitive     CEFTAZIDIME <=1 SENSITIVE Sensitive     CEFTRIAXONE <=1 SENSITIVE Sensitive     CIPROFLOXACIN <=0.25 SENSITIVE Sensitive     GENTAMICIN <=1 SENSITIVE Sensitive     IMIPENEM <=0.25 SENSITIVE Sensitive     TRIMETH/SULFA <=20 SENSITIVE Sensitive     AMPICILLIN/SULBACTAM 8 SENSITIVE Sensitive     PIP/TAZO <=4 SENSITIVE Sensitive     Extended ESBL NEGATIVE Sensitive     * KLEBSIELLA PNEUMONIAE  Blood Culture ID Panel (Reflexed)     Status: Abnormal   Collection Time: 08/24/16  8:25 AM  Result Value Ref Range Status   Enterococcus species NOT DETECTED NOT DETECTED Final   Listeria monocytogenes NOT DETECTED NOT DETECTED Final   Staphylococcus species NOT DETECTED NOT DETECTED Final   Staphylococcus aureus NOT DETECTED NOT DETECTED Final   Streptococcus species NOT DETECTED NOT DETECTED Final   Streptococcus agalactiae NOT DETECTED NOT DETECTED Final   Streptococcus pneumoniae NOT DETECTED NOT DETECTED Final   Streptococcus pyogenes NOT DETECTED NOT DETECTED Final   Acinetobacter baumannii NOT DETECTED NOT DETECTED  Final   Enterobacteriaceae species DETECTED (A) NOT DETECTED Final    Comment: Enterobacteriaceae represent a large family of gram-negative bacteria, not a single organism. CRITICAL RESULT CALLED TO, READ BACK BY AND VERIFIED WITH: JASON ROBBINS ON 08/24/16 AT 1933 QSD    Enterobacter cloacae complex NOT DETECTED NOT DETECTED Final   Escherichia coli NOT DETECTED NOT DETECTED  Final   Klebsiella oxytoca NOT DETECTED NOT DETECTED Final   Klebsiella pneumoniae DETECTED (A) NOT DETECTED Final    Comment: CRITICAL RESULT CALLED TO, READ BACK BY AND VERIFIED WITH: JASON ROBBINS ON 08/24/16 AT 1933 QSD    Proteus species NOT DETECTED NOT DETECTED Final   Serratia marcescens NOT DETECTED NOT DETECTED Final   Carbapenem resistance NOT DETECTED NOT DETECTED Final   Haemophilus influenzae NOT DETECTED NOT DETECTED Final   Neisseria meningitidis NOT DETECTED NOT DETECTED Final   Pseudomonas aeruginosa NOT DETECTED NOT DETECTED Final   Candida albicans NOT DETECTED NOT DETECTED Final   Candida glabrata NOT DETECTED NOT DETECTED Final   Candida krusei NOT DETECTED NOT DETECTED Final   Candida parapsilosis NOT DETECTED NOT DETECTED Final   Candida tropicalis NOT DETECTED NOT DETECTED Final  Urine culture     Status: Abnormal   Collection Time: 08/24/16  9:19 AM  Result Value Ref Range Status   Specimen Description URINE, CATHETERIZED  Final   Special Requests NONE  Final   Culture >=100,000 COLONIES/mL KLEBSIELLA PNEUMONIAE (A)  Final   Report Status 08/26/2016 FINAL  Final   Organism ID, Bacteria KLEBSIELLA PNEUMONIAE (A)  Final      Susceptibility   Klebsiella pneumoniae - MIC*    AMPICILLIN >=32 RESISTANT Resistant     CEFAZOLIN <=4 SENSITIVE Sensitive     CEFTRIAXONE <=1 SENSITIVE Sensitive     CIPROFLOXACIN <=0.25 SENSITIVE Sensitive     GENTAMICIN <=1 SENSITIVE Sensitive     IMIPENEM <=0.25 SENSITIVE Sensitive     NITROFURANTOIN 64 INTERMEDIATE Intermediate     TRIMETH/SULFA <=20  SENSITIVE Sensitive     AMPICILLIN/SULBACTAM 4 SENSITIVE Sensitive     PIP/TAZO <=4 SENSITIVE Sensitive     Extended ESBL NEGATIVE Sensitive     * >=100,000 COLONIES/mL KLEBSIELLA PNEUMONIAE  Body fluid culture     Status: None (Preliminary result)   Collection Time: 08/24/16  3:30 PM  Result Value Ref Range Status   Specimen Description FLUID  Final   Special Requests LEFT NEPHROSTOMY  Final   Gram Stain   Final    WBC PRESENT, PREDOMINANTLY PMN GRAM NEGATIVE RODS CYTOSPIN SMEAR Performed at [facility], [address], [place], [place] [address]    Culture FEW KLEBSIELLA PNEUMONIAE  Final   Report Status PENDING  Incomplete   Organism ID, Bacteria KLEBSIELLA PNEUMONIAE  Final      Susceptibility   Klebsiella pneumoniae - MIC*    AMPICILLIN >=32 RESISTANT Resistant     CEFAZOLIN <=4 SENSITIVE Sensitive     CEFEPIME <=1 SENSITIVE Sensitive     CEFTAZIDIME <=1 SENSITIVE Sensitive     CEFTRIAXONE <=1 SENSITIVE Sensitive     CIPROFLOXACIN <=0.25 SENSITIVE Sensitive     GENTAMICIN <=1 SENSITIVE Sensitive     IMIPENEM <=0.25 SENSITIVE Sensitive     TRIMETH/SULFA <=20 SENSITIVE Sensitive     AMPICILLIN/SULBACTAM 8 SENSITIVE Sensitive     PIP/TAZO <=4 SENSITIVE Sensitive     Extended ESBL NEGATIVE Sensitive     * FEW KLEBSIELLA PNEUMONIAE  MRSA PCR Screening     Status: None   Collection Time: 08/24/16  3:44 PM  Result Value Ref Range Status   MRSA by PCR NEGATIVE NEGATIVE Final    Comment:        The GeneXpert MRSA Assay (FDA approved for NASAL specimens only), is one component of a comprehensive MRSA colonization surveillance program. It is not intended to diagnose MRSA infection nor  to guide or monitor treatment for MRSA infections.   Culture, respiratory (NON-Expectorated)     Status: None   Collection Time: 08/25/16  7:00 AM  Result Value Ref Range Status   Specimen Description TRACHEAL ASPIRATE  Final   Special Requests NONE  Final   Gram Stain   Final     FEW WBC PRESENT, PREDOMINANTLY PMN FEW SQUAMOUS EPITHELIAL CELLS PRESENT NO ORGANISMS SEEN    Culture   Final    Consistent with normal respiratory flora. Performed at [facility], [place] [address] [place], [place] [address]    Report Status 08/27/2016 FINAL  Final  C difficile quick scan w PCR reflex     Status: None   Collection Time: 08/26/16  8:05 PM  Result Value Ref Range Status   C Diff antigen NEGATIVE NEGATIVE Final   C Diff toxin NEGATIVE NEGATIVE Final   C Diff interpretation No C. difficile detected.  Final    Coagulation Studies:  Recent Labs  08/26/16 0532 08/28/16 0441  LABPROT 17.7* 15.7*  INR 1.44 1.24    Urinalysis: No results for input(s): COLORURINE, LABSPEC, PHURINE, GLUCOSEU, HGBUR, BILIRUBINUR, KETONESUR, PROTEINUR, UROBILINOGEN, NITRITE, LEUKOCYTESUR in the last 72 hours.  Invalid input(s): APPERANCEUR    Imaging: Dg Chest Port 1 View  Result Date: 08/28/2016 CLINICAL DATA:  Respiratory failure, septic shock, acute renal failure, renal stone disease. Diabetes EXAM: PORTABLE CHEST 1 VIEW COMPARISON:  Portable chest x-ray of August 27, 2016 FINDINGS: The lungs are reasonably well inflated. The interstitial markings remain increased with confluent densities at the bases obscuring the hemidiaphragms. The cardiac silhouette is enlarged. The pulmonary vascularity is indistinct. The endotracheal tube tip lies approximately 3.1 cm above the carina. The esophagogastric tube tip projects below the inferior margin of the image. The right internal jugular venous catheter tip projects over the midportion of the SVC. IMPRESSION: Bibasilar atelectasis or pneumonia with small bilateral pleural effusions. Mild CHF. The support tubes are in reasonable position. Electronically Signed   By: David  Martinique M.D.   On: 08/28/2016 07:05   Dg Chest Port 1 View  Result Date: 08/27/2016 CLINICAL DATA:  Respiratory failure EXAM: PORTABLE CHEST 1 VIEW COMPARISON:  08/26/2016  FINDINGS: Support devices are stable. Cardiomegaly with vascular congestion. Elevation of the right hemidiaphragm. Small bilateral effusions and bibasilar atelectasis or infiltrates. IMPRESSION: Small bilateral effusions. Cardiomegaly with vascular congestion, bibasilar atelectasis or infiltrates. No real change. Electronically Signed   By: Rolm Baptise M.D.   On: 08/27/2016 07:19     Medications:   . feeding supplement (VITAL HIGH PROTEIN) 1,000 mL (08/28/16 0800)  . fentaNYL infusion INTRAVENOUS 75 mcg/hr (08/28/16 0913)  . pureflow 2,000 mL/hr at 08/28/16 0115   . chlorhexidine gluconate (MEDLINE KIT)  15 mL Mouth Rinse BID  . free water  50 mL Per Tube Q6H  . hydrocortisone sod succinate (SOLU-CORTEF) inj  25 mg Intravenous Q8H  . insulin aspart  0-20 Units Subcutaneous Q4H  . insulin glargine  10 Units Subcutaneous BID  . mouth rinse  15 mL Mouth Rinse 10 times per day  . pantoprazole sodium  40 mg Per Tube Q1200  . sodium chloride flush  10-40 mL Intracatheter Q12H   acetaminophen **OR** [DISCONTINUED] acetaminophen, albuterol, bisacodyl, fentaNYL (SUBLIMAZE) injection, midazolam, ondansetron **OR** ondansetron (ZOFRAN) IV, senna-docusate, sodium chloride flush  Assessment/ Plan:  Ms. Megan Nixon is a 66 y.o. black female Ms. Megan Nixon is a 66 y.o. black female with diabetes mellitus type  II, hypertension, history of CVA, who was admitted to [facility] on 08/24/2016 for septic shock  1. Acute renal failure with metabolic acidosis: baseline creatinine of 0.7 on 06/12/2016.  Anuric urine output. Off vasopressors.  Acute renal failure secondary obstructive uropathy, sepsis, hypotension and ATN.  - Continue CVVHD. Increase UF to 154m/hr  2. Septic Shock: Klebsiella.  Afebrile. Leukocytosis 34 (31.3) (39.4).  Klebsiella Off vasopresors this morning.  - Consult ID - reimage with CT  - Consider broadening antibiotics - hydrocortisone  3. Respiratory Failure requiring  mechanical ventilation:  - Continue supportive care  4. Anemia and thrombocytopenia with renal failure: concerning for DIC    LOS: 4Berwyn Amol Domanski 7/5/20189:25 AM

## 2016-08-28 NOTE — Progress Notes (Signed)
Inpatient Diabetes Program Recommendations  AACE/ADA: New Consensus Statement on Inpatient Glycemic Control (2015)  Target Ranges:  Prepandial:   less than 140 mg/dL      Peak postprandial:   less than 180 mg/dL (1-2 hours)      Critically ill patients:  140 - 180 mg/dL   Lab Results  Component Value Date   GLUCAP 126 (H) 08/28/2016    Review of Glycemic Control  Results for Megan Nixon, [name] (MRN [id number]) as of 08/28/2016 08:54  Ref. Range 08/27/2016 11:27 08/27/2016 15:43 08/27/2016 20:08 08/27/2016 23:24 08/28/2016 03:39  Glucose-Capillary Latest Ref Range: 65 - 99 mg/dL 782258 (H) 956231 (H) 213153 (H) 212 (H) 126 (H)    Diabetes history: Type 2  Current orders for Inpatient glycemic control: Novolog 0-20 units tid, Lantus 10 units bid,  Solucortef 50mg  IV  Inpatient Diabetes Program Recommendations:   Agree with current medications for blood sugar management.    Susette RacerJulie Jazzmin Newbold, RN, BA, MHA, CDE Diabetes Coordinator Inpatient Diabetes Program  [phone] (Team Pager) [phone] [facility] Office) 08/28/2016 8:57 AM

## 2016-08-28 NOTE — Procedures (Signed)
Hemodialysis Catheter Insertion Procedure Note Megan Nixon [id number] 03/20/1950  Procedure: Insertion of Hemodialysis Catheter Indications: CRRT  Procedure Details Consent: Unable to obtain consent because of emergent medical necessity. Time Out: Verified patient identification, verified procedure, site/side was marked, verified correct patient position, special equipment/implants available, medications/allergies/relevent history reviewed, required imaging and test results available.  Performed  Maximum sterile technique was used including antiseptics, cap, gloves, gown, hand hygiene, mask and sheet. Skin prep: Chlorhexidine; local anesthetic administered A antimicrobial bonded/coated double lumen catheter was placed in the left femoral vein due to patient being a dialysis patient and emergent situation using the Seldinger technique.  Evaluation Blood flow good Complications: No apparent complications Patient did tolerate procedure well.     HD catheter was exchanged over the guidewire   Bincy Varughese,AG-ACNP Pulmonary & Critical Care  Billy Fischeravid Kinza Gouveia, MD PCCM service Mobile [phone] Pager [phone] 08/28/2016 12:12 PM

## 2016-08-29 DIAGNOSIS — E119 Type 2 diabetes mellitus without complications: Secondary | ICD-10-CM

## 2016-08-29 DIAGNOSIS — R4182 Altered mental status, unspecified: Secondary | ICD-10-CM

## 2016-08-29 DIAGNOSIS — N189 Chronic kidney disease, unspecified: Secondary | ICD-10-CM

## 2016-08-29 DIAGNOSIS — D65 Disseminated intravascular coagulation [defibrination syndrome]: Secondary | ICD-10-CM

## 2016-08-29 LAB — RENAL FUNCTION PANEL
ALBUMIN: 2 g/dL — AB (ref 3.5–5.0)
ALBUMIN: 2.3 g/dL — AB (ref 3.5–5.0)
ALBUMIN: 2.1 g/dL — AB (ref 3.5–5.0)
ANION GAP: 6 (ref 5–15)
ANION GAP: 6 (ref 5–15)
ANION GAP: 6 (ref 5–15)
ANION GAP: 5 (ref 5–15)
Albumin: 2.2 g/dL — ABNORMAL LOW (ref 3.5–5.0)
BUN: 53 mg/dL — ABNORMAL HIGH (ref 6–20)
BUN: 51 mg/dL — AB (ref 6–20)
BUN: 46 mg/dL — ABNORMAL HIGH (ref 6–20)
BUN: 41 mg/dL — ABNORMAL HIGH (ref 6–20)
CALCIUM: 8.1 mg/dL — AB (ref 8.9–10.3)
CALCIUM: 8.2 mg/dL — AB (ref 8.9–10.3)
CHLORIDE: 105 mmol/L (ref 101–111)
CO2: 28 mmol/L (ref 22–32)
CO2: 28 mmol/L (ref 22–32)
CO2: 27 mmol/L (ref 22–32)
CO2: 27 mmol/L (ref 22–32)
CREATININE: 2.08 mg/dL — AB (ref 0.44–1.00)
CREATININE: 1.89 mg/dL — AB (ref 0.44–1.00)
Calcium: 8.4 mg/dL — ABNORMAL LOW (ref 8.9–10.3)
Calcium: 8.3 mg/dL — ABNORMAL LOW (ref 8.9–10.3)
Chloride: 104 mmol/L (ref 101–111)
Chloride: 105 mmol/L (ref 101–111)
Chloride: 107 mmol/L (ref 101–111)
Creatinine, Ser: 1.85 mg/dL — ABNORMAL HIGH (ref 0.44–1.00)
Creatinine, Ser: 1.69 mg/dL — ABNORMAL HIGH (ref 0.44–1.00)
GFR calc Af Amer: 31 mL/min — ABNORMAL LOW (ref >60)
GFR calc non Af Amer: 23 mL/min — ABNORMAL LOW (ref >60)
GFR calc non Af Amer: 27 mL/min — ABNORMAL LOW (ref >60)
GFR, EST AFRICAN AMERICAN: 27 mL/min — AB (ref >60)
GFR, EST AFRICAN AMERICAN: 30 mL/min — AB (ref >60)
GFR, EST AFRICAN AMERICAN: 35 mL/min — AB (ref >60)
GFR, EST NON AFRICAN AMERICAN: 26 mL/min — AB (ref >60)
GFR, EST NON AFRICAN AMERICAN: 30 mL/min — AB (ref >60)
GLUCOSE: 176 mg/dL — AB (ref 65–99)
GLUCOSE: 115 mg/dL — AB (ref 65–99)
Glucose, Bld: 184 mg/dL — ABNORMAL HIGH (ref 65–99)
Glucose, Bld: 155 mg/dL — ABNORMAL HIGH (ref 65–99)
PHOSPHORUS: 2.7 mg/dL (ref 2.5–4.6)
PHOSPHORUS: 2.8 mg/dL (ref 2.5–4.6)
PHOSPHORUS: 2.6 mg/dL (ref 2.5–4.6)
POTASSIUM: 4 mmol/L (ref 3.5–5.1)
POTASSIUM: 4.2 mmol/L (ref 3.5–5.1)
Phosphorus: 2.9 mg/dL (ref 2.5–4.6)
Potassium: 4 mmol/L (ref 3.5–5.1)
Potassium: 3.9 mmol/L (ref 3.5–5.1)
SODIUM: 138 mmol/L (ref 135–145)
Sodium: 139 mmol/L (ref 135–145)
Sodium: 138 mmol/L (ref 135–145)
Sodium: 139 mmol/L (ref 135–145)

## 2016-08-29 LAB — CBC WITH DIFFERENTIAL/PLATELET
BASOS ABS: 0 10*3/uL (ref 0–0.1)
Basophils Relative: 0 %
EOS ABS: 0 10*3/uL (ref 0–0.7)
EOS PCT: 0 %
HEMATOCRIT: 27.2 % — AB (ref 35.0–47.0)
HEMOGLOBIN: 8.8 g/dL — AB (ref 12.0–16.0)
Lymphocytes Relative: 10 %
Lymphs Abs: 3.9 10*3/uL — ABNORMAL HIGH (ref 1.0–3.6)
MCH: 28.2 pg (ref 26.0–34.0)
MCHC: 32.3 g/dL (ref 32.0–36.0)
MCV: 87.5 fL (ref 80.0–100.0)
MONOS PCT: 9 %
Monocytes Absolute: 3.5 10*3/uL — ABNORMAL HIGH (ref 0.2–0.9)
NEUTROS PCT: 81 %
Neutro Abs: 31.4 10*3/uL — ABNORMAL HIGH (ref 1.4–6.5)
Platelets: 27 10*3/uL — CL (ref 150–440)
RBC: 3.1 MIL/uL — AB (ref 3.80–5.20)
RDW: 17.3 % — ABNORMAL HIGH (ref 11.5–14.5)
WBC: 38.8 10*3/uL — AB (ref 3.6–11.0)
nRBC: 2 /100 WBC — ABNORMAL HIGH

## 2016-08-29 LAB — MAGNESIUM
MAGNESIUM: 1.8 mg/dL (ref 1.7–2.4)
MAGNESIUM: 1.7 mg/dL (ref 1.7–2.4)
Magnesium: 1.8 mg/dL (ref 1.7–2.4)
Magnesium: 1.8 mg/dL (ref 1.7–2.4)

## 2016-08-29 LAB — BLOOD GAS, ARTERIAL
ACID-BASE EXCESS: 4 mmol/L — AB (ref 0.0–2.0)
ALLENS TEST (PASS/FAIL): POSITIVE — AB
Bicarbonate: 29.7 mmol/L — ABNORMAL HIGH (ref 20.0–28.0)
FIO2: 0.35
Mode: POSITIVE
O2 SAT: 93.4 %
PEEP/CPAP: 5 cmH2O
PH ART: 7.39 (ref 7.350–7.450)
PO2 ART: 69 mmHg — AB (ref 83.0–108.0)
PRESSURE SUPPORT: 5 cmH2O
Patient temperature: 37
pCO2 arterial: 49 mmHg — ABNORMAL HIGH (ref 32.0–48.0)

## 2016-08-29 LAB — GLUCOSE, CAPILLARY
GLUCOSE-CAPILLARY: 190 mg/dL — AB (ref 65–99)
Glucose-Capillary: 111 mg/dL — ABNORMAL HIGH (ref 65–99)
Glucose-Capillary: 138 mg/dL — ABNORMAL HIGH (ref 65–99)
Glucose-Capillary: 151 mg/dL — ABNORMAL HIGH (ref 65–99)
Glucose-Capillary: 155 mg/dL — ABNORMAL HIGH (ref 65–99)
Glucose-Capillary: 156 mg/dL — ABNORMAL HIGH (ref 65–99)
Glucose-Capillary: 160 mg/dL — ABNORMAL HIGH (ref 65–99)
Glucose-Capillary: 99 mg/dL (ref 65–99)

## 2016-08-29 LAB — CBC
HEMATOCRIT: 29.2 % — AB (ref 35.0–47.0)
HEMOGLOBIN: 9.3 g/dL — AB (ref 12.0–16.0)
MCH: 27.4 pg (ref 26.0–34.0)
MCHC: 31.9 g/dL — AB (ref 32.0–36.0)
MCV: 86 fL (ref 80.0–100.0)
Platelets: 33 10*3/uL — ABNORMAL LOW (ref 150–440)
RBC: 3.4 MIL/uL — ABNORMAL LOW (ref 3.80–5.20)
RDW: 16.9 % — ABNORMAL HIGH (ref 11.5–14.5)
WBC: 47.8 10*3/uL — AB (ref 3.6–11.0)

## 2016-08-29 LAB — APTT: APTT: 33 s (ref 24–36)

## 2016-08-29 LAB — FIBRIN DERIVATIVES D-DIMER (ARMC ONLY)

## 2016-08-29 LAB — PHOSPHORUS: Phosphorus: 2.7 mg/dL (ref 2.5–4.6)

## 2016-08-29 LAB — PROCALCITONIN
PROCALCITONIN: 69.77 ng/mL
PROCALCITONIN: 44.49 ng/mL

## 2016-08-29 LAB — PROTIME-INR
INR: 1.26
Prothrombin Time: 15.9 seconds — ABNORMAL HIGH (ref 11.4–15.2)

## 2016-08-29 LAB — CALCIUM, IONIZED

## 2016-08-29 LAB — FIBRINOGEN: Fibrinogen: 284 mg/dL (ref 210–475)

## 2016-08-29 MED ORDER — HYDRALAZINE HCL 20 MG/ML IJ SOLN
10.0000 mg | INTRAMUSCULAR | Status: DC | PRN
  Administered 2016-08-30 – 2016-09-01 (×3): 10 mg via INTRAVENOUS
  Filled 2016-08-29 (×3): qty 1

## 2016-08-29 MED ORDER — VANCOMYCIN HCL IN DEXTROSE 1-5 GM/200ML-% IV SOLN
1000.0000 mg | INTRAVENOUS | Status: DC
  Administered 2016-08-30 – 2016-08-31 (×2): 1000 mg via INTRAVENOUS
  Filled 2016-08-29 (×2): qty 200

## 2016-08-29 MED ORDER — VANCOMYCIN HCL 10 G IV SOLR
1750.0000 mg | Freq: Once | INTRAVENOUS | Status: AC
  Administered 2016-08-29: 1750 mg via INTRAVENOUS
  Filled 2016-08-29: qty 1750

## 2016-08-29 MED ORDER — ORAL CARE MOUTH RINSE
15.0000 mL | Freq: Two times a day (BID) | OROMUCOSAL | Status: DC
  Administered 2016-08-29 – 2016-09-05 (×13): 15 mL via OROMUCOSAL

## 2016-08-29 MED ORDER — CHLORHEXIDINE GLUCONATE 0.12 % MT SOLN
15.0000 mL | Freq: Two times a day (BID) | OROMUCOSAL | Status: DC
  Administered 2016-08-29 – 2016-08-30 (×3): 15 mL via OROMUCOSAL
  Filled 2016-08-29 (×3): qty 15

## 2016-08-29 NOTE — Progress Notes (Signed)
PULMONARY / CRITICAL CARE MEDICINE   Name: Megan FerdinandSarah S Nixon MRN: [id number] DOB: [birth year]    ADMISSION DATE:  08/24/2016  PT PROFILE:   66 y.o. F with hx of DM, hypertension admitted with severe sepsis/septic shock due to pyelonephritis, left hydronephrosis, klebsiella bacteremia  MAJOR EVENTS/TEST RESULTS: 07/01 admitted to ICU via emergency department as above 07/01 CT renal stone study: 5.5 x 5.0 mm left UPJ calculus with high-grade obstructive findings 07/01 CTA chest: No PE, patchy bilateral infiltrates 07/01 urology consultation Griffin Dakin([place]): PCN placement recommended. Recommended 14 day course of antibiotics for complicated UTI. "Can plan definitive stone treatment once infection has been treated (between 2 and 6 weeks post PCN placement in most cases)"  07/01 left PCN tube placed by IR 07/02 echocardiogram: LVEF 55-60% 07/02 nephrology consultation - CRRT ordered 07/03 decreasing vasopressor requirements. Remains on CRRT. Severe DIC persists 07/04 Off vasopressin. Low dose norepinephrine. Awakens but not F/C. DIC persists 07/05 off of all vasopressors. More awake and responsive. Follows commands intermittently. 07/05 renal ultrasound (to rule out perinephric abscess):   INDWELLING DEVICES:: ETT 07/01 >>  R IJ CVL 07/01 >>  R radial art line 07/01 >> 07/05 L femoral HD cath 07/02 >>   MICRO DATA: MRSA PCR 07/01 >> NEG Urine 07/01 >> Klebsiella Resp 07/01 >> NOF Blood 07/01 >> 2/2 Klebsiella  ANTIMICROBIALS:  Vanc 07/01 >> 07/01 Pip-tazo 07/01 >> 07/02 Ceftriaxone 07/02 >> 07/05 Meropenem 07/05 >>    SUBJECTIVE:  RASS 0, intubated - synchronous, follows commands intermittently  VITAL SIGNS: BP 111/76   Pulse 75   Temp 97.7 F (36.5 C) (Oral)   Resp 15   Ht 5\' 8"  (1.727 m)   Wt 99.7 kg (219 lb 12.8 oz)   SpO2 93%   BMI 33.42 kg/m   HEMODYNAMICS:    VENTILATOR SETTINGS: Vent Mode: PSV FiO2 (%):  [35 %-40 %] 35 % Set Rate:  [14 bmp] 14 bmp Vt Set:   [500 mL-800 mL] 500 mL PEEP:  [5 cmH20] 5 cmH20 Pressure Support:  [5 cmH20] 5 cmH20 Plateau Pressure:  [17 cmH20] 17 cmH20  INTAKE / OUTPUT: I/O last 3 completed shifts: In: 3133.7 [I.V.:366.2; Other:100; NG/GT:2567.5; IV Piggyback:100] Out: 4193 [Urine:184; Other:3209; Stool:800]  PHYSICAL EXAMINATION: General: Intubated, sedated, RASS 0 Neuro: PERRLA, EOMI, MAEs HEENT: NCAT, sclerae white. Mild scleral edema Cardiovascular: Regular, no murmurs Lungs: No wheezes Abdomen: Soft, diminished bowel sounds, no masses Ext: Feet and hands are warmer, mottling of all 4 extremities improved, there is some superficial skin necrosis on tip of her right thumb and several toes on the left foot.   LABS:  BMET  Recent Labs Lab 08/28/16 1627 08/28/16 2219 08/29/16 0345  NA 137 137 138  K 4.2 4.1 4.0  CL 103 104 104  CO2 27 28 28   BUN 49* 54* 53*  CREATININE 2.07* 2.10* 2.08*  GLUCOSE 229* 187* 184*    Electrolytes  Recent Labs Lab 08/28/16 1627 08/28/16 2219 08/29/16 0345  CALCIUM 8.0* 8.1* 8.1*  MG 1.8 1.8 1.8  PHOS 2.7 2.8 2.7  2.7    CBC  Recent Labs Lab 08/27/16 0620 08/28/16 0420 08/29/16 0345  WBC 31.3* 34.0* 38.8*  HGB 9.9* 9.8* 8.8*  HCT 30.1* 29.8* 27.2*  PLT 18* 17* 27*    Coag's  Recent Labs Lab 08/26/16 0532 08/27/16 0620 08/28/16 0441 08/29/16 0345  APTT 40* 33 33 33  INR 1.44  --  1.24 1.26    Sepsis Markers  Recent Labs  Lab 08/24/16 2117 08/25/16 0515 08/25/16 0847 08/26/16 0916 08/28/16 0420 08/29/16 0345  LATICACIDVEN 14.0* 14.7* 15.2*  --   --   --   PROCALCITON  --   --   --  >150.00 138.48 69.77    ABG  Recent Labs Lab 08/24/16 2213 08/25/16 0046 08/25/16 0508  PHART 7.14* 7.23* 7.35  PCO2ART 28* 31* 35  PO2ART 101 94 75*    Liver Enzymes  Recent Labs Lab 08/25/16 0508  08/27/16 0505  08/28/16 0420  08/28/16 1627 08/28/16 2219 08/29/16 0345  AST 139*  --  69*  --  111*  --   --   --   --   ALT 48  --   55*  --  57*  --   --   --   --   ALKPHOS 60  --  98  --  130*  --   --   --   --   BILITOT 0.9  --  0.7  --  0.6  --   --   --   --   ALBUMIN 2.3*  < > 2.2*  < > 2.0*  < > 2.1* 2.0* 2.0*  < > = values in this interval not displayed.  Cardiac Enzymes  Recent Labs Lab 08/24/16 2116 08/25/16 0508 08/28/16 0420  TROPONINI 0.77* 1.25* 0.72*    Glucose  Recent Labs Lab 08/28/16 1601 08/28/16 2016 08/28/16 2343 08/29/16 0347 08/29/16 0711 08/29/16 0714  GLUCAP 218* 190* 186* 190* 160* 156*    CXR:  No significant change, low volumes, probable right effusion and possible left effusion with patchy airspace disease   ASSESSMENT / PLAN:  PULMONARY A: Acute hypoxemic respiratory failure ARDS/ALI Possible component of pulmonary edema Ventilator dependence P:   Cont vent support - settings reviewed and/or adjusted Cont vent bundle Daily SBT when meets criteria   CARDIOVASCULAR A:  Septic shock - Has been successfully weaned off of pressors Mildly elevated troponin I  RENAL A:   AKI, oligo-anuric Lactic acidosis, resolved P:   Monitor BMET intermittently Monitor I/Os Correct electrolytes as indicated Nephrology following. Continue CRRT UF rate increased to 100 mL per hour 07/04  GASTROINTESTINAL A:   No issues P:   SUP: enteral PPI Continue TF protocol - initiated 07/02  HEMATOLOGIC A:   Severe DIC with thrombocytopenia and coagulopathy P:  DVT px: SCDs Monitor CBC intermittently Transfuse per usual guidelines Try to avoid platelets and FFP transfusions which might exacerbate DIC Follow DIC panel  INFECTIOUS A:   L  pyelonephritis/hydronephrosis Klebsiella bacteremia Severe sepsis Markedly elevated PCT Renal ultrasound did not reveal any evidence of perinephric abscess, patient with continued increasing white count, will empirically add vancomycin to meropenem. If does not improve will obtain CT scan of abdomen for follow-up   ENDOCRINE A:    DM 2, controlled Relative adrenal insufficiency P:   Cont Lantus/SSI - initiated 07/03 Decrease hydrocortisone to 25 mg q 8 hrs 07/05  NEUROLOGIC A:   Acute septic encephalopathy ICU/vent associated discomfort P:   RASS goal: 0, -1 Continue PAD protocol   FAMILY: Husband updated at bedside    CCM time: 35 mins The above time includes time spent in consultation with patient and/or family members and reviewing care plan on multidisciplinary rounds  Tora Kindred, DO PCCM service Mobile [phone] Pager [phone] 08/29/2016 8:28 AM

## 2016-08-29 NOTE — Progress Notes (Signed)
Pharmacy Antibiotic Note  Megan Nixon is a 66 y.o. female admitted on 08/24/2016 with septic shock due to pyelonephritis.  Patient was previously on ceftriaxone for Klebsiella pneumoniae UTI and bacteremia. Pharmacy has been consulted for meropenem dosing. Patient is currently receiving CRRT. Vancomycin added 7/6.   Plan: Will continue meropenem 1 g iv q 12 hours.   Vancomycin 1750 mg iv once then 1000 mg iv q 24 hours. Will plan on checking a level prior to the third dose with a goal of 15-20 mcg/ml.   Height: 5\' 8"  (172.7 cm) Weight: 219 lb 12.8 oz (99.7 kg) IBW/kg (Calculated) : 63.9  Temp (24hrs), Avg:97.6 F (36.4 C), Min:96.8 F (36 C), Max:98.5 F (36.9 C)   Recent Labs Lab 08/24/16 0824  08/24/16 1201 08/24/16 1600  08/24/16 2117  08/25/16 0508 08/25/16 0515 08/25/16 0847  08/26/16 0532  08/27/16 0620  08/28/16 0420 08/28/16 1035 08/28/16 1627 08/28/16 2219 08/29/16 0345 08/29/16 1013  WBC 14.8*  --   --  23.4*  --   --   < > 32.2*  --   --   --  39.4*  --  31.3*  --  34.0*  --   --   --  38.8*  --   CREATININE  --   < >  --  2.57*  < >  --   --  2.96*  --   --   < > 2.38*  2.40*  < >  --   < > 2.21* 2.10* 2.07* 2.10* 2.08* 1.85*  LATICACIDVEN 10.6*  --  8.5*  --   --  14.0*  --   --  14.7* 15.2*  --   --   --   --   --   --   --   --   --   --   --   VANCORANDOM  --   --   --  15  --   --   --   --   --   --   --   --   --   --   --   --   --   --   --   --   --   < > = values in this interval not displayed.  Estimated Creatinine Clearance: 35.9 mL/min (A) (by C-G formula based on SCr of 1.85 mg/dL (H)).    No Known Allergies  Antimicrobials this admission: Vancomycin 7/1 x 1, 7/6 >> Azithromycin 7/1 x 1 Ceftriaxone 7/1 x 1, 7/3 >> 7/4 Zosyn 7/1 >> 7/3 meropenem 7/5 >>   Dose adjustments this admission:   Microbiology results: MRSA PCR: negative BCx: Klebsiella pneumoniae UCx: Klebsiella pneumoniae Nephrostomy tube fluid: Klebsiella  pneumoniae TA: normal flora C diff: negative  Thank you for allowing pharmacy to be a part of this patient's care.  Valentina GuChristy, Camdon Saetern D 08/29/2016 12:38 PM

## 2016-08-29 NOTE — Progress Notes (Signed)
CH. made a follow up visit for prayer and spiritual support to Pt. and family.

## 2016-08-29 NOTE — Progress Notes (Addendum)
CRRT not recoverable from inadvertently being unplugged, also would not rinseback.  Patient will lose blood in the cartridge and filter.  Writer is priming a new cartridge at this time

## 2016-08-29 NOTE — Progress Notes (Signed)
Patient on pressure support only as of 0738, fentanyl titrated down, paused at 0810.  Patient is calm, O2 sats in 90s, RRin teens,  encouraged to take deep breaths by Clinical research associatewriter. RT to draw ABG approx 0845, if WDL per Dr Lonn Georgiaonforti will likely extubate patient.  CRRT running w/o incident this AM.

## 2016-08-29 NOTE — Progress Notes (Signed)
Pharmacy Consult for CRRT Medication Adjustment  No Known Allergies  Patient Measurements: Height: 5\' 8"  (172.7 cm) Weight: 219 lb 12.8 oz (99.7 kg) IBW/kg (Calculated) : 63.9  Vital Signs: Temp: 97.7 F (36.5 C) (07/06 1200) Temp Source: Oral (07/06 1200) BP: 163/87 (07/06 1200) Pulse Rate: 89 (07/06 1200) Intake/Output from previous day: 07/05 0701 - 07/06 0700 In: 2116.3 [I.V.:236.3; NG/GT:1680; IV Piggyback:100] Out: 2976 [Urine:129; Stool:400] Intake/Output from this shift: Total I/O In: 473.3 [P.O.:25; I.V.:8.3; Other:50; NG/GT:40; IV Piggyback:350] Out: 741 [Urine:60; Other:601; Stool:80]  Labs:  Recent Labs  08/27/16 0505 08/27/16 0620  08/28/16 0420 08/28/16 0441  08/28/16 2219 08/29/16 0345 08/29/16 1013  WBC  --  31.3*  --  34.0*  --   --   --  38.8*  --   HGB  --  9.9*  --  9.8*  --   --   --  8.8*  --   HCT  --  30.1*  --  29.8*  --   --   --  27.2*  --   PLT  --  18*  --  17*  --   --   --  27*  --   APTT  --  33  --   --  33  --   --  33  --   CREATININE 1.97*  --   < > 2.21*  --   < > 2.10* 2.08* 1.85*  MG 1.9  --   < >  --   --   < > 1.8 1.8 1.8  PHOS 3.2  --   < > 2.5  --   < > 2.8 2.7  2.7 2.8  ALBUMIN 2.2*  --   < > 2.0*  --   < > 2.0* 2.0* 2.3*  PROT 5.5*  --   --  5.3*  --   --   --   --   --   AST 69*  --   --  111*  --   --   --   --   --   ALT 55*  --   --  57*  --   --   --   --   --   ALKPHOS 98  --   --  130*  --   --   --   --   --   BILITOT 0.7  --   --  0.6  --   --   --   --   --   < > = values in this interval not displayed. Estimated Creatinine Clearance: 35.9 mL/min (A) (by C-G formula based on SCr of 1.85 mg/dL (H)).  Medical History: Past Medical History:  Diagnosis Date  . Diabetes mellitus without complication (HCC)   . Hypertension   . Stroke [facility](HCC)     Assessment: 66 y/o F with a h/o DM, HTN, and CVA admitted with septic shock due to pyelonephritis. Patient is now requiring CRRT.   Plan:  No medications require  adjustment at present. Pharmacy will continue to monitor and adjust per consult.   Luisa HartChristy, Chelise Hanger D 08/29/2016,12:38 PM

## 2016-08-29 NOTE — Progress Notes (Addendum)
Per Dr Lonn Georgiaonforti ABG is acceptable, proceed with extubation.  Patient extubated by RT at 0910, on 4 liters Edie O2, tolerating well.  O2 sats 96%  Writer observes that duskiness of right thumb appears slightly improved, but right toes/foot appear to be worsening and left toes now also appear cyanotic

## 2016-08-29 NOTE — Consult Note (Signed)
[facility] Vascular Consult Note  MRN : [id number]  Megan Nixon is a 66 y.o. ([birth year]) female who presents with chief complaint of  Chief Complaint  Patient presents with  . Altered Mental Status  .  History of Present Illness: I am asked to evaluate the patient by Dr. Lonn Georgiaonforti for ischemic changes to several toes as well as her right thumb.  The patient is a 66 year old woman who was admitted to the hospital 5 days ago with confusion and signs and symptoms consistent with sepsis. She was found to have both a urinary tract infection as well as multifocal pneumonia.   Over the course of the next several days her condition deteriorated she required intubation and significant vasopressor support. On 08/25/2016 she required initiation of CRRT secondary to kidney failure. Her clinical profile fit DIC.  Over the past 48 hours staff was noted mottling of both great toes as well as the right third toe and the right thumb with patchy areas of the right palm  Over the past 36 hours she is improved significantly, upon my arrival she is extubated she is following commands and speaking upon questioning. She denies foot pain or hand pain at this time.    Current Facility-Administered Medications  Medication Dose Route Frequency Provider Last Rate Last Dose  . acetaminophen (TYLENOL) tablet 650 mg  650 mg Per Tube Q6H PRN Merwyn KatosSimonds, David B, MD      . albuterol (PROVENTIL) (2.5 MG/3ML) 0.083% nebulizer solution 2.5 mg  2.5 mg Nebulization Q2H PRN Shaune Pollackhen, Qing, MD      . bisacodyl (DULCOLAX) EC tablet 5 mg  5 mg Oral Daily PRN Shaune Pollackhen, Qing, MD      . chlorhexidine (PERIDEX) 0.12 % solution 15 mL  15 mL Mouth Rinse BID Conforti, John, DO      . fentaNYL (SUBLIMAZE) injection 50-100 mcg  50-100 mcg Intravenous Q1H PRN Marylou Flesherukov, Magadalene S, NP   100 mcg at 08/29/16 0602  . heparin injection 1,000-6,000 Units  1,000-6,000 Units CRRT PRN Gwendolyn FillVarughese, Bincy S, NP   3,400 Units at 08/28/16  2223  . hydrALAZINE (APRESOLINE) injection 10 mg  10 mg Intravenous Q4H PRN Eugenie NorrieBlakeney, Dana G, NP      . hydrocortisone sodium succinate (SOLU-CORTEF) 100 MG injection 25 mg  25 mg Intravenous Q8H Merwyn KatosSimonds, David B, MD   25 mg at 08/29/16 1153  . insulin aspart (novoLOG) injection 0-20 Units  0-20 Units Subcutaneous Q4H Varughese, Bincy S, NP   4 Units at 08/29/16 1605  . insulin glargine (LANTUS) injection 10 Units  10 Units Subcutaneous BID Merwyn KatosSimonds, David B, MD   10 Units at 08/29/16 0935  . MEDLINE mouth rinse  15 mL Mouth Rinse q12n4p Conforti, John, DO   15 mL at 08/29/16 1606  . meropenem (MERREM) 1 g in sodium chloride 0.9 % 100 mL IVPB  1 g Intravenous Q12H Valentina GuChristy, Scott D, RPH   Stopped at 08/29/16 1005  . midazolam (VERSED) injection 2 mg  2 mg Intravenous Q1H PRN Merwyn KatosSimonds, David B, MD   2 mg at 08/27/16 1824  . ondansetron (ZOFRAN) tablet 4 mg  4 mg Oral Q6H PRN Shaune Pollackhen, Qing, MD       Or  . ondansetron [facility](ZOFRAN) injection 4 mg  4 mg Intravenous Q6H PRN Shaune Pollackhen, Qing, MD      . pantoprazole sodium (PROTONIX) 40 mg/20 mL oral suspension 40 mg  40 mg Per Tube Q1200 Merwyn KatosSimonds, David B, MD  40 mg at 08/29/16 1153  . pureflow IV solution for Dialysis   CRRT Continuous Kolluru, Sarath, MD 2,000 mL/hr at 08/29/16 1135    . senna-docusate (Senokot-S) tablet 1 tablet  1 tablet Oral QHS PRN Shaune Pollack, MD      . sodium chloride flush (NS) 0.9 % injection 10-40 mL  10-40 mL Intracatheter Q12H Sudini, Srikar, MD   10 mL at 08/29/16 0935  . sodium chloride flush (NS) 0.9 % injection 10-40 mL  10-40 mL Intracatheter PRN Sudini, Wardell Heath, MD      . Melene Muller ON 08/30/2016] vancomycin (VANCOCIN) IVPB 1000 mg/200 mL premix  1,000 mg Intravenous Q24H Valentina Gu, [facility]        Past Medical History:  Diagnosis Date  . Diabetes mellitus without complication (HCC)   . Hypertension   . Stroke [facility])     Past Surgical History:  Procedure Laterality Date  . BREAST EXCISIONAL BIOPSY Bilateral 1971   neg  . IR  NEPHROSTOMY PLACEMENT LEFT  08/24/2016    Social History Social History  Substance Use Topics  . Smoking status: Never Smoker  . Smokeless tobacco: Never Used  . Alcohol use No    Family History Family History  Problem Relation Age of Onset  . Breast cancer Neg Hx   No family history of bleeding/clotting disorders, porphyria or autoimmune disease   No Known Allergies   REVIEW OF SYSTEMS (Negative unless checked)  Constitutional: [] Weight loss  [] Fever  [] Chills Cardiac: [] Chest pain   [] Chest pressure   [] Palpitations   [] Shortness of breath when laying flat   [] Shortness of breath at rest   [] Shortness of breath with exertion. Vascular:  [] Pain in legs with walking   [] Pain in legs at rest   [] Pain in legs when laying flat   [] Claudication   [] Pain in feet when walking  [] Pain in feet at rest  [] Pain in feet when laying flat   [] History of DVT   [] Phlebitis   [x] Swelling in legs   [] Varicose veins   [] Non-healing ulcers Pulmonary:   [] Uses home oxygen   [] Productive cough   [] Hemoptysis   [] Wheeze  [] COPD   [] Asthma Neurologic:  [] Dizziness  [] Blackouts   [] Seizures   [] History of stroke   [] History of TIA  [] Aphasia   [] Temporary blindness   [] Dysphagia   [] Weakness or numbness in arms   [] Weakness or numbness in legs Musculoskeletal:  [] Arthritis   [] Joint swelling   [] Joint pain   [] Low back pain Hematologic:  [] Easy bruising  [] Easy bleeding   [] Hypercoagulable state   [] Anemic  [] Hepatitis Gastrointestinal:  [] Blood in stool   [] Vomiting blood  [] Gastroesophageal reflux/heartburn   [] Difficulty swallowing. Genitourinary:  [x] Chronic kidney disease   [] Difficult urination  [] Frequent urination  [] Burning with urination   [] Blood in urine Skin:  [] Rashes   [] Ulcers   [] Wounds Psychological:  [] History of anxiety   []  History of major depression.   Physical Examination  Vitals:   08/29/16 1500 08/29/16 1600 08/29/16 1700 08/29/16 1800  BP: (!) 153/89 (!) 149/80 (!) 149/76 (!)  152/76  Pulse: 90 81 72 80  Resp: 15 13 11    Temp:  98.1 F (36.7 C)    TempSrc:  Axillary    SpO2: 96% 96% 95% 96%  Weight:      Height:       Body mass index is 33.42 kg/m.  Head: [place]/AT, No temporalis wasting. Prominent temp pulse not noted. Ear/Nose/Throat: Nares w/o erythema  or drainage, oropharynx w/o obsrtuction, Mallampati class IV.  Eyes: PERRLA, Sclera nonicteric.  Neck: Supple, no nuchal rigidity.  No bruit or JVD.  Pulmonary:  Breath sounds equal bilaterally, no use of accessory muscles.  Cardiac: RRR, normal S1, S2, no Murmurs, rubs or gallops. Vascular:  there are 2+ radial pulses bilaterally there are 2+ posterior tibial and dorsalis pedis pulses bilaterally. Gastrointestinal: soft, non-tender, non-distended.  Musculoskeletal: Moves all extremities.  No deformity or atrophy. No edema. Neurologic: CN 2-12 intact. Symmetrical.  Speech is fluent.  Psychiatric: Judgment intact, Mood & affect appropriate for pt's clinical situation. Dermatologic: there is blistering and gangrenous changes to both great toes as well as the right third toe. There is blistering and some mottling of the palm and right thumb. This appears much more superficial.  Lymph : No Cervical,  or Inguinal lymphadenopathy.      CBC Lab Results  Component Value Date   WBC 38.8 (H) 08/29/2016   HGB 8.8 (L) 08/29/2016   HCT 27.2 (L) 08/29/2016   MCV 87.5 08/29/2016   PLT 27 (LL) 08/29/2016    BMET    Component Value Date/Time   NA 138 08/29/2016 1607   K 4.2 08/29/2016 1607   CL 105 08/29/2016 1607   CO2 27 08/29/2016 1607   GLUCOSE 155 (H) 08/29/2016 1607   BUN 46 (H) 08/29/2016 1607   CREATININE 1.89 (H) 08/29/2016 1607   CALCIUM 8.3 (L) 08/29/2016 1607   GFRNONAA 26 (L) 08/29/2016 1607   GFRAA 30 (L) 08/29/2016 1607   Estimated Creatinine Clearance: 35.2 mL/min (A) (by C-G formula based on SCr of 1.89 mg/dL (H)).  COAG Lab Results  Component Value Date   INR 1.26 08/29/2016   INR  1.24 08/28/2016   INR 1.44 08/26/2016    Assessment/Plan Patchy necrosis secondary to DIC with intact pulses:  No indication for emergent operation at this time. I would advocate for continued observation. I do believe there her toes will progress and she may require amputation but they are not adversely affecting her overall condition and she is certainly not medically fit for surgery at this time. I believe her hand will recover with time  2.  DIC with thrombocytopenia:  Continue supportive care monitor platelet count  3.  Acute renal failure:  Continue CRRT monitor BUN and creatinine avoid nephrotoxic drugs and dehydration as well as hypotension  4.  Diabetes mellitus:  Continue sliding scale  5. Klebsiella sepsis: Continue antibiotics as prescribed   Levora Dredge, MD  08/29/2016 6:42 PM

## 2016-08-29 NOTE — Progress Notes (Signed)
Placed on high fowlers position, cuff deflated, suctioned orally and endotracheally and then extubated to 4 lpm O2

## 2016-08-29 NOTE — Progress Notes (Signed)
Writer notified MD Conforti and NP Annabelle Harmanana of worsening duskiness of patient's lower extremities, will order vascular consult.  Also asked for a diet, begin clear liquids

## 2016-08-29 NOTE — Progress Notes (Addendum)
Per Dr Wynelle LinkKolluru we will discontinue CRRT tomorrow and begin intermittent HD.  Writer informed Dr Wynelle LinkKolluru that patient's access clotted the past two nights, necessitating a cartridge change each time (current cartridge has 28+ hours left).  He stated that if the CRRT clots tonight discontinue it and patient will have HD in the AM.  Do not restart with a new cartridge.   If CRRT clots today writer will attempt to declot and restart with new cartridge d/t no HD until tomorrow

## 2016-08-29 NOTE — Progress Notes (Signed)
[facility]  ROUNDING NOTE   Subjective:   Off vasopressors. Extubated this morning.  Husband at bedside.   CRRT - tolerating treatment well.   Objective:  Vital signs in last 24 hours:  Temp:  [96.8 F (36 C)-98.5 F (36.9 C)] 97.7 F (36.5 C) (07/06 0800) Pulse Rate:  [71-98] 87 (07/06 1000) Resp:  [11-20] 13 (07/06 1000) BP: (93-148)/(50-83) 146/79 (07/06 1000) SpO2:  [90 %-100 %] 95 % (07/06 1000) FiO2 (%):  [35 %-40 %] 35 % (07/06 0801) Weight:  [99.7 kg (219 lb 12.8 oz)] 99.7 kg (219 lb 12.8 oz) (07/06 0419)  Weight change: -0.6 kg (-1 lb 5.2 oz) Filed Weights   08/27/16 0700 08/28/16 0327 08/29/16 0419  Weight: 104.3 kg (229 lb 15 oz) 100.3 kg (221 lb 1.9 oz) 99.7 kg (219 lb 12.8 oz)    Intake/Output: I/O last 3 completed shifts: In: 3133.7 [I.V.:366.2; Other:100; NG/GT:2567.5; IV Piggyback:100] Out: 4193 [Urine:184; Other:3209; Stool:800]   Intake/Output this shift:  Total I/O In: 428.3 [I.V.:8.3; Other:30; NG/GT:40; IV Piggyback:350] Out: 252 [Urine:10; Other:242]  Physical Exam: General: Critically ill   Head: ETT  Eyes: Anicteric, PERRL  Neck:  trachea midline  Lungs:  Clear bilaterally   Heart: tachycardia  Abdomen:  Soft, nontender, obese  Extremities:  +cyanosis  Neurologic: Intubated, sedated  Skin: No lesions  Access: Left femoral temp HD catheter 7/2 Dr. Sung AmabileSimonds    Basic Metabolic Panel:  Recent Labs Lab 08/28/16 1035 08/28/16 1627 08/28/16 2219 08/29/16 0345 08/29/16 1013  NA 138 137 137 138 139  K 4.0 4.2 4.1 4.0 4.0  CL 105 103 104 104 105  CO2 24 27 28 28 28   GLUCOSE 210* 229* 187* 184* 176*  BUN 50* 49* 54* 53* 51*  CREATININE 2.10* 2.07* 2.10* 2.08* 1.85*  CALCIUM 7.7* 8.0* 8.1* 8.1* 8.4*  MG 1.8 1.8 1.8 1.8 1.8  PHOS 2.6 2.7 2.8 2.7  2.7 2.8    Liver Function Tests:  Recent Labs Lab 08/24/16 0919 08/24/16 2115 08/25/16 0508  08/27/16 0505  08/28/16 0420 08/28/16 1035 08/28/16 1627 08/28/16 2219  08/29/16 0345 08/29/16 1013  AST 83* 94* 139*  --  69*  --  111*  --   --   --   --   --   ALT 44 43 48  --  55*  --  57*  --   --   --   --   --   ALKPHOS 78 79 60  --  98  --  130*  --   --   --   --   --   BILITOT 0.9 1.0 0.9  --  0.7  --  0.6  --   --   --   --   --   PROT 6.7 5.8* 4.8*  --  5.5*  --  5.3*  --   --   --   --   --   ALBUMIN 3.2* 2.7* 2.3*  < > 2.2*  < > 2.0* 2.0* 2.1* 2.0* 2.0* 2.3*  < > = values in this interval not displayed. No results for input(s): LIPASE, AMYLASE in the last 168 hours. No results for input(s): AMMONIA in the last 168 hours.  CBC:  Recent Labs Lab 08/25/16 0508 08/26/16 0532 08/27/16 0620 08/28/16 0420 08/29/16 0345  WBC 32.2* 39.4* 31.3* 34.0* 38.8*  NEUTROABS 27.1* 35.0* 28.2* 30.7* 31.4*  HGB 11.3* 10.6* 9.9* 9.8* 8.8*  HCT 32.9* 32.0* 30.1* 29.8* 27.2*  MCV 88.6 87.3 87.2 88.6 87.5  PLT 32* 18* 18* 17* 27*    Cardiac Enzymes:  Recent Labs Lab 08/24/16 1544 08/24/16 1600 08/24/16 2116 08/25/16 0508 08/28/16 0420  TROPONINI 0.13* 0.13* 0.77* 1.25* 0.72*    BNP: Invalid input(s): POCBNP  CBG:  Recent Labs Lab 08/28/16 2016 08/28/16 2343 08/29/16 0347 08/29/16 0711 08/29/16 0714  GLUCAP 190* 186* 190* 160* 156*    Microbiology: Results for orders placed or performed during the hospital encounter of 08/24/16  Blood Culture (routine x 2)     Status: Abnormal   Collection Time: 08/24/16  8:25 AM  Result Value Ref Range Status   Specimen Description BLOOD RIGHT ANTECUBITAL  Final   Special Requests   Final    BOTTLES DRAWN AEROBIC AND ANAEROBIC Blood Culture adequate volume   Culture  Setup Time   Final    GRAM NEGATIVE RODS IN BOTH AEROBIC AND ANAEROBIC BOTTLES CRITICAL RESULT CALLED TO, READ BACK BY AND VERIFIED WITH: JASON ROBBINS ON 08/24/16 AT 1933 QSD    Culture (A)  Final    KLEBSIELLA PNEUMONIAE SUSCEPTIBILITIES PERFORMED ON PREVIOUS CULTURE WITHIN THE LAST 5 DAYS. Performed at [facility],  [address] [place], [place] [address]    Report Status 08/27/2016 FINAL  Final  Blood Culture (routine x 2)     Status: Abnormal   Collection Time: 08/24/16  8:25 AM  Result Value Ref Range Status   Specimen Description BLOOD RIGHT ANTECUBITAL  Final   Special Requests   Final    BOTTLES DRAWN AEROBIC AND ANAEROBIC Blood Culture adequate volume   Culture  Setup Time   Final    GRAM NEGATIVE RODS IN BOTH AEROBIC AND ANAEROBIC BOTTLES CRITICAL RESULT CALLED TO, READ BACK BY AND VERIFIED WITH: JASON ROBBINS ON 08/24/16 AT 1933 QSD    Culture KLEBSIELLA PNEUMONIAE (A)  Final   Report Status 08/27/2016 FINAL  Final   Organism ID, Bacteria KLEBSIELLA PNEUMONIAE  Final      Susceptibility   Klebsiella pneumoniae - MIC*    AMPICILLIN >=32 RESISTANT Resistant     CEFAZOLIN <=4 SENSITIVE Sensitive     CEFEPIME <=1 SENSITIVE Sensitive     CEFTAZIDIME <=1 SENSITIVE Sensitive     CEFTRIAXONE <=1 SENSITIVE Sensitive     CIPROFLOXACIN <=0.25 SENSITIVE Sensitive     GENTAMICIN <=1 SENSITIVE Sensitive     IMIPENEM <=0.25 SENSITIVE Sensitive     TRIMETH/SULFA <=20 SENSITIVE Sensitive     AMPICILLIN/SULBACTAM 8 SENSITIVE Sensitive     PIP/TAZO <=4 SENSITIVE Sensitive     Extended ESBL NEGATIVE Sensitive     * KLEBSIELLA PNEUMONIAE  Blood Culture ID Panel (Reflexed)     Status: Abnormal   Collection Time: 08/24/16  8:25 AM  Result Value Ref Range Status   Enterococcus species NOT DETECTED NOT DETECTED Final   Listeria monocytogenes NOT DETECTED NOT DETECTED Final   Staphylococcus species NOT DETECTED NOT DETECTED Final   Staphylococcus aureus NOT DETECTED NOT DETECTED Final   Streptococcus species NOT DETECTED NOT DETECTED Final   Streptococcus agalactiae NOT DETECTED NOT DETECTED Final   Streptococcus pneumoniae NOT DETECTED NOT DETECTED Final   Streptococcus pyogenes NOT DETECTED NOT DETECTED Final   Acinetobacter baumannii NOT DETECTED NOT DETECTED Final   Enterobacteriaceae species DETECTED  (A) NOT DETECTED Final    Comment: Enterobacteriaceae represent a large family of gram-negative bacteria, not a single organism. CRITICAL RESULT CALLED TO, READ BACK BY AND VERIFIED WITH: JASON ROBBINS ON  08/24/16 AT 1933 QSD    Enterobacter cloacae complex NOT DETECTED NOT DETECTED Final   Escherichia coli NOT DETECTED NOT DETECTED Final   Klebsiella oxytoca NOT DETECTED NOT DETECTED Final   Klebsiella pneumoniae DETECTED (A) NOT DETECTED Final    Comment: CRITICAL RESULT CALLED TO, READ BACK BY AND VERIFIED WITH: JASON ROBBINS ON 08/24/16 AT 1933 QSD    Proteus species NOT DETECTED NOT DETECTED Final   Serratia marcescens NOT DETECTED NOT DETECTED Final   Carbapenem resistance NOT DETECTED NOT DETECTED Final   Haemophilus influenzae NOT DETECTED NOT DETECTED Final   Neisseria meningitidis NOT DETECTED NOT DETECTED Final   Pseudomonas aeruginosa NOT DETECTED NOT DETECTED Final   Candida albicans NOT DETECTED NOT DETECTED Final   Candida glabrata NOT DETECTED NOT DETECTED Final   Candida krusei NOT DETECTED NOT DETECTED Final   Candida parapsilosis NOT DETECTED NOT DETECTED Final   Candida tropicalis NOT DETECTED NOT DETECTED Final  Urine culture     Status: Abnormal   Collection Time: 08/24/16  9:19 AM  Result Value Ref Range Status   Specimen Description URINE, CATHETERIZED  Final   Special Requests NONE  Final   Culture >=100,000 COLONIES/mL KLEBSIELLA PNEUMONIAE (A)  Final   Report Status 08/26/2016 FINAL  Final   Organism ID, Bacteria KLEBSIELLA PNEUMONIAE (A)  Final      Susceptibility   Klebsiella pneumoniae - MIC*    AMPICILLIN >=32 RESISTANT Resistant     CEFAZOLIN <=4 SENSITIVE Sensitive     CEFTRIAXONE <=1 SENSITIVE Sensitive     CIPROFLOXACIN <=0.25 SENSITIVE Sensitive     GENTAMICIN <=1 SENSITIVE Sensitive     IMIPENEM <=0.25 SENSITIVE Sensitive     NITROFURANTOIN 64 INTERMEDIATE Intermediate     TRIMETH/SULFA <=20 SENSITIVE Sensitive     AMPICILLIN/SULBACTAM 4  SENSITIVE Sensitive     PIP/TAZO <=4 SENSITIVE Sensitive     Extended ESBL NEGATIVE Sensitive     * >=100,000 COLONIES/mL KLEBSIELLA PNEUMONIAE  Body fluid culture     Status: None   Collection Time: 08/24/16  3:30 PM  Result Value Ref Range Status   Specimen Description FLUID  Final   Special Requests LEFT NEPHROSTOMY  Final   Gram Stain   Final    WBC PRESENT, PREDOMINANTLY PMN GRAM NEGATIVE RODS CYTOSPIN SMEAR Performed at [facility], [address] [place], [place] [address]    Culture FEW KLEBSIELLA PNEUMONIAE  Final   Report Status 08/28/2016 FINAL  Final   Organism ID, Bacteria KLEBSIELLA PNEUMONIAE  Final      Susceptibility   Klebsiella pneumoniae - MIC*    AMPICILLIN >=32 RESISTANT Resistant     CEFAZOLIN <=4 SENSITIVE Sensitive     CEFEPIME <=1 SENSITIVE Sensitive     CEFTAZIDIME <=1 SENSITIVE Sensitive     CEFTRIAXONE <=1 SENSITIVE Sensitive     CIPROFLOXACIN <=0.25 SENSITIVE Sensitive     GENTAMICIN <=1 SENSITIVE Sensitive     IMIPENEM <=0.25 SENSITIVE Sensitive     TRIMETH/SULFA <=20 SENSITIVE Sensitive     AMPICILLIN/SULBACTAM 8 SENSITIVE Sensitive     PIP/TAZO <=4 SENSITIVE Sensitive     Extended ESBL NEGATIVE Sensitive     * FEW KLEBSIELLA PNEUMONIAE  MRSA PCR Screening     Status: None   Collection Time: 08/24/16  3:44 PM  Result Value Ref Range Status   MRSA by PCR NEGATIVE NEGATIVE Final    Comment:        The GeneXpert MRSA Assay (FDA approved for  NASAL specimens only), is one component of a comprehensive MRSA colonization surveillance program. It is not intended to diagnose MRSA infection nor to guide or monitor treatment for MRSA infections.   Culture, respiratory (NON-Expectorated)     Status: None   Collection Time: 08/25/16  7:00 AM  Result Value Ref Range Status   Specimen Description TRACHEAL ASPIRATE  Final   Special Requests NONE  Final   Gram Stain   Final    FEW WBC PRESENT, PREDOMINANTLY PMN FEW SQUAMOUS EPITHELIAL CELLS  PRESENT NO ORGANISMS SEEN    Culture   Final    Consistent with normal respiratory flora. Performed at [facility], [address] [place], [place] [address]    Report Status 08/27/2016 FINAL  Final  C difficile quick scan w PCR reflex     Status: None   Collection Time: 08/26/16  8:05 PM  Result Value Ref Range Status   C Diff antigen NEGATIVE NEGATIVE Final   C Diff toxin NEGATIVE NEGATIVE Final   C Diff interpretation No C. difficile detected.  Final    Coagulation Studies:  Recent Labs  08/28/16 0441 08/29/16 0345  LABPROT 15.7* 15.9*  INR 1.24 1.26    Urinalysis: No results for input(s): COLORURINE, LABSPEC, PHURINE, GLUCOSEU, HGBUR, BILIRUBINUR, KETONESUR, PROTEINUR, UROBILINOGEN, NITRITE, LEUKOCYTESUR in the last 72 hours.  Invalid input(s): APPERANCEUR    Imaging: Dg Abd 1 View  Result Date: 08/28/2016 CLINICAL DATA:  Decreased bowel sounds, left nephrostomy to a EXAM: ABDOMEN - 1 VIEW COMPARISON:  Abdomen films of 08/25/2016 FINDINGS: Left nephrostomy tube is again noted. NG tube is present with the tip in the region of the antrum - duodenal bulb. The bowel gas pattern is nonspecific. Cardiomegaly is again noted. IMPRESSION: 1. NG tube tip overlies the distal antrum -duodenal bulb region. 2. No bowel obstruction. 3. Left nephrostomy tube. Electronically Signed   By: Dwyane Dee M.D.   On: 08/28/2016 17:05   US Renal  Result Date: 08/28/2016 CLINICAL DATA:  Intubated patient. 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 Clinical concern of perinephric abscess. EXAM: RENAL / URINARY TRACT ULTRASOUND COMPLETE COMPARISON:  Noncontrast abdominopelvic CT scan of August 24, 2016. FINDINGS: Right Kidney: Length: 13 cm. Echogenicity is approximately equal to that of the liver. No mass or hydronephrosis visualized. No abnormal perinephric fluid collections are observed. Left Kidney: Length: 12.5 cm. There is an upper pole cyst measuring 1.6  cm in greatest dimension. There is no hydronephrosis. The renal cortical echotexture is similar to that on the right. No abnormal perinephric fluid collections are observed. Bladder: Decompressed with a Foley catheter. IMPRESSION: Mildly increased renal cortical echotexture suggests medical renal disease. No perinephric abscess is observed. There is no hydronephrosis. No perinephric abscess is observed. If the patient's symptoms warrant further imaging, CT scanning would be the most useful next imaging step. Electronically Signed   By: David  [place] M.D.   On: 08/28/2016 11:40   Dg Chest Port 1 View  Result Date: 08/28/2016 CLINICAL DATA:  Respiratory failure, septic shock, acute renal failure, renal stone disease. Diabetes EXAM: PORTABLE CHEST 1 VIEW COMPARISON:  Portable chest x-ray of August 27, 2016 FINDINGS: The lungs are reasonably well inflated. The interstitial markings remain increased with confluent densities at the bases obscuring the hemidiaphragms. The cardiac silhouette is enlarged. The pulmonary vascularity is indistinct. The endotracheal tube tip lies approximately 3.1 cm above the carina. The esophagogastric tube tip projects below the inferior margin of  the image. The right internal jugular venous catheter tip projects over the midportion of the SVC. IMPRESSION: Bibasilar atelectasis or pneumonia with small bilateral pleural effusions. Mild CHF. The support tubes are in reasonable position. Electronically Signed   By: David  [place] M.D.   On: 08/28/2016 07:05     Medications:   . meropenem (MERREM) IV Stopped (08/29/16 1005)  . pureflow 3 each (08/29/16 0400)   . chlorhexidine  15 mL Mouth Rinse BID  . hydrocortisone sod succinate (SOLU-CORTEF) inj  25 mg Intravenous Q8H  . insulin aspart  0-20 Units Subcutaneous Q4H  . insulin glargine  10 Units Subcutaneous BID  . mouth rinse  15 mL Mouth Rinse q12n4p  . pantoprazole sodium  40 mg Per Tube Q1200  . sodium chloride flush  10-40 mL  Intracatheter Q12H   acetaminophen **OR** [DISCONTINUED] acetaminophen, albuterol, bisacodyl, fentaNYL (SUBLIMAZE) injection, heparin, midazolam, ondansetron **OR** ondansetron (ZOFRAN) IV, senna-docusate, sodium chloride flush  Assessment/ Plan:  Ms. FLORA PARKS is a 66 y.o. black female Ms. MAKAYA JUNEAU is a 66 y.o. black female with diabetes mellitus type II, hypertension, history of CVA, who was admitted to [facility] on 08/24/2016 for septic shock  1. Acute renal failure with metabolic acidosis: baseline creatinine of 0.7 on 06/12/2016.  Anuric urine output. Off vasopressors.  Acute renal failure secondary obstructive uropathy, sepsis, hypotension and ATN.  - Continue CVVHD. May transition to intermittent hemodialysis if no improving in renal function.   2. Septic Shock: Klebsiella.  Afebrile. Leukocytosis (38.8)34 (31.3) (39.4).  Klebsiella Off vasopresors   - empiric vancomycin and meropenum.  - hydrocortisone  3. Anemia and thrombocytopenia with renal failure: concerning for DIC    LOS: 5 Paxton Kanaan 7/6/201811:12 AM

## 2016-08-29 NOTE — Progress Notes (Signed)
[facility] [place] [facility]   PATIENT NAME: Megan Nixon    MR#:  [id number]  DATE OF BIRTH:  1950-08-18  SUBJECTIVE:   Extubated this morning Patient now off Pressors. CRRT tolerating it well Anuric Husband at bedside Answers basic questions well. Denies any pain.  REVIEW OF SYSTEMS:    Review of Systems  Constitutional: Negative for chills, fever and weight loss.  HENT: Negative for ear discharge, ear pain and nosebleeds.   Eyes: Negative for blurred vision, pain and discharge.  Respiratory: Negative for sputum production, shortness of breath, wheezing and stridor.   Cardiovascular: Negative for chest pain, palpitations, orthopnea and PND.  Gastrointestinal: Negative for abdominal pain, diarrhea, nausea and vomiting.  Genitourinary: Negative for frequency and urgency.  Musculoskeletal: Negative for back pain and joint pain.  Neurological: Positive for weakness. Negative for sensory change, speech change and focal weakness.  Psychiatric/Behavioral: Negative for depression and hallucinations. The patient is not nervous/anxious.    DRUG ALLERGIES:  No Known Allergies  VITALS:  Blood pressure (!) 146/78, pulse 87, temperature 97.7 F (36.5 C), temperature source Oral, resp. rate 13, height 5\' 8"  (1.727 m), weight 99.7 kg (219 lb 12.8 oz), SpO2 96 %.  PHYSICAL EXAMINATION:   Physical Exam  GENERAL:  66 y.o.-year-old patient lying in the bed. Looks critically ill, Limited exam.Extubated EYES: Pupils equal, round, reactive to light HEENT: Head atraumatic, normocephalic. Oropharynx and nasopharynx clear. ET tube in place NECK:  Supple, no jugular venous distention. No thyroid enlargement, no tenderness.  LUNGS: Bilateral decreased breath sounds bases. No use of accessory muscles. CARDIOVASCULAR: S1, S2 normal. Tachycardia, no murmur ABDOMEN: Soft, nontender, nondistended. Bowel sounds present. No organomegaly or mass. Foley catheter in  place EXTREMITIES: Patient does have cyanosis/ischemia bilateral lower extremity left more than right 3+ pitting edema both lower extremities    Cerebrovascular system moves all extremities well. Generalized deconditioning. Nonfocal grossly.;   LABORATORY PANEL:   CBC  Recent Labs Lab 08/29/16 0345  WBC 38.8*  HGB 8.8*  HCT 27.2*  PLT 27*   ------------------------------------------------------------------------------------------------------------------ Chemistries   Recent Labs Lab 08/28/16 0420  08/29/16 1013  NA 140  < > 139  K 3.8  < > 4.0  CL 107  < > 105  CO2 27  < > 28  GLUCOSE 213*  < > 176*  BUN 52*  < > 51*  CREATININE 2.21*  < > 1.85*  CALCIUM 7.6*  < > 8.4*  MG  --   < > 1.8  AST 111*  --   --   ALT 57*  --   --   ALKPHOS 130*  --   --   BILITOT 0.6  --   --   < > = values in this interval not displayed. ------------------------------------------------------------------------------------------------------------------  Cardiac Enzymes  Recent Labs Lab 08/28/16 0420  TROPONINI 0.72*   ------------------------------------------------------------------------------------------------------------------  RADIOLOGY:  Dg Abd 1 View  Result Date: 08/28/2016 CLINICAL DATA:  Decreased bowel sounds, left nephrostomy to a EXAM: ABDOMEN - 1 VIEW COMPARISON:  Abdomen films of 08/25/2016 FINDINGS: Left nephrostomy tube is again noted. NG tube is present with the tip in the region of the antrum - duodenal bulb. The bowel gas pattern is nonspecific. Cardiomegaly is again noted. IMPRESSION: 1. NG tube tip overlies the distal antrum -duodenal bulb region. 2. No bowel obstruction. 3. Left nephrostomy tube. Electronically Signed   By: Dwyane Dee M.D.   On: 08/28/2016 17:05   US Renal  Result  Date: 08/28/2016 CLINICAL DATA:  Intubated patient. 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 Clinical concern of perinephric abscess. EXAM: RENAL  / URINARY TRACT ULTRASOUND COMPLETE COMPARISON:  Noncontrast abdominopelvic CT scan of August 24, 2016. FINDINGS: Right Kidney: Length: 13 cm. Echogenicity is approximately equal to that of the liver. No mass or hydronephrosis visualized. No abnormal perinephric fluid collections are observed. Left Kidney: Length: 12.5 cm. There is an upper pole cyst measuring 1.6 cm in greatest dimension. There is no hydronephrosis. The renal cortical echotexture is similar to that on the right. No abnormal perinephric fluid collections are observed. Bladder: Decompressed with a Foley catheter. IMPRESSION: Mildly increased renal cortical echotexture suggests medical renal disease. No perinephric abscess is observed. There is no hydronephrosis. No perinephric abscess is observed. If the patient's symptoms warrant further imaging, CT scanning would be the most useful next imaging step. Electronically Signed   By: David  [place]azil[place]Jordan M.D.   On: 08/28/2016 11:40   Dg Chest Port 1 View  Result Date: 08/28/2016 CLINICAL DATA:  Respiratory failure, septic shock, acute renal failure, renal stone disease. Diabetes EXAM: PORTABLE CHEST 1 VIEW COMPARISON:  Portable chest x-ray of August 27, 2016 FINDINGS: The lungs are reasonably well inflated. The interstitial markings remain increased with confluent densities at the bases obscuring the hemidiaphragms. The cardiac silhouette is enlarged. The pulmonary vascularity is indistinct. The endotracheal tube tip lies approximately 3.1 cm above the carina. The esophagogastric tube tip projects below the inferior margin of the image. The right internal jugular venous catheter tip projects over the midportion of the SVC. IMPRESSION: Bibasilar atelectasis or pneumonia with small bilateral pleural effusions. Mild CHF. The support tubes are in reasonable position. Electronically Signed   By: David  [place]andJordan M.D.   On: 08/28/2016 07:05     ASSESSMENT AND PLAN:   * Septic shockDue to Klebsiella bacteremia source  urine Patient now extubated -Off IV pressors -Continue IV fluids -White count up to 38,000. Spoke with Dr. Lonn Georgiaonforti. CT abdomen tomorrow if white count not better. -On IV Solu-Cortef stress dose -IV meropenem -Received 1 dose of vancomycin today  * Left pyelonephritis with UPJ calculus and hydro-nephrosis -urine culture positive for Klebsiella -Patient was on IV Rocephin----now on IV meropenem -Consider ID consultation. -CT abdomen to rule out any fluid collection/abscess  * Acute kidney injury/pain unit secondary to septic shock ATN -On CRRT  * ARDS with Acute hypoxic respiratory failure, ventilator dependent  *Severe DIC due to #1  * Elevated troponin due to demand ischemia  *Bilateral lower extremity ischemia more on the left than right in the setting of sepsis/DIC  -Vascular to see patient   Patient is critically ill With high risk for cardiac arrest and death  All the records are reviewed and case discussed with Care Management/Social Workerr. Management plans discussed with the patient, family and they are in agreement.  CODE STATUS: FULL CODE  DVT Prophylaxis: SCDs  TOTAL TIME TAKING CARE OF THIS PATIENT: 25 minutes.    Ladasha Schnackenberg M.D on 08/29/2016 at 11:48 AM  Between 7am to 6pm - Pager - [phone]  After 6pm go to [email or web address] - password [email or web address]  [facility]  Office  [phone]  CC: Primary care physician; Danella PentonMiller, Mark F, MD  Note: This dictation was prepared with Dragon dictation along with smaller phrase technology. Any transcriptional errors that result from this process are unintentional.

## 2016-08-29 NOTE — Progress Notes (Addendum)
Writer called to patient's room approx 1330 by RT.  He appeared to have accidentally unplugged CRRT machine while taking ventilator from room.  Machine was down with blank screen.  Waste line had been pulled from under sink waste pipe and was placed in the sink.  Writer attempting to recover treatment at this time.  Will DC this cartridge and start with a new one  if this one is not recoverable.

## 2016-08-29 NOTE — Progress Notes (Signed)
Patient's CBG and safeset draw correlate w/i 5 points of each other and also correlate closely with serum glucose.  Continuing to use left fingers for CBGs at this time.

## 2016-08-29 NOTE — Progress Notes (Signed)
NP Annabelle Harmanana notified of continuing hypertension.  She ordered PRN IV Hydralazine

## 2016-08-29 NOTE — Progress Notes (Signed)
CRRT restarted at 1430

## 2016-08-29 NOTE — Progress Notes (Signed)
Nutrition Follow-up  DOCUMENTATION CODES:   Obesity unspecified  INTERVENTION:  1. Monitor for PO intake, diet advancement, provide ONS as needed  NUTRITION DIAGNOSIS:   Inadequate oral intake related to inability to eat (patient ventilated ) as evidenced by NPO status. -resolved  GOAL:   Patient will meet greater than or equal to 90% of their needs -New goal  MONITOR:   PO intake, Labs, I & O's, Weight trends, Skin, Diet advancement  ASSESSMENT:   66 y.o. black female with diabetes mellitus type II, hypertension, history of CVA, who was [facility] to [facility] 08/24/2016 for hydronephrosis, acute renal failure with metabolic acidosis and septic shock secondary to left kidney stone. Pt intubated r/t acute respiratory failure and now s/p nephrostomy tube placement 7/1.   Extubated this morning, now resting comfortably, awake, alert, on clear liquids. BL LE ischemia due to DIC/Sepsis, ARDS Remains on CRRT, may start HD tomorrow, AKI secondary to obstructive uropathy, Sepsis, hypotension, and ATN.  Intake/Output Summary (Last 24 hours) at 08/29/16 1222 Last data filed at 08/29/16 1200  Gross per 24 hour  Intake           1775.2 ml  Output             2937 ml  Net          -1161.8 ml  Labs and medications reviewed: BUN/Creatinine 51/1.85 Protonix  Diet Order:  Diet clear liquid Room service appropriate? Yes; Fluid consistency: Thin  Skin:  Reviewed, no issues  Last BM:  none since admit   Height:   Ht Readings from Last 1 Encounters:  08/24/16 5\' 8"  (1.727 m)    Weight:   Wt Readings from Last 1 Encounters:  08/29/16 219 lb 12.8 oz (99.7 kg)    Ideal Body Weight:  63.6 kg  BMI:  Body mass index is 33.42 kg/m.  Estimated Nutritional Needs:   Kcal:  1900-2400 (ABW x20-25)  Protein:  135-180 grams (1.5-2g/kg on CRRT)  Fluid:  Per MD/Nephro  EDUCATION NEEDS:   Education needs no appropriate at this time  Dionne AnoWilliam M. Deontez Klinke, MS, RD LDN Inpatient Clinical  Dietitian Pager [phone]

## 2016-08-30 ENCOUNTER — Inpatient Hospital Stay: Payer: Medicare PPO

## 2016-08-30 DIAGNOSIS — N189 Chronic kidney disease, unspecified: Secondary | ICD-10-CM

## 2016-08-30 DIAGNOSIS — N132 Hydronephrosis with renal and ureteral calculous obstruction: Secondary | ICD-10-CM

## 2016-08-30 DIAGNOSIS — A419 Sepsis, unspecified organism: Secondary | ICD-10-CM

## 2016-08-30 DIAGNOSIS — D65 Disseminated intravascular coagulation [defibrination syndrome]: Secondary | ICD-10-CM

## 2016-08-30 LAB — CBC WITH DIFFERENTIAL/PLATELET
Basophils Absolute: 0 10*3/uL (ref 0–0.1)
Basophils Relative: 0 %
EOS ABS: 0 10*3/uL (ref 0–0.7)
EOS PCT: 0 %
HEMATOCRIT: 26.6 % — AB (ref 35.0–47.0)
HEMOGLOBIN: 8.6 g/dL — AB (ref 12.0–16.0)
LYMPHS PCT: 6 %
Lymphs Abs: 3.1 10*3/uL (ref 1.0–3.6)
MCH: 27.9 pg (ref 26.0–34.0)
MCHC: 32.3 g/dL (ref 32.0–36.0)
MCV: 86.3 fL (ref 80.0–100.0)
MONO ABS: 3.6 10*3/uL — AB (ref 0.2–0.9)
Monocytes Relative: 7 %
NEUTROS PCT: 87 %
Neutro Abs: 44.9 10*3/uL — ABNORMAL HIGH (ref 1.4–6.5)
Platelets: 68 10*3/uL — ABNORMAL LOW (ref 150–440)
RBC: 3.08 MIL/uL — AB (ref 3.80–5.20)
RDW: 17.4 % — ABNORMAL HIGH (ref 11.5–14.5)
WBC: 51.6 10*3/uL — AB (ref 3.6–11.0)

## 2016-08-30 LAB — GLUCOSE, CAPILLARY
GLUCOSE-CAPILLARY: 164 mg/dL — AB (ref 65–99)
GLUCOSE-CAPILLARY: 130 mg/dL — AB (ref 65–99)
Glucose-Capillary: 128 mg/dL — ABNORMAL HIGH (ref 65–99)
Glucose-Capillary: 158 mg/dL — ABNORMAL HIGH (ref 65–99)
Glucose-Capillary: 124 mg/dL — ABNORMAL HIGH (ref 65–99)

## 2016-08-30 LAB — RENAL FUNCTION PANEL
ALBUMIN: 2.3 g/dL — AB (ref 3.5–5.0)
ANION GAP: 6 (ref 5–15)
BUN: 39 mg/dL — ABNORMAL HIGH (ref 6–20)
CO2: 27 mmol/L (ref 22–32)
Calcium: 8.5 mg/dL — ABNORMAL LOW (ref 8.9–10.3)
Chloride: 106 mmol/L (ref 101–111)
Creatinine, Ser: 1.54 mg/dL — ABNORMAL HIGH (ref 0.44–1.00)
GFR, EST AFRICAN AMERICAN: 39 mL/min — AB (ref >60)
GFR, EST NON AFRICAN AMERICAN: 34 mL/min — AB (ref >60)
GLUCOSE: 139 mg/dL — AB (ref 65–99)
PHOSPHORUS: 3 mg/dL (ref 2.5–4.6)
POTASSIUM: 4.5 mmol/L (ref 3.5–5.1)
Sodium: 139 mmol/L (ref 135–145)

## 2016-08-30 LAB — TYPE AND SCREEN
ABO/RH(D): O POS
ANTIBODY SCREEN: NEGATIVE

## 2016-08-30 LAB — PROTIME-INR
INR: 1.44
PROTHROMBIN TIME: 17.7 s — AB (ref 11.4–15.2)

## 2016-08-30 LAB — APTT: APTT: 56 s — AB (ref 24–36)

## 2016-08-30 LAB — PROCALCITONIN: PROCALCITONIN: 37.93 ng/mL

## 2016-08-30 LAB — MAGNESIUM: Magnesium: 1.7 mg/dL (ref 1.7–2.4)

## 2016-08-30 MED ORDER — SODIUM CHLORIDE 0.9 % IV SOLN
Freq: Once | INTRAVENOUS | Status: AC
  Administered 2016-08-30: 06:00:00 via INTRAVENOUS

## 2016-08-30 MED ORDER — FUROSEMIDE 10 MG/ML IJ SOLN
40.0000 mg | Freq: Once | INTRAMUSCULAR | Status: AC
  Administered 2016-08-30: 40 mg via INTRAVENOUS
  Filled 2016-08-30: qty 4

## 2016-08-30 MED ORDER — HEPARIN SODIUM (PORCINE) 1000 UNIT/ML DIALYSIS
1000.0000 [IU] | Freq: Once | INTRAMUSCULAR | Status: AC
  Administered 2016-08-30: 1000 [IU] via INTRAVENOUS_CENTRAL
  Filled 2016-08-30 (×2): qty 6

## 2016-08-30 MED ORDER — SODIUM CHLORIDE 0.9 % IV BOLUS (SEPSIS)
500.0000 mL | Freq: Once | INTRAVENOUS | Status: AC
  Administered 2016-08-30: 500 mL via INTRAVENOUS

## 2016-08-30 NOTE — Progress Notes (Signed)
[facility]  Daily Progress Note   Subjective  -  Patient is more alert today she remains in the intensive care unit she is in bed upon my arrival. She denies foot or hand pain  Objective Vitals:   08/30/16 1000 08/30/16 1100 08/30/16 1200 08/30/16 1300  BP: (!) 143/84 (!) 152/80 (!) 134/92 138/76  Pulse: 92 (!) 103 (!) 101 96  Resp: 19 19 18 19   Temp:  97.7 F (36.5 C) 98.7 F (37.1 C) 97.6 F (36.4 C)  TempSrc:  Oral Oral Oral  SpO2: 97% 96% 96% 96%  Weight:      Height:        Intake/Output Summary (Last 24 hours) at 08/30/16 1354 Last data filed at 08/30/16 1250  Gross per 24 hour  Intake           1302.5 ml  Output             2902 ml  Net          -1599.5 ml    PULM  Normal effort , no use of accessory muscles CV  No JVD, RRR Abd      No distended, nontender VASC  Both the right hand and the right foot have patchy areas of discoloration but they appear less intense today clearly they are improving. The left great toe has an area of fixed necrosis on the tip and the left third toe from the distal phalangeal to the tip appears to be fixed. Other areas of the left foot appear improved left hand has only minor patchy areas of involvement. Pedal pulses remained strongly pulsatile as are the radial pulses  Laboratory CBC    Component Value Date/Time   WBC 51.6 (HH) 08/30/2016 1201   HGB 8.6 (L) 08/30/2016 1201   HCT 26.6 (L) 08/30/2016 1201   PLT 68 (L) 08/30/2016 1201    BMET    Component Value Date/Time   NA 139 08/30/2016 0343   K 4.5 08/30/2016 0343   CL 106 08/30/2016 0343   CO2 27 08/30/2016 0343   GLUCOSE 139 (H) 08/30/2016 0343   BUN 39 (H) 08/30/2016 0343   CREATININE 1.54 (H) 08/30/2016 0343   CALCIUM 8.5 (L) 08/30/2016 0343   GFRNONAA 34 (L) 08/30/2016 0343   GFRAA 39 (L) 08/30/2016 0343    Assessment/Planning:  1.  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 Of note in my original H&P are reported the right third toe it is the left third toe that is involved. At this time she continues to improve systemically and there is no urgent need for surgical intervention  2.  DIC with thrombocytopenia:  Continue supportive care monitor platelet count which is significantly up compared to yesterday and is now in the mid 60s  3.  Acute renal failure:   holding CRRT as of now. We will continue to monitor BUN and creatinine avoid nephrotoxic drugs and dehydration as well as hypotension  4.  Diabetes mellitus:  Continue sliding scale  5. Klebsiella sepsis: Continue antibiotics as prescribed  Levora DredgeGregory Rashae Rother  08/30/2016, 1:54 PM

## 2016-08-30 NOTE — Progress Notes (Signed)
Pt remains alert and oriented this shift, no complaints of pain.  Sinus tachycardia on telemetry, lungs diminished upon auscultation, requiring 2L [place].  Pt has very appetite, only interested a few bites of food and sips of water.  Foley removed at 1515 per Urology order after arriving back from CT.  Pt has yet to void post foley removal, continue to monitor and encourage voiding.  CRRT discontinued at 1035 this morning per Dr. Alice RiegerKolluru's orders. CT Abdomen and pelvis show additional stone, Dr. Annabell HowellsWrenn with Urology notified.  Pt received one unit of FFP and one unit of Platelets this morning, tolerated well.  Platelets now up to 68.  Vital signs stable, afebrile.

## 2016-08-30 NOTE — Progress Notes (Signed)
[facility]  ROUNDING NOTE   Subjective:   Off vasopressors.  Answering questions appropriately. States she has no pain. No complaints.   Wbc 47.8  CRRT - tolerating treatment well. Net -5254 UOP 506  Objective:  Vital signs in last 24 hours:  Temp:  [97.4 F (36.3 C)-98.7 F (37.1 C)] 97.6 F (36.4 C) (07/07 0908) Pulse Rate:  [72-105] 94 (07/07 0908) Resp:  [11-20] 17 (07/07 0908) BP: (120-164)/(55-95) 139/75 (07/07 0908) SpO2:  [90 %-97 %] 97 % (07/07 0908) Weight:  [97.5 kg (214 lb 15.2 oz)] 97.5 kg (214 lb 15.2 oz) (07/07 0400)  Weight change: -2.2 kg (-4 lb 13.6 oz) Filed Weights   08/28/16 0327 08/29/16 0419 08/30/16 0400  Weight: 100.3 kg (221 lb 1.9 oz) 99.7 kg (219 lb 12.8 oz) 97.5 kg (214 lb 15.2 oz)    Intake/Output: I/O last 3 completed shifts: In: 1524.5 [P.O.:25; I.V.:194.5; Other:155; NG/GT:700; IV Piggyback:450] Out: 4942 [Urine:796; ZOXWR:6045Other:3596; Stool:550]   Intake/Output this shift:  Total I/O In: 358.3 [Blood:358.3] Out: -   Physical Exam: General: Ill appearing  Head: [place]/AT  Eyes: Anicteric, PERRL  Neck:  trachea midline  Lungs:  Clear bilaterally   Heart: tachycardia  Abdomen:  Soft, nontender, obese  Extremities:  +cyanosis  Neurologic: Intubated, sedated  Skin: No lesions  Access: Left femoral temp HD catheter 7/2 Dr. Sung AmabileSimonds    Basic Metabolic Panel:  Recent Labs Lab 08/29/16 0345 08/29/16 1013 08/29/16 1607 08/29/16 2246 08/30/16 0343  NA 138 139 138 139 139  K 4.0 4.0 4.2 3.9 4.5  CL 104 105 105 107 106  CO2 28 28 27 27 27   GLUCOSE 184* 176* 155* 115* 139*  BUN 53* 51* 46* 41* 39*  CREATININE 2.08* 1.85* 1.89* 1.69* 1.54*  CALCIUM 8.1* 8.4* 8.3* 8.2* 8.5*  MG 1.8 1.8 1.8 1.7 1.7  PHOS 2.7  2.7 2.8 2.9 2.6 3.0    Liver Function Tests:  Recent Labs Lab 08/24/16 0919 08/24/16 2115 08/25/16 0508  08/27/16 0505  08/28/16 0420  08/29/16 0345 08/29/16 1013 08/29/16 1607 08/29/16 2246  08/30/16 0343  AST 83* 94* 139*  --  69*  --  111*  --   --   --   --   --   --   ALT 44 43 48  --  55*  --  57*  --   --   --   --   --   --   ALKPHOS 78 79 60  --  98  --  130*  --   --   --   --   --   --   BILITOT 0.9 1.0 0.9  --  0.7  --  0.6  --   --   --   --   --   --   PROT 6.7 5.8* 4.8*  --  5.5*  --  5.3*  --   --   --   --   --   --   ALBUMIN 3.2* 2.7* 2.3*  < > 2.2*  < > 2.0*  < > 2.0* 2.3* 2.2* 2.1* 2.3*  < > = values in this interval not displayed. No results for input(s): LIPASE, AMYLASE in the last 168 hours. No results for input(s): AMMONIA in the last 168 hours.  CBC:  Recent Labs Lab 08/25/16 0508 08/26/16 0532 08/27/16 0620 08/28/16 0420 08/29/16 0345 08/29/16 2000  WBC 32.2* 39.4* 31.3* 34.0* 38.8* 47.8*  NEUTROABS 27.1* 35.0* 28.2*  30.7* 31.4*  --   HGB 11.3* 10.6* 9.9* 9.8* 8.8* 9.3*  HCT 32.9* 32.0* 30.1* 29.8* 27.2* 29.2*  MCV 88.6 87.3 87.2 88.6 87.5 86.0  PLT 32* 18* 18* 17* 27* 33*    Cardiac Enzymes:  Recent Labs Lab 08/24/16 1544 08/24/16 1600 08/24/16 2116 08/25/16 0508 08/28/16 0420  TROPONINI 0.13* 0.13* 0.77* 1.25* 0.72*    BNP: Invalid input(s): POCBNP  CBG:  Recent Labs Lab 08/29/16 2004 08/29/16 2151 08/29/16 2349 08/30/16 0411 08/30/16 0729  GLUCAP 138* 111* 99 128* 158*    Microbiology: Results for orders placed or performed during the hospital encounter of 08/24/16  Blood Culture (routine x 2)     Status: Abnormal   Collection Time: 08/24/16  8:25 AM  Result Value Ref Range Status   Specimen Description BLOOD RIGHT ANTECUBITAL  Final   Special Requests   Final    BOTTLES DRAWN AEROBIC AND ANAEROBIC Blood Culture adequate volume   Culture  Setup Time   Final    GRAM NEGATIVE RODS IN BOTH AEROBIC AND ANAEROBIC BOTTLES CRITICAL RESULT CALLED TO, READ BACK BY AND VERIFIED WITH: JASON ROBBINS ON 08/24/16 AT 1933 QSD    Culture (A)  Final    KLEBSIELLA PNEUMONIAE SUSCEPTIBILITIES PERFORMED ON PREVIOUS CULTURE  WITHIN THE LAST 5 DAYS. Performed at [facility], [address] [place], [place] [address]    Report Status 08/27/2016 FINAL  Final  Blood Culture (routine x 2)     Status: Abnormal   Collection Time: 08/24/16  8:25 AM  Result Value Ref Range Status   Specimen Description BLOOD RIGHT ANTECUBITAL  Final   Special Requests   Final    BOTTLES DRAWN AEROBIC AND ANAEROBIC Blood Culture adequate volume   Culture  Setup Time   Final    GRAM NEGATIVE RODS IN BOTH AEROBIC AND ANAEROBIC BOTTLES CRITICAL RESULT CALLED TO, READ BACK BY AND VERIFIED WITH: JASON ROBBINS ON 08/24/16 AT 1933 QSD    Culture KLEBSIELLA PNEUMONIAE (A)  Final   Report Status 08/27/2016 FINAL  Final   Organism ID, Bacteria KLEBSIELLA PNEUMONIAE  Final      Susceptibility   Klebsiella pneumoniae - MIC*    AMPICILLIN >=32 RESISTANT Resistant     CEFAZOLIN <=4 SENSITIVE Sensitive     CEFEPIME <=1 SENSITIVE Sensitive     CEFTAZIDIME <=1 SENSITIVE Sensitive     CEFTRIAXONE <=1 SENSITIVE Sensitive     CIPROFLOXACIN <=0.25 SENSITIVE Sensitive     GENTAMICIN <=1 SENSITIVE Sensitive     IMIPENEM <=0.25 SENSITIVE Sensitive     TRIMETH/SULFA <=20 SENSITIVE Sensitive     AMPICILLIN/SULBACTAM 8 SENSITIVE Sensitive     PIP/TAZO <=4 SENSITIVE Sensitive     Extended ESBL NEGATIVE Sensitive     * KLEBSIELLA PNEUMONIAE  Blood Culture ID Panel (Reflexed)     Status: Abnormal   Collection Time: 08/24/16  8:25 AM  Result Value Ref Range Status   Enterococcus species NOT DETECTED NOT DETECTED Final   Listeria monocytogenes NOT DETECTED NOT DETECTED Final   Staphylococcus species NOT DETECTED NOT DETECTED Final   Staphylococcus aureus NOT DETECTED NOT DETECTED Final   Streptococcus species NOT DETECTED NOT DETECTED Final   Streptococcus agalactiae NOT DETECTED NOT DETECTED Final   Streptococcus pneumoniae NOT DETECTED NOT DETECTED Final   Streptococcus pyogenes NOT DETECTED NOT DETECTED Final   Acinetobacter baumannii NOT  DETECTED NOT DETECTED Final   Enterobacteriaceae species DETECTED (A) NOT DETECTED Final    Comment: Enterobacteriaceae  represent a large family of gram-negative bacteria, not a single organism. CRITICAL RESULT CALLED TO, READ BACK BY AND VERIFIED WITH: JASON ROBBINS ON 08/24/16 AT 1933 QSD    Enterobacter cloacae complex NOT DETECTED NOT DETECTED Final   Escherichia coli NOT DETECTED NOT DETECTED Final   Klebsiella oxytoca NOT DETECTED NOT DETECTED Final   Klebsiella pneumoniae DETECTED (A) NOT DETECTED Final    Comment: CRITICAL RESULT CALLED TO, READ BACK BY AND VERIFIED WITH: JASON ROBBINS ON 08/24/16 AT 1933 QSD    Proteus species NOT DETECTED NOT DETECTED Final   Serratia marcescens NOT DETECTED NOT DETECTED Final   Carbapenem resistance NOT DETECTED NOT DETECTED Final   Haemophilus influenzae NOT DETECTED NOT DETECTED Final   Neisseria meningitidis NOT DETECTED NOT DETECTED Final   Pseudomonas aeruginosa NOT DETECTED NOT DETECTED Final   Candida albicans NOT DETECTED NOT DETECTED Final   Candida glabrata NOT DETECTED NOT DETECTED Final   Candida krusei NOT DETECTED NOT DETECTED Final   Candida parapsilosis NOT DETECTED NOT DETECTED Final   Candida tropicalis NOT DETECTED NOT DETECTED Final  Urine culture     Status: Abnormal   Collection Time: 08/24/16  9:19 AM  Result Value Ref Range Status   Specimen Description URINE, CATHETERIZED  Final   Special Requests NONE  Final   Culture >=100,000 COLONIES/mL KLEBSIELLA PNEUMONIAE (A)  Final   Report Status 08/26/2016 FINAL  Final   Organism ID, Bacteria KLEBSIELLA PNEUMONIAE (A)  Final      Susceptibility   Klebsiella pneumoniae - MIC*    AMPICILLIN >=32 RESISTANT Resistant     CEFAZOLIN <=4 SENSITIVE Sensitive     CEFTRIAXONE <=1 SENSITIVE Sensitive     CIPROFLOXACIN <=0.25 SENSITIVE Sensitive     GENTAMICIN <=1 SENSITIVE Sensitive     IMIPENEM <=0.25 SENSITIVE Sensitive     NITROFURANTOIN 64 INTERMEDIATE Intermediate      TRIMETH/SULFA <=20 SENSITIVE Sensitive     AMPICILLIN/SULBACTAM 4 SENSITIVE Sensitive     PIP/TAZO <=4 SENSITIVE Sensitive     Extended ESBL NEGATIVE Sensitive     * >=100,000 COLONIES/mL KLEBSIELLA PNEUMONIAE  Body fluid culture     Status: None   Collection Time: 08/24/16  3:30 PM  Result Value Ref Range Status   Specimen Description FLUID  Final   Special Requests LEFT NEPHROSTOMY  Final   Gram Stain   Final    WBC PRESENT, PREDOMINANTLY PMN GRAM NEGATIVE RODS CYTOSPIN SMEAR Performed at [facility], [address], [place], [place] [address]    Culture FEW KLEBSIELLA PNEUMONIAE  Final   Report Status 08/28/2016 FINAL  Final   Organism ID, Bacteria KLEBSIELLA PNEUMONIAE  Final      Susceptibility   Klebsiella pneumoniae - MIC*    AMPICILLIN >=32 RESISTANT Resistant     CEFAZOLIN <=4 SENSITIVE Sensitive     CEFEPIME <=1 SENSITIVE Sensitive     CEFTAZIDIME <=1 SENSITIVE Sensitive     CEFTRIAXONE <=1 SENSITIVE Sensitive     CIPROFLOXACIN <=0.25 SENSITIVE Sensitive     GENTAMICIN <=1 SENSITIVE Sensitive     IMIPENEM <=0.25 SENSITIVE Sensitive     TRIMETH/SULFA <=20 SENSITIVE Sensitive     AMPICILLIN/SULBACTAM 8 SENSITIVE Sensitive     PIP/TAZO <=4 SENSITIVE Sensitive     Extended ESBL NEGATIVE Sensitive     * FEW KLEBSIELLA PNEUMONIAE  MRSA PCR Screening     Status: None   Collection Time: 08/24/16  3:44 PM  Result Value Ref Range Status  MRSA by PCR NEGATIVE NEGATIVE Final    Comment:        The GeneXpert MRSA Assay (FDA approved for NASAL specimens only), is one component of a comprehensive MRSA colonization surveillance program. It is not intended to diagnose MRSA infection nor to guide or monitor treatment for MRSA infections.   Culture, respiratory (NON-Expectorated)     Status: None   Collection Time: 08/25/16  7:00 AM  Result Value Ref Range Status   Specimen Description TRACHEAL ASPIRATE  Final   Special Requests NONE  Final   Gram Stain   Final     FEW WBC PRESENT, PREDOMINANTLY PMN FEW SQUAMOUS EPITHELIAL CELLS PRESENT NO ORGANISMS SEEN    Culture   Final    Consistent with normal respiratory flora. Performed at [facility], [address] [place], [place] [address]    Report Status 08/27/2016 FINAL  Final  C difficile quick scan w PCR reflex     Status: None   Collection Time: 08/26/16  8:05 PM  Result Value Ref Range Status   C Diff antigen NEGATIVE NEGATIVE Final   C Diff toxin NEGATIVE NEGATIVE Final   C Diff interpretation No C. difficile detected.  Final    Coagulation Studies:  Recent Labs  08/28/16 0441 08/29/16 0345  LABPROT 15.7* 15.9*  INR 1.24 1.26    Urinalysis: No results for input(s): COLORURINE, LABSPEC, PHURINE, GLUCOSEU, HGBUR, BILIRUBINUR, KETONESUR, PROTEINUR, UROBILINOGEN, NITRITE, LEUKOCYTESUR in the last 72 hours.  Invalid input(s): APPERANCEUR    Imaging: Dg Abd 1 View  Result Date: 08/28/2016 CLINICAL DATA:  Decreased bowel sounds, left nephrostomy to a EXAM: ABDOMEN - 1 VIEW COMPARISON:  Abdomen films of 08/25/2016 FINDINGS: Left nephrostomy tube is again noted. NG tube is present with the tip in the region of the antrum - duodenal bulb. The bowel gas pattern is nonspecific. Cardiomegaly is again noted. IMPRESSION: 1. NG tube tip overlies the distal antrum -duodenal bulb region. 2. No bowel obstruction. 3. Left nephrostomy tube. Electronically Signed   By: Dwyane Dee M.D.   On: 08/28/2016 17:05   US Renal  Result Date: 08/28/2016 CLINICAL DATA:  Intubated patient. 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 Clinical concern of perinephric abscess. EXAM: RENAL / URINARY TRACT ULTRASOUND COMPLETE COMPARISON:  Noncontrast abdominopelvic CT scan of August 24, 2016. FINDINGS: Right Nixon: Length: 13 cm. Echogenicity is approximately equal to that of the liver. No mass or hydronephrosis visualized. No abnormal perinephric fluid collections are observed. Left  Nixon: Length: 12.5 cm. There is an upper pole cyst measuring 1.6 cm in greatest dimension. There is no hydronephrosis. The renal cortical echotexture is similar to that on the right. No abnormal perinephric fluid collections are observed. Bladder: Decompressed with a Foley catheter. IMPRESSION: Mildly increased renal cortical echotexture suggests medical renal disease. No perinephric abscess is observed. There is no hydronephrosis. No perinephric abscess is observed. If the patient's symptoms warrant further imaging, CT scanning would be the most useful next imaging step. Electronically Signed   By: David  [place] M.D.   On: 08/28/2016 11:40   Dg Chest Port 1 View  Result Date: 08/30/2016 CLINICAL DATA:  Initial evaluation for acute respiratory failure. EXAM: PORTABLE CHEST 1 VIEW COMPARISON:  Prior radiograph from 08/28/2016. FINDINGS: Interval removal of endotracheal and enteric tubes. Right IJ approach central venous catheter remains in place with tip overlying the cavoatrial junction. Stable cardiomegaly. Mediastinal silhouette unchanged. Lungs are hypoinflated. Persistent pulmonary vascular congestion without  frank pulmonary edema. Persistent left pleural effusion, with decreased right pleural effusion. Probable superimposed bibasilar atelectasis. No definite focal infiltrates. No pneumothorax. No acute osseus abnormality. IMPRESSION: 1. Interval removal of endotracheal and enteric tubes. Stable right IJ central venous catheter. 2. Stable cardiomegaly with mild CHF, slightly improved from previous. Persistent small bilateral pleural effusions. 3. Bibasilar atelectasis. Electronically Signed   By: Rise Mu M.D.   On: 08/30/2016 06:11     Medications:   . meropenem (MERREM) IV Stopped (08/29/16 2255)  . pureflow 3 each (08/30/16 0345)  . vancomycin     . chlorhexidine  15 mL Mouth Rinse BID  . hydrocortisone sod succinate (SOLU-CORTEF) inj  25 mg Intravenous Q8H  . insulin aspart  0-20  Units Subcutaneous Q4H  . insulin glargine  10 Units Subcutaneous BID  . mouth rinse  15 mL Mouth Rinse q12n4p  . pantoprazole sodium  40 mg Per Tube Q1200  . sodium chloride flush  10-40 mL Intracatheter Q12H   acetaminophen **OR** [DISCONTINUED] acetaminophen, albuterol, bisacodyl, fentaNYL (SUBLIMAZE) injection, heparin, hydrALAZINE, midazolam, ondansetron **OR** ondansetron (ZOFRAN) IV, senna-docusate, sodium chloride flush  Assessment/ Plan:  Megan Nixon is a 66 y.o. black female Megan Nixon is a 66 y.o. black female with diabetes mellitus type II, hypertension, history of CVA, who was admitted to [facility] on 08/24/2016 for septic shock  1. Acute renal failure with metabolic acidosis: baseline creatinine of 0.7 on 06/12/2016.  Anuric urine output. Off vasopressors.  Acute renal failure secondary obstructive uropathy, sepsis, hypotension and ATN.  - Discontinue CVVHD. May transition to intermittent hemodialysis if no improving in renal function.   2. Septic Shock: Klebsiella.  Afebrile. Leukocytosis  47.8 (38.8) (34) (31.3) (39.4).  Klebsiella Off vasopresors   - empiric vancomycin and meropenum.  - hydrocortisone - Appreciate ID and   3. Anemia and thrombocytopenia with renal failure: status post FFP transfusion on 7/6    LOS: 6 Adline Kirshenbaum 7/7/20189:18 AM

## 2016-08-30 NOTE — Progress Notes (Signed)
CRRT discontinued per Dr. Wynelle LinkKolluru orders

## 2016-08-30 NOTE — Progress Notes (Signed)
Patient alert. Friends and family in room visiting.  Poor appetite.  Will encourage food and fluids. No acute distress noted.

## 2016-08-30 NOTE — Progress Notes (Addendum)
PULMONARY / CRITICAL CARE MEDICINE   Name: Megan Nixon MRN: [id number] DOB: 1950/09/10    ADMISSION DATE:  08/24/2016  PT PROFILE:   66 y.o. F with hx of DM, hypertension admitted with severe sepsis/septic shock due to pyelonephritis, left hydronephrosis, klebsiella bacteremia  MAJOR EVENTS/TEST RESULTS: 07/01 admitted to ICU via emergency department as above 07/01 CT renal stone study: 5.5 x 5.0 mm left UPJ calculus with high-grade obstructive findings 07/01 CTA chest: No PE, patchy bilateral infiltrates 07/01 urology consultation Griffin Dakin): PCN placement recommended. Recommended 14 day course of antibiotics for complicated UTI. "Can plan definitive stone treatment once infection has been treated (between 2 and 6 weeks post PCN placement in most cases)"  07/01 left PCN tube placed by IR 07/02 echocardiogram: LVEF 55-60% 07/02 nephrology consultation - CRRT ordered 07/03 decreasing vasopressor requirements. Remains on CRRT. Severe DIC persists 07/04 Off vasopressin. Low dose norepinephrine. Awakens but not F/C. DIC persists 07/05 off of all vasopressors. More awake and responsive. Follows commands intermittently. 07/05 renal ultrasound (to rule out perinephric abscess):   INDWELLING DEVICES:: ETT 07/01 >>  R IJ CVL 07/01 >>  R radial art line 07/01 >> 07/05 L femoral HD cath 07/02 >>   MICRO DATA: MRSA PCR 07/01 >> NEG Urine 07/01 >> Klebsiella Resp 07/01 >> NOF Blood 07/01 >> 2/2 Klebsiella  ANTIMICROBIALS:  Vanc 07/01 >> 07/01 Pip-tazo 07/01 >> 07/02 Ceftriaxone 07/02 >> 07/05 Meropenem 07/05 >>    SUBJECTIVE:  Over the last 24 hours, patient has been successfully extubated, presently states she is breathing well on nasal cannula. She has had blood in her Foley catheter, being transfused fresh frozen plasma and platelets. Progressive increasing white count even after antibiotics broadened out, pending infectious disease consultation along with GU  consultation.  VITAL SIGNS: BP (!) 158/85   Pulse (!) 102   Temp (!) 97.5 F (36.4 C) (Oral)   Resp 19   Ht 5\' 8"  (1.727 m)   Wt 97.5 kg (214 lb 15.2 oz)   SpO2 93%   BMI 32.68 kg/m    INTAKE / OUTPUT: I/O last 3 completed shifts: In: 1524.5 [P.O.:25; I.V.:194.5; Other:155; NG/GT:700; IV Piggyback:450] Out: 4821 [Urine:796; WUJWJ:1914; Stool:550]  PHYSICAL EXAMINATION: General: Intubated, sedated, RASS 0 Neuro: PERRLA, EOMI, MAEs HEENT: NCAT, sclerae white. Mild scleral edema Cardiovascular: Regular, no murmurs Lungs: No wheezes Abdomen: Soft, diminished bowel sounds, no masses Ext: Feet and hands are warmer, mottling of all 4 extremities improved, there is some superficial skin necrosis on tip of her right thumb and several toes on the left foot.   LABS:  BMET  Recent Labs Lab 08/29/16 1607 08/29/16 2246 08/30/16 0343  NA 138 139 139  K 4.2 3.9 4.5  CL 105 107 106  CO2 27 27 27   BUN 46* 41* 39*  CREATININE 1.89* 1.69* 1.54*  GLUCOSE 155* 115* 139*    Electrolytes  Recent Labs Lab 08/29/16 1607 08/29/16 2246 08/30/16 0343  CALCIUM 8.3* 8.2* 8.5*  MG 1.8 1.7 1.7  PHOS 2.9 2.6 3.0    CBC  Recent Labs Lab 08/28/16 0420 08/29/16 0345 08/29/16 2000  WBC 34.0* 38.8* 47.8*  HGB 9.8* 8.8* 9.3*  HCT 29.8* 27.2* 29.2*  PLT 17* 27* 33*    Coag's  Recent Labs Lab 08/26/16 0532 08/27/16 0620 08/28/16 0441 08/29/16 0345  APTT 40* 33 33 33  INR 1.44  --  1.24 1.26    Sepsis Markers  Recent Labs Lab 08/24/16 2117 08/25/16 0515 08/25/16 0847  08/29/16 0345 08/29/16 2000 08/30/16 0343  LATICACIDVEN 14.0* 14.7* 15.2*  --   --   --   --   PROCALCITON  --   --   --   < > 69.77 44.49 37.93  < > = values in this interval not displayed.  ABG  Recent Labs Lab 08/25/16 0046 08/25/16 0508 08/29/16 0840  PHART 7.23* 7.35 7.39  PCO2ART 31* 35 49*  PO2ART 94 75* 69*    Liver Enzymes  Recent Labs Lab 08/25/16 0508  08/27/16 0505   08/28/16 0420  08/29/16 1607 08/29/16 2246 08/30/16 0343  AST 139*  --  69*  --  111*  --   --   --   --   ALT 48  --  55*  --  57*  --   --   --   --   ALKPHOS 60  --  98  --  130*  --   --   --   --   BILITOT 0.9  --  0.7  --  0.6  --   --   --   --   ALBUMIN 2.3*  < > 2.2*  < > 2.0*  < > 2.2* 2.1* 2.3*  < > = values in this interval not displayed.  Cardiac Enzymes  Recent Labs Lab 08/24/16 2116 08/25/16 0508 08/28/16 0420  TROPONINI 0.77* 1.25* 0.72*    Glucose  Recent Labs Lab 08/29/16 1551 08/29/16 2004 08/29/16 2151 08/29/16 2349 08/30/16 0411 08/30/16 0729  GLUCAP 151* 138* 111* 99 128* 158*    CXR:  No significant change, low volumes, probable right effusion and possible left effusion with patchy airspace disease   ASSESSMENT / PLAN:  PULMONARY A: Status post extubation, doing well on nasal cannula from a pulmonary perspective  CARDIOVASCULAR A:  Septic shock - Has been successfully weaned off of pressors Mildly elevated troponin I  RENAL A:   AKI, oligo-anuric Lactic acidosis, resolved P:   Monitor BMET intermittently Monitor I/Os Correct electrolytes as indicated Nephrology following. Continue CRRT   GASTROINTESTINAL A:   No issues P:   SUP: enteral PPI Continue TF protocol - initiated 07/02  HEMATOLOGIC A:  Patient presently receiving fresh frozen plasma 1 unit of platelets secondary to bleeding noted in her Foley catheter. Will transfuse, repeat CBC, PT/INR, pending GU input  INFECTIOUS A:   L  Pyelonephritis/hydronephrosis, status post nephrostomy tube, progressive increasing white count Klebsiella bacteremia Discussed with GU, regarding nephrostomy tube, will also consult infectious disease, at this point we'll continue vancomycin and meropenem. Will resend blood cultures, will wait for GU input regarding CT abdomen  ENDOCRINE A:   DM 2, controlled Relative adrenal insufficiency P:   Cont Lantus/SSI - initiated  07/03 Decrease hydrocortisone to 25 mg q 12  Vascular ischemia Appreciate vascular surgery consultation. He did not feel any acute intervention at this time, I do feel however that her toes may progress and require repeat dictation at some point.   CCM time: 35 mins The above time includes time spent in consultation with patient and/or family members and reviewing care plan on multidisciplinary rounds  Tora KindredJohn Aeryn Medici, DO PCCM service Mobile [phone] Pager [phone] 08/30/2016 8:15 AM

## 2016-08-30 NOTE — Progress Notes (Signed)
Pharmacy Consult for CRRT Medication Adjustment  No Known Allergies  Patient Measurements: Height: 5\' 8"  (172.7 cm) Weight: 214 lb 15.2 oz (97.5 kg) IBW/kg (Calculated) : 63.9  Vital Signs: Temp: 97.7 F (36.5 C) (07/07 1100) Temp Source: Oral (07/07 1100) BP: 152/80 (07/07 1100) Pulse Rate: 103 (07/07 1100) Intake/Output from previous day: 07/06 0701 - 07/07 0700 In: 728.3 [P.O.:25; I.V.:58.3; NG/GT:40; IV Piggyback:450] Out: 3518 [Urine:721; Stool:250] Intake/Output from this shift: Total I/O In: 632.5 [Blood:632.5] Out: 242 [Other:242]  Labs:  Recent Labs  08/28/16 0420 08/28/16 0441  08/29/16 0345  08/29/16 1607 08/29/16 2000 08/29/16 2246 08/30/16 0343  WBC 34.0*  --   --  38.8*  --   --  47.8*  --   --   HGB 9.8*  --   --  8.8*  --   --  9.3*  --   --   HCT 29.8*  --   --  27.2*  --   --  29.2*  --   --   PLT 17*  --   --  27*  --   --  33*  --   --   APTT  --  33  --  33  --   --   --   --   --   CREATININE 2.21*  --   < > 2.08*  < > 1.89*  --  1.69* 1.54*  MG  --   --   < > 1.8  < > 1.8  --  1.7 1.7  PHOS 2.5  --   < > 2.7  2.7  < > 2.9  --  2.6 3.0  ALBUMIN 2.0*  --   < > 2.0*  < > 2.2*  --  2.1* 2.3*  PROT 5.3*  --   --   --   --   --   --   --   --   AST 111*  --   --   --   --   --   --   --   --   ALT 57*  --   --   --   --   --   --   --   --   ALKPHOS 130*  --   --   --   --   --   --   --   --   BILITOT 0.6  --   --   --   --   --   --   --   --   < > = values in this interval not displayed. Estimated Creatinine Clearance: 42.7 mL/min (A) (by C-G formula based on SCr of 1.54 mg/dL (H)).  Medical History: Past Medical History:  Diagnosis Date  . Diabetes mellitus without complication (HCC)   . Hypertension   . Stroke [facility](HCC)     Assessment: 66 y/o F with a h/o DM, HTN, and CVA admitted with septic shock due to pyelonephritis. Patient is now requiring CRRT.   Plan:  No medications require adjustment at present. Pharmacy will continue to  monitor and adjust per consult.   7/7- Patient to transition off CRRT per nephrology. No medication adjustment needed at present  Dore Oquin A 08/30/2016,11:27 AM

## 2016-08-30 NOTE — Progress Notes (Signed)
Notified Dr. Lonn Georgiaonforti of pt WBC 51.6, Platelets 68 post transfusion.  No new orders at this time.

## 2016-08-30 NOTE — Progress Notes (Signed)
Pharmacy Antibiotic Note  Megan FerdinandSarah S Nixon is a 66 y.o. female admitted on 08/24/2016 with septic shock due to pyelonephritis.  Patient was previously on ceftriaxone for Klebsiella pneumoniae UTI and bacteremia. Pharmacy has been consulted for meropenem dosing. Patient is currently receiving CRRT. Vancomycin added 7/6.   Plan: Will continue meropenem 1 g iv q 12 hours.   Vancomycin 1750 mg iv once then 1000 mg iv q 24 hours. Will plan on checking a level prior to the third dose with a goal of 15-20 mcg/ml.   7/7: Patient to come off CRRT- May transition to intermittent hemodialysis if no improving in renal function per Nephrology note. Patient has received Vancomycin 1 gram already today. Will f/u renal fxn tomorrow and reassess dosing. Scr 1.54. Ke 0.034, t1/2 20.39, Vd 54     Adj BW= 77.3 kg   Height: 5\' 8"  (172.7 cm) Weight: 214 lb 15.2 oz (97.5 kg) IBW/kg (Calculated) : 63.9  Temp (24hrs), Avg:97.7 F (36.5 C), Min:97.4 F (36.3 C), Max:98.7 F (37.1 C)   Recent Labs Lab 08/24/16 0824  08/24/16 1201 08/24/16 1600  08/24/16 2117  08/25/16 0515 08/25/16 0847  08/26/16 0532  08/27/16 0620  08/28/16 0420  08/29/16 0345 08/29/16 1013 08/29/16 1607 08/29/16 2000 08/29/16 2246 08/30/16 0343  WBC 14.8*  --   --  23.4*  --   --   < >  --   --   --  39.4*  --  31.3*  --  34.0*  --  38.8*  --   --  47.8*  --   --   CREATININE  --   < >  --  2.57*  < >  --   < >  --   --   < > 2.38*  2.40*  < >  --   < > 2.21*  < > 2.08* 1.85* 1.89*  --  1.69* 1.54*  LATICACIDVEN 10.6*  --  8.5*  --   --  14.0*  --  14.7* 15.2*  --   --   --   --   --   --   --   --   --   --   --   --   --   VANCORANDOM  --   --   --  15  --   --   --   --   --   --   --   --   --   --   --   --   --   --   --   --   --   --   < > = values in this interval not displayed.  Estimated Creatinine Clearance: 42.7 mL/min (A) (by C-G formula based on SCr of 1.54 mg/dL (H)).    No Known Allergies  Antimicrobials  this admission: Vancomycin 7/1 x 1, 7/6 >> Azithromycin 7/1 x 1 Ceftriaxone 7/1 x 1, 7/3 >> 7/4 Zosyn 7/1 >> 7/3 meropenem 7/5 >>   Dose adjustments this admission:   Microbiology results: MRSA PCR: negative BCx: Klebsiella pneumoniae UCx: Klebsiella pneumoniae Nephrostomy tube fluid: Klebsiella pneumoniae TA: normal flora C diff: negative  Thank you for allowing pharmacy to be a part of this patient's care.  Rossana Molchan A 08/30/2016 11:11 AM

## 2016-08-30 NOTE — Progress Notes (Signed)
VSS. Patient started having hematuria at 3:00 am with hematuria.  Continues in DIC. FFPs infusing at this time.   Alert. Husband at bedside. No other care concerns.

## 2016-08-30 NOTE — Progress Notes (Signed)
NP notified of bloody urine.  New order for FFPs given.  Decreased urine output.  Patient awake. Makes needs known.

## 2016-08-30 NOTE — Progress Notes (Signed)
Dr. Wynelle LinkKolluru at bedside, per Dr. Wynelle LinkKolluru will discontinue CRRT therapy, but can allow dialysate bags to complete (about 1.5 hrs before complete) and then d/c.

## 2016-08-30 NOTE — Progress Notes (Signed)
Subjective: CC: Hematuria  Hx: Caliyah has a 5mm LUPJ stone with obstruction and sepsis from Klebsiella.   She has had a progressive leukocytosis to 48K  and persistent thrombocytopenia of 33K and has developed gross hematuria.   She has a 62fr foley which continues to drain but with bloody urine.  She has a left nephrostomy tube with minimal output per discussion with MD but 30-82ml/hr recorded in summary.   Per the nurse report the tube was irrigated last night.   She has had a recent renal US that showed no hydro or perinephric process and a KUB that shows appropriate positioning of the tube.   She has no left flank pain or fever.  She remains on CRRT with a Cr of 1.54.   Her INR is normal.   A CT has been ordered to r/o further intraabdominal issues and that will better assess the position and function of the NT.  Repeat cultures are pending.   ROS:  Review of Systems  Constitutional: Negative for chills and fever.  Genitourinary: Positive for hematuria.  All other systems reviewed and are negative.   Anti-infectives: Anti-infectives    Start     Dose/Rate Route Frequency Ordered Stop   08/30/16 1000  vancomycin (VANCOCIN) IVPB 1000 mg/200 mL premix     1,000 mg 200 mL/hr over 60 Minutes Intravenous Every 24 hours 08/29/16 1238     08/29/16 0930  vancomycin (VANCOCIN) 1,750 mg in sodium chloride 0.9 % 500 mL IVPB     1,750 mg 250 mL/hr over 120 Minutes Intravenous  Once 08/29/16 0843 08/29/16 1153   08/28/16 1000  cefTRIAXone (ROCEPHIN) 2 g in dextrose 5 % 50 mL IVPB  Status:  Discontinued     2 g 100 mL/hr over 30 Minutes Intravenous Every 12 hours 08/28/16 0858 08/28/16 0915   08/28/16 1000  meropenem (MERREM) 1 g in sodium chloride 0.9 % 100 mL IVPB     1 g 200 mL/hr over 30 Minutes Intravenous Every 12 hours 08/28/16 0935     08/26/16 1100  cefTRIAXone (ROCEPHIN) 2 g in dextrose 5 % 50 mL IVPB  Status:  Discontinued     2 g 100 mL/hr over 30 Minutes Intravenous Every 24 hours  08/26/16 1013 08/28/16 0858   08/25/16 1800  azithromycin (ZITHROMAX) 500 mg in dextrose 5 % 250 mL IVPB  Status:  Discontinued     500 mg 250 mL/hr over 60 Minutes Intravenous Every 24 hours 08/24/16 1708 08/24/16 1947   08/24/16 2000  piperacillin-tazobactam (ZOSYN) IVPB 3.375 g  Status:  Discontinued     3.375 g 12.5 mL/hr over 240 Minutes Intravenous Every 12 hours 08/24/16 1404 08/24/16 1405   08/24/16 1600  piperacillin-tazobactam (ZOSYN) IVPB 3.375 g  Status:  Discontinued     3.375 g 12.5 mL/hr over 240 Minutes Intravenous Every 8 hours 08/24/16 1405 08/26/16 1013   08/24/16 1545  cefTRIAXone (ROCEPHIN) 1 g in dextrose 5 % 50 mL IVPB     1 g 100 mL/hr over 30 Minutes Intravenous  Once 08/24/16 1543 08/24/16 1656   08/24/16 1545  azithromycin (ZITHROMAX) 500 mg in dextrose 5 % 250 mL IVPB     500 mg 250 mL/hr over 60 Minutes Intravenous  Once 08/24/16 1543 08/24/16 1801   08/24/16 1415  vancomycin (VANCOCIN) IVPB 1000 mg/200 mL premix  Status:  Discontinued     1,000 mg 200 mL/hr over 60 Minutes Intravenous STAT 08/24/16 1403 08/24/16 1948   08/24/16 0915  vancomycin (VANCOCIN) IVPB 1000 mg/200 mL premix     1,000 mg 200 mL/hr over 60 Minutes Intravenous  Once 08/24/16 0910 08/24/16 1145   08/24/16 0915  piperacillin-tazobactam (ZOSYN) IVPB 3.375 g     3.375 g 100 mL/hr over 30 Minutes Intravenous  Once 08/24/16 0910 08/24/16 1145      Current Facility-Administered Medications  Medication Dose Route Frequency Provider Last Rate Last Dose  . acetaminophen (TYLENOL) tablet 650 mg  650 mg Per Tube Q6H PRN Merwyn Katos, MD      . albuterol (PROVENTIL) (2.5 MG/3ML) 0.083% nebulizer solution 2.5 mg  2.5 mg Nebulization Q2H PRN Shaune Pollack, MD      . bisacodyl (DULCOLAX) EC tablet 5 mg  5 mg Oral Daily PRN Shaune Pollack, MD      . chlorhexidine (PERIDEX) 0.12 % solution 15 mL  15 mL Mouth Rinse BID Conforti, Cabela Pacifico, DO   15 mL at 08/30/16 0952  . fentaNYL (SUBLIMAZE) injection 50-100  mcg  50-100 mcg Intravenous Q1H PRN Marylou Flesher S, NP   100 mcg at 08/29/16 0602  . heparin injection 1,000-6,000 Units  1,000-6,000 Units CRRT Once in dialysis Kolluru, Sarath, MD      . hydrALAZINE (APRESOLINE) injection 10 mg  10 mg Intravenous Q4H PRN Eugenie Norrie, NP   10 mg at 08/30/16 0531  . hydrocortisone sodium succinate (SOLU-CORTEF) 100 MG injection 25 mg  25 mg Intravenous Q8H Merwyn Katos, MD   25 mg at 08/30/16 0504  . insulin aspart (novoLOG) injection 0-20 Units  0-20 Units Subcutaneous Q4H Varughese, Bincy S, NP   4 Units at 08/30/16 0841  . insulin glargine (LANTUS) injection 10 Units  10 Units Subcutaneous BID Merwyn Katos, MD   10 Units at 08/30/16 [phone]  . MEDLINE mouth rinse  15 mL Mouth Rinse q12n4p Conforti, Miliani Deike, DO   15 mL at 08/29/16 1606  . meropenem (MERREM) 1 g in sodium chloride 0.9 % 100 mL IVPB  1 g Intravenous Q12H Valentina Gu, RPH 200 mL/hr at 08/30/16 0952 1 g at 08/30/16 0952  . midazolam (VERSED) injection 2 mg  2 mg Intravenous Q1H PRN Merwyn Katos, MD   2 mg at 08/27/16 1824  . ondansetron (ZOFRAN) tablet 4 mg  4 mg Oral Q6H PRN Shaune Pollack, MD       Or  . ondansetron [facility]) injection 4 mg  4 mg Intravenous Q6H PRN Shaune Pollack, MD      . pantoprazole sodium (PROTONIX) 40 mg/20 mL oral suspension 40 mg  40 mg Per Tube Q1200 Merwyn Katos, MD   40 mg at 08/29/16 1153  . senna-docusate (Senokot-S) tablet 1 tablet  1 tablet Oral QHS PRN Shaune Pollack, MD      . sodium chloride flush (NS) 0.9 % injection 10-40 mL  10-40 mL Intracatheter Q12H Milagros Loll, MD   40 mL at 08/29/16 2211  . sodium chloride flush (NS) 0.9 % injection 10-40 mL  10-40 mL Intracatheter PRN Sudini, Srikar, MD      . vancomycin (VANCOCIN) IVPB 1000 mg/200 mL premix  1,000 mg Intravenous Q24H Luisa Hart D, RPH         Objective: Vital signs in last 24 hours: Temp:  [97.4 F (36.3 C)-98.7 F (37.1 C)] 97.6 F (36.4 C) (07/07 0908) Pulse Rate:  [72-105]  94 (07/07 0908) Resp:  [11-20] 17 (07/07 0908) BP: (120-164)/(55-95) 139/75 (07/07 0908) SpO2:  [90 %-97 %] 97 % (  07/07 0908) Weight:  [97.5 kg (214 lb 15.2 oz)] 97.5 kg (214 lb 15.2 oz) (07/07 0400)  Intake/Output from previous day: 07/06 0701 - 07/07 0700 In: 728.3 [P.O.:25; I.V.:58.3; NG/GT:40; IV Piggyback:450] Out: 3518 [Urine:721; Stool:250] Intake/Output this shift: Total I/O In: 358.3 [Blood:358.3] Out: -    Physical Exam  Constitutional:  Obese, BF in NAD  Abdominal:  Nephrostomy bag is empty.   Genitourinary:  Genitourinary Comments: Foley is draining pink urine.     Lab Results:   Recent Labs  08/29/16 0345 08/29/16 2000  WBC 38.8* 47.8*  HGB 8.8* 9.3*  HCT 27.2* 29.2*  PLT 27* 33*   BMET  Recent Labs  08/29/16 2246 08/30/16 0343  NA 139 139  K 3.9 4.5  CL 107 106  CO2 27 27  GLUCOSE 115* 139*  BUN 41* 39*  CREATININE 1.69* 1.54*  CALCIUM 8.2* 8.5*   PT/INR  Recent Labs  08/28/16 0441 08/29/16 0345  LABPROT 15.7* 15.9*  INR 1.24 1.26   ABG  Recent Labs  08/29/16 0840  PHART 7.39  HCO3 29.7*    Studies/Results: Dg Abd 1 View  Result Date: 08/28/2016 CLINICAL DATA:  Decreased bowel sounds, left nephrostomy to a EXAM: ABDOMEN - 1 VIEW COMPARISON:  Abdomen films of 08/25/2016 FINDINGS: Left nephrostomy tube is again noted. NG tube is present with the tip in the region of the antrum - duodenal bulb. The bowel gas pattern is nonspecific. Cardiomegaly is again noted. IMPRESSION: 1. NG tube tip overlies the distal antrum -duodenal bulb region. 2. No bowel obstruction. 3. Left nephrostomy tube. Electronically Signed   By: Dwyane Dee M.D.   On: 08/28/2016 17:05   US Renal  Result Date: 08/28/2016 CLINICAL DATA:  Intubated patient. 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 Clinical concern of perinephric abscess. EXAM: RENAL / URINARY TRACT ULTRASOUND COMPLETE COMPARISON:  Noncontrast abdominopelvic CT  scan of August 24, 2016. FINDINGS: Right Kidney: Length: 13 cm. Echogenicity is approximately equal to that of the liver. No mass or hydronephrosis visualized. No abnormal perinephric fluid collections are observed. Left Kidney: Length: 12.5 cm. There is an upper pole cyst measuring 1.6 cm in greatest dimension. There is no hydronephrosis. The renal cortical echotexture is similar to that on the right. No abnormal perinephric fluid collections are observed. Bladder: Decompressed with a Foley catheter. IMPRESSION: Mildly increased renal cortical echotexture suggests medical renal disease. No perinephric abscess is observed. There is no hydronephrosis. No perinephric abscess is observed. If the patient's symptoms warrant further imaging, CT scanning would be the most useful next imaging step. Electronically Signed   By: David  [place] M.D.   On: 08/28/2016 11:40   Dg Chest Port 1 View  Result Date: 08/30/2016 CLINICAL DATA:  Initial evaluation for acute respiratory failure. EXAM: PORTABLE CHEST 1 VIEW COMPARISON:  Prior radiograph from 08/28/2016. FINDINGS: Interval removal of endotracheal and enteric tubes. Right IJ approach central venous catheter remains in place with tip overlying the cavoatrial junction. Stable cardiomegaly. Mediastinal silhouette unchanged. Lungs are hypoinflated. Persistent pulmonary vascular congestion without frank pulmonary edema. Persistent left pleural effusion, with decreased right pleural effusion. Probable superimposed bibasilar atelectasis. No definite focal infiltrates. No pneumothorax. No acute osseus abnormality. IMPRESSION: 1. Interval removal of endotracheal and enteric tubes. Stable right IJ central venous catheter. 2. Stable cardiomegaly with mild CHF, slightly improved from previous. Persistent small bilateral pleural effusions. 3. Bibasilar atelectasis. Electronically Signed   By: Rise Mu M.D.   On: 08/30/2016  06:11   KUB and US films and reports reviewed.  Labs  reviewed.  Case discussed with DR. Conforti.  Assessment and Plan: Gross hematuria which is probably secondary to foley irritation and thrombocytopenia.   Consider removal of foley after collection of a urine culture.    LUPJ stone with sepsis.   Nephrostomy appears to be draining based on I&O's.   I will have the tube irrigated qshift to insure patency.   CT pending which will assess position.         LOS: 6 days    Anner CreteWRENN,Aurel Nguyen J 08/30/2016 [phone] ID: Megan FerdinandSarah S Grenz, female   DOB: 01-31-1951, 66 y.o.   MRN: [id number]

## 2016-08-30 NOTE — Evaluation (Signed)
Clinical/Bedside Swallow Evaluation Patient Details  Name: Megan Nixon MRN: [id number] Date of Birth: 20-Apr-1950  Today's Date: 08/30/2016 Time: SLP Start Time (ACUTE ONLY): 0900 SLP Stop Time (ACUTE ONLY): 1000 SLP Time Calculation (min) (ACUTE ONLY): 60 min  Past Medical History:  Past Medical History:  Diagnosis Date  . Diabetes mellitus without complication (HCC)   . Hypertension   . Stroke [facility])    Past Surgical History:  Past Surgical History:  Procedure Laterality Date  . BREAST EXCISIONAL BIOPSY Bilateral 1971   neg  . IR NEPHROSTOMY PLACEMENT LEFT  08/24/2016   HPI:  Pt  is a 66 y.o. female with a known history of Hypertension, diabetes, history of kidney stones and old CVA. The patient is confused, unable to provide any information. According to her husband, patient was was found confused last night. He cannot provide any other information. The patient was found tachycardia, hypotension, lactic acidosis. Urinalysis show UTI, CAT scan of the chest also multifocal pneumonia. The patient has respiratory distress with hypoxia, she is put on BiPAP just now. She is given Zosyn and vancomycin in the ED. Pt was orally intubated/extubated at admission; receives CRRT. Pt was extubated yesterday and has been NPO until this morning. Pt denies any swallowing discomfort or pain(odynophagia) and has sipped on water since extubation w/out difficulty.   Assessment / Plan / Recommendation Clinical Impression  Pt appears to present w/ an adequate oropharyngeal phase swallow function w/ no immediate, overt s/s of aspiration noted w/ po trials; no decline in respiratory status or change in vocal quality post trials. Oral phase appeared wfl for bolus management and oral clearing. Pt appears at reduced risk for aspiration when following general aspiration precautions - she has had extended illness w/ oral intubation. Pt does require assistance w/ feeding self d/t UE weakness overall. Husband stated  he would be present at meals and will assist w/ feeding and cutting foods. Recommend a Regular diet consistency w/ thin liquids; aspiration precautions; pills in Puree if easier for swallowing. NSG to reconsult if any change in status while admitted. Updated NSG/MD.  SLP Visit Diagnosis: Dysphagia, oropharyngeal phase (R13.12)    Aspiration Risk   (reduced w/ general precautions)    Diet Recommendation  Regular/mech soft diet w/ foods moistened well; Thin liquids. General aspiration precautions; support at meals w/ feeding  Medication Administration: Whole meds with puree (if easier for swallowing)    Other  Recommendations Recommended Consults:  (Dietician f/u) Oral Care Recommendations: Oral care BID;Staff/trained caregiver to provide oral care;Patient independent with oral care   Follow up Recommendations None      Frequency and Duration            Prognosis Prognosis for Safe Diet Advancement: Good Barriers to Reach Goals:  (deconditioning; illness)      Swallow Study   General Date of Onset: 08/24/16 HPI: Pt  is a 66 y.o. female with a known history of Hypertension, diabetes, history of kidney stones and old CVA. The patient is confused, unable to provide any information. According to her husband, patient was was found confused last night. He cannot provide any other information. The patient was found tachycardia, hypotension, lactic acidosis. Urinalysis show UTI, CAT scan of the chest also multifocal pneumonia. The patient has respiratory distress with hypoxia, she is put on BiPAP just now. She is given Zosyn and vancomycin in the ED. Pt was orally intubated/extubated at admission; receives CRRT. Pt was extubated yesterday and has been NPO until this  morning. Pt denies any swallowing discomfort or pain(odynophagia) and has sipped on water since extubation w/out difficulty. Type of Study: Bedside Swallow Evaluation Previous Swallow Assessment: none indicated Diet Prior to this  Study: NPO (regular at home) Temperature Spikes Noted: No (wbc elevated) Respiratory Status: Nasal cannula (1.5-2 liters) History of Recent Intubation: Yes Length of Intubations (days): 6 days Date extubated: 08/29/16 Behavior/Cognition: Alert;Cooperative;Pleasant mood;Requires cueing (min) Oral Cavity Assessment: Within Functional Limits Oral Care Completed by SLP: Recent completion by staff Oral Cavity - Dentition: Adequate natural dentition Vision: Functional for self-feeding Self-Feeding Abilities: Able to feed self;Needs assist;Needs set up (weak UEs) Patient Positioning: Upright in bed Baseline Vocal Quality: Normal;Low vocal intensity (min) Volitional Cough: Strong Volitional Swallow: Able to elicit    Oral/Motor/Sensory Function Overall Oral Motor/Sensory Function: Within functional limits   Ice Chips Ice chips: Within functional limits Presentation: Spoon (fed; 3 trials)   Thin Liquid Thin Liquid: Within functional limits Presentation: Cup;Self Fed;Straw (assisted; 8 trials total) Other Comments: did not want any more    Nectar Thick Nectar Thick Liquid: Not tested   Honey Thick Honey Thick Liquid: Not tested   Puree Puree: Within functional limits Presentation: Spoon (fed; 5 trials)   Solid   GO   Solid: Within functional limits (mech soft consistency) Presentation: Spoon (fed; 1 trial) Other Comments: did not want any more         Jerilynn SomKatherine Cainen Burnham, MS, CCC-SLP Leahmarie Gasiorowski 08/30/2016,10:49 AM

## 2016-08-30 NOTE — Progress Notes (Signed)
Notified Dr. Annabell HowellsWrenn with Urology of Ct abdomen/pelvis results, that there is a  no evidence for left hydronephrosis on today's exam. The UPJ stone seen on the previous study is no longer evident although this stone may now be in the central renal pelvis. Another 6 mm stone is identified in the lower pole left kidney.  Also notified Dr. Annabell HowellsWrenn that foley has been removed, and that nephrostomy tube has been irrigated (able to withdraw amount irrigated with) and that there has been minimal drainage from nephrostomy tube.  No new orders at this time per Dr. Annabell HowellsWrenn.

## 2016-08-30 NOTE — Progress Notes (Signed)
[employer] [facility] [place] [facility]   PATIENT NAME: Megan Nixon    MR#:  [id number]  DATE OF BIRTH:  26-Aug-1950  SUBJECTIVE:   Extubated  Patient now off Pressors. CRRTBeing turned off. Urine output 585 cc Husband at bedside Answers basic questions well. Denies any pain.  REVIEW OF SYSTEMS:    Review of Systems  Constitutional: Negative for chills, fever and weight loss.  HENT: Negative for ear discharge, ear pain and nosebleeds.   Eyes: Negative for blurred vision, pain and discharge.  Respiratory: Negative for sputum production, shortness of breath, wheezing and stridor.   Cardiovascular: Negative for chest pain, palpitations, orthopnea and PND.  Gastrointestinal: Negative for abdominal pain, diarrhea, nausea and vomiting.  Genitourinary: Negative for frequency and urgency.  Musculoskeletal: Negative for back pain and joint pain.  Neurological: Positive for weakness. Negative for sensory change, speech change and focal weakness.  Psychiatric/Behavioral: Negative for depression and hallucinations. The patient is not nervous/anxious.    DRUG ALLERGIES:  No Known Allergies  VITALS:  Blood pressure (!) 152/80, pulse (!) 103, temperature 97.7 F (36.5 C), temperature source Oral, resp. rate 19, height 5\' 8"  (1.727 m), weight 97.5 kg (214 lb 15.2 oz), SpO2 96 %.  PHYSICAL EXAMINATION:   Physical Exam  GENERAL:  66 y.o.-year-old patient lying in the bed. Looks critically ill, Limited exam.Extubated EYES: Pupils equal, round, reactive to light HEENT: Head atraumatic, normocephalic. Oropharynx and nasopharynx clear. ET tube in place NECK:  Supple, no jugular venous distention. No thyroid enlargement, no tenderness.  LUNGS: Bilateral decreased breath sounds bases. No use of accessory muscles. CARDIOVASCULAR: S1, S2 normal. Tachycardia, no murmur ABDOMEN: Soft, nontender, nondistended. Bowel sounds present. No organomegaly or mass. Foley catheter in  place EXTREMITIES: Patient does have cyanosis/ischemia bilateral lower extremity left more than right 3+ pitting edema both lower extremities    Cerebrovascular system moves all extremities well. Generalized deconditioning. Nonfocal grossly.;   LABORATORY PANEL:   CBC  Recent Labs Lab 08/29/16 2000  WBC 47.8*  HGB 9.3*  HCT 29.2*  PLT 33*   ------------------------------------------------------------------------------------------------------------------ Chemistries   Recent Labs Lab 08/28/16 0420  08/30/16 0343  NA 140  < > 139  K 3.8  < > 4.5  CL 107  < > 106  CO2 27  < > 27  GLUCOSE 213*  < > 139*  BUN 52*  < > 39*  CREATININE 2.21*  < > 1.54*  CALCIUM 7.6*  < > 8.5*  MG  --   < > 1.7  AST 111*  --   --   ALT 57*  --   --   ALKPHOS 130*  --   --   BILITOT 0.6  --   --   < > = values in this interval not displayed. ------------------------------------------------------------------------------------------------------------------  Cardiac Enzymes  Recent Labs Lab 08/28/16 0420  TROPONINI 0.72*   ------------------------------------------------------------------------------------------------------------------  RADIOLOGY:  Dg Abd 1 View  Result Date: 08/28/2016 CLINICAL DATA:  Decreased bowel sounds, left nephrostomy to a EXAM: ABDOMEN - 1 VIEW COMPARISON:  Abdomen films of 08/25/2016 FINDINGS: Left nephrostomy tube is again noted. NG tube is present with the tip in the region of the antrum - duodenal bulb. The bowel gas pattern is nonspecific. Cardiomegaly is again noted. IMPRESSION: 1. NG tube tip overlies the distal antrum -duodenal bulb region. 2. No bowel obstruction. 3. Left nephrostomy tube. Electronically Signed   By: Dwyane Dee M.D.   On: 08/28/2016 17:05   Dg Chest  Port 1 View  Result Date: 08/30/2016 CLINICAL DATA:  Initial evaluation for acute respiratory failure. EXAM: PORTABLE CHEST 1 VIEW COMPARISON:  Prior radiograph from 08/28/2016. FINDINGS:  Interval removal of endotracheal and enteric tubes. Right IJ approach central venous catheter remains in place with tip overlying the cavoatrial junction. Stable cardiomegaly. Mediastinal silhouette unchanged. Lungs are hypoinflated. Persistent pulmonary vascular congestion without frank pulmonary edema. Persistent left pleural effusion, with decreased right pleural effusion. Probable superimposed bibasilar atelectasis. No definite focal infiltrates. No pneumothorax. No acute osseus abnormality. IMPRESSION: 1. Interval removal of endotracheal and enteric tubes. Stable right IJ central venous catheter. 2. Stable cardiomegaly with mild CHF, slightly improved from previous. Persistent small bilateral pleural effusions. 3. Bibasilar atelectasis. Electronically Signed   By: Rise MuBenjamin  McClintock M.D.   On: 08/30/2016 06:11     ASSESSMENT AND PLAN:   * Septic shockDue to Klebsiella bacteremia source urine Patient now extubated -Off IV pressors -White count up to 38,000---47,000 - Spoke with Dr. Lonn Georgiaonforti. CT abdomen today. -On IV Solu-Cortef stress dose---needs to be weaned off -IV meropenem -Received 1 dose of vancomycin07/07/2016  * Left pyelonephritis with UPJ calculus and hydro-nephrosisStatus post percutaneous nephrostomy tube placement on admission  -urine culture positive for Klebsiella -Patient was on IV Rocephin----now on IV meropenem -Consider ID consultation. -CT abdomen to rule out any fluid collection/abscess  * Acute kidney injury/pain unit secondary to septic shock ATN -On CRRT--- which is discontinued on 08/30/2016  * ARDS with Acute hypoxic respiratory failure --Status post mechanical ventilator now extubated   *Severe DIC due to #1 -Patient receiving platelets and FFP's according to her labs  *Bilateral lower extremity ischemia more on the left than right in the setting of sepsis/DIC  -Vascular recommendations noted. Patient has good peripheral pedal pulses.  Patient is  critically ill With high risk for cardiac arrest and death  All the records are reviewed and case discussed with Care Management/Social Workerr. Management plans discussed with the patient, family and they are in agreement.  CODE STATUS: FULL CODE  DVT Prophylaxis: SCDs  TOTAL TIME TAKING CARE OF THIS PATIENT: 25 minutes.    Heidie Krall M.D on 08/30/2016 at 11:37 AM  Between 7am to 6pm - Pager - [phone]  After 6pm go to [email or web address] - password [email or web address]  [facility] [place] [facility]  Office  [phone]  CC: Primary care physician; Danella PentonMiller, Mark F, MD  Note: This dictation was prepared with Dragon dictation along with smaller phrase technology. Any transcriptional errors that result from this process are unintentional.

## 2016-08-30 NOTE — Progress Notes (Signed)
Patient tolerating infusion of FFPs at this time. No distress noted.

## 2016-08-31 DIAGNOSIS — A419 Sepsis, unspecified organism: Secondary | ICD-10-CM

## 2016-08-31 DIAGNOSIS — N132 Hydronephrosis with renal and ureteral calculous obstruction: Secondary | ICD-10-CM

## 2016-08-31 LAB — BPAM FFP
BLOOD PRODUCT EXPIRATION DATE: 201807122359
ISSUE DATE / TIME: 201807070555
UNIT TYPE AND RH: 5100

## 2016-08-31 LAB — GLUCOSE, CAPILLARY
GLUCOSE-CAPILLARY: 136 mg/dL — AB (ref 65–99)
GLUCOSE-CAPILLARY: 203 mg/dL — AB (ref 65–99)
GLUCOSE-CAPILLARY: 108 mg/dL — AB (ref 65–99)
GLUCOSE-CAPILLARY: 107 mg/dL — AB (ref 65–99)
Glucose-Capillary: 117 mg/dL — ABNORMAL HIGH (ref 65–99)
Glucose-Capillary: 74 mg/dL (ref 65–99)

## 2016-08-31 LAB — CBC
HCT: 24.5 % — ABNORMAL LOW (ref 35.0–47.0)
HEMATOCRIT: 26.8 % — AB (ref 35.0–47.0)
HEMOGLOBIN: 8.7 g/dL — AB (ref 12.0–16.0)
Hemoglobin: 8.2 g/dL — ABNORMAL LOW (ref 12.0–16.0)
MCH: 28.9 pg (ref 26.0–34.0)
MCH: 27.7 pg (ref 26.0–34.0)
MCHC: 33.2 g/dL (ref 32.0–36.0)
MCHC: 32.6 g/dL (ref 32.0–36.0)
MCV: 87.1 fL (ref 80.0–100.0)
MCV: 85.1 fL (ref 80.0–100.0)
PLATELETS: 79 10*3/uL — AB (ref 150–440)
Platelets: 93 10*3/uL — ABNORMAL LOW (ref 150–440)
RBC: 2.82 MIL/uL — ABNORMAL LOW (ref 3.80–5.20)
RBC: 3.15 MIL/uL — AB (ref 3.80–5.20)
RDW: 17 % — AB (ref 11.5–14.5)
RDW: 16.6 % — ABNORMAL HIGH (ref 11.5–14.5)
WBC: 46.9 10*3/uL — ABNORMAL HIGH (ref 3.6–11.0)
WBC: 47.1 10*3/uL — ABNORMAL HIGH (ref 3.6–11.0)

## 2016-08-31 LAB — BASIC METABOLIC PANEL
Anion gap: 10 (ref 5–15)
BUN: 73 mg/dL — AB (ref 6–20)
CHLORIDE: 106 mmol/L (ref 101–111)
CO2: 24 mmol/L (ref 22–32)
Calcium: 8.6 mg/dL — ABNORMAL LOW (ref 8.9–10.3)
Creatinine, Ser: 3.4 mg/dL — ABNORMAL HIGH (ref 0.44–1.00)
GFR calc Af Amer: 15 mL/min — ABNORMAL LOW (ref >60)
GFR calc non Af Amer: 13 mL/min — ABNORMAL LOW (ref >60)
GLUCOSE: 126 mg/dL — AB (ref 65–99)
POTASSIUM: 3.5 mmol/L (ref 3.5–5.1)
SODIUM: 140 mmol/L (ref 135–145)

## 2016-08-31 LAB — PREPARE PLATELET PHERESIS: Unit division: 0

## 2016-08-31 LAB — COMPREHENSIVE METABOLIC PANEL
ALBUMIN: 2.2 g/dL — AB (ref 3.5–5.0)
ALT: 73 U/L — ABNORMAL HIGH (ref 14–54)
ANION GAP: 5 (ref 5–15)
AST: 168 U/L — ABNORMAL HIGH (ref 15–41)
Alkaline Phosphatase: 102 U/L (ref 38–126)
BILIRUBIN TOTAL: 1 mg/dL (ref 0.3–1.2)
BUN: 56 mg/dL — ABNORMAL HIGH (ref 6–20)
CHLORIDE: 107 mmol/L (ref 101–111)
CO2: 27 mmol/L (ref 22–32)
Calcium: 8.2 mg/dL — ABNORMAL LOW (ref 8.9–10.3)
Creatinine, Ser: 2.46 mg/dL — ABNORMAL HIGH (ref 0.44–1.00)
GFR calc Af Amer: 22 mL/min — ABNORMAL LOW (ref >60)
GFR calc non Af Amer: 19 mL/min — ABNORMAL LOW (ref >60)
GLUCOSE: 142 mg/dL — AB (ref 65–99)
POTASSIUM: 4.3 mmol/L (ref 3.5–5.1)
SODIUM: 139 mmol/L (ref 135–145)
TOTAL PROTEIN: 5.3 g/dL — AB (ref 6.5–8.1)

## 2016-08-31 LAB — BPAM PLATELET PHERESIS
Blood Product Expiration Date: 201807082359
ISSUE DATE / TIME: 201807070857
Unit Type and Rh: 5100

## 2016-08-31 LAB — PREPARE FRESH FROZEN PLASMA: UNIT DIVISION: 0

## 2016-08-31 LAB — PROTIME-INR
INR: 1.32
INR: 1.3
PROTHROMBIN TIME: 16.5 s — AB (ref 11.4–15.2)
Prothrombin Time: 16.3 seconds — ABNORMAL HIGH (ref 11.4–15.2)

## 2016-08-31 LAB — URINE CULTURE: CULTURE: NO GROWTH

## 2016-08-31 LAB — APTT
APTT: 30 s (ref 24–36)
aPTT: 31 seconds (ref 24–36)

## 2016-08-31 LAB — MAGNESIUM: Magnesium: 1.9 mg/dL (ref 1.7–2.4)

## 2016-08-31 LAB — PHOSPHORUS: Phosphorus: 4.1 mg/dL (ref 2.5–4.6)

## 2016-08-31 LAB — LACTIC ACID, PLASMA: LACTIC ACID, VENOUS: 1.2 mmol/L (ref 0.5–1.9)

## 2016-08-31 LAB — PROCALCITONIN: PROCALCITONIN: 31.79 ng/mL

## 2016-08-31 MED ORDER — CITALOPRAM HYDROBROMIDE 20 MG PO TABS
20.0000 mg | ORAL_TABLET | Freq: Every day | ORAL | Status: DC
  Administered 2016-08-31 – 2016-09-05 (×6): 20 mg via ORAL
  Filled 2016-08-31 (×6): qty 1

## 2016-08-31 MED ORDER — ENSURE ENLIVE PO LIQD
237.0000 mL | Freq: Three times a day (TID) | ORAL | Status: DC
  Administered 2016-08-31 – 2016-09-01 (×3): 237 mL via ORAL

## 2016-08-31 MED ORDER — VANCOMYCIN HCL IN DEXTROSE 1-5 GM/200ML-% IV SOLN
1000.0000 mg | INTRAVENOUS | Status: DC
  Filled 2016-08-31: qty 200

## 2016-08-31 MED ORDER — INSULIN GLARGINE 100 UNIT/ML ~~LOC~~ SOLN
20.0000 [IU] | Freq: Every day | SUBCUTANEOUS | Status: DC
  Administered 2016-09-01 – 2016-09-04 (×4): 20 [IU] via SUBCUTANEOUS
  Filled 2016-08-31 (×5): qty 0.2

## 2016-08-31 NOTE — Progress Notes (Signed)
Pharmacy Antibiotic Note  Megan Nixon is a 66 y.o. female admitted on 08/24/2016 with septic shock due to pyelonephritis.  Patient was previously on ceftriaxone for Klebsiella pneumoniae UTI and bacteremia. Pharmacy has been consulted for meropenem and vancomcyin dosing.  Patient was on CRRT- now off as of 7/8-nephrology folowwing for need for Hemodialysis.  Vancomycin added 7/6.   Plan: Day 4-  meropenem 1 g iv q 12 hours.   Vancomycin 1750 mg iv once then 1000 mg iv q 24 hours. Will plan on checking a level prior to the third dose with a goal of 15-20 mcg/ml.   7/7: Patient to come off CRRT- May transition to intermittent hemodialysis if no improving in renal function per Nephrology note. Patient has received Vancomycin 1 gram already today. Will f/u renal fxn tomorrow and reassess dosing. Scr 1.54. Ke 0.034, t1/2 20.39, Vd 54     Adj BW= 77.3 kg  7/8: Scr increased to 2.46. Will adjust Vancomycin to 1 gram IV q36h.  Ke 0.023, t1/2 30,  Vd 53   Adj BW= 75.9 kg Follow renal fxn and if patient is started on HD. ID consulted.        Height: 5\' 8"  (172.7 cm) Weight: 207 lb 3.7 oz (94 kg) IBW/kg (Calculated) : 63.9  Temp (24hrs), Avg:98.4 F (36.9 C), Min:97.5 F (36.4 C), Max:99.1 F (37.3 C)   Recent Labs Lab 08/24/16 1201 08/24/16 1600  08/24/16 2117  08/25/16 0515 08/25/16 0847  08/28/16 0420  08/29/16 0345 08/29/16 1013 08/29/16 1607 08/29/16 2000 08/29/16 2246 08/30/16 0343 08/30/16 1201 08/31/16 0421  WBC  --  23.4*  --   --   < >  --   --   < > 34.0*  --  38.8*  --   --  47.8*  --   --  51.6* 46.9*  CREATININE  --  2.57*  < >  --   < >  --   --   < > 2.21*  < > 2.08* 1.85* 1.89*  --  1.69* 1.54*  --  2.46*  LATICACIDVEN 8.5*  --   --  14.0*  --  14.7* 15.2*  --   --   --   --   --   --   --   --   --   --   --   VANCORANDOM  --  15  --   --   --   --   --   --   --   --   --   --   --   --   --   --   --   --   < > = values in this interval not displayed.   Estimated Creatinine Clearance: 26.2 mL/min (A) (by C-G formula based on SCr of 2.46 mg/dL (H)).    No Known Allergies  Antimicrobials this admission: Vancomycin 7/1 x 1, 7/6 >> Azithromycin 7/1 x 1 Ceftriaxone 7/1 x 1, 7/3 >> 7/4 Zosyn 7/1 >> 7/3 meropenem 7/5 >>   Dose adjustments this admission:   Microbiology results: MRSA PCR: negative BCx: Klebsiella pneumoniae UCx: Klebsiella pneumoniae Nephrostomy tube fluid: Klebsiella pneumoniae TA: normal flora C diff: negative  Thank you for allowing pharmacy to be a part of this patient's care.  Pride Gonzales A 08/31/2016 11:51 AM

## 2016-08-31 NOTE — Progress Notes (Signed)
PULMONARY / CRITICAL CARE MEDICINE   Name: Megan Nixon MRN: [id number] DOB: [birth year]    ADMISSION DATE:  08/24/2016  PT PROFILE:   66 y.o. F with hx of DM, hypertension admitted with severe sepsis/septic shock due to pyelonephritis, left hydronephrosis, klebsiella bacteremia  MAJOR EVENTS/TEST RESULTS: 07/01 admitted to ICU via emergency department as above 07/01 CT renal stone study: 5.5 x 5.0 mm left UPJ calculus with high-grade obstructive findings 07/01 CTA chest: No PE, patchy bilateral infiltrates 07/01 urology consultation Griffin Dakin): PCN placement recommended. Recommended 14 day course of antibiotics for complicated UTI. "Can plan definitive stone treatment once infection has been treated (between 2 and 6 weeks post PCN placement in most cases)"  07/01 left PCN tube placed by IR 07/02 echocardiogram: LVEF 55-60% 07/02 nephrology consultation - CRRT ordered 07/03 decreasing vasopressor requirements. Remains on CRRT. Severe DIC persists 07/04 Off vasopressin. Low dose norepinephrine. Awakens but not F/C. DIC persists 07/05 off of all vasopressors. More awake and responsive. Follows commands intermittently. 07/05 renal ultrasound (to rule out perinephric abscess):   INDWELLING DEVICES:: ETT 07/01 >>  R IJ CVL 07/01 >>  R radial art line 07/01 >> 07/05 L femoral HD cath 07/02 >>   MICRO DATA: MRSA PCR 07/01 >> NEG Urine 07/01 >> Klebsiella Resp 07/01 >> NOF Blood 07/01 >> 2/2 Klebsiella  ANTIMICROBIALS:  Vanc 07/01 >> 07/01 Pip-tazo 07/01 >> 07/02 Ceftriaxone 07/02 >> 07/05 Meropenem 07/05 >>    SUBJECTIVE:  Over the last 24 hours patient has had CT scan of the abdomen, has been seen by GU, nephrostomy tube has been flushed and is draining, pending infectious disease input, on broad-spectrum coverage, peak white count 51.6, this morning's decreased to 46.9.  VITAL SIGNS: BP 140/82   Pulse 93   Temp 97.8 F (36.6 C) (Oral)   Resp 19   Ht 5\' 8"  (1.727 m)    Wt 94 kg (207 lb 3.7 oz)   SpO2 (!) 86%   BMI 31.51 kg/m    INTAKE / OUTPUT: I/O last 3 completed shifts: In: 2307.5 [P.O.:150; I.V.:50; Blood:632.5; Other:475; IV Piggyback:1000] Out: 2910 [Urine:1085; MVHQI:6962; Stool:250]  PHYSICAL EXAMINATION: General: Intubated, sedated, RASS 0 Neuro: PERRLA, EOMI, MAEs HEENT: NCAT, sclerae white. Mild scleral edema Cardiovascular: Regular, no murmurs Lungs: No wheezes Abdomen: Soft, diminished bowel sounds, no masses Ext: Feet and hands are warmer, mottling of all 4 extremities improved, there is some superficial skin necrosis on tip of her right thumb and several toes on the left foot.   LABS:  BMET  Recent Labs Lab 08/29/16 2246 08/30/16 0343 08/31/16 0421  NA 139 139 139  K 3.9 4.5 4.3  CL 107 106 107  CO2 27 27 27   BUN 41* 39* 56*  CREATININE 1.69* 1.54* 2.46*  GLUCOSE 115* 139* 142*    Electrolytes  Recent Labs Lab 08/29/16 2246 08/30/16 0343 08/31/16 0421  CALCIUM 8.2* 8.5* 8.2*  MG 1.7 1.7 1.9  PHOS 2.6 3.0 4.1    CBC  Recent Labs Lab 08/29/16 2000 08/30/16 1201 08/31/16 0421  WBC 47.8* 51.6* 46.9*  HGB 9.3* 8.6* 8.2*  HCT 29.2* 26.6* 24.5*  PLT 33* 68* 79*    Coag's  Recent Labs Lab 08/28/16 0441 08/29/16 0345 08/30/16 1201  APTT 33 33 56*  INR 1.24 1.26 1.44    Sepsis Markers  Recent Labs Lab 08/24/16 2117 08/25/16 0515 08/25/16 0847  08/29/16 2000 08/30/16 0343 08/31/16 0421  LATICACIDVEN 14.0* 14.7* 15.2*  --   --   --   --  PROCALCITON  --   --   --   < > 44.49 37.93 31.79  < > = values in this interval not displayed.  ABG  Recent Labs Lab 08/25/16 0046 08/25/16 0508 08/29/16 0840  PHART 7.23* 7.35 7.39  PCO2ART 31* 35 49*  PO2ART 94 75* 69*    Liver Enzymes  Recent Labs Lab 08/27/16 0505  08/28/16 0420  08/29/16 2246 08/30/16 0343 08/31/16 0421  AST 69*  --  111*  --   --   --  168*  ALT 55*  --  57*  --   --   --  73*  ALKPHOS 98  --  130*  --   --    --  102  BILITOT 0.7  --  0.6  --   --   --  1.0  ALBUMIN 2.2*  < > 2.0*  < > 2.1* 2.3* 2.2*  < > = values in this interval not displayed.  Cardiac Enzymes  Recent Labs Lab 08/24/16 2116 08/25/16 0508 08/28/16 0420  TROPONINI 0.77* 1.25* 0.72*    Glucose  Recent Labs Lab 08/30/16 0729 08/30/16 1145 08/30/16 1536 08/30/16 1954 08/31/16 0007 08/31/16 0400  GLUCAP 158* 164* 130* 124* 117* 74    CXR:  No significant change, low volumes, probable right effusion and possible left effusion with patchy airspace disease   ASSESSMENT / PLAN:  PULMONARY A: Status post extubation, doing well on nasal cannula from a pulmonary perspective  CARDIOVASCULAR A:  Septic shock - Has been successfully weaned off of pressors   RENAL A:  Renal failure. was on CRRT, being converted to intermittent as needed per nephrology   HEMATOLOGIC A:  Platelet count is now 79, status post transfusion. Bleeding had resolved, Foley catheter has subsequently been removed, INR was 1.44, PTT is 56. If any additional evidence of bleeding will give fresh frozen plasma  INFECTIOUS A:   L  Pyelonephritis/hydronephrosis, white count has decreased to 46.9, CT scan of the abdomen was performed, nephrostomy tube was flushed, kidneys decompressed on CT scan without any obvious fluid collection to be drained. Will try to get a PICC line today and remove central line. We'll continue antibiotic coverage per now, pro calcitonin is elevated so we'll continue antibiotic coverage. If white count increases may empirically add antifungal coverage pending infectious disease input. Foley catheter is also been removed  ENDOCRINE A:   DM 2, controlled Relative adrenal insufficiency P:   Cont Lantus/SSI - initiated 07/03 Decrease hydrocortisone to 25 mg q 12  Vascular ischemia Appreciate vascular surgery consultation. He did not feel any acute intervention at this time, I do feel however that her toes may progress and  require repeat dictation at some point.   CCM time: 45 mins The above time includes time spent in consultation with patient and/or family members and reviewing care plan on multidisciplinary rounds  Tora KindredJohn Kendra Grissett, DO PCCM service Mobile [phone] Pager [phone] 08/31/2016 7:44 AM

## 2016-08-31 NOTE — Progress Notes (Signed)
Physical Therapy Evaluation Patient Details Name: Megan FerdinandSarah S Kuba MRN: [id number] DOB: [birth year] Today's Date: 08/31/2016   History of Present Illness  Patient is a 66 y.o. female admitted on 01 JUL with septic shock. Patient confused and found to have UTI and multifocal pneumonia. PMH includes HTN, DMII, kidney stones, and CVA.  Clinical Impression  Patient is a pleasant female admitted for above listed reasons. Patient previously independent, attending curves 3x/week. Patient on evaluation demonstrates need for moderate to +2 assistance with bed mobility and +2 assistance for sit to stand transfer with RW. Patient shows generalized weakness and decreased activity tolerance and will benefit from skilled and progressive PT to return to PLOF. Patient will benefit from f/u at SNF upon d/c when medically ready.    Follow Up Recommendations SNF    Equipment Recommendations  Rolling walker with 5" wheels    Recommendations for Other Services       Precautions / Restrictions Precautions Precautions: Fall Restrictions Weight Bearing Restrictions: No      Mobility  Bed Mobility Overal bed mobility: Needs Assistance Bed Mobility: Supine to Sit;Sit to Supine     Supine to sit: Mod assist Sit to supine: +2 for physical assistance   General bed mobility comments: Patient performs supine to sit transfer with moderate assistance, utilizing bed rails. Patient sat at EOB >10 mins with initial unsteadiness that gradually improved with time. Patient required +2 assistance to return to supine, secondary to fatigue.  Transfers Overall transfer level: Needs assistance Equipment used: Rolling walker (2 wheeled) Transfers: Sit to/from Stand Sit to Stand: +2 physical assistance         General transfer comment: Patient performed sit to stand transfer x2 with +2 assistance but less overall assistance on the second attempt. Required verbal cues to activate gluteal muscles and extend  knees.  Ambulation/Gait                Stairs            Wheelchair Mobility    Modified Rankin (Stroke Patients Only)       Balance Overall balance assessment: Needs assistance Sitting-balance support: Bilateral upper extremity supported Sitting balance-Leahy Scale: Fair     Standing balance support: Bilateral upper extremity supported Standing balance-Leahy Scale: Poor                               Pertinent Vitals/Pain Pain Assessment: No/denies pain    Home Living Family/patient expects to be discharged to:: Private residence Living Arrangements: Spouse/significant other Available Help at Discharge: Family;Available PRN/intermittently Type of Home: House Home Access: Stairs to enter   Entrance Stairs-Number of Steps: 15 Home Layout: One level Home Equipment: None      Prior Function Level of Independence: Independent         Comments: Patient was independent, attending curves 3x/week. Home layout obtained from family at bedside.     Hand Dominance        Extremity/Trunk Assessment   Upper Extremity Assessment Upper Extremity Assessment: Generalized weakness    Lower Extremity Assessment Lower Extremity Assessment: Generalized weakness       Communication   Communication: No difficulties  Cognition Arousal/Alertness: Awake/alert Behavior During Therapy: WFL for tasks assessed/performed Overall Cognitive Status: Within Functional Limits for tasks assessed  General Comments      Exercises     Assessment/Plan    PT Assessment Patient needs continued PT services  PT Problem List Decreased strength;Decreased activity tolerance;Decreased balance;Decreased mobility;Decreased knowledge of use of DME;Decreased safety awareness;Cardiopulmonary status limiting activity       PT Treatment Interventions DME instruction;Gait training;Stair training;Functional mobility  training;Therapeutic activities;Therapeutic exercise;Balance training;Patient/family education    PT Goals (Current goals can be found in the Care Plan section)  Acute Rehab PT Goals Patient Stated Goal: "To get stronger" PT Goal Formulation: With patient/family Time For Goal Achievement: 09/14/16 Potential to Achieve Goals: Good    Frequency Min 2X/week   Barriers to discharge Inaccessible home environment;Decreased caregiver support      Co-evaluation               AM-PAC PT "6 Clicks" Daily Activity  Outcome Measure Difficulty turning over in bed (including adjusting bedclothes, sheets and blankets)?: A Little Difficulty moving from lying on back to sitting on the side of the bed? : A Lot Difficulty sitting down on and standing up from a chair with arms (e.g., wheelchair, bedside commode, etc,.)?: A Lot Help needed moving to and from a bed to chair (including a wheelchair)?: Total Help needed walking in hospital room?: Total Help needed climbing 3-5 steps with a railing? : Total 6 Click Score: 10    End of Session Equipment Utilized During Treatment: Gait belt Activity Tolerance: Patient tolerated treatment well;Patient limited by fatigue Patient left: in bed;with call bell/phone within reach;with bed alarm set;with nursing/sitter in room;with family/visitor present;with SCD's reapplied Nurse Communication: Mobility status PT Visit Diagnosis: Unsteadiness on feet (R26.81);Muscle weakness (generalized) (M62.81);Difficulty in walking, not elsewhere classified (R26.2)    Time: 4098-1191 PT Time Calculation (min) (ACUTE ONLY): 43 min   Charges:   PT Evaluation $PT Eval Low Complexity: 1 Procedure PT Treatments $Therapeutic Activity: 8-22 mins   PT G Codes:        Neita Carp, PT, DPT 08/31/2016, 2:34 PM

## 2016-08-31 NOTE — Progress Notes (Signed)
Assessment and Plan: LUPJ stone with sepsis.   Left NT in good position with decompressed kidney.   Stone now in [employer].    Will need stone removal when recovered.  Could consider having IR place antegrade stent later this week.  Foley out but no voided urine reported.   Reinsert if needed for fluid management.   Subjective: [name] has no complaints of pain.   The foley was removed yesterday and thinks she voided but there is no urine output records from a void but about 90ml was recorded from the left NT from the last shift.    CT yesterday showed the NT in good position without hydro.  The stone now appears to be in the [employer].   There was a right abdominal wall fluid collection that is felt to be most consistent with a hematoma.   Her Cr is up to 2.46 since stopping CRRT.   ROS:  Review of Systems  Constitutional: Negative for chills and fever.  Gastrointestinal: Negative for abdominal pain and nausea.  Genitourinary: Negative for flank pain.    Anti-infectives: Anti-infectives    Start     Dose/Rate Route Frequency Ordered Stop   08/30/16 1000  vancomycin (VANCOCIN) IVPB 1000 mg/200 mL premix     1,000 mg 200 mL/hr over 60 Minutes Intravenous Every 24 hours 08/29/16 1238     08/29/16 0930  vancomycin (VANCOCIN) 1,750 mg in sodium chloride 0.9 % 500 mL IVPB     1,750 mg 250 mL/hr over 120 Minutes Intravenous  Once 08/29/16 0843 08/29/16 1153   08/28/16 1000  cefTRIAXone (ROCEPHIN) 2 g in dextrose 5 % 50 mL IVPB  Status:  Discontinued     2 g 100 mL/hr over 30 Minutes Intravenous Every 12 hours 08/28/16 0858 08/28/16 0915   08/28/16 1000  meropenem (MERREM) 1 g in sodium chloride 0.9 % 100 mL IVPB     1 g 200 mL/hr over 30 Minutes Intravenous Every 12 hours 08/28/16 0935     08/26/16 1100  cefTRIAXone (ROCEPHIN) 2 g in dextrose 5 % 50 mL IVPB  Status:  Discontinued     2 g 100 mL/hr over 30 Minutes Intravenous Every 24 hours 08/26/16 1013 08/28/16 0858   08/25/16 1800   azithromycin (ZITHROMAX) 500 mg in dextrose 5 % 250 mL IVPB  Status:  Discontinued     500 mg 250 mL/hr over 60 Minutes Intravenous Every 24 hours 08/24/16 1708 08/24/16 1947   08/24/16 2000  piperacillin-tazobactam (ZOSYN) IVPB 3.375 g  Status:  Discontinued     3.375 g 12.5 mL/hr over 240 Minutes Intravenous Every 12 hours 08/24/16 1404 08/24/16 1405   08/24/16 1600  piperacillin-tazobactam (ZOSYN) IVPB 3.375 g  Status:  Discontinued     3.375 g 12.5 mL/hr over 240 Minutes Intravenous Every 8 hours 08/24/16 1405 08/26/16 1013   08/24/16 1545  cefTRIAXone (ROCEPHIN) 1 g in dextrose 5 % 50 mL IVPB     1 g 100 mL/hr over 30 Minutes Intravenous  Once 08/24/16 1543 08/24/16 1656   08/24/16 1545  azithromycin (ZITHROMAX) 500 mg in dextrose 5 % 250 mL IVPB     500 mg 250 mL/hr over 60 Minutes Intravenous  Once 08/24/16 1543 08/24/16 1801   08/24/16 1415  vancomycin (VANCOCIN) IVPB 1000 mg/200 mL premix  Status:  Discontinued     1,000 mg 200 mL/hr over 60 Minutes Intravenous STAT 08/24/16 1403 08/24/16 1948   08/24/16 0915  vancomycin (VANCOCIN) IVPB 1000 mg/200  mL premix     1,000 mg 200 mL/hr over 60 Minutes Intravenous  Once 08/24/16 0910 08/24/16 1145   08/24/16 0915  piperacillin-tazobactam (ZOSYN) IVPB 3.375 g     3.375 g 100 mL/hr over 30 Minutes Intravenous  Once 08/24/16 0910 08/24/16 1145      Current Facility-Administered Medications  Medication Dose Route Frequency Provider Last Rate Last Dose  . acetaminophen (TYLENOL) tablet 650 mg  650 mg Per Tube Q6H PRN Merwyn Katos, MD      . albuterol (PROVENTIL) (2.5 MG/3ML) 0.083% nebulizer solution 2.5 mg  2.5 mg Nebulization Q2H PRN Shaune Pollack, MD      . bisacodyl (DULCOLAX) EC tablet 5 mg  5 mg Oral Daily PRN Shaune Pollack, MD      . feeding supplement (ENSURE ENLIVE) (ENSURE ENLIVE) liquid 237 mL  237 mL Oral TID BM Tukov, Magadalene S, NP      . fentaNYL (SUBLIMAZE) injection 50-100 mcg  50-100 mcg Intravenous Q1H PRN Marylou Flesher S, NP   100 mcg at 08/29/16 0602  . hydrALAZINE (APRESOLINE) injection 10 mg  10 mg Intravenous Q4H PRN Eugenie Norrie, NP   10 mg at 08/30/16 0531  . hydrocortisone sodium succinate (SOLU-CORTEF) 100 MG injection 25 mg  25 mg Intravenous Q8H Merwyn Katos, MD   25 mg at 08/31/16 0408  . insulin aspart (novoLOG) injection 0-20 Units  0-20 Units Subcutaneous Q4H Varughese, Bincy S, NP   3 Units at 08/31/16 0803  . insulin glargine (LANTUS) injection 10 Units  10 Units Subcutaneous BID Merwyn Katos, MD   10 Units at 08/30/16 2229  . MEDLINE mouth rinse  15 mL Mouth Rinse q12n4p Conforti, Clevester Helzer, DO   15 mL at 08/30/16 1552  . meropenem (MERREM) 1 g in sodium chloride 0.9 % 100 mL IVPB  1 g Intravenous Q12H Valentina Gu, RPH   Stopped at 08/31/16 0008  . midazolam (VERSED) injection 2 mg  2 mg Intravenous Q1H PRN Merwyn Katos, MD   2 mg at 08/27/16 1824  . ondansetron (ZOFRAN) tablet 4 mg  4 mg Oral Q6H PRN Shaune Pollack, MD       Or  . ondansetron [facility]) injection 4 mg  4 mg Intravenous Q6H PRN Shaune Pollack, MD      . pantoprazole sodium (PROTONIX) 40 mg/20 mL oral suspension 40 mg  40 mg Per Tube Q1200 Merwyn Katos, MD   40 mg at 08/30/16 1150  . senna-docusate (Senokot-S) tablet 1 tablet  1 tablet Oral QHS PRN Shaune Pollack, MD      . sodium chloride flush (NS) 0.9 % injection 10-40 mL  10-40 mL Intracatheter Q12H Milagros Loll, MD   30 mL at 08/30/16 2232  . sodium chloride flush (NS) 0.9 % injection 10-40 mL  10-40 mL Intracatheter PRN Sudini, Wardell Heath, MD      . vancomycin (VANCOCIN) IVPB 1000 mg/200 mL premix  1,000 mg Intravenous Q24H Valentina Gu, [facility]   Stopped at 08/30/16 1156     Objective: Vital signs in last 24 hours: Temp:  [97.5 F (36.4 C)-99.1 F (37.3 C)] 97.5 F (36.4 C) (07/08 0700) Pulse Rate:  [86-110] 86 (07/08 0800) Resp:  [16-27] 23 (07/08 0800) BP: (104-158)/(53-96) 134/83 (07/08 0800) SpO2:  [86 %-97 %] 92 % (07/08 0800) Weight:  [94 kg  (207 lb 3.7 oz)] 94 kg (207 lb 3.7 oz) (07/08 0340)  Intake/Output from previous day: 07/07 0701 - 07/08  0700 In: 2112.5 [P.O.:150; Blood:632.5; IV Piggyback:900] Out: 842 [Urine:500; Stool:100] Intake/Output this shift: No intake/output data recorded.   Physical Exam  Constitutional: She is well-developed, well-nourished, and in no distress.  Cardiovascular: Normal rate and regular rhythm.   Pulmonary/Chest: No respiratory distress.  Abdominal: Soft. There is no tenderness.  Vitals reviewed.   Lab Results:   Recent Labs  08/30/16 1201 08/31/16 0421  WBC 51.6* 46.9*  HGB 8.6* 8.2*  HCT 26.6* 24.5*  PLT 68* 79*   BMET  Recent Labs  08/30/16 0343 08/31/16 0421  NA 139 139  K 4.5 4.3  CL 106 107  CO2 27 27  GLUCOSE 139* 142*  BUN 39* 56*  CREATININE 1.54* 2.46*  CALCIUM 8.5* 8.2*   PT/INR  Recent Labs  08/30/16 1201 08/31/16 0753  LABPROT 17.7* 16.5*  INR 1.44 1.32   ABG  Recent Labs  08/29/16 0840  PHART 7.39  HCO3 29.7*    Studies/Results: Ct Abdomen Pelvis Wo Contrast  Result Date: 08/30/2016 CLINICAL DATA:  Left UPJ obstruction with Klebsiella sepsis. EXAM: CT ABDOMEN AND PELVIS WITHOUT CONTRAST TECHNIQUE: Multidetector CT imaging of the abdomen and pelvis was performed following the standard protocol without IV contrast. COMPARISON:  08/24/2016 FINDINGS: Lower chest: Interval progression of bilateral lower lobe collapse/ consolidation with slight increase in small bilateral pleural effusions. Hepatobiliary: Nodular contour of the liver suggest cirrhosis. Layering tiny calcified gallstones evident. There may be a stone in the cystic duct or common duct (see image 25 series 2). No intrahepatic or extrahepatic biliary dilation. Pancreas: No focal mass lesion. No dilatation of the main duct. No intraparenchymal cyst. No peripancreatic edema. Spleen: No splenomegaly. No focal mass lesion. Adrenals/Urinary Tract: No adrenal nodule or mass. Right kidney and  ureter are unremarkable. Status post left percutaneous nephrostomy tube placement with no evidence for residual left hydronephrosis. 6 mm right UPJ stone seen on the prior study is no longer evident although a 6 mm stone is identified in the renal pelvis adjacent to the formed loop of the percutaneous nephrostomy tube. Another 6 mm stone is identified in the lower pole the left kidney. Both of these stones are well seen on coronal image 58 of series 5. No left ureteral stone. Foley catheter decompresses the urinary bladder. Stomach/Bowel: Stomach is nondistended. No gastric wall thickening. No evidence of outlet obstruction. Duodenum is normally positioned as is the ligament of Treitz. No small bowel wall thickening. No small bowel dilatation. The terminal ileum is normal. The appendix is not visualized, but there is no edema or inflammation in the region of the cecum. Rectal tube noted. Vascular/Lymphatic: There is abdominal aortic atherosclerosis without aneurysm. There is no gastrohepatic or hepatoduodenal ligament lymphadenopathy. No intraperitoneal or retroperitoneal lymphadenopathy. Recanalization of the paraumbilical vein is evident. No pelvic sidewall lymphadenopathy. Reproductive: Uterus surgically absent.  There is no adnexal mass. Other: Small volume intraperitoneal free fluid. Diffuse body wall edema noted. Musculoskeletal: Bone windows reveal no worrisome lytic or sclerotic osseous lesions. Intramuscular hematoma may be present in the right lateral abdominal wall musculature (see image 37 series 2). IMPRESSION: 1. Interval placement of left percutaneous nephrostomy tube with no evidence for left hydronephrosis on today's exam. The UPJ stone seen on the previous study is no longer evident although this stone may now be in the central renal pelvis. Another 6 mm stone is identified in the lower pole left kidney. 2. Cholelithiasis with tiny stone in the porta hepatis, potentially in the cystic duct or  common duct.  3. Cirrhotic changes in the liver with recanalization of the paraumbilical vein suggesting portal venous hypertension. 4. Small volume intraperitoneal free fluid with diffuse body wall edema. Focal fluid/edema attenuation in the right lateral wall abdominal musculature raises the question of hematoma. 5. Interval increase in bibasilar collapse/consolidation with small bilateral pleural effusions. 6.  Aortic Atherosclerois (ICD10-170.0) Electronically Signed   By: Kennith CenterEric  Mansell M.D.   On: 08/30/2016 15:01   Dg Chest Port 1 View  Result Date: 08/30/2016 CLINICAL DATA:  Initial evaluation for acute respiratory failure. EXAM: PORTABLE CHEST 1 VIEW COMPARISON:  Prior radiograph from 08/28/2016. FINDINGS: Interval removal of endotracheal and enteric tubes. Right IJ approach central venous catheter remains in place with tip overlying the cavoatrial junction. Stable cardiomegaly. Mediastinal silhouette unchanged. Lungs are hypoinflated. Persistent pulmonary vascular congestion without frank pulmonary edema. Persistent left pleural effusion, with decreased right pleural effusion. Probable superimposed bibasilar atelectasis. No definite focal infiltrates. No pneumothorax. No acute osseus abnormality. IMPRESSION: 1. Interval removal of endotracheal and enteric tubes. Stable right IJ central venous catheter. 2. Stable cardiomegaly with mild CHF, slightly improved from previous. Persistent small bilateral pleural effusions. 3. Bibasilar atelectasis. Electronically Signed   By: Rise MuBenjamin  McClintock M.D.   On: 08/30/2016 06:11   CT films and labs reviewed.         LOS: 7 days    Anner CreteWRENN,Teresa Nicodemus J 08/31/2016 [phone] ID: Megan FerdinandSarah S Byard, female   DOB: 14-Jul-1950, 66 y.o.   MRN: [id number]

## 2016-08-31 NOTE — Progress Notes (Signed)
Nutrition Follow-up  DOCUMENTATION CODES:   Obesity unspecified  INTERVENTION:  Will continue to monitor patient's appetite and intake on current diet. She may benefit from liberalization of diet if not able to meet needs.  Continue Ensure Enlive po TID, each supplement provides 350 kcal and 20 grams of protein.   Discussed with patient and husband importance of obtaining adequate calories and protein from diet to prevent loss of lean body mass. Discussed foods patient enjoys and what is available on menu here. Encouraged regular intake, even in absence of feeling hunger.  NUTRITION DIAGNOSIS:   Inadequate oral intake related to poor appetite as evidenced by per patient/family report.  Ongoing.  GOAL:   Patient will meet greater than or equal to 90% of their needs  Progressing.  MONITOR:   PO intake, Supplement acceptance, Labs, Weight trends, I & O's  REASON FOR ASSESSMENT:   Consult Assessment of nutrition requirement/status (and Poor PO)  ASSESSMENT:   66 y.o. black female with diabetes mellitus type II, hypertension, history of CVA, who was admitted to [facility] on 08/24/2016 for hydronephrosis, acute renal failure with metabolic acidosis and septic shock secondary to left kidney stone. Pt intubated r/t acute respiratory failure and now s/p nephrostomy tube placement 7/1.    -Off all vasopressors on 7/5.  -Pt s/p extubation on 7/6. Tube feeds were stopped and OGT was removed. -CVVHD was discontinued on 7/7. May transition to intermittent HD if renal function does not improve. Dialysis catheter being removed today (7/8) d/t leukocytosis.  -Pt has had mottling of both great toes, left third toe, right thumb, and patchy areas of right palm. 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 May require amputation of toes, but no indication for emergent operation at this time.  -Following SLP evaluation on 7/7 pt was able to be advanced to  regular texture diet with thin liquids. Was placed on renal/carbohydrate modified diet.  Spoke with patient and her husband at bedside. Patient reports she has a decreased appetite now and is not feeling hungry. This morning for breakfast she had her grapes and then a small piece of her toast with jelly on it. She did drink 50% of a strawberry Ensure Enlive. Reports that last night she had a piece of [place], and some grapes. Patient enjoys the Ensure and would like to keep drinking it. She believes her appetite is slowly improving. Per chart patient ate 75% of her lunch today. She denies any N/V, abdominal pain, difficulty chewing/swallowing. She is having diarrhea.   She reports that PTA she had a very good appetite. She would eat 3 meals per day. She enjoys boiled eggs, cabbage, chicken, [place], green beans, rice, and pasta. She reports her UBW is "170-something lbs." On admission pt was 189.2 lbs (85.8 kg). She had gained 18.5 kg up to 104.3 kg on 7/4, but has since trended down to 94 kg today with fluid removal from CVVHD.  Meal Completion: 0-75% In the past 24 hrs patient has had approximately 617 kcal (33% minimum estimated kcal needs) and 36 grams of protein (35% minimum estimated protein needs).   Medications reviewed and include: Novolog 0-20 units Q4hrs, Lantus 20 units daily, pantoprazole, meropenem, vancomycin.  Labs reviewed: CBG 74-203 past 24 hrs, BUN 56, Creatinine 2.46, EGFR 22. Potassium, Phosphorus, Magnesium all WNL.  500 ml UOP (0.2 ml/kg/hr) from 0700 6/7 to 0700 6/8  Discussed with RN.  Diet Order:  Diet renal/carb modified with  fluid restriction Diet-HS Snack? Nothing; Room service appropriate? Yes with Assist; Fluid consistency: Thin  Skin:  Wound (see comment) (cyanosis to toes, right thumb)  Last BM:  08/31/2016 - 25 ml of type 7 per rectal tube  Height:   Ht Readings from Last 1 Encounters:  08/24/16 '5\' 8"'$  (1.727 m)    Weight:   Wt Readings from Last 1  Encounters:  08/31/16 207 lb 3.7 oz (94 kg)    Ideal Body Weight:  63.6 kg  BMI:  Body mass index is 31.51 kg/m.  Estimated Nutritional Needs:   Kcal:  1880-2170 (MSJ x 1.3-1.5)  Protein:  103-129 grams (1.2-1.5 grams/kg)  Fluid:  UOP + 1 L  EDUCATION NEEDS:   Education needs addressed  Willey Blade, MS, RD, LDN Pager: [phone] After Hours Pager: [phone]

## 2016-08-31 NOTE — Progress Notes (Signed)
Patient resting in bed. External catheter (Purewick) placed at 11:00 pm. Bladder scan revealed 130 mls of urine.  No urine output at this time.  Will monitor.

## 2016-08-31 NOTE — Progress Notes (Signed)
[facility] - [place] [facility]   PATIENT NAME: Megan Nixon    MR#:  [id number]  DATE OF BIRTH:  [birth year]  SUBJECTIVE:   Extubated  Patient now off Pressors. CRRT off since 08/31/16 Urine output 585 cc Husband at bedside Answers basic questions well. Denies any pain.  REVIEW OF SYSTEMS:    Review of Systems  Constitutional: Negative for chills, fever and weight loss.  HENT: Negative for ear discharge, ear pain and nosebleeds.   Eyes: Negative for blurred vision, pain and discharge.  Respiratory: Negative for sputum production, shortness of breath, wheezing and stridor.   Cardiovascular: Negative for chest pain, palpitations, orthopnea and PND.  Gastrointestinal: Negative for abdominal pain, diarrhea, nausea and vomiting.  Genitourinary: Negative for frequency and urgency.  Musculoskeletal: Negative for back pain and joint pain.  Neurological: Positive for weakness. Negative for sensory change, speech change and focal weakness.  Psychiatric/Behavioral: Negative for depression and hallucinations. The patient is not nervous/anxious.    DRUG ALLERGIES:  No Known Allergies  VITALS:  Blood pressure 134/83, pulse 86, temperature (!) 97.5 F (36.4 C), resp. rate (!) 23, height 5\' 8"  (1.727 m), weight 94 kg (207 lb 3.7 oz), SpO2 92 %.  PHYSICAL EXAMINATION:   Physical Exam  GENERAL:  66 y.o.-year-old patient lying in the bed. Looks critically ill, Limited exam.Extubated EYES: Pupils equal, round, reactive to light HEENT: Head atraumatic, normocephalic. Oropharynx and nasopharynx clear. ET tube in place NECK:  Supple, no jugular venous distention. No thyroid enlargement, no tenderness.  LUNGS: Bilateral decreased breath sounds bases. No use of accessory muscles. CARDIOVASCULAR: S1, S2 normal. Tachycardia, no murmur ABDOMEN: Soft, nontender, nondistended. Bowel sounds present. No organomegaly or mass. Foley catheter in place EXTREMITIES: Patient does have  cyanosis/ischemia bilateral lower extremity left more than right 3+ pitting edema both lower extremities    Cerebrovascular system moves all extremities well. Generalized deconditioning. Nonfocal grossly.;   LABORATORY PANEL:   CBC  Recent Labs Lab 08/31/16 0421  WBC 46.9*  HGB 8.2*  HCT 24.5*  PLT 79*   ------------------------------------------------------------------------------------------------------------------ Chemistries   Recent Labs Lab 08/31/16 0421  NA 139  K 4.3  CL 107  CO2 27  GLUCOSE 142*  BUN 56*  CREATININE 2.46*  CALCIUM 8.2*  MG 1.9  AST 168*  ALT 73*  ALKPHOS 102  BILITOT 1.0   ------------------------------------------------------------------------------------------------------------------  Cardiac Enzymes  Recent Labs Lab 08/28/16 0420  TROPONINI 0.72*   ------------------------------------------------------------------------------------------------------------------  RADIOLOGY:  Ct Abdomen Pelvis Wo Contrast  Result Date: 08/30/2016 CLINICAL DATA:  Left UPJ obstruction with Klebsiella sepsis. EXAM: CT ABDOMEN AND PELVIS WITHOUT CONTRAST TECHNIQUE: Multidetector CT imaging of the abdomen and pelvis was performed following the standard protocol without IV contrast. COMPARISON:  08/24/2016 FINDINGS: Lower chest: Interval progression of bilateral lower lobe collapse/ consolidation with slight increase in small bilateral pleural effusions. Hepatobiliary: Nodular contour of the liver suggest cirrhosis. Layering tiny calcified gallstones evident. There may be a stone in the cystic duct or common duct (see image 25 series 2). No intrahepatic or extrahepatic biliary dilation. Pancreas: No focal mass lesion. No dilatation of the main duct. No intraparenchymal cyst. No peripancreatic edema. Spleen: No splenomegaly. No focal mass lesion. Adrenals/Urinary Tract: No adrenal nodule or mass. Right kidney and ureter are unremarkable. Status post left  percutaneous nephrostomy tube placement with no evidence for residual left hydronephrosis. 6 mm right UPJ stone seen on the prior study is no longer evident although a 6 mm stone is identified in  the renal pelvis adjacent to the formed loop of the percutaneous nephrostomy tube. Another 6 mm stone is identified in the lower pole the left kidney. Both of these stones are well seen on coronal image 58 of series 5. No left ureteral stone. Foley catheter decompresses the urinary bladder. Stomach/Bowel: Stomach is nondistended. No gastric wall thickening. No evidence of outlet obstruction. Duodenum is normally positioned as is the ligament of Treitz. No small bowel wall thickening. No small bowel dilatation. The terminal ileum is normal. The appendix is not visualized, but there is no edema or inflammation in the region of the cecum. Rectal tube noted. Vascular/Lymphatic: There is abdominal aortic atherosclerosis without aneurysm. There is no gastrohepatic or hepatoduodenal ligament lymphadenopathy. No intraperitoneal or retroperitoneal lymphadenopathy. Recanalization of the paraumbilical vein is evident. No pelvic sidewall lymphadenopathy. Reproductive: Uterus surgically absent.  There is no adnexal mass. Other: Small volume intraperitoneal free fluid. Diffuse body wall edema noted. Musculoskeletal: Bone windows reveal no worrisome lytic or sclerotic osseous lesions. Intramuscular hematoma may be present in the right lateral abdominal wall musculature (see image 37 series 2). IMPRESSION: 1. Interval placement of left percutaneous nephrostomy tube with no evidence for left hydronephrosis on today's exam. The UPJ stone seen on the previous study is no longer evident although this stone may now be in the central renal pelvis. Another 6 mm stone is identified in the lower pole left kidney. 2. Cholelithiasis with tiny stone in the porta hepatis, potentially in the cystic duct or common duct. 3. Cirrhotic changes in the liver  with recanalization of the paraumbilical vein suggesting portal venous hypertension. 4. Small volume intraperitoneal free fluid with diffuse body wall edema. Focal fluid/edema attenuation in the right lateral wall abdominal musculature raises the question of hematoma. 5. Interval increase in bibasilar collapse/consolidation with small bilateral pleural effusions. 6.  Aortic Atherosclerois (ICD10-170.0) Electronically Signed   By: Kennith Center M.D.   On: 08/30/2016 15:01   Dg Chest Port 1 View  Result Date: 08/30/2016 CLINICAL DATA:  Initial evaluation for acute respiratory failure. EXAM: PORTABLE CHEST 1 VIEW COMPARISON:  Prior radiograph from 08/28/2016. FINDINGS: Interval removal of endotracheal and enteric tubes. Right IJ approach central venous catheter remains in place with tip overlying the cavoatrial junction. Stable cardiomegaly. Mediastinal silhouette unchanged. Lungs are hypoinflated. Persistent pulmonary vascular congestion without frank pulmonary edema. Persistent left pleural effusion, with decreased right pleural effusion. Probable superimposed bibasilar atelectasis. No definite focal infiltrates. No pneumothorax. No acute osseus abnormality. IMPRESSION: 1. Interval removal of endotracheal and enteric tubes. Stable right IJ central venous catheter. 2. Stable cardiomegaly with mild CHF, slightly improved from previous. Persistent small bilateral pleural effusions. 3. Bibasilar atelectasis. Electronically Signed   By: Rise Mu M.D.   On: 08/30/2016 06:11     ASSESSMENT AND PLAN:   * Septic shockDue to Klebsiella bacteremia source urine Patient now extubated -Off IV pressors -White count up to 38,000---47,000--51,000---46K. No fever -CT abdomen shows no abcess - Spoke with Dr. Lonn Georgia. CT abdomen today. -On IV Solu-Cortef stress dose-- Now hemodynamically stable. Will DC Solu-Cortef  * Left pyelonephritis with UPJ calculus and hydro-nephrosisStatus post percutaneous  nephrostomy tube placement on admission  -urine culture positive for Klebsiella -Patient was on IV Rocephin----now on IV meropenem and vancomycin -Consider ID consultation. -CT abdomen did not show any any fluid collection/abscess.  * Acute kidney injury/pain unit secondary to septic shock ATN -On CRRT--- which is discontinued on 08/30/2016 -Dialysis catheter removed. Central line removed.  * ARDS with Acute  hypoxic respiratory failure --Status post mechanical ventilator now extubated   *Severe DIC due to #1 -Patient receiving platelets and FFP's according to her labs -Platelet count 79,000 much better. No active bleeding.  *Bilateral lower extremity ischemia more on the left than right in the setting of sepsis/DIC  -Vascular recommendations noted. Patient has good peripheral pedal pulses.  Patient is critically ill however improving slowly.  All the records are reviewed and case discussed with Care Management/Social Workerr. Management plans discussed with the patient, family and they are in agreement.  CODE STATUS: FULL CODE  DVT Prophylaxis: SCDs  TOTAL TIME TAKING CARE OF THIS PATIENT: 25 minutes.    Valin Massie M.D on 08/31/2016 at 10:30 AM  Between 7am to 6pm - Pager - [phone]  After 6pm go to [email or web address] - password [email or web address]  [facility] [place] [facility]  Office  [phone]  CC: Primary care physician; Danella PentonMiller, Mark F, MD  Note: This dictation was prepared with Dragon dictation along with smaller phrase technology. Any transcriptional errors that result from this process are unintentional.

## 2016-08-31 NOTE — Progress Notes (Signed)
Removed central line and dialysis catheter per Dr Lonn Georgiaonforti.

## 2016-08-31 NOTE — Progress Notes (Signed)
Attempted to sit patient up on edge of bed.  Patient unable to tolerate at this time. Very weak. Able to tolerate High Fowler's position.

## 2016-08-31 NOTE — Progress Notes (Signed)
[facility]  ROUNDING NOTE   Subjective:   Husband at bedside.  UOP 500  Off CRRT   Answering questions appropriately. States she has no pain. No complaints.   Wbc 46.9  Objective:  Vital signs in last 24 hours:  Temp:  [97.5 F (36.4 C)-99.1 F (37.3 C)] 97.5 F (36.4 C) (07/08 0700) Pulse Rate:  [86-110] 86 (07/08 0800) Resp:  [16-27] 23 (07/08 0800) BP: (104-158)/(53-96) 134/83 (07/08 0800) SpO2:  [86 %-97 %] 92 % (07/08 0800) Weight:  [94 kg (207 lb 3.7 oz)] 94 kg (207 lb 3.7 oz) (07/08 0340)  Weight change: -3.5 kg (-7 lb 11.5 oz) Filed Weights   08/30/16 0400 08/31/16 0000 08/31/16 0340  Weight: 97.5 kg (214 lb 15.2 oz) 94 kg (207 lb 3.7 oz) 94 kg (207 lb 3.7 oz)    Intake/Output: I/O last 3 completed shifts: In: 2307.5 [P.O.:150; I.V.:50; Blood:632.5; Other:475; IV Piggyback:1000] Out: 2910 [Urine:1085; WJXBJ:4782; Stool:250]   Intake/Output this shift:  No intake/output data recorded.  Physical Exam: General: Ill appearing  Head: [place]/AT  Eyes: Anicteric, PERRL  Neck:  trachea midline  Lungs:  Clear bilaterally   Heart: tachycardia  Abdomen:  Soft, nontender, obese  Extremities:  +cyanosis  Neurologic: Intubated, sedated  Skin: No lesions  Access: Left femoral temp HD catheter 7/2 Dr. Sung Amabile    Basic Metabolic Panel:  Recent Labs Lab 08/29/16 1013 08/29/16 1607 08/29/16 2246 08/30/16 0343 08/31/16 0421  NA 139 138 139 139 139  K 4.0 4.2 3.9 4.5 4.3  CL 105 105 107 106 107  CO2 28 27 27 27 27   GLUCOSE 176* 155* 115* 139* 142*  BUN 51* 46* 41* 39* 56*  CREATININE 1.85* 1.89* 1.69* 1.54* 2.46*  CALCIUM 8.4* 8.3* 8.2* 8.5* 8.2*  MG 1.8 1.8 1.7 1.7 1.9  PHOS 2.8 2.9 2.6 3.0 4.1    Liver Function Tests:  Recent Labs Lab 08/24/16 2115 08/25/16 0508  08/27/16 0505  08/28/16 0420  08/29/16 1013 08/29/16 1607 08/29/16 2246 08/30/16 0343 08/31/16 0421  AST 94* 139*  --  69*  --  111*  --   --   --   --   --  168*  ALT  43 48  --  55*  --  57*  --   --   --   --   --  73*  ALKPHOS 79 60  --  98  --  130*  --   --   --   --   --  102  BILITOT 1.0 0.9  --  0.7  --  0.6  --   --   --   --   --  1.0  PROT 5.8* 4.8*  --  5.5*  --  5.3*  --   --   --   --   --  5.3*  ALBUMIN 2.7* 2.3*  < > 2.2*  < > 2.0*  < > 2.3* 2.2* 2.1* 2.3* 2.2*  < > = values in this interval not displayed. No results for input(s): LIPASE, AMYLASE in the last 168 hours. No results for input(s): AMMONIA in the last 168 hours.  CBC:  Recent Labs Lab 08/26/16 0532 08/27/16 0620 08/28/16 0420 08/29/16 0345 08/29/16 2000 08/30/16 1201 08/31/16 0421  WBC 39.4* 31.3* 34.0* 38.8* 47.8* 51.6* 46.9*  NEUTROABS 35.0* 28.2* 30.7* 31.4*  --  44.9*  --   HGB 10.6* 9.9* 9.8* 8.8* 9.3* 8.6* 8.2*  HCT 32.0* 30.1* 29.8* 27.2*  29.2* 26.6* 24.5*  MCV 87.3 87.2 88.6 87.5 86.0 86.3 87.1  PLT 18* 18* 17* 27* 33* 68* 79*    Cardiac Enzymes:  Recent Labs Lab 08/24/16 1544 08/24/16 1600 08/24/16 2116 08/25/16 0508 08/28/16 0420  TROPONINI 0.13* 0.13* 0.77* 1.25* 0.72*    BNP: Invalid input(s): POCBNP  CBG:  Recent Labs Lab 08/30/16 1536 08/30/16 1954 08/31/16 0007 08/31/16 0400 08/31/16 0707  GLUCAP 130* 124* 117* 74 136*    Microbiology: Results for orders placed or performed during the hospital encounter of 08/24/16  Blood Culture (routine x 2)     Status: Abnormal   Collection Time: 08/24/16  8:25 AM  Result Value Ref Range Status   Specimen Description BLOOD RIGHT ANTECUBITAL  Final   Special Requests   Final    BOTTLES DRAWN AEROBIC AND ANAEROBIC Blood Culture adequate volume   Culture  Setup Time   Final    GRAM NEGATIVE RODS IN BOTH AEROBIC AND ANAEROBIC BOTTLES CRITICAL RESULT CALLED TO, READ BACK BY AND VERIFIED WITH: JASON ROBBINS ON 08/24/16 AT 1933 QSD    Culture (A)  Final    KLEBSIELLA PNEUMONIAE SUSCEPTIBILITIES PERFORMED ON PREVIOUS CULTURE WITHIN THE LAST 5 DAYS. Performed at [facility], [address] [place], [place] [address]    Report Status 08/27/2016 FINAL  Final  Blood Culture (routine x 2)     Status: Abnormal   Collection Time: 08/24/16  8:25 AM  Result Value Ref Range Status   Specimen Description BLOOD RIGHT ANTECUBITAL  Final   Special Requests   Final    BOTTLES DRAWN AEROBIC AND ANAEROBIC Blood Culture adequate volume   Culture  Setup Time   Final    GRAM NEGATIVE RODS IN BOTH AEROBIC AND ANAEROBIC BOTTLES CRITICAL RESULT CALLED TO, READ BACK BY AND VERIFIED WITH: JASON ROBBINS ON 08/24/16 AT 1933 QSD    Culture KLEBSIELLA PNEUMONIAE (A)  Final   Report Status 08/27/2016 FINAL  Final   Organism ID, Bacteria KLEBSIELLA PNEUMONIAE  Final      Susceptibility   Klebsiella pneumoniae - MIC*    AMPICILLIN >=32 RESISTANT Resistant     CEFAZOLIN <=4 SENSITIVE Sensitive     CEFEPIME <=1 SENSITIVE Sensitive     CEFTAZIDIME <=1 SENSITIVE Sensitive     CEFTRIAXONE <=1 SENSITIVE Sensitive     CIPROFLOXACIN <=0.25 SENSITIVE Sensitive     GENTAMICIN <=1 SENSITIVE Sensitive     IMIPENEM <=0.25 SENSITIVE Sensitive     TRIMETH/SULFA <=20 SENSITIVE Sensitive     AMPICILLIN/SULBACTAM 8 SENSITIVE Sensitive     PIP/TAZO <=4 SENSITIVE Sensitive     Extended ESBL NEGATIVE Sensitive     * KLEBSIELLA PNEUMONIAE  Blood Culture ID Panel (Reflexed)     Status: Abnormal   Collection Time: 08/24/16  8:25 AM  Result Value Ref Range Status   Enterococcus species NOT DETECTED NOT DETECTED Final   Listeria monocytogenes NOT DETECTED NOT DETECTED Final   Staphylococcus species NOT DETECTED NOT DETECTED Final   Staphylococcus aureus NOT DETECTED NOT DETECTED Final   Streptococcus species NOT DETECTED NOT DETECTED Final   Streptococcus agalactiae NOT DETECTED NOT DETECTED Final   Streptococcus pneumoniae NOT DETECTED NOT DETECTED Final   Streptococcus pyogenes NOT DETECTED NOT DETECTED Final   Acinetobacter baumannii NOT DETECTED NOT DETECTED Final   Enterobacteriaceae species DETECTED (A)  NOT DETECTED Final    Comment: Enterobacteriaceae represent a large family of gram-negative bacteria, not a single organism. CRITICAL RESULT CALLED TO, READ  BACK BY AND VERIFIED WITH: JASON ROBBINS ON 08/24/16 AT 1933 QSD    Enterobacter cloacae complex NOT DETECTED NOT DETECTED Final   Escherichia coli NOT DETECTED NOT DETECTED Final   Klebsiella oxytoca NOT DETECTED NOT DETECTED Final   Klebsiella pneumoniae DETECTED (A) NOT DETECTED Final    Comment: CRITICAL RESULT CALLED TO, READ BACK BY AND VERIFIED WITH: JASON ROBBINS ON 08/24/16 AT 1933 QSD    Proteus species NOT DETECTED NOT DETECTED Final   Serratia marcescens NOT DETECTED NOT DETECTED Final   Carbapenem resistance NOT DETECTED NOT DETECTED Final   Haemophilus influenzae NOT DETECTED NOT DETECTED Final   Neisseria meningitidis NOT DETECTED NOT DETECTED Final   Pseudomonas aeruginosa NOT DETECTED NOT DETECTED Final   Candida albicans NOT DETECTED NOT DETECTED Final   Candida glabrata NOT DETECTED NOT DETECTED Final   Candida krusei NOT DETECTED NOT DETECTED Final   Candida parapsilosis NOT DETECTED NOT DETECTED Final   Candida tropicalis NOT DETECTED NOT DETECTED Final  Urine culture     Status: Abnormal   Collection Time: 08/24/16  9:19 AM  Result Value Ref Range Status   Specimen Description URINE, CATHETERIZED  Final   Special Requests NONE  Final   Culture >=100,000 COLONIES/mL KLEBSIELLA PNEUMONIAE (A)  Final   Report Status 08/26/2016 FINAL  Final   Organism ID, Bacteria KLEBSIELLA PNEUMONIAE (A)  Final      Susceptibility   Klebsiella pneumoniae - MIC*    AMPICILLIN >=32 RESISTANT Resistant     CEFAZOLIN <=4 SENSITIVE Sensitive     CEFTRIAXONE <=1 SENSITIVE Sensitive     CIPROFLOXACIN <=0.25 SENSITIVE Sensitive     GENTAMICIN <=1 SENSITIVE Sensitive     IMIPENEM <=0.25 SENSITIVE Sensitive     NITROFURANTOIN 64 INTERMEDIATE Intermediate     TRIMETH/SULFA <=20 SENSITIVE Sensitive     AMPICILLIN/SULBACTAM 4 SENSITIVE  Sensitive     PIP/TAZO <=4 SENSITIVE Sensitive     Extended ESBL NEGATIVE Sensitive     * >=100,000 COLONIES/mL KLEBSIELLA PNEUMONIAE  Body fluid culture     Status: None   Collection Time: 08/24/16  3:30 PM  Result Value Ref Range Status   Specimen Description FLUID  Final   Special Requests LEFT NEPHROSTOMY  Final   Gram Stain   Final    WBC PRESENT, PREDOMINANTLY PMN GRAM NEGATIVE RODS CYTOSPIN SMEAR Performed at [facility], [address], [place], [place] [address]    Culture FEW KLEBSIELLA PNEUMONIAE  Final   Report Status 08/28/2016 FINAL  Final   Organism ID, Bacteria KLEBSIELLA PNEUMONIAE  Final      Susceptibility   Klebsiella pneumoniae - MIC*    AMPICILLIN >=32 RESISTANT Resistant     CEFAZOLIN <=4 SENSITIVE Sensitive     CEFEPIME <=1 SENSITIVE Sensitive     CEFTAZIDIME <=1 SENSITIVE Sensitive     CEFTRIAXONE <=1 SENSITIVE Sensitive     CIPROFLOXACIN <=0.25 SENSITIVE Sensitive     GENTAMICIN <=1 SENSITIVE Sensitive     IMIPENEM <=0.25 SENSITIVE Sensitive     TRIMETH/SULFA <=20 SENSITIVE Sensitive     AMPICILLIN/SULBACTAM 8 SENSITIVE Sensitive     PIP/TAZO <=4 SENSITIVE Sensitive     Extended ESBL NEGATIVE Sensitive     * FEW KLEBSIELLA PNEUMONIAE  MRSA PCR Screening     Status: None   Collection Time: 08/24/16  3:44 PM  Result Value Ref Range Status   MRSA by PCR NEGATIVE NEGATIVE Final    Comment:  The GeneXpert MRSA Assay (FDA approved for NASAL specimens only), is one component of a comprehensive MRSA colonization surveillance program. It is not intended to diagnose MRSA infection nor to guide or monitor treatment for MRSA infections.   Culture, respiratory (NON-Expectorated)     Status: None   Collection Time: 08/25/16  7:00 AM  Result Value Ref Range Status   Specimen Description TRACHEAL ASPIRATE  Final   Special Requests NONE  Final   Gram Stain   Final    FEW WBC PRESENT, PREDOMINANTLY PMN FEW SQUAMOUS EPITHELIAL CELLS  PRESENT NO ORGANISMS SEEN    Culture   Final    Consistent with normal respiratory flora. Performed at [facility], [address] [place], [place] [address]    Report Status 08/27/2016 FINAL  Final  C difficile quick scan w PCR reflex     Status: None   Collection Time: 08/26/16  8:05 PM  Result Value Ref Range Status   C Diff antigen NEGATIVE NEGATIVE Final   C Diff toxin NEGATIVE NEGATIVE Final   C Diff interpretation No C. difficile detected.  Final  Culture, blood (Routine X 2) w Reflex to ID Panel     Status: None (Preliminary result)   Collection Time: 08/30/16  9:03 AM  Result Value Ref Range Status   Specimen Description BLOOD LEFT ARM  Final   Special Requests   Final    BOTTLES DRAWN AEROBIC AND ANAEROBIC Blood Culture adequate volume   Culture NO GROWTH < 24 HOURS  Final   Report Status PENDING  Incomplete  Culture, blood (Routine X 2) w Reflex to ID Panel     Status: None (Preliminary result)   Collection Time: 08/30/16 10:18 AM  Result Value Ref Range Status   Specimen Description BLOOD LEFT ARM  Final   Special Requests   Final    BOTTLES DRAWN AEROBIC AND ANAEROBIC Blood Culture adequate volume   Culture NO GROWTH < 24 HOURS  Final   Report Status PENDING  Incomplete    Coagulation Studies:  Recent Labs  08/29/16 0345 08/30/16 1201 08/31/16 0753  LABPROT 15.9* 17.7* 16.5*  INR 1.26 1.44 1.32    Urinalysis: No results for input(s): COLORURINE, LABSPEC, PHURINE, GLUCOSEU, HGBUR, BILIRUBINUR, KETONESUR, PROTEINUR, UROBILINOGEN, NITRITE, LEUKOCYTESUR in the last 72 hours.  Invalid input(s): APPERANCEUR    Imaging: Ct Abdomen Pelvis Wo Contrast  Result Date: 08/30/2016 CLINICAL DATA:  Left UPJ obstruction with Klebsiella sepsis. EXAM: CT ABDOMEN AND PELVIS WITHOUT CONTRAST TECHNIQUE: Multidetector CT imaging of the abdomen and pelvis was performed following the standard protocol without IV contrast. COMPARISON:  08/24/2016 FINDINGS: Lower chest:  Interval progression of bilateral lower lobe collapse/ consolidation with slight increase in small bilateral pleural effusions. Hepatobiliary: Nodular contour of the liver suggest cirrhosis. Layering tiny calcified gallstones evident. There may be a stone in the cystic duct or common duct (see image 25 series 2). No intrahepatic or extrahepatic biliary dilation. Pancreas: No focal mass lesion. No dilatation of the main duct. No intraparenchymal cyst. No peripancreatic edema. Spleen: No splenomegaly. No focal mass lesion. Adrenals/Urinary Tract: No adrenal nodule or mass. Right kidney and ureter are unremarkable. Status post left percutaneous nephrostomy tube placement with no evidence for residual left hydronephrosis. 6 mm right UPJ stone seen on the prior study is no longer evident although a 6 mm stone is identified in the renal pelvis adjacent to the formed loop of the percutaneous nephrostomy tube. Another 6 mm stone is identified in  the lower pole the left kidney. Both of these stones are well seen on coronal image 58 of series 5. No left ureteral stone. Foley catheter decompresses the urinary bladder. Stomach/Bowel: Stomach is nondistended. No gastric wall thickening. No evidence of outlet obstruction. Duodenum is normally positioned as is the ligament of Treitz. No small bowel wall thickening. No small bowel dilatation. The terminal ileum is normal. The appendix is not visualized, but there is no edema or inflammation in the region of the cecum. Rectal tube noted. Vascular/Lymphatic: There is abdominal aortic atherosclerosis without aneurysm. There is no gastrohepatic or hepatoduodenal ligament lymphadenopathy. No intraperitoneal or retroperitoneal lymphadenopathy. Recanalization of the paraumbilical vein is evident. No pelvic sidewall lymphadenopathy. Reproductive: Uterus surgically absent.  There is no adnexal mass. Other: Small volume intraperitoneal free fluid. Diffuse body wall edema noted.  Musculoskeletal: Bone windows reveal no worrisome lytic or sclerotic osseous lesions. Intramuscular hematoma may be present in the right lateral abdominal wall musculature (see image 37 series 2). IMPRESSION: 1. Interval placement of left percutaneous nephrostomy tube with no evidence for left hydronephrosis on today's exam. The UPJ stone seen on the previous study is no longer evident although this stone may now be in the central renal pelvis. Another 6 mm stone is identified in the lower pole left kidney. 2. Cholelithiasis with tiny stone in the porta hepatis, potentially in the cystic duct or common duct. 3. Cirrhotic changes in the liver with recanalization of the paraumbilical vein suggesting portal venous hypertension. 4. Small volume intraperitoneal free fluid with diffuse body wall edema. Focal fluid/edema attenuation in the right lateral wall abdominal musculature raises the question of hematoma. 5. Interval increase in bibasilar collapse/consolidation with small bilateral pleural effusions. 6.  Aortic Atherosclerois (ICD10-170.0) Electronically Signed   By: Kennith Center M.D.   On: 08/30/2016 15:01   Dg Chest Port 1 View  Result Date: 08/30/2016 CLINICAL DATA:  Initial evaluation for acute respiratory failure. EXAM: PORTABLE CHEST 1 VIEW COMPARISON:  Prior radiograph from 08/28/2016. FINDINGS: Interval removal of endotracheal and enteric tubes. Right IJ approach central venous catheter remains in place with tip overlying the cavoatrial junction. Stable cardiomegaly. Mediastinal silhouette unchanged. Lungs are hypoinflated. Persistent pulmonary vascular congestion without frank pulmonary edema. Persistent left pleural effusion, with decreased right pleural effusion. Probable superimposed bibasilar atelectasis. No definite focal infiltrates. No pneumothorax. No acute osseus abnormality. IMPRESSION: 1. Interval removal of endotracheal and enteric tubes. Stable right IJ central venous catheter. 2. Stable  cardiomegaly with mild CHF, slightly improved from previous. Persistent small bilateral pleural effusions. 3. Bibasilar atelectasis. Electronically Signed   By: Rise Mu M.D.   On: 08/30/2016 06:11     Medications:   . meropenem (MERREM) IV Stopped (08/31/16 0008)  . vancomycin Stopped (08/30/16 1156)   . feeding supplement (ENSURE ENLIVE)  237 mL Oral TID BM  . hydrocortisone sod succinate (SOLU-CORTEF) inj  25 mg Intravenous Q8H  . insulin aspart  0-20 Units Subcutaneous Q4H  . insulin glargine  10 Units Subcutaneous BID  . mouth rinse  15 mL Mouth Rinse q12n4p  . pantoprazole sodium  40 mg Per Tube Q1200  . sodium chloride flush  10-40 mL Intracatheter Q12H   acetaminophen **OR** [DISCONTINUED] acetaminophen, albuterol, bisacodyl, fentaNYL (SUBLIMAZE) injection, hydrALAZINE, midazolam, ondansetron **OR** ondansetron (ZOFRAN) IV, senna-docusate, sodium chloride flush  Assessment/ Plan:  Ms. Megan Nixon is a 66 y.o. black female Ms. Megan Nixon is a 66 y.o. black female with diabetes mellitus type II, hypertension, history of  CVA, who was admitted to [facility] on 08/24/2016 for septic shock  1. Acute renal failure with metabolic acidosis: baseline creatinine of 0.7 on 06/12/2016.  Oliguric output.   Acute renal failure secondary obstructive uropathy, sepsis, hypotension and ATN.  - No acute indication for dialysis today. May transition to intermittent hemodialysis if no improving in renal function.  - Remove dialysis catheter due to leukocytosis. May need a new catheter after catheter free period.   2. Septic Shock: Klebsiella.  Afebrile. Leukocytosis  46.9 (51.6) (47.8) (38.8) (34) (31.3) (39.4).  Klebsiella Off vasopresors   - empiric vancomycin and meropenum.  - hydrocortisone - Appreciate ID and urology input  3. Anemia and thrombocytopenia with renal failure: status post FFP transfusion on 7/6    LOS: 7 Marybella Ethier 7/8/201810:00 AM

## 2016-08-31 NOTE — Progress Notes (Signed)
Bladder scanned patient 408 ml. Per Dr Wynelle LinkKolluru in and out cath patient.

## 2016-08-31 NOTE — Progress Notes (Signed)
Patient alert and oriented. Remains oliguric.  Bladder scan (+) for urine. NP aware. Patient remains on 2L nasal cannula. Poor appetite. Incentive spirometer at bedside for hourly use.  Nephrology tube flushed. No acute distress noted.  VSS.  Bath given and tolerated.

## 2016-09-01 DIAGNOSIS — N172 Acute kidney failure with medullary necrosis: Secondary | ICD-10-CM

## 2016-09-01 DIAGNOSIS — R4182 Altered mental status, unspecified: Secondary | ICD-10-CM

## 2016-09-01 DIAGNOSIS — N189 Chronic kidney disease, unspecified: Secondary | ICD-10-CM

## 2016-09-01 DIAGNOSIS — E119 Type 2 diabetes mellitus without complications: Secondary | ICD-10-CM

## 2016-09-01 DIAGNOSIS — D65 Disseminated intravascular coagulation [defibrination syndrome]: Secondary | ICD-10-CM

## 2016-09-01 LAB — CBC
HEMATOCRIT: 26.1 % — AB (ref 35.0–47.0)
Hemoglobin: 8.8 g/dL — ABNORMAL LOW (ref 12.0–16.0)
MCH: 29.2 pg (ref 26.0–34.0)
MCHC: 33.6 g/dL (ref 32.0–36.0)
MCV: 86.8 fL (ref 80.0–100.0)
PLATELETS: 93 10*3/uL — AB (ref 150–440)
RBC: 3.01 MIL/uL — ABNORMAL LOW (ref 3.80–5.20)
RDW: 16.9 % — AB (ref 11.5–14.5)
WBC: 41.1 10*3/uL — AB (ref 3.6–11.0)

## 2016-09-01 LAB — GLUCOSE, CAPILLARY
GLUCOSE-CAPILLARY: 116 mg/dL — AB (ref 65–99)
GLUCOSE-CAPILLARY: 125 mg/dL — AB (ref 65–99)
GLUCOSE-CAPILLARY: 146 mg/dL — AB (ref 65–99)
Glucose-Capillary: 105 mg/dL — ABNORMAL HIGH (ref 65–99)
Glucose-Capillary: 120 mg/dL — ABNORMAL HIGH (ref 65–99)
Glucose-Capillary: 125 mg/dL — ABNORMAL HIGH (ref 65–99)
Glucose-Capillary: 129 mg/dL — ABNORMAL HIGH (ref 65–99)
Glucose-Capillary: 146 mg/dL — ABNORMAL HIGH (ref 65–99)

## 2016-09-01 LAB — PROTIME-INR
INR: 1.35
PROTHROMBIN TIME: 16.8 s — AB (ref 11.4–15.2)

## 2016-09-01 LAB — RENAL FUNCTION PANEL
ALBUMIN: 2.4 g/dL — AB (ref 3.5–5.0)
Anion gap: 9 (ref 5–15)
BUN: 76 mg/dL — AB (ref 6–20)
CO2: 24 mmol/L (ref 22–32)
CREATININE: 3.56 mg/dL — AB (ref 0.44–1.00)
Calcium: 8.4 mg/dL — ABNORMAL LOW (ref 8.9–10.3)
Chloride: 105 mmol/L (ref 101–111)
GFR calc Af Amer: 14 mL/min — ABNORMAL LOW (ref >60)
GFR, EST NON AFRICAN AMERICAN: 12 mL/min — AB (ref >60)
GLUCOSE: 126 mg/dL — AB (ref 65–99)
PHOSPHORUS: 5.4 mg/dL — AB (ref 2.5–4.6)
POTASSIUM: 3.8 mmol/L (ref 3.5–5.1)
SODIUM: 138 mmol/L (ref 135–145)

## 2016-09-01 LAB — PHOSPHORUS: PHOSPHORUS: 5.5 mg/dL — AB (ref 2.5–4.6)

## 2016-09-01 LAB — APTT: aPTT: 31 seconds (ref 24–36)

## 2016-09-01 LAB — MAGNESIUM: Magnesium: 2.1 mg/dL (ref 1.7–2.4)

## 2016-09-01 MED ORDER — PANTOPRAZOLE SODIUM 40 MG PO TBEC
40.0000 mg | DELAYED_RELEASE_TABLET | Freq: Every day | ORAL | Status: DC
  Administered 2016-09-01 – 2016-09-05 (×5): 40 mg via ORAL
  Filled 2016-09-01 (×5): qty 1

## 2016-09-01 MED ORDER — CEFAZOLIN SODIUM-DEXTROSE 2-4 GM/100ML-% IV SOLN
2.0000 g | INTRAVENOUS | Status: DC
  Administered 2016-09-01: 2 g via INTRAVENOUS
  Filled 2016-09-01 (×2): qty 100

## 2016-09-01 NOTE — Progress Notes (Signed)
Inpatient Rehabilitation  Per PT request patient was screened by Fae PippinMelissa Adriann Thau for appropriateness for an Inpatient Acute Rehab consult.  At this time we are recommending orders for an OT evaluation as well as an Inpatient Rehab consult.  Texted paged MD to make aware; please order if you are agreeable.    Charlane FerrettiMelissa Yuritzy Zehring, M.A., CCC/SLP Admission Coordinator  [facility]  Cell [phone]

## 2016-09-01 NOTE — Progress Notes (Signed)
Physical Therapy Treatment Patient Details Name: Megan FerdinandSarah S Wanek MRN: [id number] DOB: 08/05/1950 Today's Date: 09/01/2016    History of Present Illness Patient is a 66 y.o. female admitted on 01 JUL with septic shock. Patient confused and found to have UTI and multifocal pneumonia. PMH includes HTN, DMII, kidney stones, and CVA.    PT Comments    Pt demonstrates improvement with bed mobility and sit to stand transfers on this date. She continues to report DOE during mobility however pt starts session on room air. She performed multiple sit to stand transfers with rolling walker. ModA+1 with +2 present for added safety. Cues required for safe hand placement and proper sequencing. Decreased bilateral LE strength noted with pt requiring increased time to perform. She becomes weak after standing for 15-20 seconds and legs buckle with patient descending back onto bed. Attempted ambulation however pt only able to take a few small shuffling steps to transfer to recliner. Halfway through transfer patient's legs buckle and therapist has to perform a max assist transfer onto recliner. She is able to participate with all supine exercises demonstrating excellent motivation. She was very independent prior to this admission and was actively exercising. She has great support from her family. Discussed the possibility of inpatient rehab following discharge and family is in agreement to investigate further. Order placed for inpatient rehab coordinator to review. OT evaluation also requested. Pt will benefit from PT services to address deficits in strength, balance, and mobility in order to return to full function at home.     Follow Up Recommendations  CIR     Equipment Recommendations  Rolling walker with 5" wheels    Recommendations for Other Services Rehab consult     Precautions / Restrictions Precautions Precautions: Fall Restrictions Weight Bearing Restrictions: No    Mobility  Bed  Mobility Overal bed mobility: Needs Assistance Bed Mobility: Supine to Sit     Supine to sit: Min assist     General bed mobility comments: Pt demonstrates improved strengtht today with bed mobility. HOB elevated and use of bed rails. Cues provided for sequencing and assist to scoot up toward EOB. Pt reports mild DOE following bed mobility. SaO2 drops to 88% on room air. HR increases to 118 bpm. 2L/min supplemental O2 applied via [place] to recover SaO2 and for patient comfort  Transfers Overall transfer level: Needs assistance Equipment used: Rolling walker (2 wheeled) Transfers: Sit to/from Stand Sit to Stand: +2 physical assistance;Mod assist         General transfer comment: Pt performed multiple sit to stand transfers with rolling walker. ModA+1 with +2 present for added safety. Cues required for safe hand placement and proper sequencing. Decreased bilateral LE strength noted with pt requiring increased time to perform. She becomes weak after standing for 15-20 seconds and legs buckle with patient descended back onto surface. Attempted ambulation however pt only able to take a few small shuffling steps to transfer to recliner. Halfway through transfer patient's legs buckle and therapist has to perform a max assist transfer onto recliner  Ambulation/Gait             General Gait Details: Unable/unsafe to attempt currently   Stairs            Wheelchair Mobility    Modified Rankin (Stroke Patients Only)       Balance Overall balance assessment: Needs assistance Sitting-balance support: Bilateral upper extremity supported Sitting balance-Leahy Scale: Fair     Standing balance support: Bilateral upper extremity  supported Standing balance-Leahy Scale: Fair Standing balance comment: Requires continual UE support on rolling walker. Balance primarily impaired by bilateral LE weakness                            Cognition Arousal/Alertness:  Awake/alert Behavior During Therapy: WFL for tasks assessed/performed Overall Cognitive Status: Within Functional Limits for tasks assessed                                 General Comments: Pt is a little slow to respond initially and when short of breath but otherwise is AOx4      Exercises General Exercises - Lower Extremity Ankle Circles/Pumps: AROM;Both;10 reps;Supine Quad Sets: Strengthening;Both;10 reps;Supine Gluteal Sets: Strengthening;Both;10 reps;Supine Heel Slides: Strengthening;Both;10 reps;Supine Hip ABduction/ADduction: Strengthening;Both;10 reps;Supine Straight Leg Raises: Strengthening;Both;10 reps;Supine    General Comments        Pertinent Vitals/Pain Pain Assessment: No/denies pain    Home Living                      Prior Function            PT Goals (current goals can now be found in the care plan section) Acute Rehab PT Goals Patient Stated Goal: "To get stronger" PT Goal Formulation: With patient/family Time For Goal Achievement: 09/14/16 Potential to Achieve Goals: Good Progress towards PT goals: Progressing toward goals    Frequency    Min 2X/week      PT Plan Discharge plan needs to be updated    Co-evaluation              AM-PAC PT "6 Clicks" Daily Activity  Outcome Measure  Difficulty turning over in bed (including adjusting bedclothes, sheets and blankets)?: A Little Difficulty moving from lying on back to sitting on the side of the bed? : Total Difficulty sitting down on and standing up from a chair with arms (e.g., wheelchair, bedside commode, etc,.)?: Total Help needed moving to and from a bed to chair (including a wheelchair)?: Total Help needed walking in hospital room?: Total Help needed climbing 3-5 steps with a railing? : Total 6 Click Score: 8    End of Session Equipment Utilized During Treatment: Gait belt Activity Tolerance: Patient tolerated treatment well;Patient limited by  fatigue Patient left: with call bell/phone within reach;with family/visitor present;in chair Nurse Communication: Mobility status;Other (comment) (Discussed safe transfer techniques to return to bed) PT Visit Diagnosis: Unsteadiness on feet (R26.81);Muscle weakness (generalized) (M62.81);Difficulty in walking, not elsewhere classified (R26.2)     Time: 1610-9604 PT Time Calculation (min) (ACUTE ONLY): 44 min  Charges:  $Therapeutic Exercise: 8-22 mins $Therapeutic Activity: 23-37 mins                    G Codes:  Functional Assessment Tool Used: AM-PAC 6 Clicks Basic Mobility Functional Limitation: Mobility: Walking and moving around Mobility: Walking and Moving Around Current Status (V4098): At least 80 percent but less than 100 percent impaired, limited or restricted Mobility: Walking and Moving Around Goal Status [phone]): At least 1 percent but less than 20 percent impaired, limited or restricted    Lynnea Maizes PT, DPT     Woodie Trusty 09/01/2016, 2:31 PM

## 2016-09-01 NOTE — Progress Notes (Signed)
PULMONARY / CRITICAL CARE MEDICINE   Name: Megan FerdinandSarah S Nixon MRN: [id number] DOB: 13-Aug-1950    ADMISSION DATE:  08/24/2016  PT PROFILE:   66 y.o. F with hx of DM, hypertension admitted with severe sepsis/septic shock due to pyelonephritis, left hydronephrosis, klebsiella bacteremia  MAJOR EVENTS/TEST RESULTS: 07/01 admitted to ICU via emergency department as above 07/01 CT renal stone study: 5.5 x 5.0 mm left UPJ calculus with high-grade obstructive findings 07/01 CTA chest: No PE, patchy bilateral infiltrates 07/01 urology consultation Griffin Dakin([facility]): PCN placement recommended. Recommended 14 day course of antibiotics for complicated UTI. "Can plan definitive stone treatment once infection has been treated (between 2 and 6 weeks post PCN placement in most cases)"  07/01 left PCN tube placed by IR 07/02 echocardiogram: LVEF 55-60% 07/02 nephrology consultation - CRRT ordered 07/03 decreasing vasopressor requirements. Remains on CRRT. Severe DIC persists 07/04 Off vasopressin. Low dose norepinephrine. Awakens but not F/C. DIC persists 07/05 off of all vasopressors. More awake and responsive. Follows commands intermittently. 07/05 renal ultrasound (to rule out perinephric abscess):   INDWELLING DEVICES:: ETT 07/01 >>  R IJ CVL 07/01 >>  R radial art line 07/01 >> 07/05 L femoral HD cath 07/02 >>   MICRO DATA: MRSA PCR 07/01 >> NEG Urine 07/01 >> Klebsiella Resp 07/01 >> NOF Blood 07/01 >> 2/2 Klebsiella  ANTIMICROBIALS:  Vanc 07/01 >> 07/01 Pip-tazo 07/01 >> 07/02 Ceftriaxone 07/02 >> 07/05 Meropenem 07/05 >>    SUBJECTIVE:  WBC elevated Alert and awake VS stable  Plan to transfer to gen med floor  VITAL SIGNS: BP (!) 151/84   Pulse 98   Temp 98.8 F (37.1 C) (Oral)   Resp (!) 28   Ht 5\' 8"  (1.727 m)   Wt 207 lb 3.7 oz (94 kg)   SpO2 93%   BMI 31.51 kg/m    INTAKE / OUTPUT: I/O last 3 completed shifts: In: 1600 [P.O.:510; Other:290; IV Piggyback:800] Out: 1250  [Urine:1100; Stool:150]  PHYSICAL EXAMINATION: General: Patient is extubated  Neuro: PERRLA, EOMI, MAEs HEENT: NCAT, sclerae white. Mild scleral edema Cardiovascular: Regular, no murmurs Lungs: No wheezes Abdomen: Soft, diminished bowel sounds, no masses Ext: Feet and hands are warmer, mottling of all 4 extremities improved, there is some superficial skin necrosis on tip of her right thumb and several toes on the left foot.   LABS:  BMET  Recent Labs Lab 08/31/16 0421 08/31/16 2144 09/01/16 0606  NA 139 140 138  K 4.3 3.5 3.8  CL 107 106 105  CO2 27 24 24   BUN 56* 73* 76*  CREATININE 2.46* 3.40* 3.56*  GLUCOSE 142* 126* 126*    Electrolytes  Recent Labs Lab 08/30/16 0343 08/31/16 0421 08/31/16 2144 09/01/16 0606  CALCIUM 8.5* 8.2* 8.6* 8.4*  MG 1.7 1.9  --  2.1  PHOS 3.0 4.1  --  5.4*  5.5*    CBC  Recent Labs Lab 08/31/16 0421 08/31/16 2144 09/01/16 0606  WBC 46.9* 47.1* 41.1*  HGB 8.2* 8.7* 8.8*  HCT 24.5* 26.8* 26.1*  PLT 79* 93* 93*    Coag's  Recent Labs Lab 08/31/16 0753 08/31/16 2144 09/01/16 0606  APTT 31 30 31   INR 1.32 1.30 1.35    Sepsis Markers  Recent Labs Lab 08/29/16 2000 08/30/16 0343 08/31/16 0421 08/31/16 2144  LATICACIDVEN  --   --   --  1.2  PROCALCITON 44.49 37.93 31.79  --     ABG  Recent Labs Lab 08/29/16 0840  PHART 7.39  PCO2ART 49*  PO2ART 69*    Liver Enzymes  Recent Labs Lab 08/27/16 0505  08/28/16 0420  08/30/16 0343 08/31/16 0421 09/01/16 0606  AST 69*  --  111*  --   --  168*  --   ALT 55*  --  57*  --   --  73*  --   ALKPHOS 98  --  130*  --   --  102  --   BILITOT 0.7  --  0.6  --   --  1.0  --   ALBUMIN 2.2*  < > 2.0*  < > 2.3* 2.2* 2.4*  < > = values in this interval not displayed.  Cardiac Enzymes  Recent Labs Lab 08/28/16 0420  TROPONINI 0.72*    Glucose  Recent Labs Lab 08/31/16 1555 08/31/16 2005 08/31/16 2352 09/01/16 0346 09/01/16 0456 09/01/16 0713   GLUCAP 108* 107* 146* 129* 125* 120*    CXR:  No significant change, low volumes, probable right effusion and possible left effusion with patchy airspace disease   ASSESSMENT / PLAN:  PULMONARY A: Status post extubation, doing well on nasal cannula from a pulmonary perspective  CARDIOVASCULAR A:  Septic shock - Has been successfully weaned off of pressors   RENAL A:  Renal failure. was on CRRT, being converted to intermittent as needed per nephrology   HEMATOLOGIC A:  Platelet count is now 79, status post transfusion. Bleeding had resolved, Foley catheter has subsequently been removed, INR was 1.44, PTT is 56. If any additional evidence of bleeding will give fresh frozen plasma  INFECTIOUS A:   L  Pyelonephritis/hydronephrosis, white count has decreased to 46.9, CT scan of the abdomen was performed, nephrostomy tube was flushed, kidneys decompressed on CT scan without any obvious fluid collection to be drained. Will try to get a PICC line today and remove central line. We'll continue antibiotic coverage per now, pro calcitonin is elevated so we'll continue antibiotic coverage. If white count increases may empirically add antifungal coverage pending infectious disease input. Foley catheter is also been removed  ENDOCRINE A:   DM 2, controlled Relative adrenal insufficiency P:   Cont Lantus/SSI - initiated 07/03 Decrease hydrocortisone to 25 mg q 12  Will sign off at this time Ok to transfer to gen med floor  Thera Basden Santiago Glad, M.D.  [facility]  Medical Director [facility] Medical Director [facility] Cardio-Pulmonary Department

## 2016-09-01 NOTE — Progress Notes (Signed)
Patient alert. Rectal tube removed. I/o cath done. Yellow urine. No cloudy/clots.  VSS.

## 2016-09-01 NOTE — Progress Notes (Signed)
[facility]  Daily Progress Note   Subjective  - * No surgery found *  More alert today, denies foot or hand pain  Objective Vitals:   09/01/16 1100 09/01/16 1200 09/01/16 1245 09/01/16 1550  BP: (!) 148/76 (!) 150/91 (!) 141/76 129/87  Pulse: (!) 101 (!) 109 (!) 108 91  Resp: 20 (!) 22 (!) 23 18  Temp:    98.3 F (36.8 C)  TempSrc:      SpO2: 96% 95% 94% 100%  Weight:      Height:        Intake/Output Summary (Last 24 hours) at 09/01/16 1754 Last data filed at 09/01/16 1500  Gross per 24 hour  Intake              505 ml  Output              760 ml  Net             -255 ml    PULM  Normal effort , no use of accessory muscles CV  No JVD, RRR Abd      No distended, nontender VASC  Right thumb appears better as does the right great toe,  Left forefoot is better overall the medial tip of the left great toe appear fixed as does the tip of the left third toe.  No evidence of cellulitis;  3+ edema both arms and feet  Laboratory CBC    Component Value Date/Time   WBC 41.1 (H) 09/01/2016 0606   HGB 8.8 (L) 09/01/2016 0606   HCT 26.1 (L) 09/01/2016 0606   PLT 93 (L) 09/01/2016 0606    BMET    Component Value Date/Time   NA 138 09/01/2016 0606   K 3.8 09/01/2016 0606   CL 105 09/01/2016 0606   CO2 24 09/01/2016 0606   GLUCOSE 126 (H) 09/01/2016 0606   BUN 76 (H) 09/01/2016 0606   CREATININE 3.56 (H) 09/01/2016 0606   CALCIUM 8.4 (L) 09/01/2016 0606   GFRNONAA 12 (L) 09/01/2016 0606   GFRAA 14 (L) 09/01/2016 0606    Assessment/Planning:  1.  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  At this time she continues to improve systemically and there is no urgent need for surgical intervention  2. DIC with thrombocytopenia:Continue supportive care  monitor platelet count which is significantly up compared to yesterday and is now in the 90's  3. Acute renal failure: holding CRRT as of now. We will continue to monitor BUN and creatinine avoid nephrotoxic drugs and dehydration as well as hypotension  4. Diabetes mellitus:Continue sliding scale  5. Klebsiella sepsis:Continue antibiotics as prescribed   Megan DredgeGregory Isobella Nixon  09/01/2016, 5:54 PM

## 2016-09-01 NOTE — Progress Notes (Signed)
[facility]  ROUNDING NOTE   Subjective:   Patient remains critically ill States she is able to tolerate clears. No N/V reported Nurse reports urinary retention. Bladder scan > 400 multiple shift requiring I/o cathterization tota UOP 1010 cc S Cr and BUN are slightly higher  Objective:  Vital signs in last 24 hours:  Temp:  [98.5 F (36.9 C)-99.1 F (37.3 C)] 98.8 F (37.1 C) (07/09 0200) Pulse Rate:  [96-113] 98 (07/09 0800) Resp:  [14-36] 28 (07/09 0800) BP: (113-167)/(61-109) 151/84 (07/09 0800) SpO2:  [89 %-100 %] 93 % (07/09 0800)  Weight change:  Filed Weights   08/30/16 0400 08/31/16 0000 08/31/16 0340  Weight: 97.5 kg (214 lb 15.2 oz) 94 kg (207 lb 3.7 oz) 94 kg (207 lb 3.7 oz)    Intake/Output: I/O last 3 completed shifts: In: 1600 [P.O.:510; Other:290; IV Piggyback:800] Out: 1250 [Urine:1100; Stool:150]   Intake/Output this shift:  No intake/output data recorded.  Physical Exam: General: Ill appearing  Head: [place]/AT  Eyes: Anicteric,  Moist mucus membranes  Neck:  supple  Lungs:  Clear bilaterally   Heart: tachycardia  Abdomen:  Soft, nontender, obese  Extremities: + dependent edema  Neurologic: Alert, able to answer questions  Skin: Purplish lesions over toes and some fingers         Basic Metabolic Panel:  Recent Labs Lab 08/29/16 1607 08/29/16 2246 08/30/16 0343 08/31/16 0421 08/31/16 2144 09/01/16 0606  NA 138 139 139 139 140 138  K 4.2 3.9 4.5 4.3 3.5 3.8  CL 105 107 106 107 106 105  CO2 27 27 27 27 24 24   GLUCOSE 155* 115* 139* 142* 126* 126*  BUN 46* 41* 39* 56* 73* 76*  CREATININE 1.89* 1.69* 1.54* 2.46* 3.40* 3.56*  CALCIUM 8.3* 8.2* 8.5* 8.2* 8.6* 8.4*  MG 1.8 1.7 1.7 1.9  --  2.1  PHOS 2.9 2.6 3.0 4.1  --  5.4*  5.5*    Liver Function Tests:  Recent Labs Lab 08/27/16 0505  08/28/16 0420  08/29/16 1607 08/29/16 2246 08/30/16 0343 08/31/16 0421 09/01/16 0606  AST 69*  --  111*  --   --   --   --  168*   --   ALT 55*  --  57*  --   --   --   --  73*  --   ALKPHOS 98  --  130*  --   --   --   --  102  --   BILITOT 0.7  --  0.6  --   --   --   --  1.0  --   PROT 5.5*  --  5.3*  --   --   --   --  5.3*  --   ALBUMIN 2.2*  < > 2.0*  < > 2.2* 2.1* 2.3* 2.2* 2.4*  < > = values in this interval not displayed. No results for input(s): LIPASE, AMYLASE in the last 168 hours. No results for input(s): AMMONIA in the last 168 hours.  CBC:  Recent Labs Lab 08/26/16 0532 08/27/16 [id number] 08/28/16 0420 08/29/16 0345 08/29/16 2000 08/30/16 1201 08/31/16 0421 08/31/16 2144 09/01/16 0606  WBC 39.4* 31.3* 34.0* 38.8* 47.8* 51.6* 46.9* 47.1* 41.1*  NEUTROABS 35.0* 28.2* 30.7* 31.4*  --  44.9*  --   --   --   HGB 10.6* 9.9* 9.8* 8.8* 9.3* 8.6* 8.2* 8.7* 8.8*  HCT 32.0* 30.1* 29.8* 27.2* 29.2* 26.6* 24.5* 26.8* 26.1*  MCV 87.3  87.2 88.6 87.5 86.0 86.3 87.1 85.1 86.8  PLT 18* 18* 17* 27* 33* 68* 79* 93* 93*    Cardiac Enzymes:  Recent Labs Lab 08/28/16 0420  TROPONINI 0.72*    BNP: Invalid input(s): POCBNP  CBG:  Recent Labs Lab 08/31/16 2005 08/31/16 2352 09/01/16 0346 09/01/16 0456 09/01/16 0713  GLUCAP 107* 146* 129* 125* 120*    Microbiology: Results for orders placed or performed during the hospital encounter of 08/24/16  Blood Culture (routine x 2)     Status: Abnormal   Collection Time: 08/24/16  8:25 AM  Result Value Ref Range Status   Specimen Description BLOOD RIGHT ANTECUBITAL  Final   Special Requests   Final    BOTTLES DRAWN AEROBIC AND ANAEROBIC Blood Culture adequate volume   Culture  Setup Time   Final    GRAM NEGATIVE RODS IN BOTH AEROBIC AND ANAEROBIC BOTTLES CRITICAL RESULT CALLED TO, READ BACK BY AND VERIFIED WITH: JASON ROBBINS ON 08/24/16 AT 1933 QSD    Culture (A)  Final    KLEBSIELLA PNEUMONIAE SUSCEPTIBILITIES PERFORMED ON PREVIOUS CULTURE WITHIN THE LAST 5 DAYS. Performed at [facility], [address], [place], [place] [address]    Report  Status 08/27/2016 FINAL  Final  Blood Culture (routine x 2)     Status: Abnormal   Collection Time: 08/24/16  8:25 AM  Result Value Ref Range Status   Specimen Description BLOOD RIGHT ANTECUBITAL  Final   Special Requests   Final    BOTTLES DRAWN AEROBIC AND ANAEROBIC Blood Culture adequate volume   Culture  Setup Time   Final    GRAM NEGATIVE RODS IN BOTH AEROBIC AND ANAEROBIC BOTTLES CRITICAL RESULT CALLED TO, READ BACK BY AND VERIFIED WITH: JASON ROBBINS ON 08/24/16 AT 1933 QSD    Culture KLEBSIELLA PNEUMONIAE (A)  Final   Report Status 08/27/2016 FINAL  Final   Organism ID, Bacteria KLEBSIELLA PNEUMONIAE  Final      Susceptibility   Klebsiella pneumoniae - MIC*    AMPICILLIN >=32 RESISTANT Resistant     CEFAZOLIN <=4 SENSITIVE Sensitive     CEFEPIME <=1 SENSITIVE Sensitive     CEFTAZIDIME <=1 SENSITIVE Sensitive     CEFTRIAXONE <=1 SENSITIVE Sensitive     CIPROFLOXACIN <=0.25 SENSITIVE Sensitive     GENTAMICIN <=1 SENSITIVE Sensitive     IMIPENEM <=0.25 SENSITIVE Sensitive     TRIMETH/SULFA <=20 SENSITIVE Sensitive     AMPICILLIN/SULBACTAM 8 SENSITIVE Sensitive     PIP/TAZO <=4 SENSITIVE Sensitive     Extended ESBL NEGATIVE Sensitive     * KLEBSIELLA PNEUMONIAE  Blood Culture ID Panel (Reflexed)     Status: Abnormal   Collection Time: 08/24/16  8:25 AM  Result Value Ref Range Status   Enterococcus species NOT DETECTED NOT DETECTED Final   Listeria monocytogenes NOT DETECTED NOT DETECTED Final   Staphylococcus species NOT DETECTED NOT DETECTED Final   Staphylococcus aureus NOT DETECTED NOT DETECTED Final   Streptococcus species NOT DETECTED NOT DETECTED Final   Streptococcus agalactiae NOT DETECTED NOT DETECTED Final   Streptococcus pneumoniae NOT DETECTED NOT DETECTED Final   Streptococcus pyogenes NOT DETECTED NOT DETECTED Final   Acinetobacter baumannii NOT DETECTED NOT DETECTED Final   Enterobacteriaceae species DETECTED (A) NOT DETECTED Final    Comment:  Enterobacteriaceae represent a large family of gram-negative bacteria, not a single organism. CRITICAL RESULT CALLED TO, READ BACK BY AND VERIFIED WITH: JASON ROBBINS ON 08/24/16 AT 1933 QSD  Enterobacter cloacae complex NOT DETECTED NOT DETECTED Final   Escherichia coli NOT DETECTED NOT DETECTED Final   Klebsiella oxytoca NOT DETECTED NOT DETECTED Final   Klebsiella pneumoniae DETECTED (A) NOT DETECTED Final    Comment: CRITICAL RESULT CALLED TO, READ BACK BY AND VERIFIED WITH: JASON ROBBINS ON 08/24/16 AT 1933 QSD    Proteus species NOT DETECTED NOT DETECTED Final   Serratia marcescens NOT DETECTED NOT DETECTED Final   Carbapenem resistance NOT DETECTED NOT DETECTED Final   Haemophilus influenzae NOT DETECTED NOT DETECTED Final   Neisseria meningitidis NOT DETECTED NOT DETECTED Final   Pseudomonas aeruginosa NOT DETECTED NOT DETECTED Final   Candida albicans NOT DETECTED NOT DETECTED Final   Candida glabrata NOT DETECTED NOT DETECTED Final   Candida krusei NOT DETECTED NOT DETECTED Final   Candida parapsilosis NOT DETECTED NOT DETECTED Final   Candida tropicalis NOT DETECTED NOT DETECTED Final  Urine culture     Status: Abnormal   Collection Time: 08/24/16  9:19 AM  Result Value Ref Range Status   Specimen Description URINE, CATHETERIZED  Final   Special Requests NONE  Final   Culture >=100,000 COLONIES/mL KLEBSIELLA PNEUMONIAE (A)  Final   Report Status 08/26/2016 FINAL  Final   Organism ID, Bacteria KLEBSIELLA PNEUMONIAE (A)  Final      Susceptibility   Klebsiella pneumoniae - MIC*    AMPICILLIN >=32 RESISTANT Resistant     CEFAZOLIN <=4 SENSITIVE Sensitive     CEFTRIAXONE <=1 SENSITIVE Sensitive     CIPROFLOXACIN <=0.25 SENSITIVE Sensitive     GENTAMICIN <=1 SENSITIVE Sensitive     IMIPENEM <=0.25 SENSITIVE Sensitive     NITROFURANTOIN 64 INTERMEDIATE Intermediate     TRIMETH/SULFA <=20 SENSITIVE Sensitive     AMPICILLIN/SULBACTAM 4 SENSITIVE Sensitive     PIP/TAZO <=4  SENSITIVE Sensitive     Extended ESBL NEGATIVE Sensitive     * >=100,000 COLONIES/mL KLEBSIELLA PNEUMONIAE  Body fluid culture     Status: None   Collection Time: 08/24/16  3:30 PM  Result Value Ref Range Status   Specimen Description FLUID  Final   Special Requests LEFT NEPHROSTOMY  Final   Gram Stain   Final    WBC PRESENT, PREDOMINANTLY PMN GRAM NEGATIVE RODS CYTOSPIN SMEAR Performed at [facility], [address], [place], [place] [address]    Culture FEW KLEBSIELLA PNEUMONIAE  Final   Report Status 08/28/2016 FINAL  Final   Organism ID, Bacteria KLEBSIELLA PNEUMONIAE  Final      Susceptibility   Klebsiella pneumoniae - MIC*    AMPICILLIN >=32 RESISTANT Resistant     CEFAZOLIN <=4 SENSITIVE Sensitive     CEFEPIME <=1 SENSITIVE Sensitive     CEFTAZIDIME <=1 SENSITIVE Sensitive     CEFTRIAXONE <=1 SENSITIVE Sensitive     CIPROFLOXACIN <=0.25 SENSITIVE Sensitive     GENTAMICIN <=1 SENSITIVE Sensitive     IMIPENEM <=0.25 SENSITIVE Sensitive     TRIMETH/SULFA <=20 SENSITIVE Sensitive     AMPICILLIN/SULBACTAM 8 SENSITIVE Sensitive     PIP/TAZO <=4 SENSITIVE Sensitive     Extended ESBL NEGATIVE Sensitive     * FEW KLEBSIELLA PNEUMONIAE  MRSA PCR Screening     Status: None   Collection Time: 08/24/16  3:44 PM  Result Value Ref Range Status   MRSA by PCR NEGATIVE NEGATIVE Final    Comment:        The GeneXpert MRSA Assay (FDA approved for NASAL specimens only), is one component of  a comprehensive MRSA colonization surveillance program. It is not intended to diagnose MRSA infection nor to guide or monitor treatment for MRSA infections.   Culture, respiratory (NON-Expectorated)     Status: None   Collection Time: 08/25/16  7:00 AM  Result Value Ref Range Status   Specimen Description TRACHEAL ASPIRATE  Final   Special Requests NONE  Final   Gram Stain   Final    FEW WBC PRESENT, PREDOMINANTLY PMN FEW SQUAMOUS EPITHELIAL CELLS PRESENT NO ORGANISMS SEEN     Culture   Final    Consistent with normal respiratory flora. Performed at [facility], [address] [place], [place] [address]    Report Status 08/27/2016 FINAL  Final  C difficile quick scan w PCR reflex     Status: None   Collection Time: 08/26/16  8:05 PM  Result Value Ref Range Status   C Diff antigen NEGATIVE NEGATIVE Final   C Diff toxin NEGATIVE NEGATIVE Final   C Diff interpretation No C. difficile detected.  Final  Culture, blood (Routine X 2) w Reflex to ID Panel     Status: None (Preliminary result)   Collection Time: 08/30/16  9:03 AM  Result Value Ref Range Status   Specimen Description BLOOD LEFT ARM  Final   Special Requests   Final    BOTTLES DRAWN AEROBIC AND ANAEROBIC Blood Culture adequate volume   Culture NO GROWTH < 24 HOURS  Final   Report Status PENDING  Incomplete  Culture, blood (Routine X 2) w Reflex to ID Panel     Status: None (Preliminary result)   Collection Time: 08/30/16 10:18 AM  Result Value Ref Range Status   Specimen Description BLOOD LEFT ARM  Final   Special Requests   Final    BOTTLES DRAWN AEROBIC AND ANAEROBIC Blood Culture adequate volume   Culture NO GROWTH < 24 HOURS  Final   Report Status PENDING  Incomplete  Urine Culture     Status: None   Collection Time: 08/30/16 12:01 PM  Result Value Ref Range Status   Specimen Description URINE, RANDOM  Final   Special Requests NONE  Final   Culture   Final    NO GROWTH Performed at [facility], [address], [place], [place] [address]    Report Status 08/31/2016 FINAL  Final    Coagulation Studies:  Recent Labs  08/30/16 1201 08/31/16 0753 08/31/16 2144 09/01/16 0606  LABPROT 17.7* 16.5* 16.3* 16.8*  INR 1.44 1.32 1.30 1.35    Urinalysis: No results for input(s): COLORURINE, LABSPEC, PHURINE, GLUCOSEU, HGBUR, BILIRUBINUR, KETONESUR, PROTEINUR, UROBILINOGEN, NITRITE, LEUKOCYTESUR in the last 72 hours.  Invalid input(s): APPERANCEUR    Imaging: Ct Abdomen  Pelvis Wo Contrast  Result Date: 08/30/2016 CLINICAL DATA:  Left UPJ obstruction with Klebsiella sepsis. EXAM: CT ABDOMEN AND PELVIS WITHOUT CONTRAST TECHNIQUE: Multidetector CT imaging of the abdomen and pelvis was performed following the standard protocol without IV contrast. COMPARISON:  08/24/2016 FINDINGS: Lower chest: Interval progression of bilateral lower lobe collapse/ consolidation with slight increase in small bilateral pleural effusions. Hepatobiliary: Nodular contour of the liver suggest cirrhosis. Layering tiny calcified gallstones evident. There may be a stone in the cystic duct or common duct (see image 25 series 2). No intrahepatic or extrahepatic biliary dilation. Pancreas: No focal mass lesion. No dilatation of the main duct. No intraparenchymal cyst. No peripancreatic edema. Spleen: No splenomegaly. No focal mass lesion. Adrenals/Urinary Tract: No adrenal nodule or mass. Right kidney  and ureter are unremarkable. Status post left percutaneous nephrostomy tube placement with no evidence for residual left hydronephrosis. 6 mm right UPJ stone seen on the prior study is no longer evident although a 6 mm stone is identified in the renal pelvis adjacent to the formed loop of the percutaneous nephrostomy tube. Another 6 mm stone is identified in the lower pole the left kidney. Both of these stones are well seen on coronal image 58 of series 5. No left ureteral stone. Foley catheter decompresses the urinary bladder. Stomach/Bowel: Stomach is nondistended. No gastric wall thickening. No evidence of outlet obstruction. Duodenum is normally positioned as is the ligament of Treitz. No small bowel wall thickening. No small bowel dilatation. The terminal ileum is normal. The appendix is not visualized, but there is no edema or inflammation in the region of the cecum. Rectal tube noted. Vascular/Lymphatic: There is abdominal aortic atherosclerosis without aneurysm. There is no gastrohepatic or hepatoduodenal  ligament lymphadenopathy. No intraperitoneal or retroperitoneal lymphadenopathy. Recanalization of the paraumbilical vein is evident. No pelvic sidewall lymphadenopathy. Reproductive: Uterus surgically absent.  There is no adnexal mass. Other: Small volume intraperitoneal free fluid. Diffuse body wall edema noted. Musculoskeletal: Bone windows reveal no worrisome lytic or sclerotic osseous lesions. Intramuscular hematoma may be present in the right lateral abdominal wall musculature (see image 37 series 2). IMPRESSION: 1. Interval placement of left percutaneous nephrostomy tube with no evidence for left hydronephrosis on today's exam. The UPJ stone seen on the previous study is no longer evident although this stone may now be in the central renal pelvis. Another 6 mm stone is identified in the lower pole left kidney. 2. Cholelithiasis with tiny stone in the porta hepatis, potentially in the cystic duct or common duct. 3. Cirrhotic changes in the liver with recanalization of the paraumbilical vein suggesting portal venous hypertension. 4. Small volume intraperitoneal free fluid with diffuse body wall edema. Focal fluid/edema attenuation in the right lateral wall abdominal musculature raises the question of hematoma. 5. Interval increase in bibasilar collapse/consolidation with small bilateral pleural effusions. 6.  Aortic Atherosclerois (ICD10-170.0) Electronically Signed   By: Kennith Center M.D.   On: 08/30/2016 15:01     Medications:   . meropenem (MERREM) IV Stopped (09/01/16 0006)  . vancomycin     . citalopram  20 mg Oral Daily  . feeding supplement (ENSURE ENLIVE)  237 mL Oral TID BM  . insulin aspart  0-20 Units Subcutaneous Q4H  . insulin glargine  20 Units Subcutaneous Daily  . mouth rinse  15 mL Mouth Rinse q12n4p  . pantoprazole sodium  40 mg Per Tube Q1200  . sodium chloride flush  10-40 mL Intracatheter Q12H   acetaminophen **OR** [DISCONTINUED] acetaminophen, albuterol, bisacodyl,  fentaNYL (SUBLIMAZE) injection, hydrALAZINE, ondansetron **OR** ondansetron (ZOFRAN) IV, senna-docusate, sodium chloride flush  Assessment/ Plan:  Ms. DEBORAHA GOAR is a 66 y.o. black female with diabetes mellitus type II, hypertension, history of CVA, who was admitted to [facility] on 08/24/2016 for septic shock  1. Acute renal failure with metabolic acidosis: baseline creatinine of 0.7 on 06/12/2016.  Acute renal failure secondary obstructive uropathy, sepsis, hypotension and ATN.  Left  hydrohephrosis- Percutaneous nephrostomy placed by [facility] on 08/24/16  UOP > 1000 cc ow - No acute indication for dialysis today.   - electrolytes and volume status are acceptable  2. Septic Shock: Klebsiella.  Afebrile. Leukocytosis    Klebsiella in urine  antibiotics as per primary team  3. Cirrhosis and anasarca noted on  CT ? Etiology  Will follow    LOS: 8 Fareeha Evon 7/9/20188:56 AM

## 2016-09-01 NOTE — Consult Note (Signed)
[facility] Infectious Disease     Reason for Consult: Leukocytosis, gangrene, klebsiella bacteremia   Referring Physician: Corbin Ade  Date of Admission:  08/24/2016   Active Problems:   Septic shock ([place])   Sepsis ([place])   Acute renal failure ([place])   Kidney stone on left side   HPI: Megan Nixon is a 66 y.o. female admitted with AMS and L flank pain. Found to be in septic shock and have lactate 10 and CT showed L UPJ obstructing stone. She was started on IV abx and bcx/ucx grew Klebsiella. She was in unit, intubated, on pressors, on CRRT but has improved, is off vent, off pressors and off CRRT but wbc remains elevated. Has nephrostomy placed.  She has developed gangrenous lesions to toes and some mottling to hands. Has been seen by vascular.  Cr continues to increase off CRRT and not making much urine,  Had rectal tube in place but dced. C diff test was negative.  NArrowed to ancef today  Past Medical History:  Diagnosis Date  . Diabetes mellitus without complication ([place])   . Hypertension   . Stroke [facility])    Past Surgical History:  Procedure Laterality Date  . BREAST EXCISIONAL BIOPSY Bilateral 1971   neg  . IR NEPHROSTOMY PLACEMENT LEFT  08/24/2016   Social History  Substance Use Topics  . Smoking status: Never Smoker  . Smokeless tobacco: Never Used  . Alcohol use No   Family History  Problem Relation Age of Onset  . Breast cancer Neg Hx     Allergies: No Known Allergies  Current antibiotics: Antibiotics Given (last 72 hours)    Date/Time Action Medication Dose Rate   08/29/16 2225 New Bag/Given   meropenem (MERREM) 1 g in sodium chloride 0.9 % 100 mL IVPB 1 g 200 mL/hr   08/30/16 0952 New Bag/Given   meropenem (MERREM) 1 g in sodium chloride 0.9 % 100 mL IVPB 1 g 200 mL/hr   08/30/16 1044 New Bag/Given   vancomycin (VANCOCIN) IVPB 1000 mg/200 mL premix 1,000 mg 200 mL/hr   08/30/16 2231 New Bag/Given   meropenem (MERREM) 1 g in sodium chloride 0.9 % 100 mL  IVPB 1 g 200 mL/hr   08/31/16 1011 New Bag/Given   meropenem (MERREM) 1 g in sodium chloride 0.9 % 100 mL IVPB 1 g 200 mL/hr   08/31/16 1011 New Bag/Given   vancomycin (VANCOCIN) IVPB 1000 mg/200 mL premix 1,000 mg 200 mL/hr   08/31/16 2334 New Bag/Given   meropenem (MERREM) 1 g in sodium chloride 0.9 % 100 mL IVPB 1 g 200 mL/hr   09/01/16 0903 New Bag/Given   meropenem (MERREM) 1 g in sodium chloride 0.9 % 100 mL IVPB 1 g 200 mL/hr      MEDICATIONS: . citalopram  20 mg Oral Daily  . feeding supplement (ENSURE ENLIVE)  237 mL Oral TID BM  . insulin aspart  0-20 Units Subcutaneous Q4H  . insulin glargine  20 Units Subcutaneous Daily  . mouth rinse  15 mL Mouth Rinse q12n4p  . pantoprazole  40 mg Oral Daily  . sodium chloride flush  10-40 mL Intracatheter Q12H    Review of Systems - 11 systems reviewed and negative per HPI   OBJECTIVE: Temp:  [98.5 F (36.9 C)-99.1 F (37.3 C)] 98.8 F (37.1 C) (07/09 0200) Pulse Rate:  [96-113] 108 (07/09 1245) Resp:  [14-36] 23 (07/09 1245) BP: (113-167)/(61-109) 141/76 (07/09 1245) SpO2:  [91 %-100 %] 94 % (  07/09 1245) Physical Exam  Constitutional:  Awake, family at bedside, slowed mentation  HENT: [place]/AT, PERRLA, no scleral icterus Mouth/Throat: Oropharynx is clear and moist. No oropharyngeal exudate.  Cardiovascular: Normal rate, regular rhythm and normal heart sounds. Pulmonary/Chest: Effort normal and breath sounds normal. No respiratory distress.  has no wheezes.  Neck = supple, no nuchal rigidity Abdominal: Soft. Bowel sounds are normal.  exhibits no distension. There is no tenderness.  Lymphadenopathy: no cervical adenopathy. No axillary adenopathy Neurological: alert and oriented to person, place, and time.  Skin:bil hands with areas of pink and purplish discoloration,  bil feet with areas of bluish discoloration, gangrenous changes of several toes. Blister on dorsum of several toes Psychiatric: a normal mood and affect.   behavior is normal.   LABS: Results for orders placed or performed during the hospital encounter of 08/24/16 (from the past 48 hour(s))  Glucose, capillary     Status: Abnormal   Collection Time: 08/30/16  3:36 PM  Result Value Ref Range   Glucose-Capillary 130 (H) 65 - 99 mg/dL   Comment 1 Notify RN   Glucose, capillary     Status: Abnormal   Collection Time: 08/30/16  7:54 PM  Result Value Ref Range   Glucose-Capillary 124 (H) 65 - 99 mg/dL  Glucose, capillary     Status: Abnormal   Collection Time: 08/31/16 12:07 AM  Result Value Ref Range   Glucose-Capillary 117 (H) 65 - 99 mg/dL  Glucose, capillary     Status: None   Collection Time: 08/31/16  4:00 AM  Result Value Ref Range   Glucose-Capillary 74 65 - 99 mg/dL  Procalcitonin     Status: None   Collection Time: 08/31/16  4:21 AM  Result Value Ref Range   Procalcitonin 31.79 ng/mL    Comment:        Interpretation: PCT >= 10 ng/mL: Important systemic inflammatory response, almost exclusively due to severe bacterial sepsis or septic shock. (NOTE)         ICU PCT Algorithm               Non ICU PCT Algorithm    ----------------------------     ------------------------------         PCT < 0.25 ng/mL                 PCT < 0.1 ng/mL     Stopping of antibiotics            Stopping of antibiotics       strongly encouraged.               strongly encouraged.    ----------------------------     ------------------------------       PCT level decrease by               PCT < 0.25 ng/mL       >= 80% from peak PCT       OR PCT 0.25 - 0.5 ng/mL          Stopping of antibiotics                                             encouraged.     Stopping of antibiotics           encouraged.    ----------------------------     ------------------------------  PCT level decrease by              PCT >= 0.25 ng/mL       < 80% from peak PCT        AND PCT >= 0.5 ng/mL             Continuing antibiotics                                               encouraged.       Continuing antibiotics            encouraged.    ----------------------------     ------------------------------     PCT level increase compared          PCT > 0.5 ng/mL         with peak PCT AND          PCT >= 0.5 ng/mL             Escalation of antibiotics                                          strongly encouraged.      Escalation of antibiotics        strongly encouraged.   CBC     Status: Abnormal   Collection Time: 08/31/16  4:21 AM  Result Value Ref Range   WBC 46.9 (H) 3.6 - 11.0 K/uL   RBC 2.82 (L) 3.80 - 5.20 MIL/uL   Hemoglobin 8.2 (L) 12.0 - 16.0 g/dL   HCT 24.5 (L) 35.0 - 47.0 %   MCV 87.1 80.0 - 100.0 fL   MCH 28.9 26.0 - 34.0 pg   MCHC 33.2 32.0 - 36.0 g/dL   RDW 17.0 (H) 11.5 - 14.5 %   Platelets 79 (L) 150 - 440 K/uL  Comprehensive metabolic panel     Status: Abnormal   Collection Time: 08/31/16  4:21 AM  Result Value Ref Range   Sodium 139 135 - 145 mmol/L   Potassium 4.3 3.5 - 5.1 mmol/L   Chloride 107 101 - 111 mmol/L   CO2 27 22 - 32 mmol/L   Glucose, Bld 142 (H) 65 - 99 mg/dL   BUN 56 (H) 6 - 20 mg/dL   Creatinine, Ser 2.46 (H) 0.44 - 1.00 mg/dL   Calcium 8.2 (L) 8.9 - 10.3 mg/dL   Total Protein 5.3 (L) 6.5 - 8.1 g/dL   Albumin 2.2 (L) 3.5 - 5.0 g/dL   AST 168 (H) 15 - 41 U/L   ALT 73 (H) 14 - 54 U/L   Alkaline Phosphatase 102 38 - 126 U/L   Total Bilirubin 1.0 0.3 - 1.2 mg/dL   GFR calc non Af Amer 19 (L) >60 mL/min   GFR calc Af Amer 22 (L) >60 mL/min    Comment: (NOTE) The eGFR has been calculated using the CKD EPI equation. This calculation has not been validated in all clinical situations. eGFR's persistently <60 mL/min signify possible Chronic Kidney Disease.    Anion gap 5 5 - 15  Magnesium     Status: None   Collection Time: 08/31/16  4:21 AM  Result Value Ref Range   Magnesium 1.9 1.7 - 2.4 mg/dL  Phosphorus  Status: None   Collection Time: 08/31/16  4:21 AM  Result Value Ref Range   Phosphorus 4.1  2.5 - 4.6 mg/dL  Glucose, capillary     Status: Abnormal   Collection Time: 08/31/16  7:07 AM  Result Value Ref Range   Glucose-Capillary 136 (H) 65 - 99 mg/dL  Protime-INR     Status: Abnormal   Collection Time: 08/31/16  7:53 AM  Result Value Ref Range   Prothrombin Time 16.5 (H) 11.4 - 15.2 seconds   INR 1.32   APTT     Status: None   Collection Time: 08/31/16  7:53 AM  Result Value Ref Range   aPTT 31 24 - 36 seconds  Glucose, capillary     Status: Abnormal   Collection Time: 08/31/16 11:00 AM  Result Value Ref Range   Glucose-Capillary 203 (H) 65 - 99 mg/dL  Glucose, capillary     Status: Abnormal   Collection Time: 08/31/16  3:55 PM  Result Value Ref Range   Glucose-Capillary 108 (H) 65 - 99 mg/dL  Glucose, capillary     Status: Abnormal   Collection Time: 08/31/16  8:05 PM  Result Value Ref Range   Glucose-Capillary 107 (H) 65 - 99 mg/dL  CBC     Status: Abnormal   Collection Time: 08/31/16  9:44 PM  Result Value Ref Range   WBC 47.1 (H) 3.6 - 11.0 K/uL   RBC 3.15 (L) 3.80 - 5.20 MIL/uL   Hemoglobin 8.7 (L) 12.0 - 16.0 g/dL   HCT 26.8 (L) 35.0 - 47.0 %   MCV 85.1 80.0 - 100.0 fL   MCH 27.7 26.0 - 34.0 pg   MCHC 32.6 32.0 - 36.0 g/dL   RDW 16.6 (H) 11.5 - 14.5 %   Platelets 93 (L) 150 - 440 K/uL  Basic metabolic panel     Status: Abnormal   Collection Time: 08/31/16  9:44 PM  Result Value Ref Range   Sodium 140 135 - 145 mmol/L   Potassium 3.5 3.5 - 5.1 mmol/L   Chloride 106 101 - 111 mmol/L   CO2 24 22 - 32 mmol/L   Glucose, Bld 126 (H) 65 - 99 mg/dL   BUN 73 (H) 6 - 20 mg/dL   Creatinine, Ser 3.40 (H) 0.44 - 1.00 mg/dL   Calcium 8.6 (L) 8.9 - 10.3 mg/dL   GFR calc non Af Amer 13 (L) >60 mL/min   GFR calc Af Amer 15 (L) >60 mL/min    Comment: (NOTE) The eGFR has been calculated using the CKD EPI equation. This calculation has not been validated in all clinical situations. eGFR's persistently <60 mL/min signify possible Chronic Kidney Disease.    Anion  gap 10 5 - 15  Lactic acid, plasma     Status: None   Collection Time: 08/31/16  9:44 PM  Result Value Ref Range   Lactic Acid, Venous 1.2 0.5 - 1.9 mmol/L  Protime-INR     Status: Abnormal   Collection Time: 08/31/16  9:44 PM  Result Value Ref Range   Prothrombin Time 16.3 (H) 11.4 - 15.2 seconds   INR 1.30   APTT     Status: None   Collection Time: 08/31/16  9:44 PM  Result Value Ref Range   aPTT 30 24 - 36 seconds  Glucose, capillary     Status: Abnormal   Collection Time: 08/31/16 11:52 PM  Result Value Ref Range   Glucose-Capillary 146 (H) 65 - 99 mg/dL  Glucose, capillary  Status: Abnormal   Collection Time: 09/01/16  3:46 AM  Result Value Ref Range   Glucose-Capillary 129 (H) 65 - 99 mg/dL  Glucose, capillary     Status: Abnormal   Collection Time: 09/01/16  4:56 AM  Result Value Ref Range   Glucose-Capillary 125 (H) 65 - 99 mg/dL  Renal function     Status: Abnormal   Collection Time: 09/01/16  6:06 AM  Result Value Ref Range   Sodium 138 135 - 145 mmol/L   Potassium 3.8 3.5 - 5.1 mmol/L   Chloride 105 101 - 111 mmol/L   CO2 24 22 - 32 mmol/L   Glucose, Bld 126 (H) 65 - 99 mg/dL   BUN 76 (H) 6 - 20 mg/dL   Creatinine, Ser 3.56 (H) 0.44 - 1.00 mg/dL   Calcium 8.4 (L) 8.9 - 10.3 mg/dL   Phosphorus 5.4 (H) 2.5 - 4.6 mg/dL   Albumin 2.4 (L) 3.5 - 5.0 g/dL   GFR calc non Af Amer 12 (L) >60 mL/min   GFR calc Af Amer 14 (L) >60 mL/min    Comment: (NOTE) The eGFR has been calculated using the CKD EPI equation. This calculation has not been validated in all clinical situations. eGFR's persistently <60 mL/min signify possible Chronic Kidney Disease.    Anion gap 9 5 - 15  CBC     Status: Abnormal   Collection Time: 09/01/16  6:06 AM  Result Value Ref Range   WBC 41.1 (H) 3.6 - 11.0 K/uL   RBC 3.01 (L) 3.80 - 5.20 MIL/uL   Hemoglobin 8.8 (L) 12.0 - 16.0 g/dL   HCT 26.1 (L) 35.0 - 47.0 %   MCV 86.8 80.0 - 100.0 fL   MCH 29.2 26.0 - 34.0 pg   MCHC 33.6 32.0 -  36.0 g/dL   RDW 16.9 (H) 11.5 - 14.5 %   Platelets 93 (L) 150 - 440 K/uL  Magnesium     Status: None   Collection Time: 09/01/16  6:06 AM  Result Value Ref Range   Magnesium 2.1 1.7 - 2.4 mg/dL  Phosphorus     Status: Abnormal   Collection Time: 09/01/16  6:06 AM  Result Value Ref Range   Phosphorus 5.5 (H) 2.5 - 4.6 mg/dL  Protime-INR     Status: Abnormal   Collection Time: 09/01/16  6:06 AM  Result Value Ref Range   Prothrombin Time 16.8 (H) 11.4 - 15.2 seconds   INR 1.35   APTT     Status: None   Collection Time: 09/01/16  6:06 AM  Result Value Ref Range   aPTT 31 24 - 36 seconds  Glucose, capillary     Status: Abnormal   Collection Time: 09/01/16  7:13 AM  Result Value Ref Range   Glucose-Capillary 120 (H) 65 - 99 mg/dL  Glucose, capillary     Status: Abnormal   Collection Time: 09/01/16 12:38 PM  Result Value Ref Range   Glucose-Capillary 146 (H) 65 - 99 mg/dL   No components found for: ESR, C REACTIVE PROTEIN MICRO: Recent Results (from the past 720 hour(s))  Blood Culture (routine x 2)     Status: Abnormal   Collection Time: 08/24/16  8:25 AM  Result Value Ref Range Status   Specimen Description BLOOD RIGHT ANTECUBITAL  Final   Special Requests   Final    BOTTLES DRAWN AEROBIC AND ANAEROBIC Blood Culture adequate volume   Culture  Setup Time   Final    GRAM NEGATIVE  RODS IN BOTH AEROBIC AND ANAEROBIC BOTTLES CRITICAL RESULT CALLED TO, READ BACK BY AND VERIFIED WITH: JASON ROBBINS ON 08/24/16 AT 1933 QSD    Culture (A)  Final    KLEBSIELLA PNEUMONIAE SUSCEPTIBILITIES PERFORMED ON PREVIOUS CULTURE WITHIN THE LAST 5 DAYS. Performed at [facility], [place] [address], [place], [place] [address]    Report Status 08/27/2016 FINAL  Final  Blood Culture (routine x 2)     Status: Abnormal   Collection Time: 08/24/16  8:25 AM  Result Value Ref Range Status   Specimen Description BLOOD RIGHT ANTECUBITAL  Final   Special Requests   Final    BOTTLES DRAWN AEROBIC AND  ANAEROBIC Blood Culture adequate volume   Culture  Setup Time   Final    GRAM NEGATIVE RODS IN BOTH AEROBIC AND ANAEROBIC BOTTLES CRITICAL RESULT CALLED TO, READ BACK BY AND VERIFIED WITH: JASON ROBBINS ON 08/24/16 AT 1933 QSD    Culture KLEBSIELLA PNEUMONIAE (A)  Final   Report Status 08/27/2016 FINAL  Final   Organism ID, Bacteria KLEBSIELLA PNEUMONIAE  Final      Susceptibility   Klebsiella pneumoniae - MIC*    AMPICILLIN >=32 RESISTANT Resistant     CEFAZOLIN <=4 SENSITIVE Sensitive     CEFEPIME <=1 SENSITIVE Sensitive     CEFTAZIDIME <=1 SENSITIVE Sensitive     CEFTRIAXONE <=1 SENSITIVE Sensitive     CIPROFLOXACIN <=0.25 SENSITIVE Sensitive     GENTAMICIN <=1 SENSITIVE Sensitive     IMIPENEM <=0.25 SENSITIVE Sensitive     TRIMETH/SULFA <=20 SENSITIVE Sensitive     AMPICILLIN/SULBACTAM 8 SENSITIVE Sensitive     PIP/TAZO <=4 SENSITIVE Sensitive     Extended ESBL NEGATIVE Sensitive     * KLEBSIELLA PNEUMONIAE  Blood Culture ID Panel (Reflexed)     Status: Abnormal   Collection Time: 08/24/16  8:25 AM  Result Value Ref Range Status   Enterococcus species NOT DETECTED NOT DETECTED Final   Listeria monocytogenes NOT DETECTED NOT DETECTED Final   Staphylococcus species NOT DETECTED NOT DETECTED Final   Staphylococcus aureus NOT DETECTED NOT DETECTED Final   Streptococcus species NOT DETECTED NOT DETECTED Final   Streptococcus agalactiae NOT DETECTED NOT DETECTED Final   Streptococcus pneumoniae NOT DETECTED NOT DETECTED Final   Streptococcus pyogenes NOT DETECTED NOT DETECTED Final   Acinetobacter baumannii NOT DETECTED NOT DETECTED Final   Enterobacteriaceae species DETECTED (A) NOT DETECTED Final    Comment: Enterobacteriaceae represent a large family of gram-negative bacteria, not a single organism. CRITICAL RESULT CALLED TO, READ BACK BY AND VERIFIED WITH: JASON ROBBINS ON 08/24/16 AT 1933 QSD    Enterobacter cloacae complex NOT DETECTED NOT DETECTED Final   Escherichia coli  NOT DETECTED NOT DETECTED Final   Klebsiella oxytoca NOT DETECTED NOT DETECTED Final   Klebsiella pneumoniae DETECTED (A) NOT DETECTED Final    Comment: CRITICAL RESULT CALLED TO, READ BACK BY AND VERIFIED WITH: JASON ROBBINS ON 08/24/16 AT 1933 QSD    Proteus species NOT DETECTED NOT DETECTED Final   Serratia marcescens NOT DETECTED NOT DETECTED Final   Carbapenem resistance NOT DETECTED NOT DETECTED Final   Haemophilus influenzae NOT DETECTED NOT DETECTED Final   Neisseria meningitidis NOT DETECTED NOT DETECTED Final   Pseudomonas aeruginosa NOT DETECTED NOT DETECTED Final   Candida albicans NOT DETECTED NOT DETECTED Final   Candida glabrata NOT DETECTED NOT DETECTED Final   Candida krusei NOT DETECTED NOT DETECTED Final   Candida parapsilosis NOT DETECTED NOT DETECTED  Final   Candida tropicalis NOT DETECTED NOT DETECTED Final  Urine culture     Status: Abnormal   Collection Time: 08/24/16  9:19 AM  Result Value Ref Range Status   Specimen Description URINE, CATHETERIZED  Final   Special Requests NONE  Final   Culture >=100,000 COLONIES/mL KLEBSIELLA PNEUMONIAE (A)  Final   Report Status 08/26/2016 FINAL  Final   Organism ID, Bacteria KLEBSIELLA PNEUMONIAE (A)  Final      Susceptibility   Klebsiella pneumoniae - MIC*    AMPICILLIN >=32 RESISTANT Resistant     CEFAZOLIN <=4 SENSITIVE Sensitive     CEFTRIAXONE <=1 SENSITIVE Sensitive     CIPROFLOXACIN <=0.25 SENSITIVE Sensitive     GENTAMICIN <=1 SENSITIVE Sensitive     IMIPENEM <=0.25 SENSITIVE Sensitive     NITROFURANTOIN 64 INTERMEDIATE Intermediate     TRIMETH/SULFA <=20 SENSITIVE Sensitive     AMPICILLIN/SULBACTAM 4 SENSITIVE Sensitive     PIP/TAZO <=4 SENSITIVE Sensitive     Extended ESBL NEGATIVE Sensitive     * >=100,000 COLONIES/mL KLEBSIELLA PNEUMONIAE  Body fluid culture     Status: None   Collection Time: 08/24/16  3:30 PM  Result Value Ref Range Status   Specimen Description FLUID  Final   Special Requests LEFT  NEPHROSTOMY  Final   Gram Stain   Final    WBC PRESENT, PREDOMINANTLY PMN GRAM NEGATIVE RODS CYTOSPIN SMEAR Performed at [facility], [address] [place], [place] [address]    Culture FEW KLEBSIELLA PNEUMONIAE  Final   Report Status 08/28/2016 FINAL  Final   Organism ID, Bacteria KLEBSIELLA PNEUMONIAE  Final      Susceptibility   Klebsiella pneumoniae - MIC*    AMPICILLIN >=32 RESISTANT Resistant     CEFAZOLIN <=4 SENSITIVE Sensitive     CEFEPIME <=1 SENSITIVE Sensitive     CEFTAZIDIME <=1 SENSITIVE Sensitive     CEFTRIAXONE <=1 SENSITIVE Sensitive     CIPROFLOXACIN <=0.25 SENSITIVE Sensitive     GENTAMICIN <=1 SENSITIVE Sensitive     IMIPENEM <=0.25 SENSITIVE Sensitive     TRIMETH/SULFA <=20 SENSITIVE Sensitive     AMPICILLIN/SULBACTAM 8 SENSITIVE Sensitive     PIP/TAZO <=4 SENSITIVE Sensitive     Extended ESBL NEGATIVE Sensitive     * FEW KLEBSIELLA PNEUMONIAE  MRSA PCR Screening     Status: None   Collection Time: 08/24/16  3:44 PM  Result Value Ref Range Status   MRSA by PCR NEGATIVE NEGATIVE Final    Comment:        The GeneXpert MRSA Assay (FDA approved for NASAL specimens only), is one component of a comprehensive MRSA colonization surveillance program. It is not intended to diagnose MRSA infection nor to guide or monitor treatment for MRSA infections.   Culture, respiratory (NON-Expectorated)     Status: None   Collection Time: 08/25/16  7:00 AM  Result Value Ref Range Status   Specimen Description TRACHEAL ASPIRATE  Final   Special Requests NONE  Final   Gram Stain   Final    FEW WBC PRESENT, PREDOMINANTLY PMN FEW SQUAMOUS EPITHELIAL CELLS PRESENT NO ORGANISMS SEEN    Culture   Final    Consistent with normal respiratory flora. Performed at [facility], [place] [address], [place], [place] [address]    Report Status 08/27/2016 FINAL  Final  C difficile quick scan w PCR reflex     Status: None   Collection Time: 08/26/16  8:05 PM  Result  Value Ref Range Status   C Diff antigen NEGATIVE NEGATIVE Final   C Diff toxin NEGATIVE NEGATIVE Final   C Diff interpretation No C. difficile detected.  Final  Culture, blood (Routine X 2) w Reflex to ID Panel     Status: None (Preliminary result)   Collection Time: 08/30/16  9:03 AM  Result Value Ref Range Status   Specimen Description BLOOD LEFT ARM  Final   Special Requests   Final    BOTTLES DRAWN AEROBIC AND ANAEROBIC Blood Culture adequate volume   Culture NO GROWTH 2 DAYS  Final   Report Status PENDING  Incomplete  Culture, blood (Routine X 2) w Reflex to ID Panel     Status: None (Preliminary result)   Collection Time: 08/30/16 10:18 AM  Result Value Ref Range Status   Specimen Description BLOOD LEFT ARM  Final   Special Requests   Final    BOTTLES DRAWN AEROBIC AND ANAEROBIC Blood Culture adequate volume   Culture NO GROWTH 2 DAYS  Final   Report Status PENDING  Incomplete  Urine Culture     Status: None   Collection Time: 08/30/16 12:01 PM  Result Value Ref Range Status   Specimen Description URINE, RANDOM  Final   Special Requests NONE  Final   Culture   Final    NO GROWTH Performed at [facility], [place] [address] [place], [place] [address]    Report Status 08/31/2016 FINAL  Final    IMAGING: Ct Abdomen Pelvis Wo Contrast  Result Date: 08/30/2016 CLINICAL DATA:  Left UPJ obstruction with Klebsiella sepsis. EXAM: CT ABDOMEN AND PELVIS WITHOUT CONTRAST TECHNIQUE: Multidetector CT imaging of the abdomen and pelvis was performed following the standard protocol without IV contrast. COMPARISON:  08/24/2016 FINDINGS: Lower chest: Interval progression of bilateral lower lobe collapse/ consolidation with slight increase in small bilateral pleural effusions. Hepatobiliary: Nodular contour of the liver suggest cirrhosis. Layering tiny calcified gallstones evident. There may be a stone in the cystic duct or common duct (see image 25 series 2). No intrahepatic or extrahepatic  biliary dilation. Pancreas: No focal mass lesion. No dilatation of the main duct. No intraparenchymal cyst. No peripancreatic edema. Spleen: No splenomegaly. No focal mass lesion. Adrenals/Urinary Tract: No adrenal nodule or mass. Right kidney and ureter are unremarkable. Status post left percutaneous nephrostomy tube placement with no evidence for residual left hydronephrosis. 6 mm right UPJ stone seen on the prior study is no longer evident although a 6 mm stone is identified in the renal pelvis adjacent to the formed loop of the percutaneous nephrostomy tube. Another 6 mm stone is identified in the lower pole the left kidney. Both of these stones are well seen on coronal image 58 of series 5. No left ureteral stone. Foley catheter decompresses the urinary bladder. Stomach/Bowel: Stomach is nondistended. No gastric wall thickening. No evidence of outlet obstruction. Duodenum is normally positioned as is the ligament of Treitz. No small bowel wall thickening. No small bowel dilatation. The terminal ileum is normal. The appendix is not visualized, but there is no edema or inflammation in the region of the cecum. Rectal tube noted. Vascular/Lymphatic: There is abdominal aortic atherosclerosis without aneurysm. There is no gastrohepatic or hepatoduodenal ligament lymphadenopathy. No intraperitoneal or retroperitoneal lymphadenopathy. Recanalization of the paraumbilical vein is evident. No pelvic sidewall lymphadenopathy. Reproductive: Uterus surgically absent.  There is no adnexal mass. Other: Small volume intraperitoneal free fluid. Diffuse body wall edema noted. Musculoskeletal: Bone windows reveal  no worrisome lytic or sclerotic osseous lesions. Intramuscular hematoma may be present in the right lateral abdominal wall musculature (see image 37 series 2). IMPRESSION: 1. Interval placement of left percutaneous nephrostomy tube with no evidence for left hydronephrosis on today's exam. The UPJ stone seen on the  previous study is no longer evident although this stone may now be in the central renal pelvis. Another 6 mm stone is identified in the lower pole left kidney. 2. Cholelithiasis with tiny stone in the porta hepatis, potentially in the cystic duct or common duct. 3. Cirrhotic changes in the liver with recanalization of the paraumbilical vein suggesting portal venous hypertension. 4. Small volume intraperitoneal free fluid with diffuse body wall edema. Focal fluid/edema attenuation in the right lateral wall abdominal musculature raises the question of hematoma. 5. Interval increase in bibasilar collapse/consolidation with small bilateral pleural effusions. 6.  Aortic Atherosclerois (ICD10-170.0) Electronically Signed   By: Misty Stanley M.D.   On: 08/30/2016 15:01   Dg Abd 1 View  Result Date: 08/28/2016 CLINICAL DATA:  Decreased bowel sounds, left nephrostomy to a EXAM: ABDOMEN - 1 VIEW COMPARISON:  Abdomen films of 08/25/2016 FINDINGS: Left nephrostomy tube is again noted. NG tube is present with the tip in the region of the antrum - duodenal bulb. The bowel gas pattern is nonspecific. Cardiomegaly is again noted. IMPRESSION: 1. NG tube tip overlies the distal antrum -duodenal bulb region. 2. No bowel obstruction. 3. Left nephrostomy tube. Electronically Signed   By: Ivar Drape M.D.   On: 08/28/2016 17:05   Dg Abd 1 View  Result Date: 08/25/2016 CLINICAL DATA:  Orogastric tube placement.  Initial encounter. EXAM: ABDOMEN - 1 VIEW COMPARISON:  CT of the abdomen and pelvis performed 08/24/2016 FINDINGS: The patient's enteric tube is noted coiled overlying the body of the stomach, ending at the antrum of the stomach. The visualized bowel gas pattern is grossly unremarkable. No free intra-abdominal air is seen, though evaluation for free air is limited on supine views. A pigtail catheter is noted overlying the left lower quadrant. No acute osseous abnormalities are seen. Small bilateral pleural effusions are  noted. IMPRESSION: Enteric tube noted ending overlying the antrum of the stomach. Electronically Signed   By: Garald Balding M.D.   On: 08/25/2016 05:43   Ct Head Wo Contrast  Result Date: 08/24/2016 CLINICAL DATA:  Altered mental status, confusion beginning yesterday, stroke, hypertension, diabetes mellitus EXAM: CT HEAD WITHOUT CONTRAST TECHNIQUE: Contiguous axial images were obtained from the base of the skull through the vertex without intravenous contrast. Sagittal and coronal MPR images reconstructed from axial data set. COMPARISON:  05/07/2014 FINDINGS: Brain: Mild generalized atrophy. Normal ventricular morphology. No midline shift or mass effect. Small old lacunar infarct LEFT thalamus. Minimal small vessel chronic ischemic changes of deep cerebral white matter. No intracranial hemorrhage, mass lesion or evidence of acute infarction. No extra-axial fluid collections. Vascular: Atherosclerotic calcification of internal carotid and vertebral arteries at skullbase Skull: Intact Sinuses/Orbits: Clear Other: N/A IMPRESSION: Atrophy with minimal small vessel chronic ischemic changes of deep cerebral white matter. 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 No acute intracranial abnormalities. Electronically Signed   By: Lavonia Dana M.D.   On: 08/24/2016 09:17   Ct Angio Chest Pe W And/or Wo Contrast  Result Date: 08/24/2016 CLINICAL DATA:  Hypoxia, tachycardia EXAM: CT ANGIOGRAPHY CHEST WITH CONTRAST TECHNIQUE: Multidetector CT imaging of the chest was performed using the standard protocol during bolus administration of intravenous contrast. Multiplanar CT image reconstructions and MIPs  were obtained to evaluate the vascular anatomy. CONTRAST:  60 cc Isovue 370 IV COMPARISON:  08/24/2016 FINDINGS: Cardiovascular: Cardiomegaly. No evidence of aortic aneurysm. No filling defects in the pulmonary arteries to suggest pulmonary emboli. Diffuse coronary artery calcifications. Mediastinum/Nodes: No mediastinal, hilar, or  axillary adenopathy. Lungs/Pleura: Vascular congestion. Bibasilar atelectasis. No pleural effusions. Upper Abdomen: Imaging into the upper abdomen shows no acute findings. Musculoskeletal: Chest wall soft tissues are unremarkable. No acute bony abnormality. Review of the MIP images confirms the above findings. IMPRESSION: No evidence of pulmonary embolus. Cardiomegaly, coronary artery disease. Pulmonary vascular congestion. Bibasilar atelectasis. No evidence of pulmonary embolus. Electronically Signed   By: Rolm Baptise M.D.   On: 08/24/2016 11:41   US Renal  Result Date: 08/28/2016 CLINICAL DATA:  Intubated patient. 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 Clinical concern of perinephric abscess. EXAM: RENAL / URINARY TRACT ULTRASOUND COMPLETE COMPARISON:  Noncontrast abdominopelvic CT scan of August 24, 2016. FINDINGS: Right Kidney: Length: 13 cm. Echogenicity is approximately equal to that of the liver. No mass or hydronephrosis visualized. No abnormal perinephric fluid collections are observed. Left Kidney: Length: 12.5 cm. There is an upper pole cyst measuring 1.6 cm in greatest dimension. There is no hydronephrosis. The renal cortical echotexture is similar to that on the right. No abnormal perinephric fluid collections are observed. Bladder: Decompressed with a Foley catheter. IMPRESSION: Mildly increased renal cortical echotexture suggests medical renal disease. No perinephric abscess is observed. There is no hydronephrosis. No perinephric abscess is observed. If the patient's symptoms warrant further imaging, CT scanning would be the most useful next imaging step. Electronically Signed   By: Kerith Sherley  [place] M.D.   On: 08/28/2016 11:40   Dg Chest Port 1 View  Result Date: 08/30/2016 CLINICAL DATA:  Initial evaluation for acute respiratory failure. EXAM: PORTABLE CHEST 1 VIEW COMPARISON:  Prior radiograph from 08/28/2016. FINDINGS: Interval removal of endotracheal and  enteric tubes. Right IJ approach central venous catheter remains in place with tip overlying the cavoatrial junction. Stable cardiomegaly. Mediastinal silhouette unchanged. Lungs are hypoinflated. Persistent pulmonary vascular congestion without frank pulmonary edema. Persistent left pleural effusion, with decreased right pleural effusion. Probable superimposed bibasilar atelectasis. No definite focal infiltrates. No pneumothorax. No acute osseus abnormality. IMPRESSION: 1. Interval removal of endotracheal and enteric tubes. Stable right IJ central venous catheter. 2. Stable cardiomegaly with mild CHF, slightly improved from previous. Persistent small bilateral pleural effusions. 3. Bibasilar atelectasis. Electronically Signed   By: Jeannine Boga M.D.   On: 08/30/2016 06:11   Dg Chest Port 1 View  Result Date: 08/28/2016 CLINICAL DATA:  Respiratory failure, septic shock, acute renal failure, renal stone disease. Diabetes EXAM: PORTABLE CHEST 1 VIEW COMPARISON:  Portable chest x-ray of August 27, 2016 FINDINGS: The lungs are reasonably well inflated. The interstitial markings remain increased with confluent densities at the bases obscuring the hemidiaphragms. The cardiac silhouette is enlarged. The pulmonary vascularity is indistinct. The endotracheal tube tip lies approximately 3.1 cm above the carina. The esophagogastric tube tip projects below the inferior margin of the image. The right internal jugular venous catheter tip projects over the midportion of the SVC. IMPRESSION: Bibasilar atelectasis or pneumonia with small bilateral pleural effusions. Mild CHF. The support tubes are in reasonable position. Electronically Signed   By: Emmalina Espericueta  [place] M.D.   On: 08/28/2016 07:05   Dg Chest Port 1 View  Result Date: 08/27/2016 CLINICAL DATA:  Respiratory failure EXAM: PORTABLE CHEST 1 VIEW COMPARISON:  08/26/2016 FINDINGS:  Support devices are stable. Cardiomegaly with vascular congestion. Elevation of the right  hemidiaphragm. Small bilateral effusions and bibasilar atelectasis or infiltrates. IMPRESSION: Small bilateral effusions. Cardiomegaly with vascular congestion, bibasilar atelectasis or infiltrates. No real change. Electronically Signed   By: Rolm Baptise M.D.   On: 08/27/2016 07:19   Dg Chest Port 1 View  Result Date: 08/26/2016 CLINICAL DATA:  Respiratory failure. EXAM: PORTABLE CHEST 1 VIEW COMPARISON:  08/25/2016 . FINDINGS: Endotracheal tube has been slightly withdrawn, its tip is 1.9 cm above the carina. NG tube, right IJ line stable position. Cardiomegaly with bilateral pulmonary interstitial prominence suggesting interstitial edema. Small bilateral pleural effusions. No significant change from prior exam. No pneumothorax . IMPRESSION: 1. Endotracheal tube is been slightly withdrawn, its tip is 1.9 cm above the carina. NG tube and right IJ line stable position. 2. Cardiomegaly with bilateral interstitial prominence suggesting interstitial edema. Small bilateral pleural effusions. No significant change from prior exam. Electronically Signed   By: George   On: 08/26/2016 06:54   Dg Chest Port 1 View  Result Date: 08/25/2016 CLINICAL DATA:  Sepsis. EXAM: PORTABLE CHEST 1 VIEW COMPARISON:  08/24/2016. FINDINGS: Endotracheal tube tip is at the level of the carina. Proximal repositioning of approximately 3-4 cm suggested. Cardiomegaly with bilateral pulmonary infiltrates suggesting pulmonary edema. Bilateral pleural effusions. Bibasilar atelectasis. No pneumothorax. IMPRESSION: 1. Endotracheal tube tip is at the level of the carina. Proximal repositioning of approximately 3 4 cm suggested. 2. Cardiomegaly with bilateral pulmonary infiltrates suggesting pulmonary edema. Bilateral pleural effusions. 3. Bibasilar atelectasis. Critical Value/emergent results were called by telephone at the time of interpretation on 08/25/2016 at 6:33 am to nurse [place] who verbally acknowledged these results.  Electronically Signed   By: Marcello Moores  Register   On: 08/25/2016 06:35   Dg Chest Portable 1 View  Result Date: 08/24/2016 CLINICAL DATA:  Central line placement and intubation. EXAM: PORTABLE CHEST 1 VIEW COMPARISON:  CT of the chest 08/24/2016 FINDINGS: Patient is intubated. Endotracheal tube terminates 2 cm above the carina. The right internal jugular approach central venous catheter terminates in the expected location of the right atrium. Cardiomediastinal silhouette is enlarged, exaggerated by AP portable technique. There is no evidence of focal airspace consolidation, or pneumothorax. There is a possible left pleural effusion. Low lung volumes. Osseous structures are without acute abnormality. Soft tissues are grossly normal. IMPRESSION: Endotracheal tube terminates 2 cm above the carina. Right internal jugular approach central venous catheter tip overlies the expected location of the right atrium. The cardiac silhouette appears enlarged, which may be exaggerated by AP portable technique. Possible left pleural effusion versus prominent pericardial fat. Given recent instrumentation, decubital radiograph, left side down, should be considered to further evaluate whether or not there is left pleural fluid. Electronically Signed   By: Fidela Salisbury M.D.   On: 08/24/2016 14:30   Dg Chest Port 1 View  Result Date: 08/24/2016 CLINICAL DATA:  Altered mental status, confusion, prior stroke, hypertension, diabetes mellitus EXAM: PORTABLE CHEST 1 VIEW COMPARISON:  Portable exam 0734 hours without priors for comparison FINDINGS: Enlargement of cardiac silhouette with pulmonary vascular congestion. Mild RIGHT basilar atelectasis. Upper lungs clear. No pleural effusion or pneumothorax. Bones demineralized. IMPRESSION: Enlargement of cardiac silhouette with minimal pulmonary vascular congestion. RIGHT basilar atelectasis. Electronically Signed   By: Lavonia Dana M.D.   On: 08/24/2016 07:58   Ct Renal Stone  Study  Result Date: 08/24/2016 CLINICAL DATA:  Abdominal pain and UTI. EXAM: CT ABDOMEN AND PELVIS WITHOUT  CONTRAST TECHNIQUE: Multidetector CT imaging of the abdomen and pelvis was performed following the standard protocol without IV contrast. COMPARISON:  Chest CT, same date. FINDINGS: Lower chest: Persistent bibasilar atelectasis. Small right pleural effusion. The heart is mildly enlarged. Three-vessel coronary artery calcifications are noted. The distal esophagus is grossly normal. Hepatobiliary: Suspect cirrhotic changes involving the liver with portal venous hypertension and portal venous collaterals. No focal hepatic lesion. Gallstones are noted the gallbladder. Pancreas: Grossly normal without contrast. Spleen: Normal size. Adrenals/Urinary Tract: The adrenal glands are grossly normal. High-grade obstruction of the left kidney due to a 5.5 x 5.0 mm UPJ calculus. Perinephric interstitial changes and fluid are noted. I do not see any contrast below the calculus. The right kidney is unremarkable. No ureteral dilatation. The bladder is filled with contrast and appears normal. There is a left-sided bladder diverticulum noted. Stomach/Bowel: The stomach, duodenum, small bowel and colon are grossly normal. No obvious mass lesions or obstructive findings. Vascular/Lymphatic: Scattered distal aortic and iliac artery calcifications. No focal aneurysm. Numerous small upper abdominal lymph nodes likely related to cirrhosis. No retroperitoneal adenopathy. Reproductive: The uterus is surgically absent. I believe the right ovary is still present and appears normal. Other: Small amount of free abdominal/pelvic fluid. No pelvic mass or adenopathy. No inguinal mass or adenopathy. Musculoskeletal: No significant bony findings. Advanced degenerative changes involving the spine. IMPRESSION: 1. 5.5 x 5.0 mm left UPJ calculus with high-grade obstructive findings. 2. Normal right kidney and ureter. The bladder is unremarkable  except for a left-sided diverticulum. 3. Suspect cirrhotic changes involving the liver with portal venous hypertension and portal venous collaterals. 4. Cholelithiasis. Suspect gallbladder wall thickening which may be due to cirrhosis. 5. Moderate atherosclerotic calcifications involving the distal aorta and iliac arteries and coronary arteries. Electronically Signed   By: Marijo Sanes M.D.   On: 08/24/2016 12:44   Ir Nephrostomy Placement Left  Result Date: 08/24/2016 INDICATION: 66 year old with sepsis and obstructing stone at the left UPJ. EXAM: PLACEMENT OF LEFT PERCUTANEOUS NEPHROSTOMY TUBE WITH ULTRASOUND AND FLUOROSCOPIC GUIDANCE COMPARISON:  None. MEDICATIONS: Patient is on IV antibiotics. ANESTHESIA/SEDATION: Patient arrived to Intervention Radiology already sedated and intubated. The patient was continuously monitored during the procedure by the interventional radiology nurse under my direct supervision. CONTRAST:  5 mL - administered into the collecting system(s) FLUOROSCOPY TIME:  Fluoroscopy Time: 1 minutes 18 seconds COMPLICATIONS: None immediate. PROCEDURE: Informed written consent was obtained from the patient's husband after a thorough discussion of the procedural risks, benefits and alternatives. All questions were addressed. Maximal Sterile Barrier Technique was utilized including caps, mask, sterile gowns, sterile gloves, sterile drape, hand hygiene and skin antiseptic. A timeout was performed prior to the initiation of the procedure. Patient was placed prone on the interventional table. The left flank was prepped and draped in a sterile fashion. Skin anesthetized with 1% lidocaine. Left kidney was identified with ultrasound. Using ultrasound guidance, 21 gauge needle was directed into a mid/lower pole calyx. Fluoroscopy confirmed that the needle was in the calyx because there was retained contrast from the recent chest CT. Wire was easily advanced into the renal collecting system. Accustick  dilator set was placed. Tract was dilated to accommodate a 10 [place] multipurpose drain. Catheter was attached to gravity bag and sutured to skin. Small amount of blood tinged fluid was sent for culture. FINDINGS: Mild-to-moderate left hydronephrosis. Retained contrast in the collecting system from recent chest CT. Obstructing stone at the UPJ. Nephrostomy tube was successfully advanced into the kidney via  a mid/lower pole calyx. Tube is reconstituted in the renal pelvis. Fluoroscopic and ultrasound images were taken and saved for documentation. IMPRESSION: Successful placement of a left percutaneous nephrostomy tube with ultrasound and fluoroscopic guidance. Electronically Signed   By: Markus Daft M.D.   On: 08/24/2016 15:52    Assessment:   Megan Nixon is a 66 y.o. female admitted with urosepsis from L obstructing nephrolithiasis with ucx, bcx + Klebsiella, now s/p L nephrostomy, improving following period of intubation, pressor requirement and CRRT. She has had removal of all central lines, rectal tube but remains with Elevated wbc up to 51, now trending down. She has gangrenous changes of bil feet and hands. Has seen vascular. Fu bcx and ucx negative.  I suspect her leukocytosis which is out of proportion to her clinical pictures is related to her gangrenous changes. She had recent repeat CT with no evidence of abscess. Has had nephrostomy tube in place.  Recommendations Agree with ancef Monitor wbc Monitor LE and hands for progression to gangrene. Thank you very much for allowing me to participate in the care of this patient. Please call with questions.   Cheral Marker. Ola Spurr, MD

## 2016-09-01 NOTE — Progress Notes (Signed)
Pharmacy Antibiotic Note  Megan Nixon is a 66 y.o. female admitted on 08/24/2016 with septic shock due to pyelonephritis.  Patient was previously on ceftriaxone for Klebsiella pneumoniae UTI and bacteremia. Pharmacy has been consulted for cefazolin dosing.   Plan: Patient off of CRRT and dialysis catheter has been removed. Will initiate cefazolin 2g IV Q24hr and follow up with renal function with am labs.       Height: 5\' 8"  (172.7 cm) Weight: 207 lb 3.7 oz (94 kg) IBW/kg (Calculated) : 63.9  Temp (24hrs), Avg:98.7 F (37.1 C), Min:98.3 F (36.8 C), Max:99.1 F (37.3 C)   Recent Labs Lab 08/29/16 2000 08/29/16 2246 08/30/16 0343 08/30/16 1201 08/31/16 0421 08/31/16 2144 09/01/16 0606  WBC 47.8*  --   --  51.6* 46.9* 47.1* 41.1*  CREATININE  --  1.69* 1.54*  --  2.46* 3.40* 3.56*  LATICACIDVEN  --   --   --   --   --  1.2  --     Estimated Creatinine Clearance: 18.1 mL/min (A) (by C-G formula based on SCr of 3.56 mg/dL (H)).    No Known Allergies  Antimicrobials this admission: Vancomycin 7/1 x 1, 7/6 >> 7/9 Azithromycin 7/1 x 1 Ceftriaxone 7/1 x 1, 7/3 >> 7/4 Zosyn 7/1 >> 7/3 meropenem 7/5 >> 7/9 cefazoline 7/9 >>  Dose adjustments this admission:   Microbiology results: MRSA PCR: negative BCx: Klebsiella pneumoniae UCx: Klebsiella pneumoniae Nephrostomy tube fluid: Klebsiella pneumoniae TA: normal flora C diff: negative  Thank you for allowing pharmacy to be a part of this patient's care.  Simpson,Michael L 09/01/2016 7:22 PM

## 2016-09-01 NOTE — Progress Notes (Signed)
[facility] - [place] at [facility]   PATIENT NAME: Megan JacobsonSarah Nixon    MR#:  [id number]  DATE OF BIRTH:  01-25-1951  SUBJECTIVE:   Extubated  Patient now off Pressors. CRRT off since 08/31/16 Urine output 1010 cc Husband at bedside Answers basic questions well. Denies any pain.  REVIEW OF SYSTEMS:    Review of Systems  Constitutional: Negative for chills, fever and weight loss.  HENT: Negative for ear discharge, ear pain and nosebleeds.   Eyes: Negative for blurred vision, pain and discharge.  Respiratory: Negative for sputum production, shortness of breath, wheezing and stridor.   Cardiovascular: Negative for chest pain, palpitations, orthopnea and PND.  Gastrointestinal: Negative for abdominal pain, diarrhea, nausea and vomiting.  Genitourinary: Negative for frequency and urgency.  Musculoskeletal: Negative for back pain and joint pain.  Neurological: Positive for weakness. Negative for sensory change, speech change and focal weakness.  Psychiatric/Behavioral: Negative for depression and hallucinations. The patient is not nervous/anxious.    DRUG ALLERGIES:  No Known Allergies  VITALS:  Blood pressure (!) 141/76, pulse (!) 108, temperature 98.8 F (37.1 C), temperature source Oral, resp. rate (!) 23, height 5\' 8"  (1.727 m), weight 94 kg (207 lb 3.7 oz), SpO2 94 %.  PHYSICAL EXAMINATION:   Physical Exam  GENERAL:  66 y.o.-year-old patient lying in the bed. Looks critically ill, Limited exam.Extubated EYES: Pupils equal, round, reactive to light HEENT: Head atraumatic, normocephalic. Oropharynx and nasopharynx clear. ET tube in place NECK:  Supple, no jugular venous distention. No thyroid enlargement, no tenderness.  LUNGS: Bilateral decreased breath sounds bases. No use of accessory muscles. CARDIOVASCULAR: S1, S2 normal. Tachycardia, no murmur ABDOMEN: Soft, nontender, nondistended. Bowel sounds present. No organomegaly or mass. Foley catheter in  place EXTREMITIES: Patient does have cyanosis/ischemia bilateral lower extremity left more than right 3+ pitting edema both lower extremities    Cerebrovascular system moves all extremities well. Generalized deconditioning. Nonfocal grossly.;   LABORATORY PANEL:   CBC  Recent Labs Lab 09/01/16 0606  WBC 41.1*  HGB 8.8*  HCT 26.1*  PLT 93*   ------------------------------------------------------------------------------------------------------------------ Chemistries   Recent Labs Lab 08/31/16 0421  09/01/16 0606  NA 139  < > 138  K 4.3  < > 3.8  CL 107  < > 105  CO2 27  < > 24  GLUCOSE 142*  < > 126*  BUN 56*  < > 76*  CREATININE 2.46*  < > 3.56*  CALCIUM 8.2*  < > 8.4*  MG 1.9  --  2.1  AST 168*  --   --   ALT 73*  --   --   ALKPHOS 102  --   --   BILITOT 1.0  --   --   < > = values in this interval not displayed. ------------------------------------------------------------------------------------------------------------------  Cardiac Enzymes  Recent Labs Lab 08/28/16 0420  TROPONINI 0.72*   ------------------------------------------------------------------------------------------------------------------  RADIOLOGY:  Ct Abdomen Pelvis Wo Contrast  Result Date: 08/30/2016 CLINICAL DATA:  Left UPJ obstruction with Klebsiella sepsis. EXAM: CT ABDOMEN AND PELVIS WITHOUT CONTRAST TECHNIQUE: Multidetector CT imaging of the abdomen and pelvis was performed following the standard protocol without IV contrast. COMPARISON:  08/24/2016 FINDINGS: Lower chest: Interval progression of bilateral lower lobe collapse/ consolidation with slight increase in small bilateral pleural effusions. Hepatobiliary: Nodular contour of the liver suggest cirrhosis. Layering tiny calcified gallstones evident. There may be a stone in the cystic duct or common duct (see image 25 series 2). No intrahepatic or extrahepatic biliary  dilation. Pancreas: No focal mass lesion. No dilatation of the main  duct. No intraparenchymal cyst. No peripancreatic edema. Spleen: No splenomegaly. No focal mass lesion. Adrenals/Urinary Tract: No adrenal nodule or mass. Right kidney and ureter are unremarkable. Status post left percutaneous nephrostomy tube placement with no evidence for residual left hydronephrosis. 6 mm right UPJ stone seen on the prior study is no longer evident although a 6 mm stone is identified in the renal pelvis adjacent to the formed loop of the percutaneous nephrostomy tube. Another 6 mm stone is identified in the lower pole the left kidney. Both of these stones are well seen on coronal image 58 of series 5. No left ureteral stone. Foley catheter decompresses the urinary bladder. Stomach/Bowel: Stomach is nondistended. No gastric wall thickening. No evidence of outlet obstruction. Duodenum is normally positioned as is the ligament of Treitz. No small bowel wall thickening. No small bowel dilatation. The terminal ileum is normal. The appendix is not visualized, but there is no edema or inflammation in the region of the cecum. Rectal tube noted. Vascular/Lymphatic: There is abdominal aortic atherosclerosis without aneurysm. There is no gastrohepatic or hepatoduodenal ligament lymphadenopathy. No intraperitoneal or retroperitoneal lymphadenopathy. Recanalization of the paraumbilical vein is evident. No pelvic sidewall lymphadenopathy. Reproductive: Uterus surgically absent.  There is no adnexal mass. Other: Small volume intraperitoneal free fluid. Diffuse body wall edema noted. Musculoskeletal: Bone windows reveal no worrisome lytic or sclerotic osseous lesions. Intramuscular hematoma may be present in the right lateral abdominal wall musculature (see image 37 series 2). IMPRESSION: 1. Interval placement of left percutaneous nephrostomy tube with no evidence for left hydronephrosis on today's exam. The UPJ stone seen on the previous study is no longer evident although this stone may now be in the central  renal pelvis. Another 6 mm stone is identified in the lower pole left kidney. 2. Cholelithiasis with tiny stone in the porta hepatis, potentially in the cystic duct or common duct. 3. Cirrhotic changes in the liver with recanalization of the paraumbilical vein suggesting portal venous hypertension. 4. Small volume intraperitoneal free fluid with diffuse body wall edema. Focal fluid/edema attenuation in the right lateral wall abdominal musculature raises the question of hematoma. 5. Interval increase in bibasilar collapse/consolidation with small bilateral pleural effusions. 6.  Aortic Atherosclerois (ICD10-170.0) Electronically Signed   By: Kennith Center M.D.   On: 08/30/2016 15:01     ASSESSMENT AND PLAN:   * Septic shockDue to Klebsiella bacteremia source urine Patient now extubated -Off IV pressors -White count up to 38,000---47,000--51,000---46K--41K - No fever -CT abdomen shows no abcess - Spoke with Dr. Lonn Georgia. CT abdomen today. -On IV Solu-Cortef stress dose-- Now hemodynamically stable. Will DC Solu-Cortef -C diff negative on 08/26/16, rectal tube removed.  * Left pyelonephritis with UPJ calculus and hydro-nephrosisStatus post percutaneous nephrostomy tube placement on admission  -urine culture positive for Klebsiella -Patient was on IV Rocephin----now on IV meropenem and vancomycin -ID consultation with dr Sampson Goon -CT abdomen did not show any any fluid collection/abscess.  * Acute kidney injury/pain unit secondary to septic shock ATN -On CRRT--- which is discontinued on 08/30/2016 -Dialysis catheter removed. Central line removed.  * ARDS with Acute hypoxic respiratory failure --Status post mechanical ventilator now extubated   *Severe DIC due to #1 -Patient receiving platelets and FFP's according to her labs -Platelet count 79,000 much better. No active bleeding.  *Bilateral lower extremity ischemia more on the left than right in the setting of sepsis/DIC  -Vascular  recommendations noted. Patient  has good peripheral pedal pulses.  Patient is critically ill however improving slowly.  Transfer to med surg floor PT initiated  All the records are reviewed and case discussed with Care Management/Social Workerr. Management plans discussed with the patient, family and they are in agreement.  CODE STATUS: FULL CODE  DVT Prophylaxis: SCDs  TOTAL TIME TAKING CARE OF THIS PATIENT: 25 minutes.    Reilyn Nelson M.D on 09/01/2016 at 1:05 PM  Between 7am to 6pm - Pager - [phone]  After 6pm go to [email or web address] - password [email or web address]  [facility] [place] [facility]  Office  [phone]  CC: Primary care physician; Danella Penton, MD  Note: This dictation was prepared with Dragon dictation along with smaller phrase technology. Any transcriptional errors that result from this process are unintentional.

## 2016-09-02 LAB — CBC
HEMATOCRIT: 25.6 % — AB (ref 35.0–47.0)
HEMOGLOBIN: 8.2 g/dL — AB (ref 12.0–16.0)
MCH: 27.9 pg (ref 26.0–34.0)
MCHC: 32.2 g/dL (ref 32.0–36.0)
MCV: 86.7 fL (ref 80.0–100.0)
Platelets: 107 10*3/uL — ABNORMAL LOW (ref 150–440)
RBC: 2.96 MIL/uL — AB (ref 3.80–5.20)
RDW: 16.9 % — ABNORMAL HIGH (ref 11.5–14.5)
WBC: 34.8 10*3/uL — AB (ref 3.6–11.0)

## 2016-09-02 LAB — RENAL FUNCTION PANEL
ALBUMIN: 2.5 g/dL — AB (ref 3.5–5.0)
Anion gap: 9 (ref 5–15)
BUN: 93 mg/dL — AB (ref 6–20)
CALCIUM: 8.3 mg/dL — AB (ref 8.9–10.3)
CHLORIDE: 107 mmol/L (ref 101–111)
CO2: 25 mmol/L (ref 22–32)
CREATININE: 4.15 mg/dL — AB (ref 0.44–1.00)
GFR calc Af Amer: 12 mL/min — ABNORMAL LOW (ref >60)
GFR, EST NON AFRICAN AMERICAN: 10 mL/min — AB (ref >60)
Glucose, Bld: 114 mg/dL — ABNORMAL HIGH (ref 65–99)
Phosphorus: 7.2 mg/dL — ABNORMAL HIGH (ref 2.5–4.6)
Potassium: 4.1 mmol/L (ref 3.5–5.1)
SODIUM: 141 mmol/L (ref 135–145)

## 2016-09-02 LAB — GLUCOSE, CAPILLARY
GLUCOSE-CAPILLARY: 132 mg/dL — AB (ref 65–99)
GLUCOSE-CAPILLARY: 167 mg/dL — AB (ref 65–99)
GLUCOSE-CAPILLARY: 119 mg/dL — AB (ref 65–99)
Glucose-Capillary: 116 mg/dL — ABNORMAL HIGH (ref 65–99)
Glucose-Capillary: 130 mg/dL — ABNORMAL HIGH (ref 65–99)

## 2016-09-02 MED ORDER — CEFAZOLIN SODIUM-DEXTROSE 1-4 GM/50ML-% IV SOLN
1.0000 g | Freq: Two times a day (BID) | INTRAVENOUS | Status: DC
  Administered 2016-09-02 – 2016-09-03 (×3): 1 g via INTRAVENOUS
  Filled 2016-09-02 (×6): qty 50

## 2016-09-02 MED ORDER — CEFAZOLIN SODIUM-DEXTROSE 1-4 GM/50ML-% IV SOLN
1.0000 g | INTRAVENOUS | Status: DC
  Filled 2016-09-02: qty 50

## 2016-09-02 NOTE — Progress Notes (Signed)
[facility] - [place] at [facility]   PATIENT NAME: Tanish Sinkler    MR#:  [id number]  DATE OF BIRTH:  1950-07-27  SUBJECTIVE:   Extubated  Patient now off Pressors. CRRT off since 08/31/16 Urine output 1010 cc Husband at bedside Answers basic questions well. Denies any pain.  REVIEW OF SYSTEMS:    Review of Systems  Constitutional: Negative for chills, fever and weight loss.  HENT: Negative for ear discharge, ear pain and nosebleeds.   Eyes: Negative for blurred vision, pain and discharge.  Respiratory: Negative for sputum production, shortness of breath, wheezing and stridor.   Cardiovascular: Negative for chest pain, palpitations, orthopnea and PND.  Gastrointestinal: Negative for abdominal pain, diarrhea, nausea and vomiting.  Genitourinary: Negative for frequency and urgency.  Musculoskeletal: Negative for back pain and joint pain.  Neurological: Positive for weakness. Negative for sensory change, speech change and focal weakness.  Psychiatric/Behavioral: Negative for depression and hallucinations. The patient is not nervous/anxious.    DRUG ALLERGIES:  No Known Allergies  VITALS:  Blood pressure (!) 145/80, pulse (!) 104, temperature 97.6 F (36.4 C), temperature source Oral, resp. rate (!) 21, height 5\' 8"  (1.727 m), weight 95.9 kg (211 lb 6.7 oz), SpO2 99 %.  PHYSICAL EXAMINATION:   Physical Exam  GENERAL:  66 y.o.-year-old patient lying in the bed. Looks critically ill, Limited exam.Extubated EYES: Pupils equal, round, reactive to light HEENT: Head atraumatic, normocephalic. Oropharynx and nasopharynx clear. ET tube in place NECK:  Supple, no jugular venous distention. No thyroid enlargement, no tenderness.  LUNGS: Bilateral decreased breath sounds bases. No use of accessory muscles. CARDIOVASCULAR: S1, S2 normal. Tachycardia, no murmur ABDOMEN: Soft, nontender, nondistended. Bowel sounds present. No organomegaly or mass.  EXTREMITIES: Patient  does have cyanosis/ischemia bilateral lower extremity left more than right 3+ pitting edema both lower extremities    Cerebrovascular system moves all extremities well. Generalized deconditioning. Nonfocal grossly.; weak PSYCHIATRY: flat affect  LABORATORY PANEL:   CBC  Recent Labs Lab 09/02/16 0323  WBC 34.8*  HGB 8.2*  HCT 25.6*  PLT 107*   ------------------------------------------------------------------------------------------------------------------ Chemistries   Recent Labs Lab 08/31/16 0421  09/01/16 0606 09/02/16 0322  NA 139  < > 138 141  K 4.3  < > 3.8 4.1  CL 107  < > 105 107  CO2 27  < > 24 25  GLUCOSE 142*  < > 126* 114*  BUN 56*  < > 76* 93*  CREATININE 2.46*  < > 3.56* 4.15*  CALCIUM 8.2*  < > 8.4* 8.3*  MG 1.9  --  2.1  --   AST 168*  --   --   --   ALT 73*  --   --   --   ALKPHOS 102  --   --   --   BILITOT 1.0  --   --   --   < > = values in this interval not displayed. ------------------------------------------------------------------------------------------------------------------  Cardiac Enzymes  Recent Labs Lab 08/28/16 0420  TROPONINI 0.72*   ------------------------------------------------------------------------------------------------------------------  RADIOLOGY:  No results found.   ASSESSMENT AND PLAN:   * Septic shockDue to Klebsiella bacteremia source urine Patient now extubated -Off IV pressors -White count up to 38,000---47,000--51,000---46K--41K---34K - No fever -CT abdomen shows no abcess - Spoke with Dr. Lonn Georgia. CT abdomen today. -On IV Solu-Cortef stress dose-- Now hemodynamically stable. Will DC Solu-Cortef -C diff negative on 08/26/16, rectal tube removed.  * Left pyelonephritis with UPJ calculus and hydro-nephrosisStatus post percutaneous  nephrostomy tube placement on admission  -urine culture positive for Klebsiella -Patient was on IV Rocephin----was on IV meropenem and vancomycin---now on IV cefazolin -ID  consultation with dr Sampson GoonFitzgerald appreciated -CT abdomen did not show any any fluid collection/abscess.  * Acute kidney injury/pain unit secondary to septic shock ATN -On CRRT--- which is discontinued on 08/30/2016 -Dialysis catheter removed. Central line removed.  * ARDS with Acute hypoxic respiratory failure --Status post mechanical ventilator now extubated   *Severe DIC due to #1 -Patient receiving platelets and FFP's according to her labs -Platelet count 79,000 much better. No active bleeding.  *Bilateral lower extremity ischemia more on the left than right in the setting of sepsis/DIC  -Vascular recommendations noted. Patient has good peripheral pedal pulses.  Patient is critically ill however improving slowly.  Transfer to med surg floor PT initiated  All the records are reviewed and case discussed with Care Management/Social Workerr. Management plans discussed with the patient, family and they are in agreement.  CODE STATUS: FULL CODE  DVT Prophylaxis: SCDs  TOTAL TIME TAKING CARE OF THIS PATIENT: 25 minutes.    Ritisha Deitrick M.D on 09/02/2016 at 11:09 AM  Between 7am to 6pm - Pager - [phone]  After 6pm go to [email or web address] - password [email or web address]  [facility] [place] [facility]  Office  [phone]  CC: Primary care physician; Danella PentonMiller, Mark F, MD  Note: This dictation was prepared with Dragon dictation along with smaller phrase technology. Any transcriptional errors that result from this process are unintentional.

## 2016-09-02 NOTE — Evaluation (Signed)
Occupational Therapy Evaluation Patient Details Name: Megan Nixon MRN: [id number] DOB: 05/06/1950 Today's Date: 09/02/2016    History of Present Illness Patient is a 66 y.o. female admitted on 01 JUL with septic shock. Patient confused and found to have UTI and multifocal pneumonia. PMH includes HTN, DMII, kidney stones, and CVA.   Clinical Impression   Pt seen for OT evaluation this date. Pt was independent with ADL, IADL including driving at baseline, attending [employer] 3x/wk. Pt lives in a 1 story home with basement, with the least restrictive way into the home through the basement entry level with 1 flight of stairs (approx 15 steps) up to main floor of the house. Pt lives with her spouse who is willing/able to provide 24/7 assistance as needed. Pt very eager and motivated to participate in therapy, although fatigued from earlier therapy sessions. Pt agreeable to doing bed level exercises. Pt presents with generalized weakness, pitting edema in all extremities, and decreased activity tolerance. Pt would benefit from skilled OT services in order to address noted impairments and functional deficits in bed mobility, transfers, bathing, dressing, and toileting as well as education/training in energy conservation strategies and home/routine modifications to maximize safety and return to PLOF. Recommend in-patient rehabilitation prior to return home.    Follow Up Recommendations  CIR    Equipment Recommendations  Other (comment) (rec spouse install grab bars in shower)    Recommendations for Other Services       Precautions / Restrictions Precautions Precautions: Fall Restrictions Weight Bearing Restrictions: No      Mobility Bed Mobility   General bed mobility comments: pt declined bed mobility during session due to fatigue and up with nursing and then with PT earlier  Transfers       General transfer comment: pt declined bed mobility during session due to fatigue  and up with nursing and then with PT earlier    Balance Overall balance assessment: Needs assistance                           ADL either performed or assessed with clinical judgement   ADL Overall ADL's : Needs assistance/impaired Eating/Feeding: Set up;Bed level   Grooming: Bed level;Set up   Upper Body Bathing: Minimal assistance;Bed level   Lower Body Bathing: Moderate assistance;Bed level   Upper Body Dressing : Set up;Minimal assistance;Bed level   Lower Body Dressing: Bed level;Moderate assistance                 General ADL Comments: pt generally mod assist for LB ADL, fatigues quickly     Vision Baseline Vision/History: Wears glasses Wears Glasses: Reading only Patient Visual Report: No change from baseline Vision Assessment?: No apparent visual deficits     Perception     Praxis      Pertinent Vitals/Pain Pain Assessment: No/denies pain     Hand Dominance Right   Extremity/Trunk Assessment Upper Extremity Assessment Upper Extremity Assessment: Generalized weakness (ROM WFL, unable to hold arms up against gravity for more than ~10 seconds, grossly 4-/5, fair grip strength)   Lower Extremity Assessment Lower Extremity Assessment: Generalized weakness   Cervical / Trunk Assessment Cervical / Trunk Assessment: Normal   Communication Communication Communication: No difficulties   Cognition Arousal/Alertness: Awake/alert Behavior During Therapy: WFL for tasks assessed/performed Overall Cognitive Status: Within Functional Limits for tasks assessed  General Comments: A&Ox4, follows all commands, slight delay in response time to questions, but appropriate throughout and motivated to participate   General Comments  3+ pitting edema in BUE/BLE, not painful, no sensory deficits per pt report    Exercises General Exercises - Upper Extremity Shoulder Flexion: AROM;Strengthening;Both;10 reps (1lb  weight) Elbow Flexion: AROM;Strengthening;Both;15 reps (1lb weight)  General Exercises - Lower Extremity Ankle Circles/Pumps: AROM;Both;10 reps;Supine Heel Slides: AROM;Strengthening;Both;10 reps;Supine  Other Exercises Other Exercises: pt/family educated in bed level therapeutic exercises BUE/BLE to perform throughout the day, pt verbalized understanding   Shoulder Instructions      Home Living Family/patient expects to be discharged to:: Private residence Living Arrangements: Spouse/significant other Available Help at Discharge: Family;Available 24 hours/day (spouse available 24/7) Type of Home: House Home Access: Stairs to enter Enter[employer] CorporationEntrance Stairs-Number of Steps: 15 steps from basement (entry level) up to main floor of the home (least restrictive way to enter the home)   Home Layout: One level;Laundry or work area in [employer] Third Bancorpbasement     Bathroom Shower/Tub: Tub/shower unit;Walk-in shower (typically uses the garden tub)   FirefighterBathroom Toilet: Handicapped height     Home Equipment: None   Additional Comments: pt noted she may have a shower chair accessible to her      Prior Functioning/Environment Level of Independence: Independent        Comments: Patient was independent, attending [employer] 3x/week, driving        OT Problem List: Decreased strength;Cardiopulmonary status limiting activity;Increased edema;Decreased activity tolerance;Decreased knowledge of use of DME or AE;Impaired UE functional use      OT Treatment/Interventions: Self-care/ADL training;Therapeutic exercise;Therapeutic activities;Energy conservation;DME and/or AE instruction;Patient/family education    OT Goals(Current goals can be found in the care plan section) Acute Rehab OT Goals Patient Stated Goal: "To get stronger" OT Goal Formulation: With patient/family Time For Goal Achievement: 09/16/16 Potential to Achieve Goals: Good  OT Frequency: Min 3X/week   Barriers to D/C: Inaccessible home  environment  would need to be able to go up/down 15 steps       Co-evaluation              AM-PAC PT "6 Clicks" Daily Activity     Outcome Measure Help from another person eating meals?: None Help from another person taking care of personal grooming?: None Help from another person toileting, which includes using toliet, bedpan, or urinal?: A Lot Help from another person bathing (including washing, rinsing, drying)?: A Lot Help from another person to put on and taking off regular upper body clothing?: A Little Help from another person to put on and taking off regular lower body clothing?: A Lot 6 Click Score: 17   End of Session    Activity Tolerance: Patient tolerated treatment well Patient left: in bed;with call bell/phone within reach;with bed alarm set;with family/visitor present  OT Visit Diagnosis: Other abnormalities of gait and mobility (R26.89);Muscle weakness (generalized) (M62.81)                Time: [id number] OT Time Calculation (min): 24 min Charges:  OT General Charges $OT Visit: 1 Procedure OT Evaluation $OT Eval Low Complexity: 1 Procedure OT Treatments $Therapeutic Exercise: 8-22 mins G-Codes:     Richrd PrimeJamie Stiller, MPH, MS, OTR/L ascom [phone] 09/02/16, 2:47 PM

## 2016-09-02 NOTE — Progress Notes (Signed)
Physical Therapy Treatment Patient Details Name: Megan Nixon MRN: [id number] DOB: 04-17-1950 Today's Date: 09/02/2016    History of Present Illness Patient is a 66 y.o. female admitted on 01 JUL with septic shock. Patient confused and found to have UTI and multifocal pneumonia. PMH includes HTN, DMII, kidney stones, and CVA.    PT Comments    Pt continues to demonstrate high motivation to work with therapy and get stronger. Husband is present and very encouraging during session. Pt able to complete all bed exercises as instructed. She continues to demonstrate good sitting balance and is able to complete all supine and seated exercises. Pt performed multiple sit to stand transfers with rolling walker. ModA+1 for transfers. After first transfer performed static standing balance to improve endurance. Progressed to marching in standing. Pt with intermittent knee/hip buckling which requires therapist to assist hips into extension to remain upright. After second transfer pt able to perform static standing balance for 15-20 seconds followed by sidestepping up toward [facility] with therapist. Therapist providing assist to advance walker and maintain hips in extension present for added safety. Cues required for safe hand placement with transfers and proper sequencing with side stepping. VSS throughout side stepping and pt reports mild DOE. Decided with patient to remain in bed for lunch as she has already been up in recliner earlier today with staff assisting for transfer. Pt will benefit from PT services to address deficits in strength, balance, and mobility in order to return to full function at home.     Follow Up Recommendations  CIR     Equipment Recommendations  Rolling walker with 5" wheels;Other (comment) (Further DME TBD)    Recommendations for Other Services Rehab consult     Precautions / Restrictions Precautions Precautions: Fall Restrictions Weight Bearing Restrictions: No     Mobility  Bed Mobility Overal bed mobility: Needs Assistance Bed Mobility: Supine to Sit;Sit to Supine     Supine to sit: Min assist Sit to supine: Min assist   General bed mobility comments: Pt requires cues for rolling and assist to reach bed rail with RUE. Cues to push up from L sidelying to sitting. Once up at EOB she is steady and stable in sitting. Denies DOE with bed mobility and SaO2 remains >90% on supplemental O2. HR under better control today running 100-105 bpm.   Transfers Overall transfer level: Needs assistance Equipment used: Rolling walker (2 wheeled) Transfers: Sit to/from Stand Sit to Stand: Mod assist         General transfer comment: Pt performed multiple sit to stand transfers with rolling walker. ModA+1 for transfers. After first transfer performed static standing balance to improve endurance. Progressed to marching in standing. Pt with intermittent knee/hip buckling which requires therapist to assist hips into extension to remain upright. After second transfer pt able to perform static standing balance for 15-20 seconds followed by sidestepping up toward [facility] with therapist. Therapist providing assist to advance walker and maintain hips in extension present for added safety. Cues required for safe hand placement with transfers and proper sequencing with side stepping. VSS throughout side stepping and pt reports mild DOE. Decided with patient to remain in bed for lunch as she has already been up in recliner earlier today with staff assisting for transfer.  Ambulation/Gait             General Gait Details: Pt able to perform side stepping at EOB. Unsafe to attempt forward ambulation currently due to frequent knee/hip buckling with pre-gait  activites at EOB.   Stairs            Wheelchair Mobility    Modified Rankin (Stroke Patients Only)       Balance Overall balance assessment: Needs assistance Sitting-balance support: Bilateral upper  extremity supported Sitting balance-Leahy Scale: Fair     Standing balance support: Bilateral upper extremity supported Standing balance-Leahy Scale: Fair Standing balance comment: Requires continual UE support on rolling walker. Balance primarily impaired by bilateral LE weakness                            Cognition Arousal/Alertness: Awake/alert Behavior During Therapy: WFL for tasks assessed/performed Overall Cognitive Status: Within Functional Limits for tasks assessed                                        Exercises General Exercises - Lower Extremity Ankle Circles/Pumps: AROM;Both;10 reps;Supine Quad Sets: Strengthening;Both;10 reps;Supine Gluteal Sets: Strengthening;Both;10 reps;Supine Long Arc Quad: Strengthening;Both;10 reps;Seated Heel Slides: Strengthening;Both;10 reps;Supine (sitting resisted knee flexion x 10 each) Hip ABduction/ADduction: Strengthening;Both;10 reps;Supine Straight Leg Raises: Strengthening;Both;10 reps;Supine Hip Flexion/Marching: Strengthening;Both;10 reps;Seated Other Exercises Other Exercises: Standing marches performed at EOB x 30 seconds    General Comments        Pertinent Vitals/Pain Pain Assessment: No/denies pain    Home Living                      Prior Function            PT Goals (current goals can now be found in the care plan section) Acute Rehab PT Goals Patient Stated Goal: "To get stronger" PT Goal Formulation: With patient/family Time For Goal Achievement: 09/14/16 Potential to Achieve Goals: Good Progress towards PT goals: Progressing toward goals    Frequency    Min 2X/week      PT Plan Current plan remains appropriate    Co-evaluation              AM-PAC PT "6 Clicks" Daily Activity  Outcome Measure  Difficulty turning over in bed (including adjusting bedclothes, sheets and blankets)?: A Little Difficulty moving from lying on back to sitting on the side of  the bed? : Total Difficulty sitting down on and standing up from a chair with arms (e.g., wheelchair, bedside commode, etc,.)?: Total Help needed moving to and from a bed to chair (including a wheelchair)?: A Lot Help needed walking in hospital room?: A Lot Help needed climbing 3-5 steps with a railing? : Total 6 Click Score: 10    End of Session Equipment Utilized During Treatment: Gait belt;Oxygen Activity Tolerance: Patient tolerated treatment well Patient left: with call bell/phone within reach;with family/visitor present;in bed Nurse Communication: Mobility status;Other (comment) (Requesting bedpan) PT Visit Diagnosis: Unsteadiness on feet (R26.81);Muscle weakness (generalized) (M62.81);Difficulty in walking, not elsewhere classified (R26.2)     Time: [id number] PT Time Calculation (min) (ACUTE ONLY): 26 min  Charges:  $Therapeutic Exercise: 8-22 mins $Therapeutic Activity: 8-22 mins                    G Codes:       Sharalyn InkJason D Huprich PT, DPT     Huprich,Jason 09/02/2016, 1:49 PM

## 2016-09-02 NOTE — Progress Notes (Signed)
Vital signs reviewed PCCM will continue to manage patient until she is  transferred to the floor

## 2016-09-02 NOTE — Progress Notes (Addendum)
Pharmacy Antibiotic Note  Megan FerdinandSarah S Cappiello is a 66 y.o. female admitted on 08/24/2016 with septic shock due to pyelonephritis.  Patient was previously on ceftriaxone for Klebsiella pneumoniae UTI and bacteremia. Pharmacy has been consulted for cefazolin dosing.   Plan: Sercum creatine worsened overnight, will transition patient to cefazolin 1g IV Q12hr.   Height: 5\' 8"  (172.7 cm) Weight: 211 lb 6.7 oz (95.9 kg) IBW/kg (Calculated) : 63.9  Temp (24hrs), Avg:98 F (36.7 C), Min:97.6 F (36.4 C), Max:98.3 F (36.8 C)   Recent Labs Lab 08/30/16 0343 08/30/16 1201 08/31/16 0421 08/31/16 2144 09/01/16 0606 09/02/16 0322 09/02/16 0323  WBC  --  51.6* 46.9* 47.1* 41.1*  --  34.8*  CREATININE 1.54*  --  2.46* 3.40* 3.56* 4.15*  --   LATICACIDVEN  --   --   --  1.2  --   --   --     Estimated Creatinine Clearance: 15.7 mL/min (A) (by C-G formula based on SCr of 4.15 mg/dL (H)).    No Known Allergies  Antimicrobials this admission: Vancomycin 7/1 x 1, 7/6 >> 7/9 Azithromycin 7/1 x 1 Ceftriaxone 7/1 x 1, 7/3 >> 7/4 Zosyn 7/1 >> 7/3 meropenem 7/5 >> 7/9 cefazoline 7/9 >>  Dose adjustments this admission: 7/10 Cefazolin transitioned to 1g Q24hr.   Microbiology results: MRSA PCR: negative BCx: Klebsiella pneumoniae UCx: Klebsiella pneumoniae Nephrostomy tube fluid: Klebsiella pneumoniae TA: normal flora C diff: negative  Thank you for allowing pharmacy to be a part of this patient's care.  Simpson,Michael L 09/02/2016 11:17 AM

## 2016-09-02 NOTE — Progress Notes (Signed)
[facility]  ROUNDING NOTE   Subjective:   Patient remains critically ill States she is able to tolerate clears. No N/V reported Urine output 1095 cc last 24 hours S Cr and BUN are slightly higher  Objective:  Vital signs in last 24 hours:  Temp:  [97.6 F (36.4 C)-98.2 F (36.8 C)] 97.6 F (36.4 C) (07/10 0818) Pulse Rate:  [85-104] 104 (07/10 0818) Resp:  [19-39] 21 (07/10 0818) BP: (123-149)/(74-92) 145/80 (07/10 0818) SpO2:  [91 %-100 %] 99 % (07/10 0818) Weight:  [95.9 kg (211 lb 6.7 oz)] 95.9 kg (211 lb 6.7 oz) (07/10 0400)  Weight change:  Filed Weights   08/31/16 0000 08/31/16 0340 09/02/16 0400  Weight: 94 kg (207 lb 3.7 oz) 94 kg (207 lb 3.7 oz) 95.9 kg (211 lb 6.7 oz)    Intake/Output: I/O last 3 completed shifts: In: 605 [P.O.:120; Other:285; IV Piggyback:200] Out: 1655 [Urine:1555; Stool:100]   Intake/Output this shift:  Total I/O In: 130 [P.O.:120; I.V.:10] Out: 350 [Urine:350]  Physical Exam: General: Ill appearing  Head: [place]/AT  Eyes: Anicteric,  Moist mucus membranes  Neck:  supple  Lungs:  Clear anteriorly and laterally, mild basilar crackles at bases  Heart: tachycardia  Abdomen:  Soft, nontender, obese  Extremities: + dependent edema  Neurologic: Alert, able to answer questions  Skin: Purplish lesions over toes and some fingers         Basic Metabolic Panel:  Recent Labs Lab 08/29/16 1607 08/29/16 2246 08/30/16 0343 08/31/16 0421 08/31/16 2144 09/01/16 0606 09/02/16 0322  NA 138 139 139 139 140 138 141  K 4.2 3.9 4.5 4.3 3.5 3.8 4.1  CL 105 107 106 107 106 105 107  CO2 27 27 27 27 24 24 25   GLUCOSE 155* 115* 139* 142* 126* 126* 114*  BUN 46* 41* 39* 56* 73* 76* 93*  CREATININE 1.89* 1.69* 1.54* 2.46* 3.40* 3.56* 4.15*  CALCIUM 8.3* 8.2* 8.5* 8.2* 8.6* 8.4* 8.3*  MG 1.8 1.7 1.7 1.9  --  2.1  --   PHOS 2.9 2.6 3.0 4.1  --  5.4*  5.5* 7.2*    Liver Function Tests:  Recent Labs Lab 08/27/16 0505   08/28/16 0420  08/29/16 2246 08/30/16 0343 08/31/16 0421 09/01/16 0606 09/02/16 0322  AST 69*  --  111*  --   --   --  168*  --   --   ALT 55*  --  57*  --   --   --  73*  --   --   ALKPHOS 98  --  130*  --   --   --  102  --   --   BILITOT 0.7  --  0.6  --   --   --  1.0  --   --   PROT 5.5*  --  5.3*  --   --   --  5.3*  --   --   ALBUMIN 2.2*  < > 2.0*  < > 2.1* 2.3* 2.2* 2.4* 2.5*  < > = values in this interval not displayed. No results for input(s): LIPASE, AMYLASE in the last 168 hours. No results for input(s): AMMONIA in the last 168 hours.  CBC:  Recent Labs Lab 08/27/16 0620 08/28/16 0420 08/29/16 0345  08/30/16 1201 08/31/16 0421 08/31/16 2144 09/01/16 0606 09/02/16 0323  WBC 31.3* 34.0* 38.8*  < > 51.6* 46.9* 47.1* 41.1* 34.8*  NEUTROABS 28.2* 30.7* 31.4*  --  44.9*  --   --   --   --  HGB 9.9* 9.8* 8.8*  < > 8.6* 8.2* 8.7* 8.8* 8.2*  HCT 30.1* 29.8* 27.2*  < > 26.6* 24.5* 26.8* 26.1* 25.6*  MCV 87.2 88.6 87.5  < > 86.3 87.1 85.1 86.8 86.7  PLT 18* 17* 27*  < > 68* 79* 93* 93* 107*  < > = values in this interval not displayed.  Cardiac Enzymes:  Recent Labs Lab 08/28/16 0420  TROPONINI 0.72*    BNP: Invalid input(s): POCBNP  CBG:  Recent Labs Lab 09/01/16 1956 09/01/16 2344 09/02/16 0406 09/02/16 0749 09/02/16 1223  GLUCAP 116* 105* 116* 132* 130*    Microbiology: Results for orders placed or performed during the hospital encounter of 08/24/16  Blood Culture (routine x 2)     Status: Abnormal   Collection Time: 08/24/16  8:25 AM  Result Value Ref Range Status   Specimen Description BLOOD RIGHT ANTECUBITAL  Final   Special Requests   Final    BOTTLES DRAWN AEROBIC AND ANAEROBIC Blood Culture adequate volume   Culture  Setup Time   Final    GRAM NEGATIVE RODS IN BOTH AEROBIC AND ANAEROBIC BOTTLES CRITICAL RESULT CALLED TO, READ BACK BY AND VERIFIED WITH: JASON ROBBINS ON 08/24/16 AT 1933 QSD    Culture (A)  Final    KLEBSIELLA  PNEUMONIAE SUSCEPTIBILITIES PERFORMED ON PREVIOUS CULTURE WITHIN THE LAST 5 DAYS. Performed at [facility], [address] [place], [place] [address]    Report Status 08/27/2016 FINAL  Final  Blood Culture (routine x 2)     Status: Abnormal   Collection Time: 08/24/16  8:25 AM  Result Value Ref Range Status   Specimen Description BLOOD RIGHT ANTECUBITAL  Final   Special Requests   Final    BOTTLES DRAWN AEROBIC AND ANAEROBIC Blood Culture adequate volume   Culture  Setup Time   Final    GRAM NEGATIVE RODS IN BOTH AEROBIC AND ANAEROBIC BOTTLES CRITICAL RESULT CALLED TO, READ BACK BY AND VERIFIED WITH: JASON ROBBINS ON 08/24/16 AT 1933 QSD    Culture KLEBSIELLA PNEUMONIAE (A)  Final   Report Status 08/27/2016 FINAL  Final   Organism ID, Bacteria KLEBSIELLA PNEUMONIAE  Final      Susceptibility   Klebsiella pneumoniae - MIC*    AMPICILLIN >=32 RESISTANT Resistant     CEFAZOLIN <=4 SENSITIVE Sensitive     CEFEPIME <=1 SENSITIVE Sensitive     CEFTAZIDIME <=1 SENSITIVE Sensitive     CEFTRIAXONE <=1 SENSITIVE Sensitive     CIPROFLOXACIN <=0.25 SENSITIVE Sensitive     GENTAMICIN <=1 SENSITIVE Sensitive     IMIPENEM <=0.25 SENSITIVE Sensitive     TRIMETH/SULFA <=20 SENSITIVE Sensitive     AMPICILLIN/SULBACTAM 8 SENSITIVE Sensitive     PIP/TAZO <=4 SENSITIVE Sensitive     Extended ESBL NEGATIVE Sensitive     * KLEBSIELLA PNEUMONIAE  Blood Culture ID Panel (Reflexed)     Status: Abnormal   Collection Time: 08/24/16  8:25 AM  Result Value Ref Range Status   Enterococcus species NOT DETECTED NOT DETECTED Final   Listeria monocytogenes NOT DETECTED NOT DETECTED Final   Staphylococcus species NOT DETECTED NOT DETECTED Final   Staphylococcus aureus NOT DETECTED NOT DETECTED Final   Streptococcus species NOT DETECTED NOT DETECTED Final   Streptococcus agalactiae NOT DETECTED NOT DETECTED Final   Streptococcus pneumoniae NOT DETECTED NOT DETECTED Final   Streptococcus pyogenes NOT  DETECTED NOT DETECTED Final   Acinetobacter baumannii NOT DETECTED NOT DETECTED Final   Enterobacteriaceae species  DETECTED (A) NOT DETECTED Final    Comment: Enterobacteriaceae represent a large family of gram-negative bacteria, not a single organism. CRITICAL RESULT CALLED TO, READ BACK BY AND VERIFIED WITH: JASON ROBBINS ON 08/24/16 AT 1933 QSD    Enterobacter cloacae complex NOT DETECTED NOT DETECTED Final   Escherichia coli NOT DETECTED NOT DETECTED Final   Klebsiella oxytoca NOT DETECTED NOT DETECTED Final   Klebsiella pneumoniae DETECTED (A) NOT DETECTED Final    Comment: CRITICAL RESULT CALLED TO, READ BACK BY AND VERIFIED WITH: JASON ROBBINS ON 08/24/16 AT 1933 QSD    Proteus species NOT DETECTED NOT DETECTED Final   Serratia marcescens NOT DETECTED NOT DETECTED Final   Carbapenem resistance NOT DETECTED NOT DETECTED Final   Haemophilus influenzae NOT DETECTED NOT DETECTED Final   Neisseria meningitidis NOT DETECTED NOT DETECTED Final   Pseudomonas aeruginosa NOT DETECTED NOT DETECTED Final   Candida albicans NOT DETECTED NOT DETECTED Final   Candida glabrata NOT DETECTED NOT DETECTED Final   Candida krusei NOT DETECTED NOT DETECTED Final   Candida parapsilosis NOT DETECTED NOT DETECTED Final   Candida tropicalis NOT DETECTED NOT DETECTED Final  Urine culture     Status: Abnormal   Collection Time: 08/24/16  9:19 AM  Result Value Ref Range Status   Specimen Description URINE, CATHETERIZED  Final   Special Requests NONE  Final   Culture >=100,000 COLONIES/mL KLEBSIELLA PNEUMONIAE (A)  Final   Report Status 08/26/2016 FINAL  Final   Organism ID, Bacteria KLEBSIELLA PNEUMONIAE (A)  Final      Susceptibility   Klebsiella pneumoniae - MIC*    AMPICILLIN >=32 RESISTANT Resistant     CEFAZOLIN <=4 SENSITIVE Sensitive     CEFTRIAXONE <=1 SENSITIVE Sensitive     CIPROFLOXACIN <=0.25 SENSITIVE Sensitive     GENTAMICIN <=1 SENSITIVE Sensitive     IMIPENEM <=0.25 SENSITIVE Sensitive      NITROFURANTOIN 64 INTERMEDIATE Intermediate     TRIMETH/SULFA <=20 SENSITIVE Sensitive     AMPICILLIN/SULBACTAM 4 SENSITIVE Sensitive     PIP/TAZO <=4 SENSITIVE Sensitive     Extended ESBL NEGATIVE Sensitive     * >=100,000 COLONIES/mL KLEBSIELLA PNEUMONIAE  Body fluid culture     Status: None   Collection Time: 08/24/16  3:30 PM  Result Value Ref Range Status   Specimen Description FLUID  Final   Special Requests LEFT NEPHROSTOMY  Final   Gram Stain   Final    WBC PRESENT, PREDOMINANTLY PMN GRAM NEGATIVE RODS CYTOSPIN SMEAR Performed at [facility], [address] [place], [place] [address]    Culture FEW KLEBSIELLA PNEUMONIAE  Final   Report Status 08/28/2016 FINAL  Final   Organism ID, Bacteria KLEBSIELLA PNEUMONIAE  Final      Susceptibility   Klebsiella pneumoniae - MIC*    AMPICILLIN >=32 RESISTANT Resistant     CEFAZOLIN <=4 SENSITIVE Sensitive     CEFEPIME <=1 SENSITIVE Sensitive     CEFTAZIDIME <=1 SENSITIVE Sensitive     CEFTRIAXONE <=1 SENSITIVE Sensitive     CIPROFLOXACIN <=0.25 SENSITIVE Sensitive     GENTAMICIN <=1 SENSITIVE Sensitive     IMIPENEM <=0.25 SENSITIVE Sensitive     TRIMETH/SULFA <=20 SENSITIVE Sensitive     AMPICILLIN/SULBACTAM 8 SENSITIVE Sensitive     PIP/TAZO <=4 SENSITIVE Sensitive     Extended ESBL NEGATIVE Sensitive     * FEW KLEBSIELLA PNEUMONIAE  MRSA PCR Screening     Status: None   Collection Time: 08/24/16  3:44 PM  Result Value Ref Range Status   MRSA by PCR NEGATIVE NEGATIVE Final    Comment:        The GeneXpert MRSA Assay (FDA approved for NASAL specimens only), is one component of a comprehensive MRSA colonization surveillance program. It is not intended to diagnose MRSA infection nor to guide or monitor treatment for MRSA infections.   Culture, respiratory (NON-Expectorated)     Status: None   Collection Time: 08/25/16  7:00 AM  Result Value Ref Range Status   Specimen Description TRACHEAL ASPIRATE  Final    Special Requests NONE  Final   Gram Stain   Final    FEW WBC PRESENT, PREDOMINANTLY PMN FEW SQUAMOUS EPITHELIAL CELLS PRESENT NO ORGANISMS SEEN    Culture   Final    Consistent with normal respiratory flora. Performed at [facility], [address] [place], [place] [address]    Report Status 08/27/2016 FINAL  Final  C difficile quick scan w PCR reflex     Status: None   Collection Time: 08/26/16  8:05 PM  Result Value Ref Range Status   C Diff antigen NEGATIVE NEGATIVE Final   C Diff toxin NEGATIVE NEGATIVE Final   C Diff interpretation No C. difficile detected.  Final  Culture, blood (Routine X 2) w Reflex to ID Panel     Status: None (Preliminary result)   Collection Time: 08/30/16  9:03 AM  Result Value Ref Range Status   Specimen Description BLOOD LEFT ARM  Final   Special Requests   Final    BOTTLES DRAWN AEROBIC AND ANAEROBIC Blood Culture adequate volume   Culture NO GROWTH 3 DAYS  Final   Report Status PENDING  Incomplete  Culture, blood (Routine X 2) w Reflex to ID Panel     Status: None (Preliminary result)   Collection Time: 08/30/16 10:18 AM  Result Value Ref Range Status   Specimen Description BLOOD LEFT ARM  Final   Special Requests   Final    BOTTLES DRAWN AEROBIC AND ANAEROBIC Blood Culture adequate volume   Culture NO GROWTH 3 DAYS  Final   Report Status PENDING  Incomplete  Urine Culture     Status: None   Collection Time: 08/30/16 12:01 PM  Result Value Ref Range Status   Specimen Description URINE, RANDOM  Final   Special Requests NONE  Final   Culture   Final    NO GROWTH Performed at [facility], [address], [place], [place] [address]    Report Status 08/31/2016 FINAL  Final    Coagulation Studies:  Recent Labs  08/31/16 0753 08/31/16 2144 09/01/16 0606  LABPROT 16.5* 16.3* 16.8*  INR 1.32 1.30 1.35    Urinalysis: No results for input(s): COLORURINE, LABSPEC, PHURINE, GLUCOSEU, HGBUR, BILIRUBINUR, KETONESUR, PROTEINUR,  UROBILINOGEN, NITRITE, LEUKOCYTESUR in the last 72 hours.  Invalid input(s): APPERANCEUR    Imaging: No results found.   Medications:   .  ceFAZolin (ANCEF) IV     . citalopram  20 mg Oral Daily  . feeding supplement (ENSURE ENLIVE)  237 mL Oral TID BM  . insulin aspart  0-20 Units Subcutaneous Q4H  . insulin glargine  20 Units Subcutaneous Daily  . mouth rinse  15 mL Mouth Rinse q12n4p  . pantoprazole  40 mg Oral Daily  . sodium chloride flush  10-40 mL Intracatheter Q12H   acetaminophen **OR** [DISCONTINUED] acetaminophen, albuterol, bisacodyl, hydrALAZINE, ondansetron **OR** ondansetron (ZOFRAN) IV, senna-docusate, sodium chloride  flush  Assessment/ Plan:  Ms. Megan Nixon is a 66 y.o. black female with diabetes mellitus type II, hypertension, history of CVA, who was admitted to [facility] on 08/24/2016 for septic shock  1. Acute renal failure with metabolic acidosis: baseline creatinine of 0.7 on 06/12/2016.  Acute renal failure secondary obstructive uropathy, sepsis, hypotension and ATN.  Left  hydrohephrosis- Percutaneous nephrostomy placed by VIR on 08/24/16  UOP > 1000 cc ow - No acute indication for dialysis today.   - electrolytes and volume status are acceptable  2. Septic Shock: Klebsiella.  Afebrile. Leukocytosis    Klebsiella in urine  antibiotics as per primary team  3. Cirrhosis and anasarca noted on CT ? Etiology - now that blood pressure has improved, we'll consider starting Lasix tomorrow morning  Will follow    LOS: 9 Megan Nixon 7/10/20184:37 PM

## 2016-09-02 NOTE — Progress Notes (Signed)
Inpatient Rehabilitation  Note that OT evaluation orders have been placed.  I have spoken with [name] and plan on meeting with him and his wife tomorrow morning between 8-8:30 in her room to discuss the [facility].  I have updated Marylene LandAngela RN CM.  Please call with questions.   Charlane FerrettiMelissa Anne Sebring, M.A., CCC/SLP Admission Coordinator  [facility]  Cell [phone]

## 2016-09-02 NOTE — Progress Notes (Addendum)
Pt was able to stand and pivot to the chair this morning.  She sat on the edge of the bed and tried to stand once but her legs buckled and she sat back on the bed.  Pt tried once again and was able to stand and pivot to chair.  Myself and two nurse techs assisted in the transfer. Pt was able to stay in the chair for three hours. Pt now back to bed with the same three assists.

## 2016-09-02 NOTE — Progress Notes (Signed)
[facility] CLINIC INFECTIOUS DISEASE PROGRESS NOTE Date of Admission:  08/24/2016     ID: Megan Nixon is a 66 y.o. female with  Klebsiella urosepsis Active Problems:   Septic shock (HCC)   Sepsis (HCC)   Acute renal failure (HCC)   Kidney stone on left side   Subjective: No fevers, wbc trending down  ROS  Eleven systems are reviewed and negative except per hpi  Medications:  Antibiotics Given (last 72 hours)    Date/Time Action Medication Dose Rate   08/30/16 2231 New Bag/Given   meropenem (MERREM) 1 g in sodium chloride 0.9 % 100 mL IVPB 1 g 200 mL/hr   08/31/16 1011 New Bag/Given   meropenem (MERREM) 1 g in sodium chloride 0.9 % 100 mL IVPB 1 g 200 mL/hr   08/31/16 1011 New Bag/Given   vancomycin (VANCOCIN) IVPB 1000 mg/200 mL premix 1,000 mg 200 mL/hr   08/31/16 2334 New Bag/Given   meropenem (MERREM) 1 g in sodium chloride 0.9 % 100 mL IVPB 1 g 200 mL/hr   09/01/16 0903 New Bag/Given   meropenem (MERREM) 1 g in sodium chloride 0.9 % 100 mL IVPB 1 g 200 mL/hr   09/01/16 1709 New Bag/Given   ceFAZolin (ANCEF) IVPB 2g/100 mL premix 2 g 200 mL/hr     . citalopram  20 mg Oral Daily  . feeding supplement (ENSURE ENLIVE)  237 mL Oral TID BM  . insulin aspart  0-20 Units Subcutaneous Q4H  . insulin glargine  20 Units Subcutaneous Daily  . mouth rinse  15 mL Mouth Rinse q12n4p  . pantoprazole  40 mg Oral Daily  . sodium chloride flush  10-40 mL Intracatheter Q12H    Objective: Vital signs in last 24 hours: Temp:  [97.6 F (36.4 C)-98.3 F (36.8 C)] 97.6 F (36.4 C) (07/10 0818) Pulse Rate:  [85-104] 104 (07/10 0818) Resp:  [18-39] 21 (07/10 0818) BP: (123-149)/(74-92) 145/80 (07/10 0818) SpO2:  [91 %-100 %] 99 % (07/10 0818) Weight:  [95.9 kg (211 lb 6.7 oz)] 95.9 kg (211 lb 6.7 oz) (07/10 0400) Constitutional:  Awake, family at bedside, slowed mentation  HENT: [place]/AT, PERRLA, no scleral icterus Mouth/Throat: Oropharynx is clear and moist. No oropharyngeal exudate.   Cardiovascular: Normal rate, regular rhythm and normal heart sounds. Pulmonary/Chest: Effort normal and breath sounds normal. No respiratory distress.  has no wheezes.  Neck = supple, no nuchal rigidity Abdominal: Soft. Bowel sounds are normal.  exhibits no distension. There is no tenderness.  Lymphadenopathy: no cervical adenopathy. No axillary adenopathy Neurological: alert and oriented to person, place, and time.  Skin:bil hands with areas of pink and purplish discoloration,  bil feet with areas of bluish discoloration, gangrenous changes of several toes. Blister on dorsum of several toes Psychiatric: a normal mood and affect.  behavior is normal.   Lab Results  Recent Labs  09/01/16 0606 09/02/16 0322 09/02/16 0323  WBC 41.1*  --  34.8*  HGB 8.8*  --  8.2*  HCT 26.1*  --  25.6*  NA 138 141  --   K 3.8 4.1  --   CL 105 107  --   CO2 24 25  --   BUN 76* 93*  --   CREATININE 3.56* 4.15*  --     Microbiology: Results for orders placed or performed during the hospital encounter of 08/24/16  Blood Culture (routine x 2)     Status: Abnormal   Collection Time: 08/24/16  8:25 AM  Result Value  Ref Range Status   Specimen Description BLOOD RIGHT ANTECUBITAL  Final   Special Requests   Final    BOTTLES DRAWN AEROBIC AND ANAEROBIC Blood Culture adequate volume   Culture  Setup Time   Final    GRAM NEGATIVE RODS IN BOTH AEROBIC AND ANAEROBIC BOTTLES CRITICAL RESULT CALLED TO, READ BACK BY AND VERIFIED WITH: JASON ROBBINS ON 08/24/16 AT 1933 QSD    Culture (A)  Final    KLEBSIELLA PNEUMONIAE SUSCEPTIBILITIES PERFORMED ON PREVIOUS CULTURE WITHIN THE LAST 5 DAYS. Performed at [facility], [address] [place], [place] [address]    Report Status 08/27/2016 FINAL  Final  Blood Culture (routine x 2)     Status: Abnormal   Collection Time: 08/24/16  8:25 AM  Result Value Ref Range Status   Specimen Description BLOOD RIGHT ANTECUBITAL  Final   Special Requests   Final     BOTTLES DRAWN AEROBIC AND ANAEROBIC Blood Culture adequate volume   Culture  Setup Time   Final    GRAM NEGATIVE RODS IN BOTH AEROBIC AND ANAEROBIC BOTTLES CRITICAL RESULT CALLED TO, READ BACK BY AND VERIFIED WITH: JASON ROBBINS ON 08/24/16 AT 1933 QSD    Culture KLEBSIELLA PNEUMONIAE (A)  Final   Report Status 08/27/2016 FINAL  Final   Organism ID, Bacteria KLEBSIELLA PNEUMONIAE  Final      Susceptibility   Klebsiella pneumoniae - MIC*    AMPICILLIN >=32 RESISTANT Resistant     CEFAZOLIN <=4 SENSITIVE Sensitive     CEFEPIME <=1 SENSITIVE Sensitive     CEFTAZIDIME <=1 SENSITIVE Sensitive     CEFTRIAXONE <=1 SENSITIVE Sensitive     CIPROFLOXACIN <=0.25 SENSITIVE Sensitive     GENTAMICIN <=1 SENSITIVE Sensitive     IMIPENEM <=0.25 SENSITIVE Sensitive     TRIMETH/SULFA <=20 SENSITIVE Sensitive     AMPICILLIN/SULBACTAM 8 SENSITIVE Sensitive     PIP/TAZO <=4 SENSITIVE Sensitive     Extended ESBL NEGATIVE Sensitive     * KLEBSIELLA PNEUMONIAE  Blood Culture ID Panel (Reflexed)     Status: Abnormal   Collection Time: 08/24/16  8:25 AM  Result Value Ref Range Status   Enterococcus species NOT DETECTED NOT DETECTED Final   Listeria monocytogenes NOT DETECTED NOT DETECTED Final   Staphylococcus species NOT DETECTED NOT DETECTED Final   Staphylococcus aureus NOT DETECTED NOT DETECTED Final   Streptococcus species NOT DETECTED NOT DETECTED Final   Streptococcus agalactiae NOT DETECTED NOT DETECTED Final   Streptococcus pneumoniae NOT DETECTED NOT DETECTED Final   Streptococcus pyogenes NOT DETECTED NOT DETECTED Final   Acinetobacter baumannii NOT DETECTED NOT DETECTED Final   Enterobacteriaceae species DETECTED (A) NOT DETECTED Final    Comment: Enterobacteriaceae represent a large family of gram-negative bacteria, not a single organism. CRITICAL RESULT CALLED TO, READ BACK BY AND VERIFIED WITH: JASON ROBBINS ON 08/24/16 AT 1933 QSD    Enterobacter cloacae complex NOT DETECTED NOT DETECTED  Final   Escherichia coli NOT DETECTED NOT DETECTED Final   Klebsiella oxytoca NOT DETECTED NOT DETECTED Final   Klebsiella pneumoniae DETECTED (A) NOT DETECTED Final    Comment: CRITICAL RESULT CALLED TO, READ BACK BY AND VERIFIED WITH: JASON ROBBINS ON 08/24/16 AT 1933 QSD    Proteus species NOT DETECTED NOT DETECTED Final   Serratia marcescens NOT DETECTED NOT DETECTED Final   Carbapenem resistance NOT DETECTED NOT DETECTED Final   Haemophilus influenzae NOT DETECTED NOT DETECTED Final   Neisseria meningitidis NOT DETECTED NOT DETECTED  Final   Pseudomonas aeruginosa NOT DETECTED NOT DETECTED Final   Candida albicans NOT DETECTED NOT DETECTED Final   Candida glabrata NOT DETECTED NOT DETECTED Final   Candida krusei NOT DETECTED NOT DETECTED Final   Candida parapsilosis NOT DETECTED NOT DETECTED Final   Candida tropicalis NOT DETECTED NOT DETECTED Final  Urine culture     Status: Abnormal   Collection Time: 08/24/16  9:19 AM  Result Value Ref Range Status   Specimen Description URINE, CATHETERIZED  Final   Special Requests NONE  Final   Culture >=100,000 COLONIES/mL KLEBSIELLA PNEUMONIAE (A)  Final   Report Status 08/26/2016 FINAL  Final   Organism ID, Bacteria KLEBSIELLA PNEUMONIAE (A)  Final      Susceptibility   Klebsiella pneumoniae - MIC*    AMPICILLIN >=32 RESISTANT Resistant     CEFAZOLIN <=4 SENSITIVE Sensitive     CEFTRIAXONE <=1 SENSITIVE Sensitive     CIPROFLOXACIN <=0.25 SENSITIVE Sensitive     GENTAMICIN <=1 SENSITIVE Sensitive     IMIPENEM <=0.25 SENSITIVE Sensitive     NITROFURANTOIN 64 INTERMEDIATE Intermediate     TRIMETH/SULFA <=20 SENSITIVE Sensitive     AMPICILLIN/SULBACTAM 4 SENSITIVE Sensitive     PIP/TAZO <=4 SENSITIVE Sensitive     Extended ESBL NEGATIVE Sensitive     * >=100,000 COLONIES/mL KLEBSIELLA PNEUMONIAE  Body fluid culture     Status: None   Collection Time: 08/24/16  3:30 PM  Result Value Ref Range Status   Specimen Description FLUID  Final    Special Requests LEFT NEPHROSTOMY  Final   Gram Stain   Final    WBC PRESENT, PREDOMINANTLY PMN GRAM NEGATIVE RODS CYTOSPIN SMEAR Performed at [facility], [address] [place], [place] [id number]    Culture FEW KLEBSIELLA PNEUMONIAE  Final   Report Status 08/28/2016 FINAL  Final   Organism ID, Bacteria KLEBSIELLA PNEUMONIAE  Final      Susceptibility   Klebsiella pneumoniae - MIC*    AMPICILLIN >=32 RESISTANT Resistant     CEFAZOLIN <=4 SENSITIVE Sensitive     CEFEPIME <=1 SENSITIVE Sensitive     CEFTAZIDIME <=1 SENSITIVE Sensitive     CEFTRIAXONE <=1 SENSITIVE Sensitive     CIPROFLOXACIN <=0.25 SENSITIVE Sensitive     GENTAMICIN <=1 SENSITIVE Sensitive     IMIPENEM <=0.25 SENSITIVE Sensitive     TRIMETH/SULFA <=20 SENSITIVE Sensitive     AMPICILLIN/SULBACTAM 8 SENSITIVE Sensitive     PIP/TAZO <=4 SENSITIVE Sensitive     Extended ESBL NEGATIVE Sensitive     * FEW KLEBSIELLA PNEUMONIAE  MRSA PCR Screening     Status: None   Collection Time: 08/24/16  3:44 PM  Result Value Ref Range Status   MRSA by PCR NEGATIVE NEGATIVE Final    Comment:        The GeneXpert MRSA Assay (FDA approved for NASAL specimens only), is one component of a comprehensive MRSA colonization surveillance program. It is not intended to diagnose MRSA infection nor to guide or monitor treatment for MRSA infections.   Culture, respiratory (NON-Expectorated)     Status: None   Collection Time: 08/25/16  7:00 AM  Result Value Ref Range Status   Specimen Description TRACHEAL ASPIRATE  Final   Special Requests NONE  Final   Gram Stain   Final    FEW WBC PRESENT, PREDOMINANTLY PMN FEW SQUAMOUS EPITHELIAL CELLS PRESENT NO ORGANISMS SEEN    Culture   Final    Consistent with normal respiratory flora.  Performed at [facility], [address], [place], [place] [address]    Report Status 08/27/2016 FINAL  Final  C difficile quick scan w PCR reflex     Status: None   Collection Time:  08/26/16  8:05 PM  Result Value Ref Range Status   C Diff antigen NEGATIVE NEGATIVE Final   C Diff toxin NEGATIVE NEGATIVE Final   C Diff interpretation No C. difficile detected.  Final  Culture, blood (Routine X 2) w Reflex to ID Panel     Status: None (Preliminary result)   Collection Time: 08/30/16  9:03 AM  Result Value Ref Range Status   Specimen Description BLOOD LEFT ARM  Final   Special Requests   Final    BOTTLES DRAWN AEROBIC AND ANAEROBIC Blood Culture adequate volume   Culture NO GROWTH 3 DAYS  Final   Report Status PENDING  Incomplete  Culture, blood (Routine X 2) w Reflex to ID Panel     Status: None (Preliminary result)   Collection Time: 08/30/16 10:18 AM  Result Value Ref Range Status   Specimen Description BLOOD LEFT ARM  Final   Special Requests   Final    BOTTLES DRAWN AEROBIC AND ANAEROBIC Blood Culture adequate volume   Culture NO GROWTH 3 DAYS  Final   Report Status PENDING  Incomplete  Urine Culture     Status: None   Collection Time: 08/30/16 12:01 PM  Result Value Ref Range Status   Specimen Description URINE, RANDOM  Final   Special Requests NONE  Final   Culture   Final    NO GROWTH Performed at [facility], [address], [place], [place] [address]    Report Status 08/31/2016 FINAL  Final    Studies/Results: No results found.  Assessment/Plan: Megan Nixon is a 66 y.o. female admitted with urosepsis from L obstructing nephrolithiasis with ucx, bcx + Klebsiella, now s/p L nephrostomy, improving following period of intubation, pressor requirement and CRRT. She has had removal of all central lines, rectal tube but remains with Elevated wbc up to 51, now trending down. She has gangrenous changes of bil feet and hands. Has seen vascular. Fu bcx and ucx negative.  I suspect her leukocytosis which is out of proportion to her clinical pictures is related to her gangrenous changes. She had recent repeat CT with no evidence of abscess. Has had  nephrostomy tube in place.  Day 10 of abx Recommendations Continue ancef while inpatient but can change to oral keflex when stable - would rec 14 day course Monitor wbc Monitor LE and hands for progression to gangrene. Thank you very much for the consult. Will follow with you.  Ymani Porcher P   09/02/2016, 2:19 PM

## 2016-09-02 NOTE — Care Management (Addendum)
Please follow CIR notes. Megan PippinMelissa Nixon with CIR requested OT evaluation on 09/01/16- entered per Dr. Belia HemanKasa today which was cancelled by this RNCM due to duplication. Dr. Allena KatzPatel authorized OT consult also on today's date.

## 2016-09-03 ENCOUNTER — Inpatient Hospital Stay: Payer: Medicare PPO

## 2016-09-03 LAB — RENAL FUNCTION PANEL
ALBUMIN: 2.4 g/dL — AB (ref 3.5–5.0)
Anion gap: 10 (ref 5–15)
BUN: 90 mg/dL — AB (ref 6–20)
CALCIUM: 8.2 mg/dL — AB (ref 8.9–10.3)
CO2: 24 mmol/L (ref 22–32)
CREATININE: 4.3 mg/dL — AB (ref 0.44–1.00)
Chloride: 106 mmol/L (ref 101–111)
GFR calc Af Amer: 11 mL/min — ABNORMAL LOW (ref >60)
GFR calc non Af Amer: 10 mL/min — ABNORMAL LOW (ref >60)
GLUCOSE: 133 mg/dL — AB (ref 65–99)
Phosphorus: 7.2 mg/dL — ABNORMAL HIGH (ref 2.5–4.6)
Potassium: 4.2 mmol/L (ref 3.5–5.1)
SODIUM: 140 mmol/L (ref 135–145)

## 2016-09-03 LAB — GLUCOSE, CAPILLARY
GLUCOSE-CAPILLARY: 103 mg/dL — AB (ref 65–99)
GLUCOSE-CAPILLARY: 78 mg/dL (ref 65–99)
Glucose-Capillary: 112 mg/dL — ABNORMAL HIGH (ref 65–99)
Glucose-Capillary: 131 mg/dL — ABNORMAL HIGH (ref 65–99)
Glucose-Capillary: 78 mg/dL (ref 65–99)
Glucose-Capillary: 91 mg/dL (ref 65–99)

## 2016-09-03 MED ORDER — NEPRO/CARBSTEADY PO LIQD
237.0000 mL | Freq: Three times a day (TID) | ORAL | Status: DC
  Administered 2016-09-03: 237 mL via ORAL

## 2016-09-03 MED ORDER — SODIUM CHLORIDE 0.9% FLUSH: 3.0000 mL | INTRAVENOUS | Status: DC | PRN

## 2016-09-03 MED ORDER — HEPARIN SODIUM (PORCINE) 5000 UNIT/ML IJ SOLN
5000.0000 [IU] | Freq: Three times a day (TID) | INTRAMUSCULAR | Status: DC
  Administered 2016-09-03 – 2016-09-05 (×7): 5000 [IU] via SUBCUTANEOUS
  Filled 2016-09-03 (×7): qty 1

## 2016-09-03 MED ORDER — SODIUM CHLORIDE 0.9% FLUSH
3.0000 mL | Freq: Two times a day (BID) | INTRAVENOUS | Status: DC
  Administered 2016-09-03 – 2016-09-05 (×5): 3 mL via INTRAVENOUS

## 2016-09-03 NOTE — Progress Notes (Addendum)
Nutrition Follow-up  DOCUMENTATION CODES:   Obesity unspecified  INTERVENTION:   -D/c Ensure Enlive po TID, each supplement provides 350 kcal and 20 grams of protein -Nepro Shake po TID, each supplement provides 425 kcal and 19 grams protein  NUTRITION DIAGNOSIS:   Inadequate oral intake related to poor appetite as evidenced by per patient/family report.  Ongoing  GOAL:   Patient will meet greater than or equal to 90% of their needs  Progressing  MONITOR:   PO intake, Supplement acceptance, Labs, Weight trends, I & O's  REASON FOR ASSESSMENT:   Consult Assessment of nutrition requirement/status (and Poor PO)  ASSESSMENT:   66 y.o. black female with diabetes mellitus type II, hypertension, history of CVA, who was admitted to [facility] on 08/24/2016 for hydronephrosis, acute renal failure with metabolic acidosis and septic shock secondary to left kidney stone. Pt intubated r/t acute respiratory failure and now s/p nephrostomy tube placement 7/1.   -Off all vasopressors on 7/5.  -Pt s/p extubation on 7/6. Tube feeds were stopped and OGT was removed. -CVVHD was discontinued on 7/7. May transition to intermittent HD if renal function does not improve. Dialysis catheter being removed today (7/8) d/t leukocytosis.  -Pt has had mottling of both great toes, left third toe, right thumb, and patchy areas of right palm. 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 May require amputation of toes, but no indication for emergent operation at this time.  -Following SLP evaluation on 7/7 pt was able to be advanced to regular texture diet with thin liquids. Was placed on renal/carbohydrate modified diet. -7/9- rectal tube removed -7/10- transferred from ICU/SDU to medical floor  Per nephrology notes, UOP > 1000 cc with no acute indication for dialysis today.    Spoke with pt, who had flat affect, but answered questions. She reports fair appetite.  Husband also at bedside, who reports that appetite is still poor and pt's food selections are limited ("she's pretty much been eating fruit; if it has fruit in it, she will eat it"). Noted pt consumed 50-100% of meals over the past 24 hours (consuming approximately 1103 kcals and 39 grams protein, meeting 59% of estimated kcal needs and 38% of estimated protein needs). Per MAR, pt has been refusing Ensure supplements. Pt shares that she stopped consuming these about two days ago due to indigestion. Pt husband has been encouraging her to participate in therapy and to eat.   Discussed with pt and husband importance of adequate nutrition to support healing and therapy progression. Discussed other options to increase nutritional intake; pt was at first reluctant to continue supplements, however, agreeable to try again after explained importance of doing so. RD will substitute with Nepro, due to increased caloric density and risking K, BUN/Creat levels.   Per chart review, pt medically stable for discharge. Awaiting on insurance approval for CIR vs SNF.   Labs reviewed: CBGS: 91-167, Phos: 7.2, BUN/Creat: 90/4.3 (trending up).   Diet Order:  Diet renal/carb modified with fluid restriction Diet-HS Snack? Nothing; Room service appropriate? Yes with Assist; Fluid consistency: Thin  Skin:  Wound (see comment) (cyanosis to toes, right thumb)  Last BM:  09/02/16  Height:   Ht Readings from Last 1 Encounters:  08/24/16 5\' 8"  (1.727 m)    Weight:   Wt Readings from Last 1 Encounters:  09/03/16 210 lb 9.6 oz (95.5 kg)    Ideal Body Weight:  63.6 kg  BMI:  Body mass  index is 32.02 kg/m.  Estimated Nutritional Needs:   Kcal:  1880-2170 (MSJ x 1.3-1.5)  Protein:  103-129 grams (1.2-1.5 grams/kg)  Fluid:  UOP + 1 L  EDUCATION NEEDS:   Education needs addressed  Kelee Cunningham A. Mayford KnifeWilliams, RD, LDN, CDE Pager: [phone] After hours Pager: [phone]

## 2016-09-03 NOTE — Progress Notes (Signed)
[facility] CLINIC INFECTIOUS DISEASE PROGRESS NOTE Date of Admission:  08/24/2016     ID: Megan Nixon is a 66 y.o. female with  Klebsiella urosepsis Active Problems:   Septic shock (HCC)   Sepsis (HCC)   Acute renal failure (HCC)   Kidney stone on left side   Subjective: Out of unit, no fevers. Hands improving  ROS  Eleven systems are reviewed and negative except per hpi  Medications:  Antibiotics Given (last 72 hours)    Date/Time Action Medication Dose Rate   08/31/16 2334 New Bag/Given   meropenem (MERREM) 1 g in sodium chloride 0.9 % 100 mL IVPB 1 g 200 mL/hr   09/01/16 0903 New Bag/Given   meropenem (MERREM) 1 g in sodium chloride 0.9 % 100 mL IVPB 1 g 200 mL/hr   09/01/16 1709 New Bag/Given   ceFAZolin (ANCEF) IVPB 2g/100 mL premix 2 g 200 mL/hr   09/02/16 1653 New Bag/Given   ceFAZolin (ANCEF) IVPB 1 g/50 mL premix 1 g 100 mL/hr   09/03/16 1020 New Bag/Given   ceFAZolin (ANCEF) IVPB 1 g/50 mL premix 1 g 100 mL/hr     . citalopram  20 mg Oral Daily  . feeding supplement (NEPRO CARB STEADY)  237 mL Oral TID BM  . heparin subcutaneous  5,000 Units Subcutaneous Q8H  . insulin aspart  0-20 Units Subcutaneous Q4H  . insulin glargine  20 Units Subcutaneous Daily  . mouth rinse  15 mL Mouth Rinse q12n4p  . pantoprazole  40 mg Oral Daily  . sodium chloride flush  3 mL Intravenous Q12H    Objective: Vital signs in last 24 hours: Temp:  [98.1 F (36.7 C)-98.7 F (37.1 C)] 98.3 F (36.8 C) (07/11 1424) Pulse Rate:  [83-93] 83 (07/11 1424) Resp:  [17-18] 18 (07/11 1424) BP: (136-153)/(72-106) 141/72 (07/11 1424) SpO2:  [92 %-99 %] 92 % (07/11 1424) Weight:  [95.5 kg (210 lb 9.6 oz)] 95.5 kg (210 lb 9.6 oz) (07/11 0335) Constitutional:  Awake, family at bedside, slowed mentation  HENT: [place]/AT, PERRLA, no scleral icterus Mouth/Throat: Oropharynx is clear and moist. No oropharyngeal exudate.  Cardiovascular: Normal rate, regular rhythm and normal heart  sounds. Pulmonary/Chest: Effort normal and breath sounds normal. No respiratory distress.  has no wheezes.  Neck = supple, no nuchal rigidity Abdominal: Soft. Bowel sounds are normal.  exhibits no distension. There is no tenderness.  Lymphadenopathy: no cervical adenopathy. No axillary adenopathy Neurological: alert and oriented to person, place, and time.  Skin:bil hands with areas of pink and purplish discoloration,  bil feet with areas of bluish discoloration, gangrenous changes of several toes. Blister on dorsum of several toes Psychiatric: a normal mood and affect.  behavior is normal.   Lab Results  Recent Labs  09/01/16 0606 09/02/16 0322 09/02/16 0323 09/03/16 0845  WBC 41.1*  --  34.8*  --   HGB 8.8*  --  8.2*  --   HCT 26.1*  --  25.6*  --   NA 138 141  --  140  K 3.8 4.1  --  4.2  CL 105 107  --  106  CO2 24 25  --  24  BUN 76* 93*  --  90*  CREATININE 3.56* 4.15*  --  4.30*    Microbiology: Results for orders placed or performed during the hospital encounter of 08/24/16  Blood Culture (routine x 2)     Status: Abnormal   Collection Time: 08/24/16  8:25 AM  Result Value  Ref Range Status   Specimen Description BLOOD RIGHT ANTECUBITAL  Final   Special Requests   Final    BOTTLES DRAWN AEROBIC AND ANAEROBIC Blood Culture adequate volume   Culture  Setup Time   Final    GRAM NEGATIVE RODS IN BOTH AEROBIC AND ANAEROBIC BOTTLES CRITICAL RESULT CALLED TO, READ BACK BY AND VERIFIED WITH: JASON ROBBINS ON 08/24/16 AT 1933 QSD    Culture (A)  Final    KLEBSIELLA PNEUMONIAE SUSCEPTIBILITIES PERFORMED ON PREVIOUS CULTURE WITHIN THE LAST 5 DAYS. Performed at [facility], [address] [place], [place] [address]    Report Status 08/27/2016 FINAL  Final  Blood Culture (routine x 2)     Status: Abnormal   Collection Time: 08/24/16  8:25 AM  Result Value Ref Range Status   Specimen Description BLOOD RIGHT ANTECUBITAL  Final   Special Requests   Final    BOTTLES  DRAWN AEROBIC AND ANAEROBIC Blood Culture adequate volume   Culture  Setup Time   Final    GRAM NEGATIVE RODS IN BOTH AEROBIC AND ANAEROBIC BOTTLES CRITICAL RESULT CALLED TO, READ BACK BY AND VERIFIED WITH: JASON ROBBINS ON 08/24/16 AT 1933 QSD    Culture KLEBSIELLA PNEUMONIAE (A)  Final   Report Status 08/27/2016 FINAL  Final   Organism ID, Bacteria KLEBSIELLA PNEUMONIAE  Final      Susceptibility   Klebsiella pneumoniae - MIC*    AMPICILLIN >=32 RESISTANT Resistant     CEFAZOLIN <=4 SENSITIVE Sensitive     CEFEPIME <=1 SENSITIVE Sensitive     CEFTAZIDIME <=1 SENSITIVE Sensitive     CEFTRIAXONE <=1 SENSITIVE Sensitive     CIPROFLOXACIN <=0.25 SENSITIVE Sensitive     GENTAMICIN <=1 SENSITIVE Sensitive     IMIPENEM <=0.25 SENSITIVE Sensitive     TRIMETH/SULFA <=20 SENSITIVE Sensitive     AMPICILLIN/SULBACTAM 8 SENSITIVE Sensitive     PIP/TAZO <=4 SENSITIVE Sensitive     Extended ESBL NEGATIVE Sensitive     * KLEBSIELLA PNEUMONIAE  Blood Culture ID Panel (Reflexed)     Status: Abnormal   Collection Time: 08/24/16  8:25 AM  Result Value Ref Range Status   Enterococcus species NOT DETECTED NOT DETECTED Final   Listeria monocytogenes NOT DETECTED NOT DETECTED Final   Staphylococcus species NOT DETECTED NOT DETECTED Final   Staphylococcus aureus NOT DETECTED NOT DETECTED Final   Streptococcus species NOT DETECTED NOT DETECTED Final   Streptococcus agalactiae NOT DETECTED NOT DETECTED Final   Streptococcus pneumoniae NOT DETECTED NOT DETECTED Final   Streptococcus pyogenes NOT DETECTED NOT DETECTED Final   Acinetobacter baumannii NOT DETECTED NOT DETECTED Final   Enterobacteriaceae species DETECTED (A) NOT DETECTED Final    Comment: Enterobacteriaceae represent a large family of gram-negative bacteria, not a single organism. CRITICAL RESULT CALLED TO, READ BACK BY AND VERIFIED WITH: JASON ROBBINS ON 08/24/16 AT 1933 QSD    Enterobacter cloacae complex NOT DETECTED NOT DETECTED Final    Escherichia coli NOT DETECTED NOT DETECTED Final   Klebsiella oxytoca NOT DETECTED NOT DETECTED Final   Klebsiella pneumoniae DETECTED (A) NOT DETECTED Final    Comment: CRITICAL RESULT CALLED TO, READ BACK BY AND VERIFIED WITH: JASON ROBBINS ON 08/24/16 AT 1933 QSD    Proteus species NOT DETECTED NOT DETECTED Final   Serratia marcescens NOT DETECTED NOT DETECTED Final   Carbapenem resistance NOT DETECTED NOT DETECTED Final   Haemophilus influenzae NOT DETECTED NOT DETECTED Final   Neisseria meningitidis NOT DETECTED NOT DETECTED  Final   Pseudomonas aeruginosa NOT DETECTED NOT DETECTED Final   Candida albicans NOT DETECTED NOT DETECTED Final   Candida glabrata NOT DETECTED NOT DETECTED Final   Candida krusei NOT DETECTED NOT DETECTED Final   Candida parapsilosis NOT DETECTED NOT DETECTED Final   Candida tropicalis NOT DETECTED NOT DETECTED Final  Urine culture     Status: Abnormal   Collection Time: 08/24/16  9:19 AM  Result Value Ref Range Status   Specimen Description URINE, CATHETERIZED  Final   Special Requests NONE  Final   Culture >=100,000 COLONIES/mL KLEBSIELLA PNEUMONIAE (A)  Final   Report Status 08/26/2016 FINAL  Final   Organism ID, Bacteria KLEBSIELLA PNEUMONIAE (A)  Final      Susceptibility   Klebsiella pneumoniae - MIC*    AMPICILLIN >=32 RESISTANT Resistant     CEFAZOLIN <=4 SENSITIVE Sensitive     CEFTRIAXONE <=1 SENSITIVE Sensitive     CIPROFLOXACIN <=0.25 SENSITIVE Sensitive     GENTAMICIN <=1 SENSITIVE Sensitive     IMIPENEM <=0.25 SENSITIVE Sensitive     NITROFURANTOIN 64 INTERMEDIATE Intermediate     TRIMETH/SULFA <=20 SENSITIVE Sensitive     AMPICILLIN/SULBACTAM 4 SENSITIVE Sensitive     PIP/TAZO <=4 SENSITIVE Sensitive     Extended ESBL NEGATIVE Sensitive     * >=100,000 COLONIES/mL KLEBSIELLA PNEUMONIAE  Body fluid culture     Status: None   Collection Time: 08/24/16  3:30 PM  Result Value Ref Range Status   Specimen Description FLUID  Final    Special Requests LEFT NEPHROSTOMY  Final   Gram Stain   Final    WBC PRESENT, PREDOMINANTLY PMN GRAM NEGATIVE RODS CYTOSPIN SMEAR Performed at [facility], [address], [place], [place] [address]    Culture FEW KLEBSIELLA PNEUMONIAE  Final   Report Status 08/28/2016 FINAL  Final   Organism ID, Bacteria KLEBSIELLA PNEUMONIAE  Final      Susceptibility   Klebsiella pneumoniae - MIC*    AMPICILLIN >=32 RESISTANT Resistant     CEFAZOLIN <=4 SENSITIVE Sensitive     CEFEPIME <=1 SENSITIVE Sensitive     CEFTAZIDIME <=1 SENSITIVE Sensitive     CEFTRIAXONE <=1 SENSITIVE Sensitive     CIPROFLOXACIN <=0.25 SENSITIVE Sensitive     GENTAMICIN <=1 SENSITIVE Sensitive     IMIPENEM <=0.25 SENSITIVE Sensitive     TRIMETH/SULFA <=20 SENSITIVE Sensitive     AMPICILLIN/SULBACTAM 8 SENSITIVE Sensitive     PIP/TAZO <=4 SENSITIVE Sensitive     Extended ESBL NEGATIVE Sensitive     * FEW KLEBSIELLA PNEUMONIAE  MRSA PCR Screening     Status: None   Collection Time: 08/24/16  3:44 PM  Result Value Ref Range Status   MRSA by PCR NEGATIVE NEGATIVE Final    Comment:        The GeneXpert MRSA Assay (FDA approved for NASAL specimens only), is one component of a comprehensive MRSA colonization surveillance program. It is not intended to diagnose MRSA infection nor to guide or monitor treatment for MRSA infections.   Culture, respiratory (NON-Expectorated)     Status: None   Collection Time: 08/25/16  7:00 AM  Result Value Ref Range Status   Specimen Description TRACHEAL ASPIRATE  Final   Special Requests NONE  Final   Gram Stain   Final    FEW WBC PRESENT, PREDOMINANTLY PMN FEW SQUAMOUS EPITHELIAL CELLS PRESENT NO ORGANISMS SEEN    Culture   Final    Consistent with normal respiratory flora.  Performed at [facility], [address] [place], [place] [address]    Report Status 08/27/2016 FINAL  Final  C difficile quick scan w PCR reflex     Status: None   Collection Time:  08/26/16  8:05 PM  Result Value Ref Range Status   C Diff antigen NEGATIVE NEGATIVE Final   C Diff toxin NEGATIVE NEGATIVE Final   C Diff interpretation No C. difficile detected.  Final  Culture, blood (Routine X 2) w Reflex to ID Panel     Status: None (Preliminary result)   Collection Time: 08/30/16  9:03 AM  Result Value Ref Range Status   Specimen Description BLOOD LEFT ARM  Final   Special Requests   Final    BOTTLES DRAWN AEROBIC AND ANAEROBIC Blood Culture adequate volume   Culture NO GROWTH 4 DAYS  Final   Report Status PENDING  Incomplete  Culture, blood (Routine X 2) w Reflex to ID Panel     Status: None (Preliminary result)   Collection Time: 08/30/16 10:18 AM  Result Value Ref Range Status   Specimen Description BLOOD LEFT ARM  Final   Special Requests   Final    BOTTLES DRAWN AEROBIC AND ANAEROBIC Blood Culture adequate volume   Culture NO GROWTH 4 DAYS  Final   Report Status PENDING  Incomplete  Urine Culture     Status: None   Collection Time: 08/30/16 12:01 PM  Result Value Ref Range Status   Specimen Description URINE, RANDOM  Final   Special Requests NONE  Final   Culture   Final    NO GROWTH Performed at [facility], [address] [place], [place] [address]    Report Status 08/31/2016 FINAL  Final    Studies/Results: No results found.  Assessment/Plan: Wandalee FerdinandSarah S Coale is a 66 y.o. female admitted with urosepsis from L obstructing nephrolithiasis with ucx, bcx + Klebsiella, now s/p L nephrostomy, improving following period of intubation, pressor requirement and CRRT. She has had removal of all central lines, rectal tube but remains with Elevated wbc up to 51, now trending down. She has gangrenous changes of bil feet and hands. Has seen vascular. Fu bcx and ucx negative.  I suspect her leukocytosis which is out of proportion to her clinical pictures is related to her gangrenous changes. She had recent repeat CT with no evidence of abscess. Has had  nephrostomy tube in place.  Day 11 of abx Hand appears to be improving. Toes gangrenous.  Recommendations Continue ancef while inpatient but can change to oral keflex when stable - would rec 14 day course Monitor wbc - recheck tomorrow Monitor LE and hands for progression to gangrene. Thank you very much for the consult. Will follow with you.  Xion Debruyne P   09/03/2016, 7:06 PM

## 2016-09-03 NOTE — Progress Notes (Signed)
[facility]  ROUNDING NOTE   Subjective:   Patient remains ill States she is able to tolerate clears. No N/V reported Urine output 1100 cc last 24 hours S Cr and BUN are slightly higher  Objective:  Vital signs in last 24 hours:  Temp:  [98.1 F (36.7 C)-98.7 F (37.1 C)] 98.3 F (36.8 C) (07/11 1424) Pulse Rate:  [81-93] 83 (07/11 1424) Resp:  [17-20] 18 (07/11 1424) BP: (127-153)/(70-106) 141/72 (07/11 1424) SpO2:  [92 %-99 %] 92 % (07/11 1424) Weight:  [95.5 kg (210 lb 9.6 oz)] 95.5 kg (210 lb 9.6 oz) (07/11 0335)  Weight change: -0.373 kg (-13.1 oz) Filed Weights   08/31/16 0340 09/02/16 0400 09/03/16 0335  Weight: 94 kg (207 lb 3.7 oz) 95.9 kg (211 lb 6.7 oz) 95.5 kg (210 lb 9.6 oz)    Intake/Output: I/O last 3 completed shifts: In: 348 [P.O.:238; I.V.:10; IV Piggyback:100] Out: 1530 [Urine:1530]   Intake/Output this shift:  Total I/O In: 240 [P.O.:240] Out: 400 [Urine:400]  Physical Exam: General: Ill appearing  Head: [place]/AT  Eyes: Anicteric,  Moist mucus membranes  Neck:  supple  Lungs:  Clear anteriorly and laterally, mild basilar crackles at bases  Heart: tachycardia  Abdomen:  Soft, nontender, obese  Extremities: + dependent edema  Neurologic: Alert, able to answer questions  Skin: Purplish lesions over toes and some fingers         Basic Metabolic Panel:  Recent Labs Lab 08/29/16 1607 08/29/16 2246 08/30/16 0343 08/31/16 0421 08/31/16 2144 09/01/16 0606 09/02/16 0322 09/03/16 0845  NA 138 139 139 139 140 138 141 140  K 4.2 3.9 4.5 4.3 3.5 3.8 4.1 4.2  CL 105 107 106 107 106 105 107 106  CO2 27 27 27 27 24 24 25 24   GLUCOSE 155* 115* 139* 142* 126* 126* 114* 133*  BUN 46* 41* 39* 56* 73* 76* 93* 90*  CREATININE 1.89* 1.69* 1.54* 2.46* 3.40* 3.56* 4.15* 4.30*  CALCIUM 8.3* 8.2* 8.5* 8.2* 8.6* 8.4* 8.3* 8.2*  MG 1.8 1.7 1.7 1.9  --  2.1  --   --   PHOS 2.9 2.6 3.0 4.1  --  5.4*  5.5* 7.2* 7.2*    Liver Function  Tests:  Recent Labs Lab 08/28/16 0420  08/30/16 0343 08/31/16 0421 09/01/16 0606 09/02/16 0322 09/03/16 0845  AST 111*  --   --  168*  --   --   --   ALT 57*  --   --  73*  --   --   --   ALKPHOS 130*  --   --  102  --   --   --   BILITOT 0.6  --   --  1.0  --   --   --   PROT 5.3*  --   --  5.3*  --   --   --   ALBUMIN 2.0*  < > 2.3* 2.2* 2.4* 2.5* 2.4*  < > = values in this interval not displayed. No results for input(s): LIPASE, AMYLASE in the last 168 hours. No results for input(s): AMMONIA in the last 168 hours.  CBC:  Recent Labs Lab 08/28/16 0420 08/29/16 0345  08/30/16 1201 08/31/16 0421 08/31/16 2144 09/01/16 0606 09/02/16 0323  WBC 34.0* 38.8*  < > 51.6* 46.9* 47.1* 41.1* 34.8*  NEUTROABS 30.7* 31.4*  --  44.9*  --   --   --   --   HGB 9.8* 8.8*  < > 8.6*  8.2* 8.7* 8.8* 8.2*  HCT 29.8* 27.2*  < > 26.6* 24.5* 26.8* 26.1* 25.6*  MCV 88.6 87.5  < > 86.3 87.1 85.1 86.8 86.7  PLT 17* 27*  < > 68* 79* 93* 93* 107*  < > = values in this interval not displayed.  Cardiac Enzymes:  Recent Labs Lab 08/28/16 0420  TROPONINI 0.72*    BNP: Invalid input(s): POCBNP  CBG:  Recent Labs Lab 09/03/16 0053 09/03/16 0351 09/03/16 0748 09/03/16 1154 09/03/16 1641  GLUCAP 112* 103* 91 131* 78    Microbiology: Results for orders placed or performed during the hospital encounter of 08/24/16  Blood Culture (routine x 2)     Status: Abnormal   Collection Time: 08/24/16  8:25 AM  Result Value Ref Range Status   Specimen Description BLOOD RIGHT ANTECUBITAL  Final   Special Requests   Final    BOTTLES DRAWN AEROBIC AND ANAEROBIC Blood Culture adequate volume   Culture  Setup Time   Final    GRAM NEGATIVE RODS IN BOTH AEROBIC AND ANAEROBIC BOTTLES CRITICAL RESULT CALLED TO, READ BACK BY AND VERIFIED WITH: JASON ROBBINS ON 08/24/16 AT 1933 QSD    Culture (A)  Final    KLEBSIELLA PNEUMONIAE SUSCEPTIBILITIES PERFORMED ON PREVIOUS CULTURE WITHIN THE LAST 5  DAYS. Performed at [facility], [address] [place], [place] [address]    Report Status 08/27/2016 FINAL  Final  Blood Culture (routine x 2)     Status: Abnormal   Collection Time: 08/24/16  8:25 AM  Result Value Ref Range Status   Specimen Description BLOOD RIGHT ANTECUBITAL  Final   Special Requests   Final    BOTTLES DRAWN AEROBIC AND ANAEROBIC Blood Culture adequate volume   Culture  Setup Time   Final    GRAM NEGATIVE RODS IN BOTH AEROBIC AND ANAEROBIC BOTTLES CRITICAL RESULT CALLED TO, READ BACK BY AND VERIFIED WITH: JASON ROBBINS ON 08/24/16 AT 1933 QSD    Culture KLEBSIELLA PNEUMONIAE (A)  Final   Report Status 08/27/2016 FINAL  Final   Organism ID, Bacteria KLEBSIELLA PNEUMONIAE  Final      Susceptibility   Klebsiella pneumoniae - MIC*    AMPICILLIN >=32 RESISTANT Resistant     CEFAZOLIN <=4 SENSITIVE Sensitive     CEFEPIME <=1 SENSITIVE Sensitive     CEFTAZIDIME <=1 SENSITIVE Sensitive     CEFTRIAXONE <=1 SENSITIVE Sensitive     CIPROFLOXACIN <=0.25 SENSITIVE Sensitive     GENTAMICIN <=1 SENSITIVE Sensitive     IMIPENEM <=0.25 SENSITIVE Sensitive     TRIMETH/SULFA <=20 SENSITIVE Sensitive     AMPICILLIN/SULBACTAM 8 SENSITIVE Sensitive     PIP/TAZO <=4 SENSITIVE Sensitive     Extended ESBL NEGATIVE Sensitive     * KLEBSIELLA PNEUMONIAE  Blood Culture ID Panel (Reflexed)     Status: Abnormal   Collection Time: 08/24/16  8:25 AM  Result Value Ref Range Status   Enterococcus species NOT DETECTED NOT DETECTED Final   Listeria monocytogenes NOT DETECTED NOT DETECTED Final   Staphylococcus species NOT DETECTED NOT DETECTED Final   Staphylococcus aureus NOT DETECTED NOT DETECTED Final   Streptococcus species NOT DETECTED NOT DETECTED Final   Streptococcus agalactiae NOT DETECTED NOT DETECTED Final   Streptococcus pneumoniae NOT DETECTED NOT DETECTED Final   Streptococcus pyogenes NOT DETECTED NOT DETECTED Final   Acinetobacter baumannii NOT DETECTED NOT DETECTED  Final   Enterobacteriaceae species DETECTED (A) NOT DETECTED Final    Comment: Enterobacteriaceae represent  a large family of gram-negative bacteria, not a single organism. CRITICAL RESULT CALLED TO, READ BACK BY AND VERIFIED WITH: JASON ROBBINS ON 08/24/16 AT 1933 QSD    Enterobacter cloacae complex NOT DETECTED NOT DETECTED Final   Escherichia coli NOT DETECTED NOT DETECTED Final   Klebsiella oxytoca NOT DETECTED NOT DETECTED Final   Klebsiella pneumoniae DETECTED (A) NOT DETECTED Final    Comment: CRITICAL RESULT CALLED TO, READ BACK BY AND VERIFIED WITH: JASON ROBBINS ON 08/24/16 AT 1933 QSD    Proteus species NOT DETECTED NOT DETECTED Final   Serratia marcescens NOT DETECTED NOT DETECTED Final   Carbapenem resistance NOT DETECTED NOT DETECTED Final   Haemophilus influenzae NOT DETECTED NOT DETECTED Final   Neisseria meningitidis NOT DETECTED NOT DETECTED Final   Pseudomonas aeruginosa NOT DETECTED NOT DETECTED Final   Candida albicans NOT DETECTED NOT DETECTED Final   Candida glabrata NOT DETECTED NOT DETECTED Final   Candida krusei NOT DETECTED NOT DETECTED Final   Candida parapsilosis NOT DETECTED NOT DETECTED Final   Candida tropicalis NOT DETECTED NOT DETECTED Final  Urine culture     Status: Abnormal   Collection Time: 08/24/16  9:19 AM  Result Value Ref Range Status   Specimen Description URINE, CATHETERIZED  Final   Special Requests NONE  Final   Culture >=100,000 COLONIES/mL KLEBSIELLA PNEUMONIAE (A)  Final   Report Status 08/26/2016 FINAL  Final   Organism ID, Bacteria KLEBSIELLA PNEUMONIAE (A)  Final      Susceptibility   Klebsiella pneumoniae - MIC*    AMPICILLIN >=32 RESISTANT Resistant     CEFAZOLIN <=4 SENSITIVE Sensitive     CEFTRIAXONE <=1 SENSITIVE Sensitive     CIPROFLOXACIN <=0.25 SENSITIVE Sensitive     GENTAMICIN <=1 SENSITIVE Sensitive     IMIPENEM <=0.25 SENSITIVE Sensitive     NITROFURANTOIN 64 INTERMEDIATE Intermediate     TRIMETH/SULFA <=20  SENSITIVE Sensitive     AMPICILLIN/SULBACTAM 4 SENSITIVE Sensitive     PIP/TAZO <=4 SENSITIVE Sensitive     Extended ESBL NEGATIVE Sensitive     * >=100,000 COLONIES/mL KLEBSIELLA PNEUMONIAE  Body fluid culture     Status: None   Collection Time: 08/24/16  3:30 PM  Result Value Ref Range Status   Specimen Description FLUID  Final   Special Requests LEFT NEPHROSTOMY  Final   Gram Stain   Final    WBC PRESENT, PREDOMINANTLY PMN GRAM NEGATIVE RODS CYTOSPIN SMEAR Performed at [facility], [address], [place], [place] [address]    Culture FEW KLEBSIELLA PNEUMONIAE  Final   Report Status 08/28/2016 FINAL  Final   Organism ID, Bacteria KLEBSIELLA PNEUMONIAE  Final      Susceptibility   Klebsiella pneumoniae - MIC*    AMPICILLIN >=32 RESISTANT Resistant     CEFAZOLIN <=4 SENSITIVE Sensitive     CEFEPIME <=1 SENSITIVE Sensitive     CEFTAZIDIME <=1 SENSITIVE Sensitive     CEFTRIAXONE <=1 SENSITIVE Sensitive     CIPROFLOXACIN <=0.25 SENSITIVE Sensitive     GENTAMICIN <=1 SENSITIVE Sensitive     IMIPENEM <=0.25 SENSITIVE Sensitive     TRIMETH/SULFA <=20 SENSITIVE Sensitive     AMPICILLIN/SULBACTAM 8 SENSITIVE Sensitive     PIP/TAZO <=4 SENSITIVE Sensitive     Extended ESBL NEGATIVE Sensitive     * FEW KLEBSIELLA PNEUMONIAE  MRSA PCR Screening     Status: None   Collection Time: 08/24/16  3:44 PM  Result Value Ref Range Status   MRSA  by PCR NEGATIVE NEGATIVE Final    Comment:        The GeneXpert MRSA Assay (FDA approved for NASAL specimens only), is one component of a comprehensive MRSA colonization surveillance program. It is not intended to diagnose MRSA infection nor to guide or monitor treatment for MRSA infections.   Culture, respiratory (NON-Expectorated)     Status: None   Collection Time: 08/25/16  7:00 AM  Result Value Ref Range Status   Specimen Description TRACHEAL ASPIRATE  Final   Special Requests NONE  Final   Gram Stain   Final    FEW WBC PRESENT,  PREDOMINANTLY PMN FEW SQUAMOUS EPITHELIAL CELLS PRESENT NO ORGANISMS SEEN    Culture   Final    Consistent with normal respiratory flora. Performed at [facility], [address] [place], [place] [address]    Report Status 08/27/2016 FINAL  Final  C difficile quick scan w PCR reflex     Status: None   Collection Time: 08/26/16  8:05 PM  Result Value Ref Range Status   C Diff antigen NEGATIVE NEGATIVE Final   C Diff toxin NEGATIVE NEGATIVE Final   C Diff interpretation No C. difficile detected.  Final  Culture, blood (Routine X 2) w Reflex to ID Panel     Status: None (Preliminary result)   Collection Time: 08/30/16  9:03 AM  Result Value Ref Range Status   Specimen Description BLOOD LEFT ARM  Final   Special Requests   Final    BOTTLES DRAWN AEROBIC AND ANAEROBIC Blood Culture adequate volume   Culture NO GROWTH 4 DAYS  Final   Report Status PENDING  Incomplete  Culture, blood (Routine X 2) w Reflex to ID Panel     Status: None (Preliminary result)   Collection Time: 08/30/16 10:18 AM  Result Value Ref Range Status   Specimen Description BLOOD LEFT ARM  Final   Special Requests   Final    BOTTLES DRAWN AEROBIC AND ANAEROBIC Blood Culture adequate volume   Culture NO GROWTH 4 DAYS  Final   Report Status PENDING  Incomplete  Urine Culture     Status: None   Collection Time: 08/30/16 12:01 PM  Result Value Ref Range Status   Specimen Description URINE, RANDOM  Final   Special Requests NONE  Final   Culture   Final    NO GROWTH Performed at [facility], [address] [place], [place] [address]    Report Status 08/31/2016 FINAL  Final    Coagulation Studies:  Recent Labs  08/31/16 2144 09/01/16 0606  LABPROT 16.3* 16.8*  INR 1.30 1.35    Urinalysis: No results for input(s): COLORURINE, LABSPEC, PHURINE, GLUCOSEU, HGBUR, BILIRUBINUR, KETONESUR, PROTEINUR, UROBILINOGEN, NITRITE, LEUKOCYTESUR in the last 72 hours.  Invalid input(s): APPERANCEUR     Imaging: No results found.   Medications:   .  ceFAZolin (ANCEF) IV Stopped (09/03/16 1050)   . citalopram  20 mg Oral Daily  . feeding supplement (NEPRO CARB STEADY)  237 mL Oral TID BM  . heparin subcutaneous  5,000 Units Subcutaneous Q8H  . insulin aspart  0-20 Units Subcutaneous Q4H  . insulin glargine  20 Units Subcutaneous Daily  . mouth rinse  15 mL Mouth Rinse q12n4p  . pantoprazole  40 mg Oral Daily  . sodium chloride flush  3 mL Intravenous Q12H   acetaminophen **OR** [DISCONTINUED] acetaminophen, albuterol, bisacodyl, hydrALAZINE, ondansetron **OR** ondansetron (ZOFRAN) IV, senna-docusate, sodium chloride flush  Assessment/ Plan:  Ms. DENETRA FORMOSO is a 66 y.o. black female with diabetes mellitus type II, hypertension, history of CVA, who was admitted to [facility] on 08/24/2016 for septic shock  1. Acute renal failure with metabolic acidosis: baseline creatinine of 0.7 on 06/12/2016.  Acute renal failure secondary obstructive uropathy, sepsis, hypotension and ATN.  Left  hydrohephrosis- Percutaneous nephrostomy placed by VIR on 08/24/16  UOP > 1000 cc ow - No acute indication for dialysis today.   - electrolytes and volume status are acceptable  2. Septic Shock: Klebsiella.  Afebrile. Leukocytosis    Klebsiella in urine  antibiotics as per primary team  3. Cirrhosis and anasarca noted on CT ? Etiology - now that blood pressure has improved, we'll consider starting Lasix in near future  Will follow    LOS: 10 Megan Nixon 7/11/20185:04 PM

## 2016-09-03 NOTE — Progress Notes (Signed)
Inpatient Rehabilitation  Met with patient and spouse at the bedside this morning to discuss team's recommendation for IP Rehab.  Shared booklets and answered questions.  They are eager for patient to regain her independence.  Plan to follow along for timing of anticipated medical readiness and therapy tolerance prior to initiation of insurance authorization with [facility].  Please call with questions.    Carmelia Roller., CCC/SLP Admission Coordinator  [place]  Cell [phone]

## 2016-09-03 NOTE — Progress Notes (Signed)
[employer] [facility] [place] [facility]   PATIENT NAME: Megan Nixon    MR#:  [id number]  DATE OF BIRTH:  07-19-1950  SUBJECTIVE:  CRRT off since 08/31/16 Urine output 1010 cc Husband at bedside Answers basic questions well. Denies any pain.  REVIEW OF SYSTEMS:    Review of Systems  Constitutional: Negative for chills, fever and weight loss.  HENT: Negative for ear discharge, ear pain and nosebleeds.   Eyes: Negative for blurred vision, pain and discharge.  Respiratory: Negative for sputum production, shortness of breath, wheezing and stridor.   Cardiovascular: Negative for chest pain, palpitations, orthopnea and PND.  Gastrointestinal: Negative for abdominal pain, diarrhea, nausea and vomiting.  Genitourinary: Negative for frequency and urgency.  Musculoskeletal: Negative for back pain and joint pain.  Neurological: Positive for weakness. Negative for sensory change, speech change and focal weakness.  Psychiatric/Behavioral: Negative for depression and hallucinations. The patient is not nervous/anxious.    DRUG ALLERGIES:  No Known Allergies  VITALS:  Blood pressure (!) 146/85, pulse 91, temperature 98.7 F (37.1 C), temperature source Oral, resp. rate 17, height 5\' 8"  (1.727 m), weight 95.5 kg (210 lb 9.6 oz), SpO2 99 %.  PHYSICAL EXAMINATION:   Physical Exam  GENERAL:  66 y.o.-year-old patient lying in the bed. Looks critically ill, Limited exam.Extubated EYES: Pupils equal, round, reactive to light HEENT: Head atraumatic, normocephalic. Oropharynx and nasopharynx clear. ET tube in place NECK:  Supple, no jugular venous distention. No thyroid enlargement, no tenderness.  LUNGS: Bilateral decreased breath sounds bases. No use of accessory muscles. CARDIOVASCULAR: S1, S2 normal. Tachycardia, no murmur ABDOMEN: Soft, nontender, nondistended. Bowel sounds present. No organomegaly or mass.  EXTREMITIES: Patient does have cyanosis/ischemia bilateral lower  extremity left more than right 3+ pitting edema both lower extremities    Cerebrovascular system moves all extremities well. Generalized deconditioning. Nonfocal grossly.; weak PSYCHIATRY: flat affect  LABORATORY PANEL:   CBC  Recent Labs Lab 09/02/16 0323  WBC 34.8*  HGB 8.2*  HCT 25.6*  PLT 107*   ------------------------------------------------------------------------------------------------------------------ Chemistries   Recent Labs Lab 08/31/16 0421  09/01/16 0606  09/03/16 0845  NA 139  < > 138  < > 140  K 4.3  < > 3.8  < > 4.2  CL 107  < > 105  < > 106  CO2 27  < > 24  < > 24  GLUCOSE 142*  < > 126*  < > 133*  BUN 56*  < > 76*  < > 90*  CREATININE 2.46*  < > 3.56*  < > 4.30*  CALCIUM 8.2*  < > 8.4*  < > 8.2*  MG 1.9  --  2.1  --   --   AST 168*  --   --   --   --   ALT 73*  --   --   --   --   ALKPHOS 102  --   --   --   --   BILITOT 1.0  --   --   --   --   < > = values in this interval not displayed. ------------------------------------------------------------------------------------------------------------------  Cardiac Enzymes  Recent Labs Lab 08/28/16 0420  TROPONINI 0.72*   ------------------------------------------------------------------------------------------------------------------  RADIOLOGY:  No results found.   ASSESSMENT AND PLAN:   * Septic shock Due to Klebsiella bacteremia source urine Patient now extubated -Off IV pressors -White count up to 38,000---47,000--51,000---46K--41K---34K - No fever -CT abdomen shows no abcess -C diff negative on 08/26/16, rectal  tube removed.  * Left pyelonephritis with UPJ calculus and hydro-nephrosis Status post percutaneous nephrostomy tube placement on admission  -urine culture positive for Klebsiella -Patient was on IV Rocephin----was on IV meropenem and vancomycin---now on IV cefazolin -ID consultation with dr Sampson GoonFitzgerald appreciated -CT abdomen did not show any any fluid  collection/abscess.  * Acute kidney injury/pain unit secondary to septic shock ATN -On CRRT--- which is discontinued on 08/30/2016 -Dialysis catheter removed. Central line removed.  * ARDS with Acute hypoxic respiratory failure --Status post mechanical ventilator now extubated   *Severe DIC due to #1 -Patient receiving platelets and FFP's according to her labs -Platelet count 79,000 much better. No active bleeding.  *Bilateral lower extremity ischemia more on the left than right in the setting of sepsis/DIC  -Vascular recommendations noted. Patient has good peripheral pedal pulses.  *PT recommends CIR. Patient has been evaluated. She'll benefit from inpatient rehabilitation. Awaiting insurance authorization. Patient is improving medically. Patient is ready for discharge to inpatient rehabilitation once insurance authorization approved  All the records are reviewed and case discussed with Care Management/Social Workerr. Management plans discussed with the patient, family and they are in agreement.  CODE STATUS: FULL CODE  DVT Prophylaxis: heparin  TOTAL TIME TAKING CARE OF THIS PATIENT: 25 minutes.    Addalyn Speedy M.D on 09/03/2016 at 1:12 PM  Between 7am to 6pm - Pager - [phone]  After 6pm go to [email or web address] - password [email or web address]  [facility]  Office  [phone]  CC: Primary care physician; Danella PentonMiller, Mark F, MD  Note: This dictation was prepared with Dragon dictation along with smaller phrase technology. Any transcriptional errors that result from this process are unintentional.

## 2016-09-03 NOTE — Progress Notes (Signed)
CH. made a follow up visit with Pt. and family to offer spiritual support. Pt. has made recovery progress.

## 2016-09-03 NOTE — Progress Notes (Signed)
Pt did not tolerate Nepro, did not like it.

## 2016-09-03 NOTE — Progress Notes (Signed)
Occupational Therapy Treatment Patient Details Name: Megan FerdinandSarah S Shankman MRN: [id number] DOB: [birth year] Today's Date: 09/03/2016    History of present illness Pt. is a 66 y.o. female who was [facility] with septic Shock, UTI, and Multifocal Pneumonia. Pt. PMHx includes: HTN, DMII, Kidney stones, and CVA.   OT comments  Pt. Tolerated AROM in her BUEs in all joint ranges. Pt. Was assisted with repositioning in bed. Pt. Education was provided about A/E use, energy conservation, and work simplification skills. Pt. Was provided with a visual handout. Pt. continues to benefit from skilled OT services to work on improving functional performance, and work towards regaing strength, and independence with daily ADL/IADL tasks. Pt. Would benefit from CIR with follow-up OT services. Pt. Is insurance pending CIR approval for admission.   Follow Up Recommendations  CIR    Equipment Recommendations       Recommendations for Other Services      Precautions / Restrictions Precautions Precautions: Fall Restrictions Weight Bearing Restrictions: No                                                     ADL either performed or assessed with clinical judgement   ADL Overall ADL's : Needs assistance/impaired Eating/Feeding: Set up;Bed level   Grooming: Set up;Bed level   Upper Body Bathing: Minimal assistance;Bed level   Lower Body Bathing: Moderate assistance   Upper Body Dressing : Minimal assistance;Bed level   Lower Body Dressing: Bed level;Moderate assistance                 General ADL Comments: Pt. education was provided about A/E use for LE ADLs, and energy conservation, work simplification techniques.     Vision  No change from baseline     Perception     Praxis      Cognition Arousal/Alertness: Awake/alert Behavior During Therapy: WFL for tasks assessed/performed Overall Cognitive Status: Within Functional Limits for tasks assessed                                           Exercises     Shoulder Instructions       General Comments      Pertinent Vitals/ Pain          Home Living                                          Prior Functioning/Environment              Frequency  Min 3X/week        Progress Toward Goals  OT Goals(current goals can now be found in the care plan section)  Progress towards OT goals: Progressing toward goals  Acute Rehab OT Goals Patient Stated Goal: To get better OT Goal Formulation: With patient/family Potential to Achieve Goals: Good  Plan Discharge plan remains appropriate    Co-evaluation                 AM-PAC PT "6 Clicks" Daily Activity     Outcome Measure   Help from another person eating meals?: None Help from another person  taking care of personal grooming?: None Help from another person toileting, which includes using toliet, bedpan, or urinal?: A Lot Help from another person bathing (including washing, rinsing, drying)?: A Lot Help from another person to put on and taking off regular upper body clothing?: A Little Help from another person to put on and taking off regular lower body clothing?: A Lot 6 Click Score: 17    End of Session    OT Visit Diagnosis: Muscle weakness (generalized) (M62.81)   Activity Tolerance Patient tolerated treatment well   Patient Left in bed;with call bell/phone within reach;with family/visitor present;with bed alarm set   Nurse Communication          Time: 1610-9604 OT Time Calculation (min): 30 min  Charges: OT General Charges $OT Visit: 1 Procedure OT Treatments $Self Care/Home Management : 23-37 mins  Olegario Messier, MS, OTR/L    Olegario Messier, MS, OTR/L 09/03/2016, 11:52 AM

## 2016-09-03 NOTE — Care Management (Signed)
Attending says that patient is medically stable for discharge today either to acute inpatient re[facility] or SNF.  Patient was evaluated by Fae PippinMelissa Bowie from CIR .  Cm did not get to speak with CIR representative.  left a voicemail that attending has indicated patient is medically stable for discharge today.  Either plan will require insurance approval

## 2016-09-03 NOTE — Plan of Care (Signed)
Problem: Respiratory: Goal: Ability to maintain adequate ventilation will improve Outcome: Progressing Patient weaned to room air. Will continue to monitor.

## 2016-09-03 NOTE — Progress Notes (Signed)
Urology Consult Follow Up  Subjective: Patient is now extubated, off pressors, and on the floor. Urine output has improved, 1100 cc and creatinine continues to climb slowly after cessation of CRRT.  Stopped by today to discuss definitive management of her stone. Most recent CT scan on 08/30/2016 shows the obstructing stone now back within the renal pelvis appeared to be inside the nephrostomy tube coil.     Anti-infectives: Anti-infectives    Start     Dose/Rate Route Frequency Ordered Stop   09/02/16 1800  ceFAZolin (ANCEF) IVPB 1 g/50 mL premix  Status:  Discontinued     1 g 100 mL/hr over 30 Minutes Intravenous Every 24 hours 09/02/16 0834 09/02/16 1626   09/02/16 1700  ceFAZolin (ANCEF) IVPB 1 g/50 mL premix     1 g 100 mL/hr over 30 Minutes Intravenous Every 12 hours 09/02/16 1626     09/01/16 2200  vancomycin (VANCOCIN) IVPB 1000 mg/200 mL premix  Status:  Discontinued     1,000 mg 200 mL/hr over 60 Minutes Intravenous Every 36 hours 08/31/16 1156 09/01/16 1103   09/01/16 1800  ceFAZolin (ANCEF) IVPB 2g/100 mL premix  Status:  Discontinued     2 g 200 mL/hr over 30 Minutes Intravenous Every 24 hours 09/01/16 1103 09/02/16 0834   08/30/16 1000  vancomycin (VANCOCIN) IVPB 1000 mg/200 mL premix  Status:  Discontinued     1,000 mg 200 mL/hr over 60 Minutes Intravenous Every 24 hours 08/29/16 1238 08/31/16 1156   08/29/16 0930  vancomycin (VANCOCIN) 1,750 mg in sodium chloride 0.9 % 500 mL IVPB     1,750 mg 250 mL/hr over 120 Minutes Intravenous  Once 08/29/16 0843 08/29/16 1153   08/28/16 1000  cefTRIAXone (ROCEPHIN) 2 g in dextrose 5 % 50 mL IVPB  Status:  Discontinued     2 g 100 mL/hr over 30 Minutes Intravenous Every 12 hours 08/28/16 0858 08/28/16 0915   08/28/16 1000  meropenem (MERREM) 1 g in sodium chloride 0.9 % 100 mL IVPB  Status:  Discontinued     1 g 200 mL/hr over 30 Minutes Intravenous Every 12 hours 08/28/16 0935 09/01/16 1103   08/26/16 1100  cefTRIAXone (ROCEPHIN)  2 g in dextrose 5 % 50 mL IVPB  Status:  Discontinued     2 g 100 mL/hr over 30 Minutes Intravenous Every 24 hours 08/26/16 1013 08/28/16 0858   08/25/16 1800  azithromycin (ZITHROMAX) 500 mg in dextrose 5 % 250 mL IVPB  Status:  Discontinued     500 mg 250 mL/hr over 60 Minutes Intravenous Every 24 hours 08/24/16 1708 08/24/16 1947   08/24/16 2000  piperacillin-tazobactam (ZOSYN) IVPB 3.375 g  Status:  Discontinued     3.375 g 12.5 mL/hr over 240 Minutes Intravenous Every 12 hours 08/24/16 1404 08/24/16 1405   08/24/16 1600  piperacillin-tazobactam (ZOSYN) IVPB 3.375 g  Status:  Discontinued     3.375 g 12.5 mL/hr over 240 Minutes Intravenous Every 8 hours 08/24/16 1405 08/26/16 1013   08/24/16 1545  cefTRIAXone (ROCEPHIN) 1 g in dextrose 5 % 50 mL IVPB     1 g 100 mL/hr over 30 Minutes Intravenous  Once 08/24/16 1543 08/24/16 1656   08/24/16 1545  azithromycin (ZITHROMAX) 500 mg in dextrose 5 % 250 mL IVPB     500 mg 250 mL/hr over 60 Minutes Intravenous  Once 08/24/16 1543 08/24/16 1801   08/24/16 1415  vancomycin (VANCOCIN) IVPB 1000 mg/200 mL premix  Status:  Discontinued  1,000 mg 200 mL/hr over 60 Minutes Intravenous STAT 08/24/16 1403 08/24/16 1948   08/24/16 0915  vancomycin (VANCOCIN) IVPB 1000 mg/200 mL premix     1,000 mg 200 mL/hr over 60 Minutes Intravenous  Once 08/24/16 0910 08/24/16 1145   08/24/16 0915  piperacillin-tazobactam (ZOSYN) IVPB 3.375 g     3.375 g 100 mL/hr over 30 Minutes Intravenous  Once 08/24/16 0910 08/24/16 1145      Current Facility-Administered Medications  Medication Dose Route Frequency Provider Last Rate Last Dose  . acetaminophen (TYLENOL) tablet 650 mg  650 mg Per Tube Q6H PRN Merwyn Katos, MD      . albuterol (PROVENTIL) (2.5 MG/3ML) 0.083% nebulizer solution 2.5 mg  2.5 mg Nebulization Q2H PRN Shaune Pollack, MD      . bisacodyl (DULCOLAX) EC tablet 5 mg  5 mg Oral Daily PRN Shaune Pollack, MD      . ceFAZolin (ANCEF) IVPB 1 g/50 mL  premix  1 g Intravenous Q12H Enedina Finner, MD   Stopped at 09/03/16 1050  . citalopram (CELEXA) tablet 20 mg  20 mg Oral Daily Conforti, John, DO   20 mg at 09/03/16 1020  . feeding supplement (NEPRO CARB STEADY) liquid 237 mL  237 mL Oral TID BM Enedina Finner, MD      . heparin injection 5,000 Units  5,000 Units Subcutaneous Q8H Enedina Finner, MD   5,000 Units at 09/03/16 1441  . hydrALAZINE (APRESOLINE) injection 10 mg  10 mg Intravenous Q4H PRN Eugenie Norrie, NP   10 mg at 09/01/16 0907  . insulin aspart (novoLOG) injection 0-20 Units  0-20 Units Subcutaneous Q4H Varughese, Bincy S, NP   3 Units at 09/03/16 1213  . insulin glargine (LANTUS) injection 20 Units  20 Units Subcutaneous Daily Enedina Finner, MD   20 Units at 09/03/16 1056  . MEDLINE mouth rinse  15 mL Mouth Rinse q12n4p Conforti, John, DO   15 mL at 09/03/16 1200  . ondansetron (ZOFRAN) tablet 4 mg  4 mg Oral Q6H PRN Shaune Pollack, MD       Or  . ondansetron [facility]) injection 4 mg  4 mg Intravenous Q6H PRN Shaune Pollack, MD   4 mg at 08/31/16 2108  . pantoprazole (PROTONIX) EC tablet 40 mg  40 mg Oral Daily Erin Fulling, MD   40 mg at 09/03/16 1020  . senna-docusate (Senokot-S) tablet 1 tablet  1 tablet Oral QHS PRN Shaune Pollack, MD      . sodium chloride flush (NS) 0.9 % injection 3 mL  3 mL Intravenous Q12H Enedina Finner, MD   3 mL at 09/03/16 1020  . sodium chloride flush (NS) 0.9 % injection 3 mL  3 mL Intravenous PRN Enedina Finner, MD         Objective: Vital signs in last 24 hours: Temp:  [98.1 F (36.7 C)-98.7 F (37.1 C)] 98.7 F (37.1 C) (07/11 1955) Pulse Rate:  [83-93] 90 (07/11 1955) Resp:  [17-19] 19 (07/11 1955) BP: (131-153)/(49-106) 131/49 (07/11 1955) SpO2:  [90 %-99 %] 90 % (07/11 1955) Weight:  [210 lb 9.6 oz (95.5 kg)] 210 lb 9.6 oz (95.5 kg) (07/11 0335)  Intake/Output from previous day: 07/10 0701 - 07/11 0700 In: 248 [P.O.:238; I.V.:10] Out: 1125 [Urine:1125] Intake/Output this shift: No intake/output data  recorded.   Physical Exam  Resting in bed comfortably. Husband at bedside. No respiratory distress. Abdomen soft, nondistended. Ischemic changes on fingertips and extremities appreciated. Left nephrostomy tube draining clear  yellow urine.  Lab Results:   Recent Labs  09/01/16 0606 09/02/16 0323  WBC 41.1* 34.8*  HGB 8.8* 8.2*  HCT 26.1* 25.6*  PLT 93* 107*   BMET  Recent Labs  09/02/16 0322 09/03/16 0845  NA 141 140  K 4.1 4.2  CL 107 106  CO2 25 24  GLUCOSE 114* 133*  BUN 93* 90*  CREATININE 4.15* 4.30*  CALCIUM 8.3* 8.2*   PT/INR  Recent Labs  08/31/16 2144 09/01/16 0606  LABPROT 16.3* 16.8*  INR 1.30 1.35   ABG No results for input(s): PHART, HCO3 in the last 72 hours.  Invalid input(s): PCO2, PO2  Studies/Results: No results found.   Assessment/plan:  Definitive management of obstructing ureteral calculus discussed a length today. It appears that the stone is now migrated back into the renal pelvis within the coil of the nephrostomy tube. She also has a larger lower pole stone which is nonobstructing. Patient states that she's had shockwave lithotripsy in the past which was successful. Alternative to shockwave lithotripsy including ureteroscopy was discussed at length. She understands that she will need to complete her course of antibiotics, clinically improve before undertaking definitive management of her stone.  Plan for KUB today to assess whether or not the stone is visible. Based on this, we'll plan for outpatient shockwave lithotripsy versus ureteroscopy depending on visualization of the stone.  We arrange for outpatient follow-up approximately one week after discharge.  Urology will sign off.    LOS: 10 days    Vanna Scotlandshley Kaleyah Labreck 09/03/2016

## 2016-09-03 NOTE — Progress Notes (Signed)
Inpatient Rehabilitation  [place] voic[place] from [place] CM regarding medical readiness today.  Updated her as she has assumed this case [facility].  Will initiate insurance authorization; however unlikely to receive authorization same day.  Plan to update the team as I know.  Please call with questions.   Charlane FerrettiMelissa Cyrilla Durkin, M.A., CCC/SLP Admission Coordinator  [facility]  Cell [phone]

## 2016-09-04 ENCOUNTER — Telehealth: Payer: Self-pay | Admitting: Urology

## 2016-09-04 LAB — RENAL FUNCTION PANEL
ANION GAP: 9 (ref 5–15)
Albumin: 2.3 g/dL — ABNORMAL LOW (ref 3.5–5.0)
BUN: 83 mg/dL — ABNORMAL HIGH (ref 6–20)
CALCIUM: 8 mg/dL — AB (ref 8.9–10.3)
CO2: 25 mmol/L (ref 22–32)
Chloride: 107 mmol/L (ref 101–111)
Creatinine, Ser: 3.93 mg/dL — ABNORMAL HIGH (ref 0.44–1.00)
GFR calc non Af Amer: 11 mL/min — ABNORMAL LOW (ref >60)
GFR, EST AFRICAN AMERICAN: 13 mL/min — AB (ref >60)
Glucose, Bld: 77 mg/dL (ref 65–99)
PHOSPHORUS: 6 mg/dL — AB (ref 2.5–4.6)
Potassium: 3.7 mmol/L (ref 3.5–5.1)
SODIUM: 141 mmol/L (ref 135–145)

## 2016-09-04 LAB — CBC
HEMATOCRIT: 23.9 % — AB (ref 35.0–47.0)
HEMOGLOBIN: 7.9 g/dL — AB (ref 12.0–16.0)
MCH: 28.3 pg (ref 26.0–34.0)
MCHC: 33.2 g/dL (ref 32.0–36.0)
MCV: 85.3 fL (ref 80.0–100.0)
Platelets: 141 10*3/uL — ABNORMAL LOW (ref 150–440)
RBC: 2.81 MIL/uL — ABNORMAL LOW (ref 3.80–5.20)
RDW: 16.9 % — ABNORMAL HIGH (ref 11.5–14.5)
WBC: 18.1 10*3/uL — ABNORMAL HIGH (ref 3.6–11.0)

## 2016-09-04 LAB — GLUCOSE, CAPILLARY
GLUCOSE-CAPILLARY: 89 mg/dL (ref 65–99)
Glucose-Capillary: 70 mg/dL (ref 65–99)
Glucose-Capillary: 94 mg/dL (ref 65–99)
Glucose-Capillary: 98 mg/dL (ref 65–99)
Glucose-Capillary: 106 mg/dL — ABNORMAL HIGH (ref 65–99)
Glucose-Capillary: 114 mg/dL — ABNORMAL HIGH (ref 65–99)

## 2016-09-04 LAB — CULTURE, BLOOD (ROUTINE X 2)
CULTURE: NO GROWTH
Culture: NO GROWTH
SPECIAL REQUESTS: ADEQUATE
Special Requests: ADEQUATE

## 2016-09-04 MED ORDER — FUROSEMIDE 40 MG PO TABS
40.0000 mg | ORAL_TABLET | Freq: Every day | ORAL | Status: DC
  Administered 2016-09-04 – 2016-09-05 (×2): 40 mg via ORAL
  Filled 2016-09-04 (×2): qty 1

## 2016-09-04 MED ORDER — INSULIN ASPART 100 UNIT/ML ~~LOC~~ SOLN
0.0000 [IU] | Freq: Three times a day (TID) | SUBCUTANEOUS | Status: DC
  Administered 2016-09-05 (×2): 1 [IU] via SUBCUTANEOUS
  Filled 2016-09-04 (×2): qty 1

## 2016-09-04 MED ORDER — CEPHALEXIN 250 MG PO CAPS
250.0000 mg | ORAL_CAPSULE | Freq: Two times a day (BID) | ORAL | Status: DC
  Administered 2016-09-04 – 2016-09-05 (×3): 250 mg via ORAL
  Filled 2016-09-04 (×4): qty 1

## 2016-09-04 NOTE — Clinical Social Work Note (Signed)
Clinical Social Work Assessment  Patient Details  Name: Megan FerdinandSarah S Nixon MRN: [id number] Date of Birth: [birth year]  Date of referral:  09/04/16               Reason for consult:  Facility Placement                Permission sought to share information with:  Family Supports, Magazine features editoracility Contact Representative Permission granted to share information::  Yes, Verbal Permission Granted  Name::     Megan NettlesStanfield,Megan Nixon [phone]   Agency::  SNF admissions  Relationship::     Contact Information:     Housing/Transportation Living arrangements for the past 2 months:  Single Family Home Source of Information:  Patient, Adult Children Patient Interpreter Needed:  None Criminal Activity/Legal Involvement Pertinent to Current Situation/Hospitalization:  No - Comment as needed Significant Relationships:  Adult Children, Nixon Lives with:  Spouse Do you feel safe going back to the place where you live?  No Need for family participation in patient care:  No (Coment)  Care giving concerns:  Patient and family feel she needs short term rehab before she is able to return back home.   Social Worker assessment / plan:  Patient is a 66 year old female who is alert and oriented x4 and married.  Patient states she has not been to SNF for rehab before, CSW explained to patient and her family what to expect at SNF and how insurance will pay for stay.  CSW explained what the process is for going to SNF for short term rehab and role of CSW.  CSW discussed with patient and her family the advantages of going to SNF verse going home with home health.  Patient's family is hesitant about going to SNF, but are agreeable to have CS[place].  Patient's family did not have any other questions or concerns.   Employment status:  Retired Database administratornsurance information:  Managed Medicare PT Recommendations:  Inpatient Rehab Consult, Skilled Nursing Facility Information / Referral to  community resources:  Skilled Nursing Facility  Patient/Family's Response to care:  Patient and family are agreeable to going to SNF for short term rehab.  Patient/Family's Understanding of and Emotional Response to Diagnosis, Current Treatment, and Prognosis: Patient stated she is hopeful that she will not have to be in SNF for very long.  Emotional Assessment Appearance:  Appears stated age Attitude/Demeanor/Rapport:    Affect (typically observed):  Calm, Pleasant Orientation:  Oriented to Self, Oriented to Place, Oriented to  Time, Oriented to Situation Alcohol / Substance use:  Not Applicable Psych involvement (Current and /or in the community):  No (Comment)  Discharge Needs  Concerns to be addressed:  Lack of Support Readmission within the last 30 days:  No Current discharge risk:  Lack of support system Barriers to Discharge:  English as a second language teachernsurance Authorization, Continued Medical Work up   [place] [employer], Megan Nixon R, LCSWA 09/04/2016, 4:08 PM

## 2016-09-04 NOTE — Telephone Encounter (Signed)
Appointment has been made   [facility]

## 2016-09-04 NOTE — Progress Notes (Signed)
Inpatient Rehabilitation  Continuing to follow for timing of medical readiness.  Note WBC is 18.1 today with ID following, await renal's plans for lasix, and patient with a pending KUB.  We await insurance authorization from [employer] as well.  Discussed case with Hilda BladesNann, RN CM.  Plan to [facility] when I have a decision.  Please call with questions.  Charlane FerrettiMelissa Henlee Donovan, M.A., CCC/SLP Admission Coordinator  [facility]  Cell [phone]

## 2016-09-04 NOTE — Care Management (Addendum)
Renal status is improving.  [employer] denied CIR and attending is initiating a peer to peer.  CSW updated and will pursue SNF as it is reported Francine Gra[place] stated would approve SNF.  Per attending, anticipate discharge within next 24 hours either to CIR or SNF.  Updated patient and family.  At first stated "there is no way she is going to a nursing home."  CM discussed discharge home with home health and rehab but would not be daily.  Daughter  then stated she would [place] but [facility].  Updated CSW.

## 2016-09-04 NOTE — Care Management (Signed)
Update from attending regarding peer to peer.  There is a need for therapy notes (physical therapy and occupational therapy) to reflect the overall progress patient has made, and potential to participate in at least 3 hours of therapy a day.  Explained this again to patient and her husband. Discussed with patient the need to push self to participate.  Discussed the level of assistance that will be need in the home if chose to discharge home with home health services

## 2016-09-04 NOTE — Progress Notes (Signed)
Inpatient Rehabilitation  Received notification from [facility] [employer] that they have denied IP Rehab coverage at this time.  I have updated patient's spouse and Production managerann RN CM.  Will await updated PT note to determine if an appeal is warranted.  Please call with questions.   Charlane FerrettiMelissa Darnel Mchan, M.A., CCC/SLP Admission Coordinator  [facility]  Cell [phone]

## 2016-09-04 NOTE — Progress Notes (Signed)
Physical Therapy Treatment Patient Details Name: Megan Nixon MRN: [id number] DOB: 02-10-1951 Today's Date: 09/04/2016    History of Present Illness Pt. is a 66 y.o. female who [facility] with septic Shock, UTI, and Multifocal Pneumonia. Pt. PMHx includes: HTN, DMII, Kidney stones, and CVA.    PT Comments    Pt in bed agrees to session.  Pt was assisted to edge of bed with min assist x 1.  She was given increased time to complete transition.  She was able to remain sitting edge of bed with min guard/supervision without LOB and was able to reposition herself without assist as needed.  Prior to standing, she was able to participate in seated exercises as described below with A/AAROM due to weakness.  She was allowed rest breaks as needed from exercises.  CNA in to assist with standing and attempts at standing exercises.  She stood with min a x 2 and walker.  Decreased safety is noted and will require education as she attempts to pull up on walker.  She is unable to do heel raises in standing and quickly sits back down on bed as she attempts.  She was noted to be incontinent of a small BM and new pads were obtained.  She was able to stand for cleaning as appropriate x 2.  At this time she became generally fatigued and was assisted back to supine with mod a x 1 for LE management.    CIR remains appropriate for pt upon discharge.  She is motivated to increase her overall functional mobility and strength.  "I'll try"  While she remains globally weak with limited mobility at this time, she is motivated and has good family supports.  Discussed with MD, care manager and primary PT.   Follow Up Recommendations  CIR     Equipment Recommendations  Rolling walker with 5" wheels;Other (comment)    Recommendations for Other Services       Precautions / Restrictions Precautions Precautions: Fall Restrictions Weight Bearing Restrictions: No    Mobility  Bed Mobility Overal bed  mobility: Needs Assistance Bed Mobility: Supine to Sit;Sit to Supine     Supine to sit: Min assist Sit to supine: Mod assist      Transfers Overall transfer level: Needs assistance Equipment used: Rolling walker (2 wheeled) Transfers: Sit to/from Stand Sit to Stand: Min assist;+2 physical assistance         General transfer comment: Pt stood x 3 with increased assist and decreased quality each session.  Ambulation/Gait             General Gait Details: Unsafe at this time.     Stairs            Wheelchair Mobility    Modified Rankin (Stroke Patients Only)       Balance Overall balance assessment: Needs assistance Sitting-balance support: Bilateral upper extremity supported Sitting balance-Leahy Scale: Fair Sitting balance - Comments: She was able to remain upright at edge of bed x 15 minutes today    Standing balance support: Bilateral upper extremity supported Standing balance-Leahy Scale: Fair Standing balance comment: Requires continual UE support on rolling walker. Balance primarily impaired by bilateral LE weakness                            Cognition Arousal/Alertness: Awake/alert Behavior During Therapy: WFL for tasks assessed/performed Overall Cognitive Status: Within Functional Limits for tasks assessed  Exercises Other Exercises Other Exercises: sat edge of bed x 15 minutes without LOB Other Exercises: seated LAQ, marches, pillow squeezes and ankle pumps 2 x 10 A/AAROM in usupported sitting.    General Comments        Pertinent Vitals/Pain Pain Assessment: No/denies pain    Home Living                      Prior Function            PT Goals (current goals can now be found in the care plan section) Progress towards PT goals: Progressing toward goals    Frequency    Min 2X/week      PT Plan Current plan remains appropriate    Co-evaluation               AM-PAC PT "6 Clicks" Daily Activity  Outcome Measure  Difficulty turning over in bed (including adjusting bedclothes, sheets and blankets)?: A Little Difficulty moving from lying on back to sitting on the side of the bed? : Total Difficulty sitting down on and standing up from a chair with arms (e.g., wheelchair, bedside commode, etc,.)?: Total Help needed moving to and from a bed to chair (including a wheelchair)?: A Lot Help needed walking in hospital room?: A Lot Help needed climbing 3-5 steps with a railing? : Total 6 Click Score: 10    End of Session Equipment Utilized During Treatment: Gait belt Activity Tolerance: Patient tolerated treatment well Patient left: in bed;with bed alarm set;with call bell/phone within reach;with family/visitor present         Time: 1610-9604 PT Time Calculation (min) (ACUTE ONLY): 23 min  Charges:  $Therapeutic Exercise: 8-22 mins $Therapeutic Activity: 8-22 mins                    G Codes:      Glen Blatchley, PTA 09/04/16, 12:25 PM

## 2016-09-04 NOTE — NC FL2 (Signed)
[place] MEDICAID FL2 LEVEL OF CARE SCREENING TOOL     IDENTIFICATION  Patient Name: Megan FerdinandSarah S Gatliff Birthdate: 11-15-1950 Sex: female Admission Date (Current Location): 08/24/2016  [place]   Facility and Address:  [facility], [address], [place], [place] [address]      Provider Number: [id number]  Attending Physician Name and Address:  Enedina FinnerPatel, Sona, MD  Relative Name and Phone Number:  Dorthula NettlesStanfield,Roger L Spouse [phone]     Current Level of Care: Hospital Recommended Level of Care: Skilled Nursing Facility Prior Approval Number:    Date Approved/Denied:   PASRR Number: [id number]  Discharge Plan: SNF    Current Diagnoses: Patient Active Problem List   Diagnosis Date Noted  . Sepsis (HCC)   . Acute renal failure (HCC)   . Kidney stone on left side   . Septic shock (HCC) 08/24/2016    Orientation RESPIRATION BLADDER Height & Weight     Self, Time, Situation, Place  Normal Continent Weight: 207 lb 4.8 oz (94 kg) Height:  5\' 8"  (172.7 cm)  BEHAVIORAL SYMPTOMS/MOOD NEUROLOGICAL BOWEL NUTRITION STATUS      Continent Diet (Carb Modifid Renal diet)  AMBULATORY STATUS COMMUNICATION OF NEEDS Skin   Limited Assist Verbally Surgical wounds                       Personal Care Assistance Level of Assistance  Bathing, Dressing, Feeding Bathing Assistance: Limited assistance Feeding assistance: Independent Dressing Assistance: Limited assistance     Functional Limitations Info  Sight, Hearing, Speech Sight Info: Adequate Hearing Info: Adequate Speech Info: Adequate    SPECIAL CARE FACTORS FREQUENCY  PT (By licensed PT), OT (By licensed OT)     PT Frequency: 5x a week OT Frequency: 5x a week            Contractures Contractures Info: Not present    Additional Factors Info  Code Status, Allergies, Psychotropic, Insulin Sliding Scale Code Status Info: Full Code Allergies Info:  NKA Psychotropic Info: citalopram (CELEXA) tablet 20 mg Insulin Sliding Scale Info: insulin aspart (novoLOG) injection 0-9 Units 3x a day with meals       Current Medications (09/04/2016):  This is the current hospital active medication list Current Facility-Administered Medications  Medication Dose Route Frequency Provider Last Rate Last Dose  . acetaminophen (TYLENOL) tablet 650 mg  650 mg Per Tube Q6H PRN Merwyn KatosSimonds, David B, MD      . albuterol (PROVENTIL) (2.5 MG/3ML) 0.083% nebulizer solution 2.5 mg  2.5 mg Nebulization Q2H PRN Shaune Pollackhen, Qing, MD      . bisacodyl (DULCOLAX) EC tablet 5 mg  5 mg Oral Daily PRN Shaune Pollackhen, Qing, MD      . cephALEXin [facility](KEFLEX) capsule 250 mg  250 mg Oral Q12H Enedina FinnerPatel, Sona, MD   250 mg at 09/04/16 1231  . citalopram (CELEXA) tablet 20 mg  20 mg Oral Daily Conforti, John, DO   20 mg at 09/04/16 0909  . feeding supplement (NEPRO CARB STEADY) liquid 237 mL  237 mL Oral TID BM Enedina FinnerPatel, Sona, MD   237 mL at 09/03/16 2136  . furosemide (LASIX) tablet 40 mg  40 mg Oral Daily Mosetta PigeonSingh, Harmeet, MD      . heparin injection 5,000 Units  5,000 Units Subcutaneous Q8H Enedina FinnerPatel, Sona, MD   5,000 Units at 09/04/16 0600  . hydrALAZINE (APRESOLINE) injection 10 mg  10 mg Intravenous Q4H PRN Eugenie NorrieBlakeney, Dana G, NP  10 mg at 09/01/16 0907  . insulin aspart (novoLOG) injection 0-9 Units  0-9 Units Subcutaneous TID WC Enedina Finner, MD      . MEDLINE mouth rinse  15 mL Mouth Rinse q12n4p Conforti, John, DO   15 mL at 09/04/16 1231  . ondansetron (ZOFRAN) tablet 4 mg  4 mg Oral Q6H PRN Shaune Pollack, MD       Or  . ondansetron [facility]) injection 4 mg  4 mg Intravenous Q6H PRN Shaune Pollack, MD   4 mg at 08/31/16 2108  . pantoprazole (PROTONIX) EC tablet 40 mg  40 mg Oral Daily Erin Fulling, MD   40 mg at 09/04/16 0909  . senna-docusate (Senokot-S) tablet 1 tablet  1 tablet Oral QHS PRN Shaune Pollack, MD      . sodium chloride flush (NS) 0.9 % injection 3 mL  3 mL Intravenous Q12H Enedina Finner, MD   3 mL at  09/03/16 2200  . sodium chloride flush (NS) 0.9 % injection 3 mL  3 mL Intravenous PRN Enedina Finner, MD         Discharge Medications: Please see discharge summary for a list of discharge medications.  Relevant Imaging Results:  Relevant Lab Results:   Additional Information SSN [id number]  Darleene Cleaver, [place]

## 2016-09-04 NOTE — Progress Notes (Signed)
Chaplain rounding unit visited with pt. CH met with pt, pt's family were at the bedside. Pt talked briefly with CH, but the rest of the family member spoke about life and other things that are important to them. Pt and family states they were concerned about the timeline when the pt will be transferred to a therapy facility. [place] encouraged pt and family and wished pt a quick recovery.    09/04/16 1500  Clinical Encounter Type  Visited With Patient and family together  Visit Type Initial;Follow-up;Spiritual support  Referral From Chaplain  Consult/Referral To [place] (Comment)

## 2016-09-04 NOTE — Progress Notes (Signed)
[facility]  ROUNDING NOTE   Subjective:    States she is able to tolerate clears. No N/V reported Urine output staying satisfactory S Cr and BUN have improved today to 3.93/ bUN 83  Objective:  Vital signs in last 24 hours:  Temp:  [98.3 F (36.8 C)-98.7 F (37.1 C)] 98.7 F (37.1 C) (07/12 1213) Pulse Rate:  [83-90] 85 (07/12 1213) Resp:  [18-20] 18 (07/12 1213) BP: (127-154)/(49-84) 154/69 (07/12 1213) SpO2:  [90 %-97 %] 90 % (07/12 1213) Weight:  [94 kg (207 lb 4.8 oz)] 94 kg (207 lb 4.8 oz) (07/12 0417)  Weight change: -1.497 kg (-3 lb 4.8 oz) Filed Weights   09/02/16 0400 09/03/16 0335 09/04/16 0417  Weight: 95.9 kg (211 lb 6.7 oz) 95.5 kg (210 lb 9.6 oz) 94 kg (207 lb 4.8 oz)    Intake/Output: I/O last 3 completed shifts: In: 358 [P.O.:358] Out: 1325 [Urine:1325]   Intake/Output this shift:  Total I/O In: 240 [P.O.:240] Out: 300 [Urine:300]  Physical Exam: General: Ill appearing  Head: [place]/AT  Eyes: Anicteric,  Moist mucus membranes  Neck:  supple  Lungs:  Clear anteriorly and laterally, mild basilar crackles at bases  Heart: tachycardia  Abdomen:  Soft, nontender, obese  Extremities: + dependent edema  Neurologic: Alert, able to answer questions  Skin: Purplish lesions over toes and some fingers         Basic Metabolic Panel:  Recent Labs Lab 08/29/16 1607 08/29/16 2246 08/30/16 0343 08/31/16 0421  09/01/16 0606 09/02/16 0322 09/02/16 0845 09/03/16 0845 09/04/16 0011  NA 138 139 139 139  < > 138 141 TEST CANCELLED PER MD 140 141  K 4.2 3.9 4.5 4.3  < > 3.8 4.1 TEST CANCELLED PER MD 4.2 3.7  CL 105 107 106 107  < > 105 107 TEST CANCELLED PER MD 106 107  CO2 27 27 27 27   < > 24 25 TEST CANCELLED PER MD 24 25  GLUCOSE 155* 115* 139* 142*  < > 126* 114* TEST CANCELLED PER MD 133* 77  BUN 46* 41* 39* 56*  < > 76* 93* TEST CANCELLED PER MD 90* 83*  CREATININE 1.89* 1.69* 1.54* 2.46*  < > 3.56* 4.15* TEST CANCELLED PER MD 4.30*  3.93*  CALCIUM 8.3* 8.2* 8.5* 8.2*  < > 8.4* 8.3* TEST CANCELLED PER MD 8.2* 8.0*  MG 1.8 1.7 1.7 1.9  --  2.1  --   --   --   --   PHOS 2.9 2.6 3.0 4.1  --  5.4*  5.5* 7.2* TEST CANCELLED PER MD 7.2* 6.0*  < > = values in this interval not displayed.  Liver Function Tests:  Recent Labs Lab 08/31/16 0421 09/01/16 0606 09/02/16 0322 09/02/16 0845 09/03/16 0845 09/04/16 0011  AST 168*  --   --   --   --   --   ALT 73*  --   --   --   --   --   ALKPHOS 102  --   --   --   --   --   BILITOT 1.0  --   --   --   --   --   PROT 5.3*  --   --   --   --   --   ALBUMIN 2.2* 2.4* 2.5* TEST CANCELLED PER MD 2.4* 2.3*   No results for input(s): LIPASE, AMYLASE in the last 168 hours. No results for input(s): AMMONIA in the last 168  hours.  CBC:  Recent Labs Lab 08/29/16 0345  08/30/16 1201 08/31/16 0421 08/31/16 2144 09/01/16 0606 09/02/16 0323 09/04/16 0011  WBC 38.8*  < > 51.6* 46.9* 47.1* 41.1* 34.8* 18.1*  NEUTROABS 31.4*  --  44.9*  --   --   --   --   --   HGB 8.8*  < > 8.6* 8.2* 8.7* 8.8* 8.2* 7.9*  HCT 27.2*  < > 26.6* 24.5* 26.8* 26.1* 25.6* 23.9*  MCV 87.5  < > 86.3 87.1 85.1 86.8 86.7 85.3  PLT 27*  < > 68* 79* 93* 93* 107* 141*  < > = values in this interval not displayed.  Cardiac Enzymes: No results for input(s): CKTOTAL, CKMB, CKMBINDEX, TROPONINI in the last 168 hours.  BNP: Invalid input(s): POCBNP  CBG:  Recent Labs Lab 09/03/16 2127 09/04/16 0054 09/04/16 0415 09/04/16 0755 09/04/16 1217  GLUCAP 78 70 94 89 98    Microbiology: Results for orders placed or performed during the hospital encounter of 08/24/16  Blood Culture (routine x 2)     Status: Abnormal   Collection Time: 08/24/16  8:25 AM  Result Value Ref Range Status   Specimen Description BLOOD RIGHT ANTECUBITAL  Final   Special Requests   Final    BOTTLES DRAWN AEROBIC AND ANAEROBIC Blood Culture adequate volume   Culture  Setup Time   Final    GRAM NEGATIVE RODS IN BOTH AEROBIC  AND ANAEROBIC BOTTLES CRITICAL RESULT CALLED TO, READ BACK BY AND VERIFIED WITH: JASON ROBBINS ON 08/24/16 AT 1933 QSD    Culture (A)  Final    KLEBSIELLA PNEUMONIAE SUSCEPTIBILITIES PERFORMED ON PREVIOUS CULTURE WITHIN THE LAST 5 DAYS. Performed at [facility], [address], [place], [place] [address]    Report Status 08/27/2016 FINAL  Final  Blood Culture (routine x 2)     Status: Abnormal   Collection Time: 08/24/16  8:25 AM  Result Value Ref Range Status   Specimen Description BLOOD RIGHT ANTECUBITAL  Final   Special Requests   Final    BOTTLES DRAWN AEROBIC AND ANAEROBIC Blood Culture adequate volume   Culture  Setup Time   Final    GRAM NEGATIVE RODS IN BOTH AEROBIC AND ANAEROBIC BOTTLES CRITICAL RESULT CALLED TO, READ BACK BY AND VERIFIED WITH: JASON ROBBINS ON 08/24/16 AT 1933 QSD    Culture KLEBSIELLA PNEUMONIAE (A)  Final   Report Status 08/27/2016 FINAL  Final   Organism ID, Bacteria KLEBSIELLA PNEUMONIAE  Final      Susceptibility   Klebsiella pneumoniae - MIC*    AMPICILLIN >=32 RESISTANT Resistant     CEFAZOLIN <=4 SENSITIVE Sensitive     CEFEPIME <=1 SENSITIVE Sensitive     CEFTAZIDIME <=1 SENSITIVE Sensitive     CEFTRIAXONE <=1 SENSITIVE Sensitive     CIPROFLOXACIN <=0.25 SENSITIVE Sensitive     GENTAMICIN <=1 SENSITIVE Sensitive     IMIPENEM <=0.25 SENSITIVE Sensitive     TRIMETH/SULFA <=20 SENSITIVE Sensitive     AMPICILLIN/SULBACTAM 8 SENSITIVE Sensitive     PIP/TAZO <=4 SENSITIVE Sensitive     Extended ESBL NEGATIVE Sensitive     * KLEBSIELLA PNEUMONIAE  Blood Culture ID Panel (Reflexed)     Status: Abnormal   Collection Time: 08/24/16  8:25 AM  Result Value Ref Range Status   Enterococcus species NOT DETECTED NOT DETECTED Final   Listeria monocytogenes NOT DETECTED NOT DETECTED Final   Staphylococcus species NOT DETECTED NOT DETECTED Final   Staphylococcus aureus  NOT DETECTED NOT DETECTED Final   Streptococcus species NOT DETECTED NOT DETECTED  Final   Streptococcus agalactiae NOT DETECTED NOT DETECTED Final   Streptococcus pneumoniae NOT DETECTED NOT DETECTED Final   Streptococcus pyogenes NOT DETECTED NOT DETECTED Final   Acinetobacter baumannii NOT DETECTED NOT DETECTED Final   Enterobacteriaceae species DETECTED (A) NOT DETECTED Final    Comment: Enterobacteriaceae represent a large family of gram-negative bacteria, not a single organism. CRITICAL RESULT CALLED TO, READ BACK BY AND VERIFIED WITH: JASON ROBBINS ON 08/24/16 AT 1933 QSD    Enterobacter cloacae complex NOT DETECTED NOT DETECTED Final   Escherichia coli NOT DETECTED NOT DETECTED Final   Klebsiella oxytoca NOT DETECTED NOT DETECTED Final   Klebsiella pneumoniae DETECTED (A) NOT DETECTED Final    Comment: CRITICAL RESULT CALLED TO, READ BACK BY AND VERIFIED WITH: JASON ROBBINS ON 08/24/16 AT 1933 QSD    Proteus species NOT DETECTED NOT DETECTED Final   Serratia marcescens NOT DETECTED NOT DETECTED Final   Carbapenem resistance NOT DETECTED NOT DETECTED Final   Haemophilus influenzae NOT DETECTED NOT DETECTED Final   Neisseria meningitidis NOT DETECTED NOT DETECTED Final   Pseudomonas aeruginosa NOT DETECTED NOT DETECTED Final   Candida albicans NOT DETECTED NOT DETECTED Final   Candida glabrata NOT DETECTED NOT DETECTED Final   Candida krusei NOT DETECTED NOT DETECTED Final   Candida parapsilosis NOT DETECTED NOT DETECTED Final   Candida tropicalis NOT DETECTED NOT DETECTED Final  Urine culture     Status: Abnormal   Collection Time: 08/24/16  9:19 AM  Result Value Ref Range Status   Specimen Description URINE, CATHETERIZED  Final   Special Requests NONE  Final   Culture >=100,000 COLONIES/mL KLEBSIELLA PNEUMONIAE (A)  Final   Report Status 08/26/2016 FINAL  Final   Organism ID, Bacteria KLEBSIELLA PNEUMONIAE (A)  Final      Susceptibility   Klebsiella pneumoniae - MIC*    AMPICILLIN >=32 RESISTANT Resistant     CEFAZOLIN <=4 SENSITIVE Sensitive      CEFTRIAXONE <=1 SENSITIVE Sensitive     CIPROFLOXACIN <=0.25 SENSITIVE Sensitive     GENTAMICIN <=1 SENSITIVE Sensitive     IMIPENEM <=0.25 SENSITIVE Sensitive     NITROFURANTOIN 64 INTERMEDIATE Intermediate     TRIMETH/SULFA <=20 SENSITIVE Sensitive     AMPICILLIN/SULBACTAM 4 SENSITIVE Sensitive     PIP/TAZO <=4 SENSITIVE Sensitive     Extended ESBL NEGATIVE Sensitive     * >=100,000 COLONIES/mL KLEBSIELLA PNEUMONIAE  Body fluid culture     Status: None   Collection Time: 08/24/16  3:30 PM  Result Value Ref Range Status   Specimen Description FLUID  Final   Special Requests LEFT NEPHROSTOMY  Final   Gram Stain   Final    WBC PRESENT, PREDOMINANTLY PMN GRAM NEGATIVE RODS CYTOSPIN SMEAR Performed at [facility], [address], [place], [place] [address]    Culture FEW KLEBSIELLA PNEUMONIAE  Final   Report Status 08/28/2016 FINAL  Final   Organism ID, Bacteria KLEBSIELLA PNEUMONIAE  Final      Susceptibility   Klebsiella pneumoniae - MIC*    AMPICILLIN >=32 RESISTANT Resistant     CEFAZOLIN <=4 SENSITIVE Sensitive     CEFEPIME <=1 SENSITIVE Sensitive     CEFTAZIDIME <=1 SENSITIVE Sensitive     CEFTRIAXONE <=1 SENSITIVE Sensitive     CIPROFLOXACIN <=0.25 SENSITIVE Sensitive     GENTAMICIN <=1 SENSITIVE Sensitive     IMIPENEM <=0.25 SENSITIVE Sensitive  TRIMETH/SULFA <=20 SENSITIVE Sensitive     AMPICILLIN/SULBACTAM 8 SENSITIVE Sensitive     PIP/TAZO <=4 SENSITIVE Sensitive     Extended ESBL NEGATIVE Sensitive     * FEW KLEBSIELLA PNEUMONIAE  MRSA PCR Screening     Status: None   Collection Time: 08/24/16  3:44 PM  Result Value Ref Range Status   MRSA by PCR NEGATIVE NEGATIVE Final    Comment:        The GeneXpert MRSA Assay (FDA approved for NASAL specimens only), is one component of a comprehensive MRSA colonization surveillance program. It is not intended to diagnose MRSA infection nor to guide or monitor treatment for MRSA infections.   Culture,  respiratory (NON-Expectorated)     Status: None   Collection Time: 08/25/16  7:00 AM  Result Value Ref Range Status   Specimen Description TRACHEAL ASPIRATE  Final   Special Requests NONE  Final   Gram Stain   Final    FEW WBC PRESENT, PREDOMINANTLY PMN FEW SQUAMOUS EPITHELIAL CELLS PRESENT NO ORGANISMS SEEN    Culture   Final    Consistent with normal respiratory flora. Performed at [facility], [address], [place], [place] [address]    Report Status 08/27/2016 FINAL  Final  C difficile quick scan w PCR reflex     Status: None   Collection Time: 08/26/16  8:05 PM  Result Value Ref Range Status   C Diff antigen NEGATIVE NEGATIVE Final   C Diff toxin NEGATIVE NEGATIVE Final   C Diff interpretation No C. difficile detected.  Final  Culture, blood (Routine X 2) w Reflex to ID Panel     Status: None   Collection Time: 08/30/16  9:03 AM  Result Value Ref Range Status   Specimen Description BLOOD LEFT ARM  Final   Special Requests   Final    BOTTLES DRAWN AEROBIC AND ANAEROBIC Blood Culture adequate volume   Culture NO GROWTH 5 DAYS  Final   Report Status 09/04/2016 FINAL  Final  Culture, blood (Routine X 2) w Reflex to ID Panel     Status: None   Collection Time: 08/30/16 10:18 AM  Result Value Ref Range Status   Specimen Description BLOOD LEFT ARM  Final   Special Requests   Final    BOTTLES DRAWN AEROBIC AND ANAEROBIC Blood Culture adequate volume   Culture NO GROWTH 5 DAYS  Final   Report Status 09/04/2016 FINAL  Final  Urine Culture     Status: None   Collection Time: 08/30/16 12:01 PM  Result Value Ref Range Status   Specimen Description URINE, RANDOM  Final   Special Requests NONE  Final   Culture   Final    NO GROWTH Performed at [facility], [address] [place], [place] [address]    Report Status 08/31/2016 FINAL  Final    Coagulation Studies: No results for input(s): LABPROT, INR in the last 72 hours.  Urinalysis: No results for input(s):  COLORURINE, LABSPEC, PHURINE, GLUCOSEU, HGBUR, BILIRUBINUR, KETONESUR, PROTEINUR, UROBILINOGEN, NITRITE, LEUKOCYTESUR in the last 72 hours.  Invalid input(s): APPERANCEUR    Imaging: Dg Abd 1 View  Result Date: 09/03/2016 CLINICAL DATA:  Kidney stone, kidney infection EXAM: ABDOMEN - 1 VIEW COMPARISON:  CT abdomen/pelvis dated 08/30/2016 FINDINGS: Left percutaneous nephrostomy catheter. Associated 8 mm calculus overlying the left lower kidney. No evidence of bowel obstruction. Degenerative changes of the lumbar spine. Visualized bony pelvis appears intact. IMPRESSION: Left percutaneous nephrostomy  catheter. Associated 8 mm calculus overlying the left lower kidney. Electronically Signed   By: Charline BillsSriyesh  Krishnan M.D.   On: 09/03/2016 21:01     Medications:    . cephALEXin  250 mg Oral Q12H  . citalopram  20 mg Oral Daily  . feeding supplement (NEPRO CARB STEADY)  237 mL Oral TID BM  . heparin subcutaneous  5,000 Units Subcutaneous Q8H  . insulin aspart  0-9 Units Subcutaneous TID WC  . mouth rinse  15 mL Mouth Rinse q12n4p  . pantoprazole  40 mg Oral Daily  . sodium chloride flush  3 mL Intravenous Q12H   acetaminophen **OR** [DISCONTINUED] acetaminophen, albuterol, bisacodyl, hydrALAZINE, ondansetron **OR** ondansetron (ZOFRAN) IV, senna-docusate, sodium chloride flush  Assessment/ Plan:  Megan Nixon is a 66 y.o. black female with diabetes mellitus type II, hypertension, history of CVA, who was [facility] 08/24/2016 for septic shock  1. Acute renal failure with metabolic acidosis: baseline creatinine of 0.7 on 06/12/2016.  Acute renal failure secondary obstructive uropathy, sepsis, hypotension and ATN.  Left  hydrohephrosis- Percutaneous nephrostomy placed by VIR on 08/24/16.  - Urology follow up  UOP good S Cr has started to improve  2. Septic Shock: Klebsiella.  Afebrile. Leukocytosis    Klebsiella in urine  antibiotics as per primary team  3. Cirrhosis and  anasarca noted on CT and exam ? Etiology - now that blood pressure has improved, we'll start lasix  Will follow    LOS: 11 Wateen Varon 7/12/20181:04 PM

## 2016-09-04 NOTE — Care Management Important Message (Signed)
Important Message  Patient Details  Name: Megan FerdinandSarah S Fotopoulos MRN: [id number] Date of Birth: Aug 19, 1950   Medicare Important Message Given:  Yes Signed IM notice given  Eber HongGreene, Celinda Dethlefs R, RN 09/04/2016, 4:29 PM

## 2016-09-04 NOTE — Telephone Encounter (Signed)
-----   Message from Vanna ScotlandAshley Brandon, MD sent at 09/03/2016  8:29 PM EDT ----- Regarding: f/u Please arrange outpatient follow-up in 1-2 weeks.

## 2016-09-04 NOTE — Progress Notes (Signed)
[facility] [place] [facility]   PATIENT NAME: Megan Nixon    MR#:  [id number]  DATE OF BIRTH:  1950/04/12  SUBJECTIVE:  CRRT off since 08/31/16 Urine output 1010 cc Husband at bedside Answers basic questions well. Denies any pain. Working with PT  REVIEW OF SYSTEMS:    Review of Systems  Constitutional: Negative for chills, fever and weight loss.  HENT: Negative for ear discharge, ear pain and nosebleeds.   Eyes: Negative for blurred vision, pain and discharge.  Respiratory: Negative for sputum production, shortness of breath, wheezing and stridor.   Cardiovascular: Negative for chest pain, palpitations, orthopnea and PND.  Gastrointestinal: Negative for abdominal pain, diarrhea, nausea and vomiting.  Genitourinary: Negative for frequency and urgency.  Musculoskeletal: Negative for back pain and joint pain.  Neurological: Positive for weakness. Negative for sensory change, speech change and focal weakness.  Psychiatric/Behavioral: Negative for depression and hallucinations. The patient is not nervous/anxious.    DRUG ALLERGIES:  No Known Allergies  VITALS:  Blood pressure 131/64, pulse 85, temperature 98.6 F (37 C), temperature source Oral, resp. rate 18, height 5\' 8"  (1.727 m), weight 94 kg (207 lb 4.8 oz), SpO2 97 %.  PHYSICAL EXAMINATION:   Physical Exam  GENERAL:  66 y.o.-year-old patient lying in the bed.   EYES: Pupils equal, round, reactive to light HEENT: Head atraumatic, normocephalic. Oropharynx and nasopharynx clear.  NECK:  Supple, no jugular venous distention. No thyroid enlargement, no tenderness.  LUNGS: Bilateral decreased breath sounds bases. No use of accessory muscles. CARDIOVASCULAR: S1, S2 normal. Tachycardia, no murmur ABDOMEN: Soft, nontender, nondistended. Bowel sounds present. No organomegaly or mass.  EXTREMITIES: Patient does have cyanosis/ischemia bilateral lower extremity left more than right 3+ pitting edema both  lower extremities    Cerebrovascular system moves all extremities well. Generalized deconditioning. Nonfocal grossly. weak PSYCHIATRY: flat affect  LABORATORY PANEL:   CBC  Recent Labs Lab 09/04/16 0011  WBC 18.1*  HGB 7.9*  HCT 23.9*  PLT 141*   ------------------------------------------------------------------------------------------------------------------ Chemistries   Recent Labs Lab 08/31/16 0421  09/01/16 0606  09/04/16 0011  NA 139  < > 138  < > 141  K 4.3  < > 3.8  < > 3.7  CL 107  < > 105  < > 107  CO2 27  < > 24  < > 25  GLUCOSE 142*  < > 126*  < > 77  BUN 56*  < > 76*  < > 83*  CREATININE 2.46*  < > 3.56*  < > 3.93*  CALCIUM 8.2*  < > 8.4*  < > 8.0*  MG 1.9  --  2.1  --   --   AST 168*  --   --   --   --   ALT 73*  --   --   --   --   ALKPHOS 102  --   --   --   --   BILITOT 1.0  --   --   --   --   < > = values in this interval not displayed. ------------------------------------------------------------------------------------------------------------------  Cardiac Enzymes No results for input(s): TROPONINI in the last 168 hours. ------------------------------------------------------------------------------------------------------------------  RADIOLOGY:  Dg Abd 1 View  Result Date: 09/03/2016 CLINICAL DATA:  Kidney stone, kidney infection EXAM: ABDOMEN - 1 VIEW COMPARISON:  CT abdomen/pelvis dated 08/30/2016 FINDINGS: Left percutaneous nephrostomy catheter. Associated 8 mm calculus overlying the left lower kidney. No evidence of bowel obstruction. Degenerative changes of the  lumbar spine. Visualized bony pelvis appears intact. IMPRESSION: Left percutaneous nephrostomy catheter. Associated 8 mm calculus overlying the left lower kidney. Electronically Signed   By: Charline BillsSriyesh  Krishnan M.D.   On: 09/03/2016 21:01     ASSESSMENT AND PLAN:   * Septic shock Due to Klebsiella bacteremia source urine Patient intubated and now extubated -Off IV pressors -White  count up to 38,000---47,000--51,000---46K--41K---34K--33k - No fever -CT abdomen shows no abcess -C diff negative on 08/26/16, rectal tube removed.  * Left pyelonephritis with UPJ calculus and hydro-nephrosis Status post percutaneous nephrostomy tube placement on admission  -urine culture positive for Klebsiella -Patient was on IV Rocephin----was on IV meropenem and vancomycin---now on IV cefazolin---changed to oral keflex -ID consultation with dr Sampson GoonFitzgerald appreciated -CT abdomen did not show any any fluid collection/abscess. -pt will f/u with Dr Apolinar JunesBrandon for definitive treatment for her renal stones  * Acute kidney injury/pain unit secondary to septic shock ATN -On CRRT--- which is discontinued on 08/30/2016 -Dialysis catheter removed. Central line removed.  * ARDS with Acute hypoxic respiratory failure --Status post mechanical ventilator now extubated   *Severe DIC due to #1 -Patient receiving platelets and FFP's according to her labs -now resolved  *Bilateral lower extremity ischemia more on the left than right in the setting of sepsis/DIC  -Vascular recommendations noted. Patient has good peripheral pedal pulses. -no intervention needed  *PT recommends CIR. Patient has been evaluated. She'll benefit from inpatient rehabilitation. Awaiting insurance authorization. Patient is improving medically. Patient is ready for discharge to inpatient rehabilitation once insurance authorization approved  All the records are reviewed and case discussed with Care Management/Social Workerr. Management plans discussed with the patient, family and they are in agreement.  CODE STATUS: FULL CODE  DVT Prophylaxis: heparin  TOTAL TIME TAKING CARE OF THIS PATIENT: 25 minutes.    Lacorey Brusca M.D on 09/04/2016 at 10:30 AM  Between 7am to 6pm - Pager - [phone]  After 6pm go to [email or web address] - password [email or web address]  [facility]  Office  [phone]  CC: Primary care  physician; Danella PentonMiller, Mark F, MD  Note: This dictation was prepared with Dragon dictation along with smaller phrase technology. Any transcriptional errors that result from this process are unintentional.

## 2016-09-05 LAB — GLUCOSE, CAPILLARY
GLUCOSE-CAPILLARY: 149 mg/dL — AB (ref 65–99)
Glucose-Capillary: 104 mg/dL — ABNORMAL HIGH (ref 65–99)
Glucose-Capillary: 119 mg/dL — ABNORMAL HIGH (ref 65–99)
Glucose-Capillary: 146 mg/dL — ABNORMAL HIGH (ref 65–99)
Glucose-Capillary: 93 mg/dL (ref 65–99)

## 2016-09-05 MED ORDER — PANTOPRAZOLE SODIUM 40 MG PO TBEC
40.0000 mg | DELAYED_RELEASE_TABLET | Freq: Every day | ORAL | 0 refills | Status: AC
Start: 2016-09-05 — End: ?

## 2016-09-05 MED ORDER — CITALOPRAM HYDROBROMIDE 20 MG PO TABS: 20.0000 mg | ORAL_TABLET | Freq: Every day | ORAL | 2 refills | Status: DC

## 2016-09-05 MED ORDER — CEPHALEXIN 250 MG PO CAPS: 250.0000 mg | ORAL_CAPSULE | Freq: Two times a day (BID) | ORAL | 0 refills | Status: DC

## 2016-09-05 MED ORDER — VERAPAMIL HCL ER 180 MG PO TBCR
180.0000 mg | EXTENDED_RELEASE_TABLET | Freq: Every day | ORAL | Status: DC
  Administered 2016-09-05: 180 mg via ORAL
  Filled 2016-09-05: qty 1

## 2016-09-05 MED ORDER — INSULIN ASPART 100 UNIT/ML ~~LOC~~ SOLN: 0.0000 [IU] | Freq: Three times a day (TID) | SUBCUTANEOUS | 11 refills | Status: DC

## 2016-09-05 MED ORDER — FUROSEMIDE 40 MG PO TABS: 40.0000 mg | ORAL_TABLET | Freq: Every day | ORAL | 0 refills | Status: DC

## 2016-09-05 MED ORDER — NEPRO/CARBSTEADY PO LIQD: 237.0000 mL | Freq: Three times a day (TID) | ORAL | 0 refills | Status: DC

## 2016-09-05 MED ORDER — HYDRALAZINE HCL 25 MG PO TABS: 25.0000 mg | ORAL_TABLET | Freq: Three times a day (TID) | ORAL | Status: DC

## 2016-09-05 NOTE — Progress Notes (Signed)
A&O. [facility] Nephrostomy tube in place, draining well. Denies pain or nausea.

## 2016-09-05 NOTE — Clinical Social Work Note (Signed)
CSW received phone call from insurance company with authorization number [id number].    Patient to be d/c'ed today to [facility].  Patient and family agreeable to plans will transport via ems RN to call report to 700 hall nurse [phone] room [phone].   Windell MouldingEric Ezell Melikian, MSW, Theresia MajorsLCSWA [phone]

## 2016-09-05 NOTE — Progress Notes (Signed)
Inpatient Rehabilitation  Following Dr. Eliane DecreePatel's peer to peer review with [employer] [facility] [employer] and review of updated PT notes [employer] has decided to uphold the denial for IP Rehab.  I have called to notify patient's spouse and [facility] RNCM.  Patient will require other post acute therapy options.  Will sign off at this time.  Please call if there are questions.   Charlane FerrettiMelissa Talyssa Gibas, M.A., CCC/SLP Admission Coordinator  [facility]  Cell [phone]

## 2016-09-05 NOTE — Progress Notes (Signed)
[facility] CLINIC INFECTIOUS DISEASE PROGRESS NOTE Date of Admission:  08/24/2016     ID: Megan Nixon is a 66 y.o. female with  Klebsiella urosepsis Active Problems:   Septic shock (HCC)   Sepsis (HCC)   Acute renal failure (HCC)   Kidney stone on left side   Subjective: No fevers, wbc down to 18.   ROS  Eleven systems are reviewed and negative except per hpi  Medications:  Antibiotics Given (last 72 hours)    Date/Time Action Medication Dose Rate   09/02/16 1653 New Bag/Given   ceFAZolin (ANCEF) IVPB 1 g/50 mL premix 1 g 100 mL/hr   09/03/16 1020 New Bag/Given   ceFAZolin (ANCEF) IVPB 1 g/50 mL premix 1 g 100 mL/hr   09/03/16 2136 New Bag/Given   ceFAZolin (ANCEF) IVPB 1 g/50 mL premix 1 g 100 mL/hr   09/04/16 1231 Given   cephALEXin (KEFLEX) capsule 250 mg 250 mg    09/04/16 2158 Given   cephALEXin (KEFLEX) capsule 250 mg 250 mg    09/05/16 1029 Given   cephALEXin (KEFLEX) capsule 250 mg 250 mg      . cephALEXin  250 mg Oral Q12H  . citalopram  20 mg Oral Daily  . feeding supplement (NEPRO CARB STEADY)  237 mL Oral TID BM  . furosemide  40 mg Oral Daily  . heparin subcutaneous  5,000 Units Subcutaneous Q8H  . insulin aspart  0-9 Units Subcutaneous TID WC  . mouth rinse  15 mL Mouth Rinse q12n4p  . pantoprazole  40 mg Oral Daily  . sodium chloride flush  3 mL Intravenous Q12H  . verapamil  180 mg Oral Daily    Objective: Vital signs in last 24 hours: Temp:  [97.9 F (36.6 C)-98.6 F (37 C)] 98.6 F (37 C) (07/13 0917) Pulse Rate:  [79-88] 79 (07/13 0917) Resp:  [16-24] 24 (07/13 0917) BP: (133-154)/(62-75) 154/75 (07/13 0917) SpO2:  [92 %-97 %] 94 % (07/13 0917) Weight:  [91.3 kg (201 lb 4.8 oz)] 91.3 kg (201 lb 4.8 oz) (07/13 0447) Constitutional:  Awake, family at bedside, slowed mentation  HENT: [place]/AT, PERRLA, no scleral icterus Mouth/Throat: Oropharynx is clear and moist. No oropharyngeal exudate.  Cardiovascular: Normal rate, regular rhythm and  normal heart sounds. Pulmonary/Chest: Effort normal and breath sounds normal. No respiratory distress.  has no wheezes.  Neck = supple, no nuchal rigidity Abdominal: Soft. Bowel sounds are normal.  exhibits no distension. There is no tenderness.  Lymphadenopathy: no cervical adenopathy. No axillary adenopathy Neurological: alert and oriented to person, place, and time.  Skin:bil hands with areas of pink and purplish discoloration,  bil feet with areas of bluish discoloration, gangrenous changes of several toes. Blister on dorsum of several toes Psychiatric: a normal mood and affect.  behavior is normal.   Lab Results  Recent Labs  09/03/16 0845 09/04/16 0011  WBC  --  18.1*  HGB  --  7.9*  HCT  --  23.9*  NA 140 141  K 4.2 3.7  CL 106 107  CO2 24 25  BUN 90* 83*  CREATININE 4.30* 3.93*    Microbiology: Results for orders placed or performed during the hospital encounter of 08/24/16  Blood Culture (routine x 2)     Status: Abnormal   Collection Time: 08/24/16  8:25 AM  Result Value Ref Range Status   Specimen Description BLOOD RIGHT ANTECUBITAL  Final   Special Requests   Final    BOTTLES DRAWN AEROBIC AND  ANAEROBIC Blood Culture adequate volume   Culture  Setup Time   Final    GRAM NEGATIVE RODS IN BOTH AEROBIC AND ANAEROBIC BOTTLES CRITICAL RESULT CALLED TO, READ BACK BY AND VERIFIED WITH: JASON ROBBINS ON 08/24/16 AT 1933 QSD    Culture (A)  Final    KLEBSIELLA PNEUMONIAE SUSCEPTIBILITIES PERFORMED ON PREVIOUS CULTURE WITHIN THE LAST 5 DAYS. Performed at [facility], [address] [place], [place] [address]    Report Status 08/27/2016 FINAL  Final  Blood Culture (routine x 2)     Status: Abnormal   Collection Time: 08/24/16  8:25 AM  Result Value Ref Range Status   Specimen Description BLOOD RIGHT ANTECUBITAL  Final   Special Requests   Final    BOTTLES DRAWN AEROBIC AND ANAEROBIC Blood Culture adequate volume   Culture  Setup Time   Final    GRAM  NEGATIVE RODS IN BOTH AEROBIC AND ANAEROBIC BOTTLES CRITICAL RESULT CALLED TO, READ BACK BY AND VERIFIED WITH: JASON ROBBINS ON 08/24/16 AT 1933 QSD    Culture KLEBSIELLA PNEUMONIAE (A)  Final   Report Status 08/27/2016 FINAL  Final   Organism ID, Bacteria KLEBSIELLA PNEUMONIAE  Final      Susceptibility   Klebsiella pneumoniae - MIC*    AMPICILLIN >=32 RESISTANT Resistant     CEFAZOLIN <=4 SENSITIVE Sensitive     CEFEPIME <=1 SENSITIVE Sensitive     CEFTAZIDIME <=1 SENSITIVE Sensitive     CEFTRIAXONE <=1 SENSITIVE Sensitive     CIPROFLOXACIN <=0.25 SENSITIVE Sensitive     GENTAMICIN <=1 SENSITIVE Sensitive     IMIPENEM <=0.25 SENSITIVE Sensitive     TRIMETH/SULFA <=20 SENSITIVE Sensitive     AMPICILLIN/SULBACTAM 8 SENSITIVE Sensitive     PIP/TAZO <=4 SENSITIVE Sensitive     Extended ESBL NEGATIVE Sensitive     * KLEBSIELLA PNEUMONIAE  Blood Culture ID Panel (Reflexed)     Status: Abnormal   Collection Time: 08/24/16  8:25 AM  Result Value Ref Range Status   Enterococcus species NOT DETECTED NOT DETECTED Final   Listeria monocytogenes NOT DETECTED NOT DETECTED Final   Staphylococcus species NOT DETECTED NOT DETECTED Final   Staphylococcus aureus NOT DETECTED NOT DETECTED Final   Streptococcus species NOT DETECTED NOT DETECTED Final   Streptococcus agalactiae NOT DETECTED NOT DETECTED Final   Streptococcus pneumoniae NOT DETECTED NOT DETECTED Final   Streptococcus pyogenes NOT DETECTED NOT DETECTED Final   Acinetobacter baumannii NOT DETECTED NOT DETECTED Final   Enterobacteriaceae species DETECTED (A) NOT DETECTED Final    Comment: Enterobacteriaceae represent a large family of gram-negative bacteria, not a single organism. CRITICAL RESULT CALLED TO, READ BACK BY AND VERIFIED WITH: JASON ROBBINS ON 08/24/16 AT 1933 QSD    Enterobacter cloacae complex NOT DETECTED NOT DETECTED Final   Escherichia coli NOT DETECTED NOT DETECTED Final   Klebsiella oxytoca NOT DETECTED NOT DETECTED  Final   Klebsiella pneumoniae DETECTED (A) NOT DETECTED Final    Comment: CRITICAL RESULT CALLED TO, READ BACK BY AND VERIFIED WITH: JASON ROBBINS ON 08/24/16 AT 1933 QSD    Proteus species NOT DETECTED NOT DETECTED Final   Serratia marcescens NOT DETECTED NOT DETECTED Final   Carbapenem resistance NOT DETECTED NOT DETECTED Final   Haemophilus influenzae NOT DETECTED NOT DETECTED Final   Neisseria meningitidis NOT DETECTED NOT DETECTED Final   Pseudomonas aeruginosa NOT DETECTED NOT DETECTED Final   Candida albicans NOT DETECTED NOT DETECTED Final   Candida glabrata NOT DETECTED NOT  DETECTED Final   Candida krusei NOT DETECTED NOT DETECTED Final   Candida parapsilosis NOT DETECTED NOT DETECTED Final   Candida tropicalis NOT DETECTED NOT DETECTED Final  Urine culture     Status: Abnormal   Collection Time: 08/24/16  9:19 AM  Result Value Ref Range Status   Specimen Description URINE, CATHETERIZED  Final   Special Requests NONE  Final   Culture >=100,000 COLONIES/mL KLEBSIELLA PNEUMONIAE (A)  Final   Report Status 08/26/2016 FINAL  Final   Organism ID, Bacteria KLEBSIELLA PNEUMONIAE (A)  Final      Susceptibility   Klebsiella pneumoniae - MIC*    AMPICILLIN >=32 RESISTANT Resistant     CEFAZOLIN <=4 SENSITIVE Sensitive     CEFTRIAXONE <=1 SENSITIVE Sensitive     CIPROFLOXACIN <=0.25 SENSITIVE Sensitive     GENTAMICIN <=1 SENSITIVE Sensitive     IMIPENEM <=0.25 SENSITIVE Sensitive     NITROFURANTOIN 64 INTERMEDIATE Intermediate     TRIMETH/SULFA <=20 SENSITIVE Sensitive     AMPICILLIN/SULBACTAM 4 SENSITIVE Sensitive     PIP/TAZO <=4 SENSITIVE Sensitive     Extended ESBL NEGATIVE Sensitive     * >=100,000 COLONIES/mL KLEBSIELLA PNEUMONIAE  Body fluid culture     Status: None   Collection Time: 08/24/16  3:30 PM  Result Value Ref Range Status   Specimen Description FLUID  Final   Special Requests LEFT NEPHROSTOMY  Final   Gram Stain   Final    WBC PRESENT, PREDOMINANTLY PMN GRAM  NEGATIVE RODS CYTOSPIN SMEAR Performed at [facility], [address], [place], [place] [address]    Culture FEW KLEBSIELLA PNEUMONIAE  Final   Report Status 08/28/2016 FINAL  Final   Organism ID, Bacteria KLEBSIELLA PNEUMONIAE  Final      Susceptibility   Klebsiella pneumoniae - MIC*    AMPICILLIN >=32 RESISTANT Resistant     CEFAZOLIN <=4 SENSITIVE Sensitive     CEFEPIME <=1 SENSITIVE Sensitive     CEFTAZIDIME <=1 SENSITIVE Sensitive     CEFTRIAXONE <=1 SENSITIVE Sensitive     CIPROFLOXACIN <=0.25 SENSITIVE Sensitive     GENTAMICIN <=1 SENSITIVE Sensitive     IMIPENEM <=0.25 SENSITIVE Sensitive     TRIMETH/SULFA <=20 SENSITIVE Sensitive     AMPICILLIN/SULBACTAM 8 SENSITIVE Sensitive     PIP/TAZO <=4 SENSITIVE Sensitive     Extended ESBL NEGATIVE Sensitive     * FEW KLEBSIELLA PNEUMONIAE  MRSA PCR Screening     Status: None   Collection Time: 08/24/16  3:44 PM  Result Value Ref Range Status   MRSA by PCR NEGATIVE NEGATIVE Final    Comment:        The GeneXpert MRSA Assay (FDA approved for NASAL specimens only), is one component of a comprehensive MRSA colonization surveillance program. It is not intended to diagnose MRSA infection nor to guide or monitor treatment for MRSA infections.   Culture, respiratory (NON-Expectorated)     Status: None   Collection Time: 08/25/16  7:00 AM  Result Value Ref Range Status   Specimen Description TRACHEAL ASPIRATE  Final   Special Requests NONE  Final   Gram Stain   Final    FEW WBC PRESENT, PREDOMINANTLY PMN FEW SQUAMOUS EPITHELIAL CELLS PRESENT NO ORGANISMS SEEN    Culture   Final    Consistent with normal respiratory flora. Performed at [facility], [address], [place], [place] [address]    Report Status 08/27/2016 FINAL  Final  C difficile quick  scan w PCR reflex     Status: None   Collection Time: 08/26/16  8:05 PM  Result Value Ref Range Status   C Diff antigen NEGATIVE NEGATIVE Final   C Diff toxin  NEGATIVE NEGATIVE Final   C Diff interpretation No C. difficile detected.  Final  Culture, blood (Routine X 2) w Reflex to ID Panel     Status: None   Collection Time: 08/30/16  9:03 AM  Result Value Ref Range Status   Specimen Description BLOOD LEFT ARM  Final   Special Requests   Final    BOTTLES DRAWN AEROBIC AND ANAEROBIC Blood Culture adequate volume   Culture NO GROWTH 5 DAYS  Final   Report Status 09/04/2016 FINAL  Final  Culture, blood (Routine X 2) w Reflex to ID Panel     Status: None   Collection Time: 08/30/16 10:18 AM  Result Value Ref Range Status   Specimen Description BLOOD LEFT ARM  Final   Special Requests   Final    BOTTLES DRAWN AEROBIC AND ANAEROBIC Blood Culture adequate volume   Culture NO GROWTH 5 DAYS  Final   Report Status 09/04/2016 FINAL  Final  Urine Culture     Status: None   Collection Time: 08/30/16 12:01 PM  Result Value Ref Range Status   Specimen Description URINE, RANDOM  Final   Special Requests NONE  Final   Culture   Final    NO GROWTH Performed at [facility], [address], [place], [place] [address]    Report Status 08/31/2016 FINAL  Final    Studies/Results: Dg Abd 1 View  Result Date: 09/03/2016 CLINICAL DATA:  Kidney stone, kidney infection EXAM: ABDOMEN - 1 VIEW COMPARISON:  CT abdomen/pelvis dated 08/30/2016 FINDINGS: Left percutaneous nephrostomy catheter. Associated 8 mm calculus overlying the left lower kidney. No evidence of bowel obstruction. Degenerative changes of the lumbar spine. Visualized bony pelvis appears intact. IMPRESSION: Left percutaneous nephrostomy catheter. Associated 8 mm calculus overlying the left lower kidney. Electronically Signed   By: Charline Bills M.D.   On: 09/03/2016 21:01    Assessment/Plan: Megan Nixon is a 66 y.o. female admitted with urosepsis from L obstructing nephrolithiasis with ucx, bcx + Klebsiella, now s/p L nephrostomy, improving following period of intubation, pressor  requirement and CRRT. She has had removal of all central lines, rectal tube but remains with Elevated wbc up to 51, now trending down. She has gangrenous changes of bil feet and hands. Has seen vascular. Fu bcx and ucx negative.  I suspect her leukocytosis which is out of proportion to her clinical pictures is related to her gangrenous changes. She had recent repeat CT with no evidence of abscess. Has had nephrostomy tube in place. Day 13of abx Hand appears to be improving. Toes gangrenous and with some blistering.  Recommendations Continue ancef while inpatient but can change to oral keflex when stable - would rec 14 day course - stop date 7/15 Monitor wbc every other day. If increasing wound reevaluate lesions on feet and consider if surgery needed.  Monitor LE and hands for progression to gangrene. Elevate legs to control edema Thank you very much for the consult. Will follow with you.  Maleta Pacha P   09/05/2016, 3:27 PM

## 2016-09-05 NOTE — Progress Notes (Signed)
Being discharged to [facility] for initial rehab and reconditioning.  Family would like her to be transferred to inpt rehab when she is able to handle the additional services. Report call ed to [facility].  AVS sent.  Copy of AVS also reviewed with family.  They have their own copy.

## 2016-09-05 NOTE — Discharge Summary (Addendum)
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 Diabetes mellitus without complication (HCC)   . Hypertension   . Stroke [facility])     HOSPITAL COURSE:  * Septic shock Due to Klebsiella bacteremia source urine Patient intubated and now extubated -Off IV pressors -White count up to 38,000---47,000--51,000---46K--41K---34K--33k - No fever -CT abdomen shows no abcess -C diff negative on 08/26/16, rectal tube removed.  * Left pyelonephritis with UPJ calculus and hydro-nephrosis Status post percutaneous nephrostomy tube placement on admission  -urine culture positive for Klebsiella -Patient was on IV Rocephin----was on IV meropenem and vancomycin---now on IV cefazolin---changed to oral keflex -ID consultation with dr Sampson Goon appreciated -CT abdomen did not show any any fluid collection/abscess. -pt will f/u with Dr Apolinar Junes for definitive treatment for her renal/ureteral stones  * Acute on Chronic kidney injury secondary to septic shock ATN---now has CKD IV -On CRRT--- which is discontinued on 08/30/2016 -Dialysis catheter removed. Central line removed.  * ARDS with Acute hypoxic respiratory failure --Status post mechanical ventilator now extubated  -pt on RA  *HTN resume verapamil  *Dm-2  Sugars doing  well. Pt only on SSI I will not resume glimipride or metformin at present  *Severe DIC due to #1 -Patient received platelets and FFP's during her stay -now resolved  *Bilateral lower extremity ischemia more on the left than right in the setting of sepsis/DIC  -Vascular recommendations noted. Patient has good peripheral pedal pulses. -no intervention needed  *Critical illness myopathy /gen deconditioning PT recommends CIR. Patient has been evaluated. She'll benefit from inpatient rehabilitation. I personally did appear to review regarding her CIR authorization from insurance. This was declined. Discussed with patient and patient's husband. Asked if they want to re-appeal  on the case. Husband denied. We'll get her to go to rehabilitation today. CONSULTS OBTAINED:  Treatment Team:  Dorothyann Gibbs, MD Lamont Dowdy, MD Schnier, Latina Craver, MD Mick Sell, MD  DRUG ALLERGIES:  No Known Allergies  DISCHARGE MEDICATIONS:   Current Discharge Medication List    START taking these medications   Details  cephALEXin (KEFLEX) 250 MG capsule Take 1 capsule (250 mg total) by mouth every 12 (twelve) hours. Qty: 6 capsule, Refills: 0    citalopram (CELEXA) 20 MG tablet Take 1 tablet (20 mg total) by mouth daily. Qty: 30 tablet, Refills: 2    furosemide (LASIX) 40 MG tablet Take 1 tablet (40 mg total) by mouth daily. Qty: 30 tablet, Refills: 0    insulin aspart (NOVOLOG) 100 UNIT/ML injection Inject 0-9 Units into the skin 3 (three) times daily with meals. Qty: 10 mL, Refills: 11    pantoprazole (PROTONIX) 40 MG tablet Take 1 tablet (40 mg total) by mouth daily. Qty: 40 tablet, Refills: 0      CONTINUE these medications which have NOT CHANGED   Details  meloxicam (MOBIC) 15 MG  tablet Take 15 mg by mouth daily.    verapamil (CALAN-SR) 180 MG CR tablet 180 mg daily.       STOP taking these medications     glimepiride (AMARYL) 1 MG tablet       irbesartan-hydrochlorothiazide (AVALIDE) 300-12.5 MG tablet      metFORMIN (GLUCOPHAGE) 500 MG tablet         If you experience worsening of your admission symptoms, develop shortness of breath, life threatening emergency, suicidal or homicidal thoughts you must seek medical attention immediately by calling 911 or calling your MD immediately  if symptoms less severe.  You Must read complete instructions/literature along with all the possible adverse reactions/side effects for all the Medicines you take and that have been prescribed to you. Take any new Medicines after you have completely understood and accept all the possible adverse reactions/side effects.   Please note  You were cared for by a hospitalist during your hospital stay. If you have any questions about your discharge medications or the care you received while you were in the hospital after you are discharged, you can call the unit and asked to speak with the hospitalist on call if the hospitalist that took care of you is not available. Once you are discharged, your primary care physician will handle any further medical issues. Please note that NO REFILLS for any discharge medications will be authorized once you are discharged, as it is imperative that you return to your primary care physician (or establish a relationship with a primary care physician if you do not have one) for your aftercare needs so that they can reassess your need for medications and monitor your lab values. Today   SUBJECTIVE   Feel ok Weak. Working with PT  VITAL SIGNS:  Blood pressure (!) 154/75, pulse 79, temperature 98.6 F (37 C), temperature source Oral, resp. rate (!) 24, height 5\' 8"  (1.727 m), weight 91.3 kg (201 lb 4.8 oz), SpO2 94 %.  I/O:    Intake/Output Summary (Last 24 hours) at 09/05/16 0958 Last data filed at 09/05/16 0940  Gross per 24 hour  Intake              720 ml  Output             2125 ml  Net            -1405 ml     PHYSICAL EXAMINATION:  GENERAL:  66 y.o.-year-old patient lying in the bed with no acute distress.  EYES: Pupils equal, round, reactive to light and accommodation. No scleral icterus. Extraocular muscles intact.  HEENT: Head atraumatic, normocephalic. Oropharynx and nasopharynx clear.  NECK:  Supple, no jugular venous distention. No thyroid enlargement, no tenderness.  LUNGS: Normal breath sounds bilaterally, no wheezing, rales,rhonchi or crepitation. No use of accessory muscles of respiration.  CARDIOVASCULAR: S1, S2 normal. No murmurs, rubs, or gallops.  ABDOMEN: Soft, non-tender, non-distended. Bowel sounds present. No organomegaly or mass. Left nephrostomy tube EXTREMITIES:+ pedal edema, cyanosis, or clubbing.  NEUROLOGIC: Cranial nerves II through XII are intact. Muscle strength 4/5 in all extremities. Sensation intact. Gait not checked. Pt is weak overall PSYCHIATRIC: The patient is alert and oriented x 3.  SKIN: No obvious rash, lesion, or ulcer.   DATA REVIEW:   CBC   Recent Labs Lab 09/04/16 0011  WBC 18.1*  HGB 7.9*  HCT 23.9*  PLT 141*    Chemistries   Recent Labs Lab 08/31/16 0421  09/01/16 0606  09/04/16  0011  NA 139  < > 138  < > 141  K 4.3  < > 3.8  < > 3.7  CL 107  < > 105  < > 107  CO2 27  < > 24  < > 25  GLUCOSE 142*  < > 126*  < > 77  BUN 56*  < > 76*  < > 83*  CREATININE 2.46*  < > 3.56*  < > 3.93*  CALCIUM 8.2*  < > 8.4*  < > 8.0*  MG 1.9  --  2.1  --   --   AST 168*  --   --   --   --   ALT 73*  --   --   --   --   ALKPHOS 102  --   --   --   --   BILITOT 1.0  --   --   --   --   < > = values in this interval not displayed.  Microbiology Results   Recent Results (from the past 240 hour(s))  C difficile quick scan w PCR reflex     Status: None   Collection Time: 08/26/16  8:05 PM  Result Value Ref Range Status   C Diff antigen NEGATIVE NEGATIVE Final   C Diff toxin NEGATIVE NEGATIVE Final   C Diff interpretation No C. difficile  detected.  Final  Culture, blood (Routine X 2) w Reflex to ID Panel     Status: None   Collection Time: 08/30/16  9:03 AM  Result Value Ref Range Status   Specimen Description BLOOD LEFT ARM  Final   Special Requests   Final    BOTTLES DRAWN AEROBIC AND ANAEROBIC Blood Culture adequate volume   Culture NO GROWTH 5 DAYS  Final   Report Status 09/04/2016 FINAL  Final  Culture, blood (Routine X 2) w Reflex to ID Panel     Status: None   Collection Time: 08/30/16 10:18 AM  Result Value Ref Range Status   Specimen Description BLOOD LEFT ARM  Final   Special Requests   Final    BOTTLES DRAWN AEROBIC AND ANAEROBIC Blood Culture adequate volume   Culture NO GROWTH 5 DAYS  Final   Report Status 09/04/2016 FINAL  Final  Urine Culture     Status: None   Collection Time: 08/30/16 12:01 PM  Result Value Ref Range Status   Specimen Description URINE, RANDOM  Final   Special Requests NONE  Final   Culture   Final    NO GROWTH Performed at [facility], [address] [place], [place] [address]    Report Status 08/31/2016 FINAL  Final    RADIOLOGY:  Dg Abd 1 View  Result Date: 09/03/2016 CLINICAL DATA:  Kidney stone, kidney infection EXAM: ABDOMEN - 1 VIEW COMPARISON:  CT abdomen/pelvis dated 08/30/2016 FINDINGS: Left percutaneous nephrostomy catheter. Associated 8 mm calculus overlying the left lower kidney. No evidence of bowel obstruction. Degenerative changes of the lumbar spine. Visualized bony pelvis appears intact. IMPRESSION: Left percutaneous nephrostomy catheter. Associated 8 mm calculus overlying the left lower kidney. Electronically Signed   By: Charline BillsSriyesh  Krishnan M.D.   On: 09/03/2016 21:01     Management plans discussed with the patient, family and they are in agreement.  CODE STATUS:     Code Status Orders        Start     Ordered   08/24/16 1544  Full code  Continuous  08/24/16 1543    Code Status History    Date Active Date Inactive Code Status Order ID  Comments User Context   This patient has a current code status but no historical code status.      TOTAL TIME TAKING CARE OF THIS PATIENT: *49* minutes.    Kyri Dai M.D on 09/05/2016 at 9:58 AM  Between 7am to 6pm - Pager - [phone] After 6pm go to [email or web address] - Social research officer, government  [facility]  Office  [phone]  CC: Primary care physician; Danella Penton, MD

## 2016-09-05 NOTE — Plan of Care (Signed)
Problem: Health Behavior/Discharge Planning: Goal: Ability to manage health-related needs will improve Outcome: Completed/Met Date Met: 09/05/16 Discharge to [facility], discharge instruction with family and copy of [facility]S to [facility].   

## 2016-09-05 NOTE — Progress Notes (Signed)
[facility]  ROUNDING NOTE   Subjective:   Urine output staying satisfactory S Cr and BUN have improved today to 3.93/ bUN 83 No new labs today Patient denies any acute complaints. Yesterday, we started diuretics.  Patient reports increased urine output  Objective:  Vital signs in last 24 hours:  Temp:  [97.9 F (36.6 C)-98.6 F (37 C)] 98.6 F (37 C) (07/13 0917) Pulse Rate:  [79-88] 79 (07/13 0917) Resp:  [16-24] 24 (07/13 0917) BP: (133-154)/(62-75) 154/75 (07/13 0917) SpO2:  [92 %-97 %] 94 % (07/13 0917) Weight:  [91.3 kg (201 lb 4.8 oz)] 91.3 kg (201 lb 4.8 oz) (07/13 0447)  Weight change: -2.722 kg (-6 lb) Filed Weights   09/03/16 0335 09/04/16 0417 09/05/16 0447  Weight: 95.5 kg (210 lb 9.6 oz) 94 kg (207 lb 4.8 oz) 91.3 kg (201 lb 4.8 oz)    Intake/Output: I/O last 3 completed shifts: In: 720 [P.O.:720] Out: 2350 [Urine:2350]   Intake/Output this shift:  Total I/O In: 240 [P.O.:240] Out: 1375 [Urine:1375]  Physical Exam: General: Ill appearing  Head: [place]/AT  Eyes: Anicteric,  Moist mucus membranes  Neck:  supple  Lungs:  Clear anteriorly and laterally, mild basilar crackles at bases  Heart: tachycardia  Abdomen:  Soft, nontender, obese  Extremities: + dependent edema  Neurologic: Alert, able to answer questions  Skin: Purplish lesions over toes and some fingers         Basic Metabolic Panel:  Recent Labs Lab 08/29/16 1607 08/29/16 2246 08/30/16 0343 08/31/16 0421  09/01/16 0606 09/02/16 0322 09/02/16 0845 09/03/16 0845 09/04/16 0011  NA 138 139 139 139  < > 138 141 TEST CANCELLED PER MD 140 141  K 4.2 3.9 4.5 4.3  < > 3.8 4.1 TEST CANCELLED PER MD 4.2 3.7  CL 105 107 106 107  < > 105 107 TEST CANCELLED PER MD 106 107  CO2 27 27 27 27   < > 24 25 TEST CANCELLED PER MD 24 25  GLUCOSE 155* 115* 139* 142*  < > 126* 114* TEST CANCELLED PER MD 133* 77  BUN 46* 41* 39* 56*  < > 76* 93* TEST CANCELLED PER MD 90* 83*  CREATININE 1.89*  1.69* 1.54* 2.46*  < > 3.56* 4.15* TEST CANCELLED PER MD 4.30* 3.93*  CALCIUM 8.3* 8.2* 8.5* 8.2*  < > 8.4* 8.3* TEST CANCELLED PER MD 8.2* 8.0*  MG 1.8 1.7 1.7 1.9  --  2.1  --   --   --   --   PHOS 2.9 2.6 3.0 4.1  --  5.4*  5.5* 7.2* TEST CANCELLED PER MD 7.2* 6.0*  < > = values in this interval not displayed.  Liver Function Tests:  Recent Labs Lab 08/31/16 0421 09/01/16 0606 09/02/16 0322 09/02/16 0845 09/03/16 0845 09/04/16 0011  AST 168*  --   --   --   --   --   ALT 73*  --   --   --   --   --   ALKPHOS 102  --   --   --   --   --   BILITOT 1.0  --   --   --   --   --   PROT 5.3*  --   --   --   --   --   ALBUMIN 2.2* 2.4* 2.5* TEST CANCELLED PER MD 2.4* 2.3*   No results for input(s): LIPASE, AMYLASE in the last 168 hours. No results for  input(s): AMMONIA in the last 168 hours.  CBC:  Recent Labs Lab 08/30/16 1201 08/31/16 0421 08/31/16 2144 09/01/16 0606 09/02/16 0323 09/04/16 0011  WBC 51.6* 46.9* 47.1* 41.1* 34.8* 18.1*  NEUTROABS 44.9*  --   --   --   --   --   HGB 8.6* 8.2* 8.7* 8.8* 8.2* 7.9*  HCT 26.6* 24.5* 26.8* 26.1* 25.6* 23.9*  MCV 86.3 87.1 85.1 86.8 86.7 85.3  PLT 68* 79* 93* 93* 107* 141*    Cardiac Enzymes: No results for input(s): CKTOTAL, CKMB, CKMBINDEX, TROPONINI in the last 168 hours.  BNP: Invalid input(s): POCBNP  CBG:  Recent Labs Lab 09/04/16 2058 09/05/16 0009 09/05/16 0441 09/05/16 0804 09/05/16 1152  GLUCAP 114* 93 104* 149* 119*    Microbiology: Results for orders placed or performed during the hospital encounter of 08/24/16  Blood Culture (routine x 2)     Status: Abnormal   Collection Time: 08/24/16  8:25 AM  Result Value Ref Range Status   Specimen Description BLOOD RIGHT ANTECUBITAL  Final   Special Requests   Final    BOTTLES DRAWN AEROBIC AND ANAEROBIC Blood Culture adequate volume   Culture  Setup Time   Final    GRAM NEGATIVE RODS IN BOTH AEROBIC AND ANAEROBIC BOTTLES CRITICAL RESULT CALLED TO,  READ BACK BY AND VERIFIED WITH: JASON ROBBINS ON 08/24/16 AT 1933 QSD    Culture (A)  Final    KLEBSIELLA PNEUMONIAE SUSCEPTIBILITIES PERFORMED ON PREVIOUS CULTURE WITHIN THE LAST 5 DAYS. Performed at [facility], [address], [place], [place] [address]    Report Status 08/27/2016 FINAL  Final  Blood Culture (routine x 2)     Status: Abnormal   Collection Time: 08/24/16  8:25 AM  Result Value Ref Range Status   Specimen Description BLOOD RIGHT ANTECUBITAL  Final   Special Requests   Final    BOTTLES DRAWN AEROBIC AND ANAEROBIC Blood Culture adequate volume   Culture  Setup Time   Final    GRAM NEGATIVE RODS IN BOTH AEROBIC AND ANAEROBIC BOTTLES CRITICAL RESULT CALLED TO, READ BACK BY AND VERIFIED WITH: JASON ROBBINS ON 08/24/16 AT 1933 QSD    Culture KLEBSIELLA PNEUMONIAE (A)  Final   Report Status 08/27/2016 FINAL  Final   Organism ID, Bacteria KLEBSIELLA PNEUMONIAE  Final      Susceptibility   Klebsiella pneumoniae - MIC*    AMPICILLIN >=32 RESISTANT Resistant     CEFAZOLIN <=4 SENSITIVE Sensitive     CEFEPIME <=1 SENSITIVE Sensitive     CEFTAZIDIME <=1 SENSITIVE Sensitive     CEFTRIAXONE <=1 SENSITIVE Sensitive     CIPROFLOXACIN <=0.25 SENSITIVE Sensitive     GENTAMICIN <=1 SENSITIVE Sensitive     IMIPENEM <=0.25 SENSITIVE Sensitive     TRIMETH/SULFA <=20 SENSITIVE Sensitive     AMPICILLIN/SULBACTAM 8 SENSITIVE Sensitive     PIP/TAZO <=4 SENSITIVE Sensitive     Extended ESBL NEGATIVE Sensitive     * KLEBSIELLA PNEUMONIAE  Blood Culture ID Panel (Reflexed)     Status: Abnormal   Collection Time: 08/24/16  8:25 AM  Result Value Ref Range Status   Enterococcus species NOT DETECTED NOT DETECTED Final   Listeria monocytogenes NOT DETECTED NOT DETECTED Final   Staphylococcus species NOT DETECTED NOT DETECTED Final   Staphylococcus aureus NOT DETECTED NOT DETECTED Final   Streptococcus species NOT DETECTED NOT DETECTED Final   Streptococcus agalactiae NOT DETECTED NOT  DETECTED Final   Streptococcus pneumoniae NOT  DETECTED NOT DETECTED Final   Streptococcus pyogenes NOT DETECTED NOT DETECTED Final   Acinetobacter baumannii NOT DETECTED NOT DETECTED Final   Enterobacteriaceae species DETECTED (A) NOT DETECTED Final    Comment: Enterobacteriaceae represent a large family of gram-negative bacteria, not a single organism. CRITICAL RESULT CALLED TO, READ BACK BY AND VERIFIED WITH: JASON ROBBINS ON 08/24/16 AT 1933 QSD    Enterobacter cloacae complex NOT DETECTED NOT DETECTED Final   Escherichia coli NOT DETECTED NOT DETECTED Final   Klebsiella oxytoca NOT DETECTED NOT DETECTED Final   Klebsiella pneumoniae DETECTED (A) NOT DETECTED Final    Comment: CRITICAL RESULT CALLED TO, READ BACK BY AND VERIFIED WITH: JASON ROBBINS ON 08/24/16 AT 1933 QSD    Proteus species NOT DETECTED NOT DETECTED Final   Serratia marcescens NOT DETECTED NOT DETECTED Final   Carbapenem resistance NOT DETECTED NOT DETECTED Final   Haemophilus influenzae NOT DETECTED NOT DETECTED Final   Neisseria meningitidis NOT DETECTED NOT DETECTED Final   Pseudomonas aeruginosa NOT DETECTED NOT DETECTED Final   Candida albicans NOT DETECTED NOT DETECTED Final   Candida glabrata NOT DETECTED NOT DETECTED Final   Candida krusei NOT DETECTED NOT DETECTED Final   Candida parapsilosis NOT DETECTED NOT DETECTED Final   Candida tropicalis NOT DETECTED NOT DETECTED Final  Urine culture     Status: Abnormal   Collection Time: 08/24/16  9:19 AM  Result Value Ref Range Status   Specimen Description URINE, CATHETERIZED  Final   Special Requests NONE  Final   Culture >=100,000 COLONIES/mL KLEBSIELLA PNEUMONIAE (A)  Final   Report Status 08/26/2016 FINAL  Final   Organism ID, Bacteria KLEBSIELLA PNEUMONIAE (A)  Final      Susceptibility   Klebsiella pneumoniae - MIC*    AMPICILLIN >=32 RESISTANT Resistant     CEFAZOLIN <=4 SENSITIVE Sensitive     CEFTRIAXONE <=1 SENSITIVE Sensitive     CIPROFLOXACIN  <=0.25 SENSITIVE Sensitive     GENTAMICIN <=1 SENSITIVE Sensitive     IMIPENEM <=0.25 SENSITIVE Sensitive     NITROFURANTOIN 64 INTERMEDIATE Intermediate     TRIMETH/SULFA <=20 SENSITIVE Sensitive     AMPICILLIN/SULBACTAM 4 SENSITIVE Sensitive     PIP/TAZO <=4 SENSITIVE Sensitive     Extended ESBL NEGATIVE Sensitive     * >=100,000 COLONIES/mL KLEBSIELLA PNEUMONIAE  Body fluid culture     Status: None   Collection Time: 08/24/16  3:30 PM  Result Value Ref Range Status   Specimen Description FLUID  Final   Special Requests LEFT NEPHROSTOMY  Final   Gram Stain   Final    WBC PRESENT, PREDOMINANTLY PMN GRAM NEGATIVE RODS CYTOSPIN SMEAR Performed at [facility], [address], [place], [place] [address]    Culture FEW KLEBSIELLA PNEUMONIAE  Final   Report Status 08/28/2016 FINAL  Final   Organism ID, Bacteria KLEBSIELLA PNEUMONIAE  Final      Susceptibility   Klebsiella pneumoniae - MIC*    AMPICILLIN >=32 RESISTANT Resistant     CEFAZOLIN <=4 SENSITIVE Sensitive     CEFEPIME <=1 SENSITIVE Sensitive     CEFTAZIDIME <=1 SENSITIVE Sensitive     CEFTRIAXONE <=1 SENSITIVE Sensitive     CIPROFLOXACIN <=0.25 SENSITIVE Sensitive     GENTAMICIN <=1 SENSITIVE Sensitive     IMIPENEM <=0.25 SENSITIVE Sensitive     TRIMETH/SULFA <=20 SENSITIVE Sensitive     AMPICILLIN/SULBACTAM 8 SENSITIVE Sensitive     PIP/TAZO <=4 SENSITIVE Sensitive     Extended ESBL  NEGATIVE Sensitive     * FEW KLEBSIELLA PNEUMONIAE  MRSA PCR Screening     Status: None   Collection Time: 08/24/16  3:44 PM  Result Value Ref Range Status   MRSA by PCR NEGATIVE NEGATIVE Final    Comment:        The GeneXpert MRSA Assay (FDA approved for NASAL specimens only), is one component of a comprehensive MRSA colonization surveillance program. It is not intended to diagnose MRSA infection nor to guide or monitor treatment for MRSA infections.   Culture, respiratory (NON-Expectorated)     Status: None   Collection  Time: 08/25/16  7:00 AM  Result Value Ref Range Status   Specimen Description TRACHEAL ASPIRATE  Final   Special Requests NONE  Final   Gram Stain   Final    FEW WBC PRESENT, PREDOMINANTLY PMN FEW SQUAMOUS EPITHELIAL CELLS PRESENT NO ORGANISMS SEEN    Culture   Final    Consistent with normal respiratory flora. Performed at [facility], [address] [place], [place] [address]    Report Status 08/27/2016 FINAL  Final  C difficile quick scan w PCR reflex     Status: None   Collection Time: 08/26/16  8:05 PM  Result Value Ref Range Status   C Diff antigen NEGATIVE NEGATIVE Final   C Diff toxin NEGATIVE NEGATIVE Final   C Diff interpretation No C. difficile detected.  Final  Culture, blood (Routine X 2) w Reflex to ID Panel     Status: None   Collection Time: 08/30/16  9:03 AM  Result Value Ref Range Status   Specimen Description BLOOD LEFT ARM  Final   Special Requests   Final    BOTTLES DRAWN AEROBIC AND ANAEROBIC Blood Culture adequate volume   Culture NO GROWTH 5 DAYS  Final   Report Status 09/04/2016 FINAL  Final  Culture, blood (Routine X 2) w Reflex to ID Panel     Status: None   Collection Time: 08/30/16 10:18 AM  Result Value Ref Range Status   Specimen Description BLOOD LEFT ARM  Final   Special Requests   Final    BOTTLES DRAWN AEROBIC AND ANAEROBIC Blood Culture adequate volume   Culture NO GROWTH 5 DAYS  Final   Report Status 09/04/2016 FINAL  Final  Urine Culture     Status: None   Collection Time: 08/30/16 12:01 PM  Result Value Ref Range Status   Specimen Description URINE, RANDOM  Final   Special Requests NONE  Final   Culture   Final    NO GROWTH Performed at [facility], [address] [place], [place] [address]    Report Status 08/31/2016 FINAL  Final    Coagulation Studies: No results for input(s): LABPROT, INR in the last 72 hours.  Urinalysis: No results for input(s): COLORURINE, LABSPEC, PHURINE, GLUCOSEU, HGBUR, BILIRUBINUR,  KETONESUR, PROTEINUR, UROBILINOGEN, NITRITE, LEUKOCYTESUR in the last 72 hours.  Invalid input(s): APPERANCEUR    Imaging: Dg Abd 1 View  Result Date: 09/03/2016 CLINICAL DATA:  Kidney stone, kidney infection EXAM: ABDOMEN - 1 VIEW COMPARISON:  CT abdomen/pelvis dated 08/30/2016 FINDINGS: Left percutaneous nephrostomy catheter. Associated 8 mm calculus overlying the left lower kidney. No evidence of bowel obstruction. Degenerative changes of the lumbar spine. Visualized bony pelvis appears intact. IMPRESSION: Left percutaneous nephrostomy catheter. Associated 8 mm calculus overlying the left lower kidney. Electronically Signed   By: Charline Bills M.D.   On: 09/03/2016 21:01  Medications:    . cephALEXin  250 mg Oral Q12H  . citalopram  20 mg Oral Daily  . feeding supplement (NEPRO CARB STEADY)  237 mL Oral TID BM  . furosemide  40 mg Oral Daily  . heparin subcutaneous  5,000 Units Subcutaneous Q8H  . insulin aspart  0-9 Units Subcutaneous TID WC  . mouth rinse  15 mL Mouth Rinse q12n4p  . pantoprazole  40 mg Oral Daily  . sodium chloride flush  3 mL Intravenous Q12H  . verapamil  180 mg Oral Daily   acetaminophen **OR** [DISCONTINUED] acetaminophen, albuterol, bisacodyl, hydrALAZINE, ondansetron **OR** ondansetron (ZOFRAN) IV, senna-docusate, sodium chloride flush  Assessment/ Plan:  Ms. Megan Nixon is a 66 y.o. black female with diabetes mellitus type II, hypertension, history of CVA, who was admitted to [facility] on 08/24/2016 for septic shock  1. Acute renal failure with metabolic acidosis: baseline creatinine of 0.7 on 06/12/2016.  Acute renal failure secondary obstructive uropathy, sepsis, hypotension and ATN.  Left  hydrohephrosis- Percutaneous nephrostomy placed by [facility] on 08/24/16.  - Urology follow up - followup in our office with Dr. Wynelle LinkKolluru August 1 at 11:30 AM  2. Septic Shock: Klebsiella.  Afebrile. Leukocytosis    Klebsiella in urine  antibiotics as per  primary team  3. Cirrhosis and anasarca noted on CT and exam ? Etiology - continue low dose lasix     LOS: 12 Daily Doe 7/13/20183:16 PM

## 2016-09-05 NOTE — Care Management (Signed)
CIR denial upheld by [employer].  Will proceed to seek skilled nursing placement.  Attending indicates is medically stable for discharge today.

## 2016-09-05 NOTE — Progress Notes (Signed)
Physical Therapy Treatment Patient Details Name: Megan Nixon MRN: [id number] DOB: [birth year] Today's Date: 09/05/2016    History of Present Illness Pt. is a 66 y.o. female who was admitted to [facility] with septic Shock, UTI, and Multifocal Pneumonia. Pt. PMHx includes: HTN, DMII, Kidney stones, and CVA.    PT Comments    Pt makes significant progress with therapy today. She is able to participate with 53 minutes of physical therapy on this date and would tolerate more if time available on schedule. She is highly motivated to improve. Significant improvement in bed mobility and transfers on this date requiring CGA only. Pt is able to perform multiple bouts of sit to stand transfers and is able to stabilize today in standing without UE support. She is able to walk 30 feet total today in 2 bouts of 15' each. HR increases to 110 bpm and SaO2 drops to 88% on room air but recovers with seated rest break. She is able to perform supine, seated and standing strengthening and balance exercises with therapist today. Pt would be a great candidate for CIR however insurance is currently denying. Spoke with MD regarding DC recommendations. Pt will benefit from PT services to address deficits in strength, balance, and mobility in order to return to full function at home.   Follow Up Recommendations  CIR     Equipment Recommendations  Rolling walker with 5" wheels;Other (comment)    Recommendations for Other Services Rehab consult     Precautions / Restrictions Precautions Precautions: Fall Restrictions Weight Bearing Restrictions: No    Mobility  Bed Mobility Overal bed mobility: Needs Assistance Bed Mobility: Supine to Sit     Supine to sit: Supervision     General bed mobility comments: Pt demonstrates improved sequencing with HOB only minimally elevated and use of bed rails. Increased time required but no external assist at this time. Pt able to scoot foward to place feet on floor without  external assist.   Transfers Overall transfer level: Needs assistance Equipment used: Rolling walker (2 wheeled) Transfers: Sit to/from Stand Sit to Stand: Min guard         General transfer comment: Pt performed multiple bouts of sit to stand with therapist. Cues provided for proper hand placement. Pt able to perform without external assist and stabilize in standing without UE support although prefers to hold onto walker  Ambulation/Gait Ambulation/Gait assistance: Min assist;+2 safety/equipment Ambulation Distance (Feet): 30 Feet (15+15) Assistive device: Rolling walker (2 wheeled) Gait Pattern/deviations: Decreased step length - right;Decreased step length - left Gait velocity: Decreased Gait velocity interpretation: <1.8 ft/sec, indicative of risk for recurrent falls General Gait Details: Pt able to ambulate from bed to chair, rest, and then ambulate back. +2 present for safety. LE weakness but no overt LOB. Assist for steering walker and for turns. SaO2 drops to 88% and HR Increases to 110 bpm with ambulation.    Stairs            Wheelchair Mobility    Modified Rankin (Stroke Patients Only)       Balance Overall balance assessment: Needs assistance Sitting-balance support: Bilateral upper extremity supported Sitting balance-Leahy Scale: Fair     Standing balance support: No upper extremity supported Standing balance-Leahy Scale: Fair Standing balance comment: Pt able to perform static standing balance without UE support with therapist. Increased sway in narrow stance  Cognition Arousal/Alertness: Awake/alert Behavior During Therapy: WFL for tasks assessed/performed Overall Cognitive Status: Within Functional Limits for tasks assessed                                 General Comments: A&Ox4, affect improving      Exercises General Exercises - Upper Extremity Shoulder Flexion: Strengthening;Both;15  reps;Seated Elbow Flexion: Strengthening;Both;15 reps;Seated Elbow Extension: Strengthening;Both;15 reps;Seated General Exercises - Lower Extremity Ankle Circles/Pumps: AROM;Both;Supine;20 reps Quad Sets: Strengthening;Both;Supine;20 reps Gluteal Sets: Strengthening;Both;Supine;20 reps Long Arc Quad: Strengthening;Both;Seated;15 reps Heel Slides: Strengthening;Both;20 reps;Supine;Other (comment) (Resisted knee flexion x 15 in sitting bilateral) Hip ABduction/ADduction: Strengthening;Both;Supine;15 reps (Resisted x 15 bilateral in sitting for both abduction/adduct) Straight Leg Raises: Strengthening;Both;Supine;15 reps Hip Flexion/Marching: Strengthening;Both;15 reps;Seated Heel Raises: Strengthening;Both;15 reps;Seated Mini-Sqauts: Strengthening;Both;10 reps;Standing Other Exercises Other Exercises: standing marches x 10 bilateral, sit to stand with UE assist on chair 2 x 5.  Other Exercises: Static standing balance with patient with feet together and apart without UE support. Practiced stepping between wide and narrow stance without UE support    General Comments        Pertinent Vitals/Pain Pain Assessment: No/denies pain    Home Living                      Prior Function            PT Goals (current goals can now be found in the care plan section) Acute Rehab PT Goals Patient Stated Goal: To get better PT Goal Formulation: With patient/family Time For Goal Achievement: 09/14/16 Potential to Achieve Goals: Good Progress towards PT goals: Progressing toward goals    Frequency    Min 2X/week      PT Plan Current plan remains appropriate    Co-evaluation              AM-PAC PT "6 Clicks" Daily Activity  Outcome Measure  Difficulty turning over in bed (including adjusting bedclothes, sheets and blankets)?: A Little Difficulty moving from lying on back to sitting on the side of the bed? : A Little Difficulty sitting down on and standing up from a  chair with arms (e.g., wheelchair, bedside commode, etc,.)?: A Little Help needed moving to and from a bed to chair (including a wheelchair)?: A Little Help needed walking in hospital room?: A Little Help needed climbing 3-5 steps with a railing? : A Lot 6 Click Score: 17    End of Session Equipment Utilized During Treatment: Gait belt Activity Tolerance: Patient tolerated treatment well Patient left: in chair;with call bell/phone within reach;with chair alarm set Nurse Communication: Other (comment) (Spoke with MD regarding DC recommendations) PT Visit Diagnosis: Unsteadiness on feet (R26.81);Muscle weakness (generalized) (M62.81);Difficulty in walking, not elsewhere classified (R26.2)     Time: [id number] PT Time Calculation (min) (ACUTE ONLY): 53 min  Charges:  $Gait Training: 8-22 mins $Therapeutic Exercise: 23-37 mins $Therapeutic Activity: 8-22 mins                    G Codes:       Sharalyn InkJason D Gerson Fauth PT, DPT     Shelle Galdamez 09/05/2016, 10:32 AM

## 2016-09-05 NOTE — Clinical Social Work Note (Addendum)
CSW presented bed offers to patient and family they would like to go to [facility] of [address].  [facility]W contacted [facility] and they can accept patient today once insurance has given authorization.  CSW contacted insurance company and left a message for Boneta LucksJenny [phone] with status of insurance authorization.  CSW to continue to follow patient's progress throughout discharge planning.  Ervin KnackEric R. Serenitee Fuertes, MSW, Theresia MajorsLCSWA [phone]  09/05/16 12:14pm

## 2016-09-05 NOTE — Clinical Social Work Placement (Signed)
   CLINICAL SOCIAL WORK PLACEMENT  NOTE  Date:  09/05/2016  Patient Details  Name: Megan Nixon MRN: [id number] Date of Birth: 26-Feb-1950  Clinical Social Work is seeking post-discharge placement for this patient at the Skilled  Nursing Facility level of care (*CSW will initial, date and re-position this form in  chart as items are completed):  Yes   Patient/family provided with [facility] Clinical Social Work Department's list of facilities offering this level of care within the geographic area requested by the patient (or if unable, by the patient's family).  Yes   Patient/family informed of their freedom to choose among providers that offer the needed level of care, that participate in Medicare, Medicaid or managed care program needed by the patient, have an available bed and are willing to accept the patient.  Yes   Patient/family informed of [facility]'s ownership interest in [facility] and [facility], as well as of the fact that they are under no obligation to receive care at these facilities.  PASRR submitted to EDS on 09/04/16     PASRR number received on 09/04/16     Existing PASRR number confirmed on       FL2 transmitted to all facilities in geographic area requested by pt/family on 09/04/16     FL2 transmitted to all facilities within larger geographic area on       Patient informed that his/her managed care company has contracts with or will negotiate with certain facilities, including the following:        Yes   Patient/family informed of bed offers received.  [facility]     Physician recommends and patient chooses bed at      Patient to be transferred to [facility] [place] on 09/05/16.  [facility]     Patient family notified on 09/05/16 of transfer.  Name of family member notified:  Patient's husband Fredrik CoveRoger who was at bedside     PHYSICIAN Please sign  FL2     Additional Comment:    _______________________________________________ Darleene CleaverAnterhaus, Denece Shearer R, LCSWA 09/05/2016, 11:19 AM

## 2016-09-17 ENCOUNTER — Telehealth: Payer: Self-pay | Admitting: Radiology

## 2016-09-17 ENCOUNTER — Other Ambulatory Visit: Payer: Self-pay | Admitting: Radiology

## 2016-09-17 ENCOUNTER — Ambulatory Visit (INDEPENDENT_AMBULATORY_CARE_PROVIDER_SITE_OTHER): Payer: Medicare PPO | Admitting: Urology

## 2016-09-17 ENCOUNTER — Encounter: Payer: Self-pay | Admitting: Urology

## 2016-09-17 VITALS — BP 123/80 | HR 98 | Ht 65.0 in

## 2016-09-17 DIAGNOSIS — Z8619 Personal history of other infectious and parasitic diseases: Secondary | ICD-10-CM | POA: Diagnosis not present

## 2016-09-17 DIAGNOSIS — N200 Calculus of kidney: Secondary | ICD-10-CM | POA: Diagnosis not present

## 2016-09-17 DIAGNOSIS — Z780 Asymptomatic menopausal state: Secondary | ICD-10-CM | POA: Insufficient documentation

## 2016-09-17 DIAGNOSIS — N178 Other acute kidney failure: Secondary | ICD-10-CM | POA: Diagnosis not present

## 2016-09-17 DIAGNOSIS — I10 Essential (primary) hypertension: Secondary | ICD-10-CM

## 2016-09-17 DIAGNOSIS — R3129 Other microscopic hematuria: Secondary | ICD-10-CM

## 2016-09-17 HISTORY — DX: Essential (primary) hypertension: I10

## 2016-09-17 HISTORY — DX: Other microscopic hematuria: R31.29

## 2016-09-17 NOTE — Telephone Encounter (Signed)
Pt scheduled for left E[facility] on 09/25/16 with Dr Apolinar JunesBrandon. Pt aware. Instructions given. Questions answered. Pt voices understanding.

## 2016-09-17 NOTE — Progress Notes (Signed)
09/17/2016 11:51 AM   Megan Nixon [birth year] [id number]  Referring provider: Danella PentonMiller, Mark F, MD [phone] [facility] [place], [place] [address]  Chief Complaint  Patient presents with  . Nephrolithiasis    New Patient    HPI:  66 year old [facility] with severe sepsis requiring ICU admission, intubation, and CRRT secondary to a 5 mm obstructing left UPJ stone with proximal hydronephrosis and perinephric stranding.  She underwent emergent left nephrostomy tube placement.    She was ultimately discharged on 09/05/2016.  KUB just prior to discharge just showed the obstructing calculus no within the renal pelvis near the level of the nephrostomy tube. She also has a nonobstructing left lower pole stone.  Urine culture grew Klebsiella pneumonia, resistant only to ampicillin and indeterminant sensitivity to nitrofurantoin.  She ultimately completed greater than 2 week course of antibiotics.  Overall, her recovery is going well. She is been discharged from rehabilitation at his home. She is now ambulatory but has some trouble due to necrosis of her toes. Her energy is improving. No fevers or chills. No flank pain.  +history of stones. Required ESWL about 3 years ago. Has a history of negative workup for microscopic hematuria (renal sonogram 2012 - no stones seen).     PMH: Past Medical History:  Diagnosis Date  . Acute renal failure (HCC)   . Benign essential hypertension 09/17/2016  . Diabetes mellitus type 2, controlled, with complications (HCC) 11/21/2013   Overview:  microalbuminuria  . Diabetes mellitus without complication (HCC)   . 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 Hypertension   . Medicare annual wellness visit, initial 06/16/2016   Overview:  4/18  . Microscopic hematuria 09/17/2016  . Nephrolithiasis   . OSA (obstructive sleep apnea) 11/21/2013  . Stroke  [facility](HCC)     Surgical History: Past Surgical History:  Procedure Laterality Date  . BREAST EXCISIONAL BIOPSY Bilateral 1971   neg  . COMBINED LAPAROSCOPY W/ HYSTEROSCOPY    . EXTRACORPOREAL SHOCK WAVE LITHOTRIPSY  2013  . IR NEPHROSTOMY PLACEMENT LEFT  08/24/2016    Home Medications:  Allergies as of 09/17/2016   No Known Allergies     Medication List       Accurate as of 09/17/16 11:51 AM. Always use your most recent med list.          citalopram 20 MG tablet Commonly known as:  CELEXA Take 1 tablet (20 mg total) by mouth daily.   furosemide 40 MG tablet Commonly known as:  LASIX Take 1 tablet (40 mg total) by mouth daily.   glimepiride 1 MG tablet Commonly known as:  AMARYL   meloxicam 15 MG tablet Commonly known as:  MOBIC Take 15 mg by mouth daily.   pantoprazole 40 MG tablet Commonly known as:  PROTONIX Take 1 tablet (40 mg total) by mouth daily.       Allergies: No Known Allergies  Family History: Family History  Problem Relation Age of Onset  . Breast cancer Neg Hx   . Bladder Cancer Neg Hx   . Prostate cancer Neg Hx     Social History:  reports that she has never smoked. She has never used smokeless tobacco. She reports that she does not drink alcohol or use drugs.  ROS: UROLOGY Frequent Urination?: No Hard to postpone urination?: No Burning/pain with urination?: No Get up at night to urinate?: No Leakage of  urine?: No Urine stream starts and stops?: No Trouble starting stream?: No Do you have to strain to urinate?: No Blood in urine?: No Urinary tract infection?: No Sexually transmitted disease?: No Injury to kidneys or bladder?: No Painful intercourse?: No Weak stream?: No Currently pregnant?: No Vaginal bleeding?: No Last menstrual period?: n  Gastrointestinal Nausea?: No Vomiting?: No Indigestion/heartburn?: No Diarrhea?: No Constipation?: No  Constitutional Night sweats?: No Weight loss?: No Fatigue?: No  Skin Skin  rash/lesions?: No Itching?: No  Eyes Blurred vision?: No Double vision?: No  Ears/Nose/Throat Sore throat?: No Sinus problems?: No  Hematologic/Lymphatic Swollen glands?: No Easy bruising?: No  Cardiovascular Leg swelling?: No Chest pain?: No  Respiratory Cough?: No Shortness of breath?: No  Endocrine Excessive thirst?: No  Musculoskeletal Back pain?: No Joint pain?: No  Neurological Headaches?: No Dizziness?: No  Psychologic Depression?: No Anxiety?: No  Physical Exam: BP 123/80   Pulse 98   Ht 5\' 5"  (1.651 m)   Constitutional:  Alert and oriented, No acute distress.  Accompanied by husband today. Writing in wheelchair. HEENT: [place] AT, moist mucus membranes.  Trachea midline, no masses. Cardiovascular: No clubbing, cyanosis, or edema. Respiratory: Normal respiratory effort, no increased work of breathing. GI: Abdomen is soft, nontender, nondistended, no abdominal masses GU: No CVA tenderness. Left nephrostomy tube in place during clear urine. Skin: Necrosis with skin sloughing on digits, loss of nail on point her finger. Overall improved. Neurologic: Grossly intact, no focal deficits, moving all 4 extremities. Psychiatric: Normal mood and affect.  Laboratory Data: Lab Results  Component Value Date   WBC 18.1 (H) 09/04/2016   HGB 7.9 (L) 09/04/2016   HCT 23.9 (L) 09/04/2016   MCV 85.3 09/04/2016   PLT 141 (L) 09/04/2016    Lab Results  Component Value Date   CREATININE 3.93 (H) 09/04/2016   Urinalysis    Component Value Date/Time   COLORURINE AMBER (A) 08/24/2016 1004   APPEARANCEUR HAZY (A) 08/24/2016 1004   LABSPEC 1.019 08/24/2016 1004   PHURINE 5.0 08/24/2016 1004   GLUCOSEU NEGATIVE 08/24/2016 1004   HGBUR SMALL (A) 08/24/2016 1004   BILIRUBINUR NEGATIVE 08/24/2016 1004   KETONESUR 5 (A) 08/24/2016 1004   PROTEINUR 100 (A) 08/24/2016 1004   NITRITE POSITIVE (A) 08/24/2016 1004   LEUKOCYTESUR NEGATIVE 08/24/2016 1004    Pertinent  Imaging: KUB from 09/03/2016 as well as CT abdomen and pelvis from 08/30/2016 reviewed. There are 2 stones, one approximately 8 mm in the renal pelvis around the coil of the nephrostomy tube as well as an approximately 8 mm left lower pole stone.  Assessment & Plan:   1. Kidney stone on left side 2 left renal calculi, one which was presumably the obstructing stone and coiled within the pigtail of the nephrostomy tube as well as an 8 mm nonobstructing left lower pole stone.  I highly recommended treatment of the renal pelvic stone as its possible that this could cause obstruction and infection and again if not treated. Options including ureteroscopy and treating both stents the same time versus staged ESWL for treatment of first the renal pelvic stone followed by the lower pole stone were discussed. Risks and benefits of each were discussed.  She's had successful laser lithotripsy in the past and she would like to proceed with this. Risk of bleeding, infection, damages stranding structures, arrhythmia, renal hematoma, Hispanic injury all discussed. We will repeat a culture from her left nephrostomy tube today. We will plan on double covering her with ceftriaxone and  gentamicin at the time of the procedure reduce the risk of urosepsis. All questions were answered.  - CULTURE, URINE COMPREHENSIVE  2. History of gram negative sepsis As above, recovering well from life-threatening infection  3. Acute renal failure with other specified pathological lesion in kidney [facility](HCC) Renal function is stabilized, has follow-up with nephrology next Wednesday  Schedule left ESWL  Vanna ScotlandAshley Sylvan Sookdeo, MD  [facility] [address], [address] [place], [place] [address] [phone]   I spent 25 min with this patient of which greater than 50% was spent in counseling and coordination of care with the patient.

## 2016-09-20 LAB — CULTURE, URINE COMPREHENSIVE

## 2016-09-24 DIAGNOSIS — N200 Calculus of kidney: Secondary | ICD-10-CM

## 2016-09-24 HISTORY — DX: Calculus of kidney: N20.0

## 2016-09-25 ENCOUNTER — Telehealth: Payer: Self-pay | Admitting: Radiology

## 2016-09-25 ENCOUNTER — Ambulatory Visit: Admission: RE | Admit: 2016-09-25 | Payer: Medicare PPO | Source: Ambulatory Visit | Admitting: Urology

## 2016-09-25 ENCOUNTER — Other Ambulatory Visit: Payer: Self-pay | Admitting: Radiology

## 2016-09-25 ENCOUNTER — Encounter: Admission: RE | Payer: Self-pay | Source: Ambulatory Visit

## 2016-09-25 SURGERY — LITHOTRIPSY, ESWL
Anesthesia: Moderate Sedation | Laterality: Left

## 2016-09-25 NOTE — Telephone Encounter (Signed)
Not[facility] pt [facility] for an abnormal EKG. Pt states she has an appt with her PCP on 09/29/16 so clearance request was sent to PCP. Advised pt that ESWL scheduled for 09/25/16 would be rescheduled to 10/02/16 with arrival time of 7:45am. Advised pt to follow instructions previously given for procedure. Pt voices understanding.

## 2016-09-30 ENCOUNTER — Encounter: Payer: Self-pay | Admitting: *Deleted

## 2016-10-02 ENCOUNTER — Ambulatory Visit: Payer: Medicare PPO

## 2016-10-02 ENCOUNTER — Encounter
Admission: RE | Disposition: A | Discharge status: Home / Self Care | Payer: Self-pay | Source: Ambulatory Visit | Attending: Urology

## 2016-10-02 ENCOUNTER — Ambulatory Visit
Admission: RE | Admit: 2016-10-02 | Discharge: 2016-10-02 | Disposition: A | Discharge status: Home / Self Care | Payer: Medicare PPO | Source: Ambulatory Visit | Attending: Urology | Admitting: Urology

## 2016-10-02 ENCOUNTER — Ambulatory Visit: Admitting: Urology

## 2016-10-02 ENCOUNTER — Encounter: Payer: Self-pay | Admitting: *Deleted

## 2016-10-02 DIAGNOSIS — Z8673 Personal history of transient ischemic attack (TIA), and cerebral infarction without residual deficits: Secondary | ICD-10-CM | POA: Insufficient documentation

## 2016-10-02 DIAGNOSIS — G4733 Obstructive sleep apnea (adult) (pediatric): Secondary | ICD-10-CM | POA: Diagnosis not present

## 2016-10-02 DIAGNOSIS — N132 Hydronephrosis with renal and ureteral calculous obstruction: Secondary | ICD-10-CM | POA: Diagnosis not present

## 2016-10-02 DIAGNOSIS — G4730 Sleep apnea, unspecified: Secondary | ICD-10-CM | POA: Insufficient documentation

## 2016-10-02 DIAGNOSIS — N200 Calculus of kidney: Secondary | ICD-10-CM

## 2016-10-02 DIAGNOSIS — E119 Type 2 diabetes mellitus without complications: Secondary | ICD-10-CM | POA: Diagnosis not present

## 2016-10-02 DIAGNOSIS — Z7984 Long term (current) use of oral hypoglycemic drugs: Secondary | ICD-10-CM | POA: Diagnosis not present

## 2016-10-02 DIAGNOSIS — I10 Essential (primary) hypertension: Secondary | ICD-10-CM | POA: Insufficient documentation

## 2016-10-02 DIAGNOSIS — Z79899 Other long term (current) drug therapy: Secondary | ICD-10-CM | POA: Insufficient documentation

## 2016-10-02 HISTORY — PX: EXTRACORPOREAL SHOCK WAVE LITHOTRIPSY: SHX1557

## 2016-10-02 LAB — GLUCOSE, CAPILLARY: GLUCOSE-CAPILLARY: 109 mg/dL — AB (ref 65–99)

## 2016-10-02 SURGERY — LITHOTRIPSY, ESWL
Anesthesia: Moderate Sedation | Laterality: Left

## 2016-10-02 MED ORDER — ONDANSETRON HCL 4 MG/2ML IJ SOLN
INTRAMUSCULAR | Status: AC
  Administered 2016-10-02: 4 mg via INTRAVENOUS
  Filled 2016-10-02: qty 2

## 2016-10-02 MED ORDER — SODIUM CHLORIDE 0.9 % IV SOLN
INTRAVENOUS | Status: DC
  Administered 2016-10-02: 09:00:00 via INTRAVENOUS

## 2016-10-02 MED ORDER — DIPHENHYDRAMINE HCL 25 MG PO CAPS
25.0000 mg | ORAL_CAPSULE | ORAL | Status: AC
  Administered 2016-10-02: 25 mg via ORAL

## 2016-10-02 MED ORDER — PENTAFLUOROPROP-TETRAFLUOROETH EX AERO
INHALATION_SPRAY | CUTANEOUS | Status: AC
  Administered 2016-10-02: 1
  Filled 2016-10-02: qty 30

## 2016-10-02 MED ORDER — DEXTROSE 5 % IV SOLN
1.0000 g | Freq: Once | INTRAVENOUS | Status: AC
  Administered 2016-10-02: 1 g via INTRAVENOUS
  Filled 2016-10-02: qty 10

## 2016-10-02 MED ORDER — DIAZEPAM 5 MG PO TABS
ORAL_TABLET | ORAL | Status: AC
  Administered 2016-10-02: 10 mg via ORAL
  Filled 2016-10-02: qty 2

## 2016-10-02 MED ORDER — GENTAMICIN SULFATE 40 MG/ML IJ SOLN
80.0000 mg | Freq: Once | INTRAMUSCULAR | Status: DC
  Filled 2016-10-02: qty 2

## 2016-10-02 MED ORDER — HYDROCODONE-ACETAMINOPHEN 5-325 MG PO TABS: 1.0000 | ORAL_TABLET | Freq: Four times a day (QID) | ORAL | 0 refills | Status: DC | PRN

## 2016-10-02 MED ORDER — DIAZEPAM 5 MG PO TABS
10.0000 mg | ORAL_TABLET | ORAL | Status: AC
  Administered 2016-10-02: 10 mg via ORAL

## 2016-10-02 MED ORDER — ONDANSETRON HCL 4 MG/2ML IJ SOLN
4.0000 mg | Freq: Once | INTRAMUSCULAR | Status: AC
  Administered 2016-10-02: 4 mg via INTRAVENOUS

## 2016-10-02 MED ORDER — TAMSULOSIN HCL 0.4 MG PO CAPS: 0.4000 mg | ORAL_CAPSULE | Freq: Every day | ORAL | 0 refills | Status: DC

## 2016-10-02 MED ORDER — DIPHENHYDRAMINE HCL 25 MG PO CAPS
ORAL_CAPSULE | ORAL | Status: AC
  Administered 2016-10-02: 25 mg via ORAL
  Filled 2016-10-02: qty 1

## 2016-10-02 MED ORDER — GENTAMICIN IN SALINE 1.6-0.9 MG/ML-% IV SOLN
80.0000 mg | Freq: Once | INTRAVENOUS | Status: AC
  Administered 2016-10-02: 80 mg via INTRAVENOUS
  Filled 2016-10-02: qty 50

## 2016-10-02 NOTE — H&P (View-Only) (Signed)
 09/17/2016 11:51 AM   Megan Nixon 10/26/1950 [id number]  Referring provider: Miller, Mark F, MD [address] [facility] [place], [place] [address]  Chief Complaint  Patient presents with  . Nephrolithiasis    New Patient    HPI:  66-year-old female admitted to [facility] on 08/24/2016 with severe sepsis requiring ICU admission, intubation, and CRRT secondary to a 5 mm obstructing left UPJ stone with proximal hydronephrosis and perinephric stranding.  She underwent emergent left nephrostomy tube placement.    She was ultimately discharged on 09/05/2016.  KUB just prior to discharge just showed the obstructing calculus no within the renal pelvis near the level of the nephrostomy tube. She also has a nonobstructing left lower pole stone.  Urine culture grew Klebsiella pneumonia, resistant only to ampicillin and indeterminant sensitivity to nitrofurantoin.  She ultimately completed greater than 2 week course of antibiotics.  Overall, her recovery is going well. She is been discharged from rehabilitation at his home. She is now ambulatory but has some trouble due to necrosis of her toes. Her energy is improving. No fevers or chills. No flank pain.  +history of stones. Required ESWL about 3 years ago. Has a history of negative workup for microscopic hematuria (renal sonogram 2012 - no stones seen).     PMH: Past Medical History:  Diagnosis Date  . Acute renal failure (HCC)   . Benign essential hypertension 09/17/2016  . Diabetes mellitus type 2, controlled, with complications (HCC) 11/21/2013   Overview:  microalbuminuria  . Diabetes mellitus without complication (HCC)   . 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 Hypertension   . Medicare annual wellness visit, initial 06/16/2016   Overview:  4/18  . Microscopic hematuria 09/17/2016  . Nephrolithiasis   . OSA (obstructive sleep apnea) 11/21/2013  . Stroke  (HCC)     Surgical History: Past Surgical History:  Procedure Laterality Date  . BREAST EXCISIONAL BIOPSY Bilateral 1971   neg  . COMBINED LAPAROSCOPY W/ HYSTEROSCOPY    . EXTRACORPOREAL SHOCK WAVE LITHOTRIPSY  2013  . IR NEPHROSTOMY PLACEMENT LEFT  08/24/2016    Home Medications:  Allergies as of 09/17/2016   No Known Allergies     Medication List       Accurate as of 09/17/16 11:51 AM. Always use your most recent med list.          citalopram 20 MG tablet Commonly known as:  CELEXA Take 1 tablet (20 mg total) by mouth daily.   furosemide 40 MG tablet Commonly known as:  LASIX Take 1 tablet (40 mg total) by mouth daily.   glimepiride 1 MG tablet Commonly known as:  AMARYL   meloxicam 15 MG tablet Commonly known as:  MOBIC Take 15 mg by mouth daily.   pantoprazole 40 MG tablet Commonly known as:  PROTONIX Take 1 tablet (40 mg total) by mouth daily.       Allergies: No Known Allergies  Family History: Family History  Problem Relation Age of Onset  . Breast cancer Neg Hx   . Bladder Cancer Neg Hx   . Prostate cancer Neg Hx     Social History:  reports that she has never smoked. She has never used smokeless tobacco. She reports that she does not drink alcohol or use drugs.  ROS: UROLOGY Frequent Urination?: No Hard to postpone urination?: No Burning/pain with urination?: No Get up at night to urinate?: No Leakage of   urine?: No Urine stream starts and stops?: No Trouble starting stream?: No Do you have to strain to urinate?: No Blood in urine?: No Urinary tract infection?: No Sexually transmitted disease?: No Injury to kidneys or bladder?: No Painful intercourse?: No Weak stream?: No Currently pregnant?: No Vaginal bleeding?: No Last menstrual period?: n  Gastrointestinal Nausea?: No Vomiting?: No Indigestion/heartburn?: No Diarrhea?: No Constipation?: No  Constitutional Night sweats?: No Weight loss?: No Fatigue?: No  Skin Skin  rash/lesions?: No Itching?: No  Eyes Blurred vision?: No Double vision?: No  Ears/Nose/Throat Sore throat?: No Sinus problems?: No  Hematologic/Lymphatic Swollen glands?: No Easy bruising?: No  Cardiovascular Leg swelling?: No Chest pain?: No  Respiratory Cough?: No Shortness of breath?: No  Endocrine Excessive thirst?: No  Musculoskeletal Back pain?: No Joint pain?: No  Neurological Headaches?: No Dizziness?: No  Psychologic Depression?: No Anxiety?: No  Physical Exam: BP 123/80   Pulse 98   Ht 5\' 5"  (1.651 m)   Constitutional:  Alert and oriented, No acute distress.  Accompanied by husband today. Writing in wheelchair. HEENT: [place] AT, moist mucus membranes.  Trachea midline, no masses. Cardiovascular: No clubbing, cyanosis, or edema. Respiratory: Normal respiratory effort, no increased work of breathing. GI: Abdomen is soft, nontender, nondistended, no abdominal masses GU: No CVA tenderness. Left nephrostomy tube in place during clear urine. Skin: Necrosis with skin sloughing on digits, loss of nail on point her finger. Overall improved. Neurologic: Grossly intact, no focal deficits, moving all 4 extremities. Psychiatric: Normal mood and affect.  Laboratory Data: Lab Results  Component Value Date   WBC 18.1 (H) 09/04/2016   HGB 7.9 (L) 09/04/2016   HCT 23.9 (L) 09/04/2016   MCV 85.3 09/04/2016   PLT 141 (L) 09/04/2016    Lab Results  Component Value Date   CREATININE 3.93 (H) 09/04/2016   Urinalysis    Component Value Date/Time   COLORURINE AMBER (A) 08/24/2016 1004   APPEARANCEUR HAZY (A) 08/24/2016 1004   LABSPEC 1.019 08/24/2016 1004   PHURINE 5.0 08/24/2016 1004   GLUCOSEU NEGATIVE 08/24/2016 1004   HGBUR SMALL (A) 08/24/2016 1004   BILIRUBINUR NEGATIVE 08/24/2016 1004   KETONESUR 5 (A) 08/24/2016 1004   PROTEINUR 100 (A) 08/24/2016 1004   NITRITE POSITIVE (A) 08/24/2016 1004   LEUKOCYTESUR NEGATIVE 08/24/2016 1004    Pertinent  Imaging: KUB from 09/03/2016 as well as CT abdomen and pelvis from 08/30/2016 reviewed. There are 2 stones, one approximately 8 mm in the renal pelvis around the coil of the nephrostomy tube as well as an approximately 8 mm left lower pole stone.  Assessment & Plan:   1. Kidney stone on left side 2 left renal calculi, one which was presumably the obstructing stone and coiled within the pigtail of the nephrostomy tube as well as an 8 mm nonobstructing left lower pole stone.  I highly recommended treatment of the renal pelvic stone as its possible that this could cause obstruction and infection and again if not treated. Options including ureteroscopy and treating both stents the same time versus staged ESWL for treatment of first the renal pelvic stone followed by the lower pole stone were discussed. Risks and benefits of each were discussed.  She's had successful laser lithotripsy in the past and she would like to proceed with this. Risk of bleeding, infection, damages stranding structures, arrhythmia, renal hematoma, Hispanic injury all discussed. We will repeat a culture from her left nephrostomy tube today. We will plan on double covering her with ceftriaxone and  gentamicin at the time of the procedure reduce the risk of urosepsis. All questions were answered.  - CULTURE, URINE COMPREHENSIVE  2. History of gram negative sepsis As above, recovering well from life-threatening infection  3. Acute renal failure with other specified pathological lesion in kidney [facility](HCC) Renal function is stabilized, has follow-up with nephrology next Wednesday  Schedule left ESWL  Vanna ScotlandAshley Taryn Nave, MD  [facility] [address], [address] [place], [place] [address] [phone]   I spent 25 min with this patient of which greater than 50% was spent in counseling and coordination of care with the patient.

## 2016-10-02 NOTE — Discharge Instructions (Signed)
AMBULATORY SURGERY  DISCHARGE INSTRUCTIONS   1) The drugs that you were given will stay in your system until tomorrow so for the next 24 hours you should not:  A) Drive an automobile B) Make any legal decisions C) Drink any alcoholic beverage   2) You may resume regular meals tomorrow.  Today it is better to start with liquids and gradually work up to solid foods.  You may eat anything you prefer, but it is better to start with liquids, then soup and crackers, and gradually work up to solid foods.   3) Please notify your doctor immediately if you have any unusual bleeding, trouble breathing, redness and pain at the surgery site, drainage, fever, or pain not relieved by medication.    4) Additional Instructions:   Please contact your physician with any problems or Same Day Surgery at [phone], Monday through Friday 6 am to 4 pm, or [facility] number at [phone].See [facility] discharge instructions in chart.  

## 2016-10-02 NOTE — Interval H&P Note (Signed)
History and Physical Interval Note:  10/02/2016 10:52 AM  Megan Nixon  has presented today for surgery, with the diagnosis of Kidney stone  The various methods of treatment have been discussed with the patient and family. After consideration of risks, benefits and other options for treatment, the patient has consented to  Procedure(s): EXTRACORPOREAL SHOCK WAVE LITHOTRIPSY (ESWL) (Left) as a surgical intervention .  The patient's history has been reviewed, patient examined, no change in status, stable for surgery.  I have reviewed the patient's chart and labs.  Questions were answered to the patient's satisfaction.     Vanna ScotlandAshley Elizabella Nolet

## 2016-10-03 ENCOUNTER — Ambulatory Visit (INDEPENDENT_AMBULATORY_CARE_PROVIDER_SITE_OTHER): Payer: Medicare PPO | Admitting: Vascular Surgery

## 2016-10-03 ENCOUNTER — Encounter (INDEPENDENT_AMBULATORY_CARE_PROVIDER_SITE_OTHER): Payer: Self-pay | Admitting: Vascular Surgery

## 2016-10-03 VITALS — BP 123/78 | HR 98 | Resp 15 | Ht 65.0 in | Wt 174.0 lb

## 2016-10-03 DIAGNOSIS — Z8673 Personal history of transient ischemic attack (TIA), and cerebral infarction without residual deficits: Secondary | ICD-10-CM | POA: Diagnosis not present

## 2016-10-03 DIAGNOSIS — I96 Gangrene, not elsewhere classified: Secondary | ICD-10-CM | POA: Diagnosis not present

## 2016-10-03 DIAGNOSIS — E118 Type 2 diabetes mellitus with unspecified complications: Secondary | ICD-10-CM

## 2016-10-03 DIAGNOSIS — N172 Acute kidney failure with medullary necrosis: Secondary | ICD-10-CM

## 2016-10-03 DIAGNOSIS — I10 Essential (primary) hypertension: Secondary | ICD-10-CM

## 2016-10-03 NOTE — Patient Instructions (Signed)
Gangrene °Gangrene is a medical condition caused by a lack of blood supply to an area of your body. Oxygen travels through your blood, so less blood means less oxygen. Without oxygen, your body tissue will start to die. °Gangrene can affect many parts of your body. Gangrene is most common on the skin (external gangrene), but it can also affect internal parts of the body (internal gangrene). °Gangrene requires emergency treatment. °What are the causes? °Any condition that cuts off blood supply can cause gangrene. Common causes include: °· Injuries. °· Blood vessel disease. °· Diabetes. °· Infections. ° °What increases the risk? °You may be at increased risk for gangrene if you have: °· A serious injury. °· Recent surgery. °· Diabetes. °· A weak body defense system (immune system). °· Narrowing of your arteries (arteriosclerosis). °· An immune system disease that causes your arteries to constrict (Raynaud disease). °· A history of IV drug use. ° °What are the signs or symptoms? °The signs and symptoms depend on what part of your body is affected. If you have external gangrene, you may have: °· Severe pain followed by loss of feeling. °· Redness and swelling. °· Crackling sounds when you press on a swollen area of skin. °· A wound with bad-smelling drainage. °· Changes in the color of your skin. It may turn red, blue, black, or white. °· Fever and chills. ° °If you have internal gangrene, you may have: °· Fever and chills. °· Confusion. °· Dizziness. °· Severe pain. °· Rapid heartbeat and breathing. °· Loss of appetite. °· Nausea or vomiting. ° °How is this diagnosed? °To diagnose gangrene, your health care provider will take your medical history and do a physical exam. Tests may also be done to help in making the diagnosis. These may include: °· X-rays of your blood vessels after injection of a dye (arteriogram). °· Blood tests to look for an infection in your blood. °· Imaging studies such as a CT scan or  MRI. °· Taking a swab from a draining wound to look for bacteria in the laboratory (culture). °· Taking a piece of tissue (biopsy) to look for cell death in the laboratory. °· A surgical procedure to look for gangrene inside your body. ° °How is this treated? °Treatment for gangrene depends on the cause and the area of your body that is affected. Gangrene is usually treated in the hospital. It is very important to start treatment early before gangrene spreads. Treatment may include the following: °· Medicine. You may get high doses of antibiotic medicine for gangrene caused by infection. The antibiotics may be given directly into a vein through an IV tube. °· Surgery. Surgical options may include: °? Debridement. This is surgery to remove dead tissue. You may need to have debridement several times. °? Bypass or angioplasty. This surgery improves the blood supply to an affected area. °? Amputation. Sometimes it is necessary to remove a body part. °· Oxygen therapy. This involves treatment in a chamber designed to provide high levels of oxygen (hyperbaric oxygen therapy). It can improve the oxygen supply to an affected area. °· Supportive care. This may include: °? IV fluids and nutrients. °? Pain medicines. °? Blood thinners to prevent blood clots. ° °Follow these instructions at home: °· Take medicines only as directed by your health care provider. °· If you were prescribed an antibiotic medicine, finish it all even if you start to feel better. °· Clean any cuts or scratches with an antiseptic. °· Cover any open wounds. °·   Check wounds for signs of infection. Watch for redness or swelling. °· Do not use any tobacco products, including cigarettes, chewing tobacco, or electronic cigarettes. If you need help quitting, ask your health care provider. °· Limit alcohol intake to no more than 1 drink per day for nonpregnant women and 2 drinks per day for men. One drink equals 12 ounces of beer, 5 ounces of wine, or 1½  ounces of hard liquor. °· Eat a healthy diet and maintain a healthy weight. °· Get some exercise on most days of the week. Ask your health care provider to suggest activities that are safe for you. °· If you have diabetes or another blood vessel disease, check your feet often for signs of injury or infection. °· After any surgery you have, follow your health care provider's instructions carefully. °· Keep all follow-up visits as directed by your health care provider. This is important. °Contact a health care provider if: °· You have a wound or sore that is not healing. °· You have color changes (white, red, blue, or black) in an area of your skin. °· You have a wound or sore with smelly drainage. °Get help right away if: °· You have rapidly worsening pain at the site of a skin infection or wound. °· You lose sensation at the site of a skin infection or wound. °· You have unexplained: °? Fever. °? Chills. °? Confusion. °? Fainting. °This information is not intended to replace advice given to you by your health care provider. Make sure you discuss any questions you have with your health care provider. °Document Released: 12/13/2003 Document Revised: 07/19/2015 Document Reviewed: 04/06/2013 °Elsevier Interactive Patient Education © 2018 [employer] ° °

## 2016-10-03 NOTE — Assessment & Plan Note (Signed)
Renal function improved but not entirely normal.

## 2016-10-03 NOTE — Progress Notes (Signed)
Pupils   Patient ID: KEYLA MILONE, female   DOB: January 12, 1951, 66 y.o.   MRN: [id number]  No chief complaint on file.   HPI BEKKA QIAN is a 66 y.o. female.  I am asked to see the patient by [facility] for evaluation of gangrenous toes.  The patient reports an episode several months ago now with severe sepsis from what sounds like an infected stone. She developed acute renal failure and still has some degree of renal dysfunction although she says it has improved. She was in the ICU for 10 days on pressors and developed gangrenous changes to multiple digits. Her right fourth toe has a gangrenous tip and her left first second and third toes have gangrenous changes worse on the second third toe than the first. She also has 2 fingertips with gangrenous discoloration under the tips of the nails on her right hand. None of these are painful. She does not have fever or chills. Prior to this, she did not have any antecedent claudication, ischemic rest pain, or ulceration history. She does have multiple medical issues as listed below.   Past Medical History: Diagnosis Date . Acute renal failure ([place]) 2018 . Benign essential hypertension 09/17/2016 . Diabetes mellitus type 2, controlled, with complications ([place]) 0/11/2723  Overview:  microalbuminuria . Diabetes mellitus without complication ([place])  . 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 Hypertension  . Medicare annual wellness visit, initial 06/16/2016  Overview:  4/18 . Microscopic hematuria 09/17/2016 . Nephrolithiasis 09/2016 . OSA (obstructive sleep apnea) 11/21/2013 . Stroke [facility]) 04/2014   Past Surgical History: Procedure Laterality Date . BREAST EXCISIONAL BIOPSY Bilateral 1971  neg . COMBINED LAPAROSCOPY W/ HYSTEROSCOPY   . EXTRACORPOREAL SHOCK WAVE LITHOTRIPSY  2013 . EXTRACORPOREAL SHOCK WAVE LITHOTRIPSY Left 10/02/2016  Procedure: EXTRACORPOREAL SHOCK WAVE LITHOTRIPSY (ESWL);  Surgeon:  Hollice Espy, MD;  Location: [facility] ORS;  Service: Urology;  Laterality: Left; . IR NEPHROSTOMY PLACEMENT LEFT  08/24/2016 . VAGINAL HYSTERECTOMY     Family History Problem Relation Age of Onset . Breast cancer Neg Hx  . Bladder Cancer Neg Hx  . Prostate cancer Neg Hx  No bleeding disorders, clotting disorders, or aneurysms  Social History Social History Substance Use Topics . Smoking status: Never Smoker . Smokeless tobacco: Never Used . Alcohol use No No IVDU  No Known Allergies  Current Outpatient Prescriptions Medication Sig Dispense Refill . aspirin EC 81 MG tablet Take 81 mg by mouth daily.   . citalopram (CELEXA) 20 MG tablet Take 1 tablet (20 mg total) by mouth daily. 30 tablet 2 . furosemide (LASIX) 40 MG tablet Take 1 tablet (40 mg total) by mouth daily. 30 tablet 0 . glimepiride (AMARYL) 1 MG tablet    . HYDROcodone-acetaminophen (NORCO/VICODIN) 5-325 MG tablet Take 1-2 tablets by mouth every 6 (six) hours as needed for moderate pain. 10 tablet 0 . meloxicam (MOBIC) 15 MG tablet Take 15 mg by mouth daily.   . pantoprazole (PROTONIX) 40 MG tablet Take 1 tablet (40 mg total) by mouth daily. 40 tablet 0 . tamsulosin (FLOMAX) 0.4 MG CAPS capsule Take 1 capsule (0.4 mg total) by mouth daily. 30 capsule 0 . verapamil (VERELAN PM) 180 MG 24 hr capsule Take 180 mg by mouth at bedtime.   . vitamin B-12 (CYANOCOBALAMIN) 1000 MCG tablet Take 1,000 mcg by mouth daily.    No current facility-administered medications for this visit.      REVIEW OF SYSTEMS (Negative unless checked)  Constitutional: Weight loss  Fever  Chills Cardiac: Chest pain   Chest pressure   Palpitations   Shortness of breath when laying flat   Shortness of breath at rest   Shortness of breath with exertion. Vascular:  Pain in legs with walking   Pain in legs at rest   Pain in legs when laying flat   Claudication   Pain in feet when walking  Pain in feet at rest  Pain in feet when laying flat   History of  DVT   Phlebitis   Swelling in legs   Varicose veins   Non-healing ulcers Pulmonary:   Uses home oxygen   Productive cough   Hemoptysis   Wheeze  COPD   Asthma Neurologic:  Dizziness  Blackouts   Seizures   + for History of stroke   History of TIA  Aphasia   Temporary blindness   Dysphagia   Weakness or numbness in arms   Weakness or numbness in legs Musculoskeletal:  Arthritis   Joint swelling   Joint pain   Low back pain Hematologic:  Easy bruising  Easy bleeding   Hypercoagulable state   Anemic  Hepatitis Gastrointestinal:  Blood in stool   Vomiting blood  Gastroesophageal reflux/heartburn   Abdominal pain Genitourinary: + for Chronic kidney disease   Difficult urination  Frequent urination  Burning with urination  + for Hematuria Skin:  Rashes  + for Ulcers  + for Wounds Psychological:  History of anxiety    History of major depression.    Physical Exam There were no vitals taken for this visit. Gen:  WD/WN, NAD Head: [place]/AT, No temporalis wasting.  Ear/Nose/Throat: Hearing grossly intact, nares w/o erythema or drainage, oropharynx w/o Erythema/Exudate Eyes: Conjunctiva clear, sclera non-icteric  Neck: trachea midline.  No JVD.  Pulmonary:  Good air movement, respirations not labored, no use of accessory muscles Cardiac: RRR, normal S1, S2 Vascular:  Vessel Right Left Radial Palpable Palpable  PT Palpable Palpable DP Palpable Palpable  Gastrointestinal: soft, non-tender/non-distended.  Musculoskeletal: M/S 5/5 throughout.   No deformity or atrophy. 1+ bilateral lower extremity edema. Less than 1 cm gangrenous changes to the tip of the right fourth toe and fingers 2 and 3 on the right hand without erythema or drainage. Left first second third toes have gangrenous changes with gangrenous changes to the mid toe on toes 2 and 3 and isolated to the tip on the first toe. No erythema, purulent drainage, or foul odor Neurologic: Sensation grossly intact in extremities.  Symmetrical.  Speech  is fluent. Motor exam as listed above. Psychiatric: Judgment intact, Mood & affect appropriate for pt's clinical situation. Dermatologic: Gangrenous changes as described above  Radiology Dg Abd 1 View  Result Date: 10/02/2016 CLINICAL DATA:  Lithotripsy today. EXAM: ABDOMEN - 1 VIEW COMPARISON:  One-view abdomen 09/03/2016 FINDINGS: A left-sided nephrostomy tube is in place. [place] 2 discrete left renal stones are noted. Layering stones are present in the gallbladder. Pelvic phleboliths are again noted. IMPRESSION: 1. Stable appearance of left-sided nephrolithiasis. 2. Left-sided percutaneous nephrostomy tube. 3. Cholelithiasis. Electronically Signed   By: San Morelle M.D.   On: 10/02/2016 08:37   Dg Abd 1 View  Result Date: 09/03/2016 CLINICAL DATA:  Kidney stone, kidney infection EXAM: ABDOMEN - 1 VIEW COMPARISON:  CT abdomen/pelvis dated 08/30/2016 FINDINGS: Left percutaneous nephrostomy catheter. Associated 8 mm calculus overlying the left lower kidney. No evidence of bowel obstruction. Degenerative changes of the lumbar spine. Visualized bony pelvis appears intact.  IMPRESSION: Left percutaneous nephrostomy catheter. Associated 8 mm calculus overlying the left lower kidney. Electronically Signed   By: Julian Hy M.D.   On: 09/03/2016 21:01    Labs Recent Results (from the past 2160 hour(s)) Glucose, capillary     Status: Abnormal  Collection Time: 08/24/16  7:12 AM Result Value Ref Range  Glucose-Capillary 101 (H) 65 - 99 mg/dL Blood gas, arterial     Status: Abnormal  Collection Time: 08/24/16  7:32 AM Result Value Ref Range  FIO2 0.28   Delivery systems NASAL CANNULA   pH, Arterial 7.34 (L) 7.350 - 7.450  pCO2 arterial 26 (L) 32.0 - 48.0 mmHg  pO2, Arterial 67 (L) 83.0 - 108.0 mmHg  Bicarbonate 14.0 (L) 20.0 - 28.0 mmol/L  Acid-base deficit 10.1 (H) 0.0 - 2.0 mmol/L  O2 Saturation 91.7 %  Patient temperature 37.0   Collection site RIGHT RADIAL   Sample  type ARTERIAL DRAW   Allens test (pass/fail) PASS PASS CBC     Status: Abnormal  Collection Time: 08/24/16  8:24 AM Result Value Ref Range  WBC 14.8 (H) 3.6 - 11.0 K/uL  RBC 4.44 3.80 - 5.20 MIL/uL  Hemoglobin 13.0 12.0 - 16.0 g/dL  HCT 39.5 35.0 - 47.0 %  MCV 88.9 80.0 - 100.0 fL  MCH 29.2 26.0 - 34.0 pg  MCHC 32.9 32.0 - 36.0 g/dL  RDW 16.3 (H) 11.5 - 14.5 %  Platelets 87 (L) 150 - 440 K/uL Lactic acid, plasma     Status: Abnormal  Collection Time: 08/24/16  8:24 AM Result Value Ref Range  Lactic Acid, Venous 10.6 (HH) 0.5 - 1.9 mmol/L   Comment: CRITICAL RESULT CALLED TO, READ BACK BY AND VERIFIED WITH KIM MAIN ON 08/24/16 AT 0933 BY [facility] RESULT CONFIRMED BY MANUAL DILUTION  Glucose, capillary     Status: None  Collection Time: 08/24/16  8:24 AM Result Value Ref Range  Glucose-Capillary 89 65 - 99 mg/dL Blood Culture (routine x 2)     Status: Abnormal  Collection Time: 08/24/16  8:25 AM Result Value Ref Range  Specimen Description BLOOD RIGHT ANTECUBITAL   Special Requests     BOTTLES DRAWN AEROBIC AND ANAEROBIC Blood Culture adequate volume  Culture  Setup Time     GRAM NEGATIVE RODS IN BOTH AEROBIC AND ANAEROBIC BOTTLES CRITICAL RESULT CALLED TO, READ BACK BY AND VERIFIED WITH: JASON ROBBINS ON 08/24/16 AT 1933 QSD   Culture (A)    KLEBSIELLA PNEUMONIAE SUSCEPTIBILITIES PERFORMED ON PREVIOUS CULTURE WITHIN THE LAST 5 DAYS. Performed at [facility], [place] [address], [place], [place] [address]   Report Status 08/27/2016 FINAL  Blood Culture (routine x 2)     Status: Abnormal  Collection Time: 08/24/16  8:25 AM Result Value Ref Range  Specimen Description BLOOD RIGHT ANTECUBITAL   Special Requests     BOTTLES DRAWN AEROBIC AND ANAEROBIC Blood Culture adequate volume  Culture  Setup Time     GRAM NEGATIVE RODS IN BOTH AEROBIC AND ANAEROBIC BOTTLES CRITICAL RESULT CALLED TO, READ BACK BY AND VERIFIED WITH: JASON ROBBINS ON 08/24/16 AT 1933 QSD   Culture KLEBSIELLA  PNEUMONIAE (A)   Report Status 08/27/2016 FINAL   Organism ID, Bacteria KLEBSIELLA PNEUMONIAE      Susceptibility  Klebsiella pneumoniae - MIC*   AMPICILLIN >=32 RESISTANT Resistant    CEFAZOLIN <=4 SENSITIVE Sensitive    CEFEPIME <=1 SENSITIVE Sensitive    CEFTAZIDIME <=1 SENSITIVE Sensitive    CEFTRIAXONE <=1 SENSITIVE Sensitive  CIPROFLOXACIN <=0.25 SENSITIVE Sensitive    GENTAMICIN <=1 SENSITIVE Sensitive    IMIPENEM <=0.25 SENSITIVE Sensitive    TRIMETH/SULFA <=20 SENSITIVE Sensitive    AMPICILLIN/SULBACTAM 8 SENSITIVE Sensitive    PIP/TAZO <=4 SENSITIVE Sensitive    Extended ESBL NEGATIVE Sensitive    * KLEBSIELLA PNEUMONIAE Blood Culture ID Panel (Reflexed)     Status: Abnormal  Collection Time: 08/24/16  8:25 AM Result Value Ref Range  Enterococcus species NOT DETECTED NOT DETECTED  Listeria monocytogenes NOT DETECTED NOT DETECTED  Staphylococcus species NOT DETECTED NOT DETECTED  Staphylococcus aureus NOT DETECTED NOT DETECTED  Streptococcus species NOT DETECTED NOT DETECTED  Streptococcus agalactiae NOT DETECTED NOT DETECTED  Streptococcus pneumoniae NOT DETECTED NOT DETECTED  Streptococcus pyogenes NOT DETECTED NOT DETECTED  Acinetobacter baumannii NOT DETECTED NOT DETECTED  Enterobacteriaceae species DETECTED (A) NOT DETECTED   Comment: Enterobacteriaceae represent a large family of gram-negative bacteria, not a single organism. CRITICAL RESULT CALLED TO, READ BACK BY AND VERIFIED WITH: JASON ROBBINS ON 08/24/16 AT 1933 QSD   Enterobacter cloacae complex NOT DETECTED NOT DETECTED  Escherichia coli NOT DETECTED NOT DETECTED  Klebsiella oxytoca NOT DETECTED NOT DETECTED  Klebsiella pneumoniae DETECTED (A) NOT DETECTED   Comment: CRITICAL RESULT CALLED TO, READ BACK BY AND VERIFIED WITH: JASON ROBBINS ON 08/24/16 AT 1933 QSD   Proteus species NOT DETECTED NOT DETECTED  Serratia marcescens NOT DETECTED NOT DETECTED  Carbapenem resistance NOT DETECTED NOT  DETECTED  Haemophilus influenzae NOT DETECTED NOT DETECTED  Neisseria meningitidis NOT DETECTED NOT DETECTED  Pseudomonas aeruginosa NOT DETECTED NOT DETECTED  Candida albicans NOT DETECTED NOT DETECTED  Candida glabrata NOT DETECTED NOT DETECTED  Candida krusei NOT DETECTED NOT DETECTED  Candida parapsilosis NOT DETECTED NOT DETECTED  Candida tropicalis NOT DETECTED NOT DETECTED Urine culture     Status: Abnormal  Collection Time: 08/24/16  9:19 AM Result Value Ref Range  Specimen Description URINE, CATHETERIZED   Special Requests NONE   Culture >=100,000 COLONIES/mL KLEBSIELLA PNEUMONIAE (A)   Report Status 08/26/2016 FINAL   Organism ID, Bacteria KLEBSIELLA PNEUMONIAE (A)      Susceptibility  Klebsiella pneumoniae - MIC*   AMPICILLIN >=32 RESISTANT Resistant    CEFAZOLIN <=4 SENSITIVE Sensitive    CEFTRIAXONE <=1 SENSITIVE Sensitive    CIPROFLOXACIN <=0.25 SENSITIVE Sensitive    GENTAMICIN <=1 SENSITIVE Sensitive    IMIPENEM <=0.25 SENSITIVE Sensitive    NITROFURANTOIN 64 INTERMEDIATE Intermediate    TRIMETH/SULFA <=20 SENSITIVE Sensitive    AMPICILLIN/SULBACTAM 4 SENSITIVE Sensitive    PIP/TAZO <=4 SENSITIVE Sensitive    Extended ESBL NEGATIVE Sensitive    * >=100,000 COLONIES/mL KLEBSIELLA PNEUMONIAE Comprehensive metabolic panel     Status: Abnormal  Collection Time: 08/24/16  9:19 AM Result Value Ref Range  Sodium 138 135 - 145 mmol/L  Potassium 3.5 3.5 - 5.1 mmol/L  Chloride 101 101 - 111 mmol/L  CO2 17 (L) 22 - 32 mmol/L  Glucose, Bld 76 65 - 99 mg/dL  BUN 27 (H) 6 - 20 mg/dL  Creatinine, Ser 1.91 (H) 0.44 - 1.00 mg/dL  Calcium 8.7 (L) 8.9 - 10.3 mg/dL  Total Protein 6.7 6.5 - 8.1 g/dL  Albumin 3.2 (L) 3.5 - 5.0 g/dL  AST 83 (H) 15 - 41 U/L  ALT 44 14 - 54 U/L   Comment: RESULT CONFIRMED BY MANUAL DILUTION [facility]   Alkaline Phosphatase 78 38 - 126 U/L  Total Bilirubin 0.9 0.3 - 1.2 mg/dL  GFR calc non Af Amer 26 (L) >60 mL/min  GFR calc Af Amer 30 (L) >60  mL/min   Comment: (NOTE) The eGFR has been calculated using the CKD EPI equation. This calculation has not been validated in all clinical situations. eGFR's persistently <60 mL/min signify possible Chronic Kidney Disease.   Anion gap 20 (H) 5 - 15 Troponin I     Status: Abnormal  Collection Time: 08/24/16  9:19 AM Result Value Ref Range  Troponin I 0.04 (HH) <0.03 ng/mL   Comment: CRITICAL RESULT CALLED TO, READ BACK BY AND VERIFIED WITH DONALD SWEENEY ON 08/24/16 AT 0958 BY [facility]  Urinalysis, Complete w Microscopic     Status: Abnormal  Collection Time: 08/24/16 10:04 AM Result Value Ref Range  Color, Urine AMBER (A) YELLOW   Comment: BIOCHEMICALS MAY BE AFFECTED BY COLOR  APPearance HAZY (A) CLEAR  Specific Gravity, Urine 1.019 1.005 - 1.030  pH 5.0 5.0 - 8.0  Glucose, UA NEGATIVE NEGATIVE mg/dL  Hgb urine dipstick SMALL (A) NEGATIVE  Bilirubin Urine NEGATIVE NEGATIVE  Ketones, ur 5 (A) NEGATIVE mg/dL  Protein, ur 534 (A) NEGATIVE mg/dL  Nitrite POSITIVE (A) NEGATIVE  Leukocytes, UA NEGATIVE NEGATIVE  RBC / HPF 0-5 0 - 5 RBC/hpf  WBC, UA 6-30 0 - 5 WBC/hpf  Bacteria, UA MANY (A) NONE SEEN  Squamous Epithelial / LPF 0-5 (A) NONE SEEN  Mucous PRESENT   Hyaline Casts, UA PRESENT  Lactic acid, plasma     Status: Abnormal  Collection Time: 08/24/16 12:01 PM Result Value Ref Range  Lactic Acid, Venous 8.5 (HH) 0.5 - 1.9 mmol/L   Comment: CRITICAL RESULT CALLED TO, READ BACK BY AND VERIFIED WITH KIM MAIN ON 08/24/16 AT 1306 BY [facility]  Blood gas, arterial     Status: Abnormal  Collection Time: 08/24/16 12:36 PM Result Value Ref Range  FIO2 0.40   Delivery systems BILEVEL POSITIVE AIRWAY PRESSURE   LHR 12 resp/min  Inspiratory PAP 12   Expiratory PAP 5.0   pH, Arterial 7.18 (LL) 7.350 - 7.450   Comment: CRITICAL RESULT CALLED TO, READ BACK BY AND VERIFIED WITH: DR. CHEN [id number] 1255 ABL   pCO2 arterial 29 (L) 32.0 - 48.0 mmHg  pO2, Arterial 94 83.0 - 108.0  mmHg  Bicarbonate 10.8 (L) 20.0 - 28.0 mmol/L  Acid-base deficit 16.2 (H) 0.0 - 2.0 mmol/L  O2 Saturation 95.0 %  Patient temperature 37.0   Collection site REVIEWED BY   Sample type ARTERIAL DRAW   Allens test (pass/fail) PASS PASS APTT     Status: Abnormal  Collection Time: 08/24/16  1:17 PM Result Value Ref Range  aPTT 64 (H) 24 - 36 seconds   Comment:        IF BASELINE aPTT IS ELEVATED, SUGGEST PATIENT RISK ASSESSMENT BE USED TO DETERMINE APPROPRIATE ANTICOAGULANT THERAPY.  Protime-INR     Status: Abnormal  Collection Time: 08/24/16  1:17 PM Result Value Ref Range  Prothrombin Time 23.4 (H) 11.4 - 15.2 seconds  INR 2.05  Blood gas, arterial     Status: Abnormal  Collection Time: 08/24/16  3:29 PM Result Value Ref Range  FIO2 0.80   Delivery systems VENTILATOR   Mode PRESSURE REGULATED VOLUME CONTROL   VT 500 mL  LHR 20 resp/min  Peep/cpap 5.0 cm H20  pH, Arterial 7.07 (LL) 7.350 - 7.450   Comment: CRITICAL RESULT CALLED TO, READ BACK BY AND VERIFIED WITH: DR. Chales Abrahams [id number] 1415 ABL   pCO2 arterial 30 (L) 32.0 - 48.0 mmHg  pO2, Arterial 81 (L) 83.0 -  108.0 mmHg  Bicarbonate 8.7 (L) 20.0 - 28.0 mmol/L  Acid-base deficit 20.2 (H) 0.0 - 2.0 mmol/L  O2 Saturation 89.5 %  Patient temperature 37.0   Sample type ARTERIAL DRAW   Allens test (pass/fail) PASS PASS Prepare fresh frozen plasma     Status: None  Collection Time: 08/24/16  3:30 PM Result Value Ref Range  Unit Number [id number]   Blood Component Type THWPLS APHR2   Unit division 00   Status of Unit ISSUED,FINAL   Transfusion Status OK TO TRANSFUSE  Body fluid culture     Status: None  Collection Time: 08/24/16  3:30 PM Result Value Ref Range  Specimen Description FLUID   Special Requests LEFT NEPHROSTOMY   Gram Stain     WBC PRESENT, PREDOMINANTLY PMN GRAM NEGATIVE RODS CYTOSPIN SMEAR Performed at [facility], [address] [place], [place] [address]   Culture FEW KLEBSIELLA  PNEUMONIAE   Report Status 08/28/2016 FINAL   Organism ID, Bacteria KLEBSIELLA PNEUMONIAE      Susceptibility  Klebsiella pneumoniae - MIC*   AMPICILLIN >=32 RESISTANT Resistant    CEFAZOLIN <=4 SENSITIVE Sensitive    CEFEPIME <=1 SENSITIVE Sensitive    CEFTAZIDIME <=1 SENSITIVE Sensitive    CEFTRIAXONE <=1 SENSITIVE Sensitive    CIPROFLOXACIN <=0.25 SENSITIVE Sensitive    GENTAMICIN <=1 SENSITIVE Sensitive    IMIPENEM <=0.25 SENSITIVE Sensitive    TRIMETH/SULFA <=20 SENSITIVE Sensitive    AMPICILLIN/SULBACTAM 8 SENSITIVE Sensitive    PIP/TAZO <=4 SENSITIVE Sensitive    Extended ESBL NEGATIVE Sensitive    * FEW KLEBSIELLA PNEUMONIAE BPAM FFP     Status: None  Collection Time: 08/24/16  3:30 PM Result Value Ref Range  ISSUE DATE / TIME [id number]   Blood Product Unit Number [id number]   PRODUCT CODE [id number]   Unit Type and Rh 2800   Blood Product Expiration Date [id number]  Glucose, capillary     Status: Abnormal  Collection Time: 08/24/16  3:42 PM Result Value Ref Range  Glucose-Capillary 55 (L) 65 - 99 mg/dL MRSA PCR Screening     Status: None  Collection Time: 08/24/16  3:44 PM Result Value Ref Range  MRSA by PCR NEGATIVE NEGATIVE   Comment:        The GeneXpert MRSA Assay (FDA approved for NASAL specimens only), is one component of a comprehensive MRSA colonization surveillance program. It is not intended to diagnose MRSA infection nor to guide or monitor treatment for MRSA infections.  Troponin I     Status: Abnormal  Collection Time: 08/24/16  3:44 PM Result Value Ref Range  Troponin I 0.13 (HH) <0.03 ng/mL   Comment: CRITICAL VALUE NOTED. VALUE IS CONSISTENT WITH PREVIOUSLY REPORTED/CALLED VALUE JJB Protime-INR     Status: Abnormal  Collection Time: 08/24/16  3:44 PM Result Value Ref Range  Prothrombin Time 25.0 (H) 11.4 - 15.2 seconds  INR 2.22  Procalcitonin     Status: None  Collection Time: 08/24/16  3:44 PM Result Value Ref  Range  Procalcitonin >150.00 ng/mL   Comment:        Interpretation: PCT >= 10 ng/mL: Important systemic inflammatory response, almost exclusively due to severe bacterial sepsis or septic shock. (NOTE)         ICU PCT Algorithm               Non ICU PCT Algorithm    ----------------------------     ------------------------------         PCT < 0.25 ng/mL  PCT < 0.1 ng/mL     Stopping of antibiotics            Stopping of antibiotics       strongly encouraged.               strongly encouraged.    ----------------------------     ------------------------------       PCT level decrease by               PCT < 0.25 ng/mL       >= 80% from peak PCT       OR PCT 0.25 - 0.5 ng/mL          Stopping of antibiotics                                             encouraged.     Stopping of antibiotics           encouraged.    ----------------------------     ------------------------------       PCT level decrease by              PCT >= 0.25 ng/mL       < 80% from peak PCT        AND PCT >= 0.5 ng/mL             Continuing antibiotics                                              encouraged.       Continuing antibiotics            encouraged.    ----------------------------     ------------------------------     PCT level increase compared          PCT > 0.5 ng/mL         with peak PCT AND          PCT >= 0.5 ng/mL             Escalation of antibiotics                                          strongly encouraged.      Escalation of antibiotics        strongly encouraged.  APTT     Status: Abnormal  Collection Time: 08/24/16  3:44 PM Result Value Ref Range  aPTT 68 (H) 24 - 36 seconds   Comment:        IF BASELINE aPTT IS ELEVATED, SUGGEST PATIENT RISK ASSESSMENT BE USED TO DETERMINE APPROPRIATE ANTICOAGULANT THERAPY.  Glucose, capillary     Status: Abnormal  Collection Time: 08/24/16  3:45 PM Result Value Ref Range  Glucose-Capillary 41 (LL) 65 - 99 mg/dL Vancomycin,  random     Status: None  Collection Time: 08/24/16  4:00 PM Result Value Ref Range  Vancomycin Rm 15    Comment:        Random Vancomycin therapeutic range is dependent on dosage and time of specimen collection. A peak range is 20.0-40.0 ug/mL A trough range is 5.0-15.0 ug/mL  CBC with Differential/Platelet     Status: Abnormal  Collection Time: 08/24/16  4:00 PM Result Value Ref Range  WBC 23.4 (H) 3.6 - 11.0 K/uL  RBC 3.77 (L) 3.80 - 5.20 MIL/uL  Hemoglobin 11.1 (L) 12.0 - 16.0 g/dL  HCT 37.0 (L) 23.0 - 17.2 %  MCV 91.9 80.0 - 100.0 fL  MCH 29.3 26.0 - 34.0 pg  MCHC 31.9 (L) 32.0 - 36.0 g/dL  RDW 09.1 (H) 06.8 - 16.6 %  Platelets 69 (L) 150 - 440 K/uL  Neutrophils Relative % 61 %  Lymphocytes Relative 7 %  Monocytes Relative 2 %  Eosinophils Relative 0 %  Basophils Relative 0 %  Band Neutrophils 13 %  Metamyelocytes Relative 17 %  Myelocytes 0 %  Promyelocytes Absolute 0 %  Blasts 0 %  nRBC 0 0 /100 WBC  Other 0 %  Neutro Abs 21.3 (H) 1.4 - 6.5 K/uL  Lymphs Abs 1.6 1.0 - 3.6 K/uL  Monocytes Absolute 0.5 0.2 - 0.9 K/uL  Eosinophils Absolute 0.0 0 - 0.7 K/uL  Basophils Absolute 0.0 0 - 0.1 K/uL  RBC Morphology MIXED RBC POPULATION   WBC Morphology VACUOLATED NEUTROPHILS  Troponin I (q 6hr x 3)     Status: Abnormal  Collection Time: 08/24/16  4:00 PM Result Value Ref Range  Troponin I 0.13 (HH) <0.03 ng/mL   Comment: CRITICAL VALUE NOTED. VALUE IS CONSISTENT WITH PREVIOUSLY REPORTED/CALLED VALUE  JJB Creatinine, serum     Status: Abnormal  Collection Time: 08/24/16  4:00 PM Result Value Ref Range  Creatinine, Ser 2.57 (H) 0.44 - 1.00 mg/dL  GFR calc non Af Amer 18 (L) >60 mL/min  GFR calc Af Amer 21 (L) >60 mL/min   Comment: (NOTE) The eGFR has been calculated using the CKD EPI equation. This calculation has not been validated in all clinical situations. eGFR's persistently <60 mL/min signify possible Chronic Kidney Disease.  Calcium, ionized      Status: None  Collection Time: 08/24/16  4:14 PM Result Value Ref Range  Calcium, Ionized, Serum LIP1 mg/dL   Comment: (NOTE) Test Not Performed.  Specimen is lipemic.      Notified Alona Bene T. 08/29/2016 Performed At: [facility] [address] [place], [place] [id number] Mila Homer MD UC:[id number]  Cortisol     Status: None  Collection Time: 08/24/16  4:42 PM Result Value Ref Range  Cortisol, Plasma 35.9 ug/dL   Comment: (NOTE) AM    6.7 - 22.6 ug/dL PM   <80.6       ug/dL Performed at [facility], [address] [place], [place] [address]  Type and screen [facility]     Status: None  Collection Time: 08/24/16  4:42 PM Result Value Ref Range  ABO/RH(D) O POS   Antibody Screen NEG   Sample Expiration 08/27/2016  Glucose, capillary     Status: Abnormal  Collection Time: 08/24/16  6:49 PM Result Value Ref Range  Glucose-Capillary 165 (H) 65 - 99 mg/dL Triglycerides     Status: Abnormal  Collection Time: 08/24/16  9:15 PM Result Value Ref Range  Triglycerides 505 (H) <150 mg/dL Comprehensive metabolic panel     Status: Abnormal  Collection Time: 08/24/16  9:15 PM Result Value Ref Range  Sodium 134 (L) 135 - 145 mmol/L   Comment: ELECTROLYTES REPEATED.PMH  Potassium 3.7 3.5 - 5.1 mmol/L  Chloride 98 (L) 101 - 111 mmol/L  CO2 12 (L) 22 - 32 mmol/L  Glucose,  Bld 274 (H) 65 - 99 mg/dL  BUN 35 (H) 6 - 20 mg/dL  Creatinine, Ser 2.62 (H) 0.44 - 1.00 mg/dL  Calcium 7.1 (L) 8.9 - 10.3 mg/dL  Total Protein 5.8 (L) 6.5 - 8.1 g/dL  Albumin 2.7 (L) 3.5 - 5.0 g/dL  AST 94 (H) 15 - 41 U/L  ALT 43 14 - 54 U/L   Comment: RESULT CONFIRMED BY MANUAL DILUTION.PMH  Alkaline Phosphatase 79 38 - 126 U/L  Total Bilirubin 1.0 0.3 - 1.2 mg/dL  GFR calc non Af Amer 18 (L) >60 mL/min  GFR calc Af Amer 20 (L) >60 mL/min   Comment: (NOTE) The eGFR has been calculated using the CKD EPI equation. This calculation has not been validated in all  clinical situations. eGFR's persistently <60 mL/min signify possible Chronic Kidney Disease.   Anion gap 24 (H) 5 - 15 Troponin I (q 6hr x 3)     Status: Abnormal  Collection Time: 08/24/16  9:16 PM Result Value Ref Range  Troponin I 0.77 (HH) <0.03 ng/mL   Comment: CRITICAL VALUE NOTED. VALUE IS CONSISTENT WITH PREVIOUSLY REPORTED/CALLED VALUE.PMH APTT     Status: Abnormal  Collection Time: 08/24/16  9:17 PM Result Value Ref Range  aPTT 52 (H) 24 - 36 seconds   Comment:        IF BASELINE aPTT IS ELEVATED, SUGGEST PATIENT RISK ASSESSMENT BE USED TO DETERMINE APPROPRIATE ANTICOAGULANT THERAPY.  Lactic acid, plasma     Status: Abnormal  Collection Time: 08/24/16  9:17 PM Result Value Ref Range  Lactic Acid, Venous 14.0 (HH) 0.5 - 1.9 mmol/L   Comment: RESULT CONFIRMED BY MANUAL DILUTION.PMH CRITICAL RESULT CALLED TO, READ BACK BY AND VERIFIED WITH TANYA SILVA AT 2238 08/24/16.PMH  Fibrin derivatives D-Dimer ([facility] only)     Status: None  Collection Time: 08/24/16  9:18 PM Result Value Ref Range  Fibrin derivatives D-dimer ([facility]) >7500 0.00 - 499.00   Comment: (NOTE) <> Exclusion of Venous Thromboembolism (VTE) - OUTPATIENT ONLY   (Emergency Department or [place])   0-499 ng/ml (FEU): With a low to intermediate pretest probability                      for VTE this test result excludes the diagnosis                      of VTE.   >499 ng/ml (FEU) : VTE not excluded; additional work up for VTE is                      required. <> Testing on Inpatients and Evaluation of Disseminated Intravascular   Coagulation (DIC) Reference Range:   0-499 ng/ml (FEU)  Fibrinogen     Status: None  Collection Time: 08/24/16  9:18 PM Result Value Ref Range  Fibrinogen 253 210 - 475 mg/dL Protime-INR     Status: Abnormal  Collection Time: 08/24/16  9:18 PM Result Value Ref Range  Prothrombin Time 24.5 (H) 11.4 - 15.2 seconds  INR 2.17  Phosphorus     Status: Abnormal  Collection Time:  08/24/16  9:18 PM Result Value Ref Range  Phosphorus 6.5 (H) 2.5 - 4.6 mg/dL CBC     Status: Abnormal  Collection Time: 08/24/16  9:18 PM Result Value Ref Range  WBC 34.1 (H) 3.6 - 11.0 K/uL  RBC 3.96 3.80 - 5.20 MIL/uL  Hemoglobin 11.9 (L) 12.0 - 16.0 g/dL  HCT 36.1 35.0 -  47.0 %  MCV 91.3 80.0 - 100.0 fL  MCH 30.1 26.0 - 34.0 pg  MCHC 32.9 32.0 - 36.0 g/dL  RDW 17.3 (H) 11.5 - 14.5 %  Platelets 54 (L) 150 - 440 K/uL Magnesium     Status: Abnormal  Collection Time: 08/24/16  9:18 PM Result Value Ref Range  Magnesium 1.1 (L) 1.7 - 2.4 mg/dL Glucose, capillary     Status: Abnormal  Collection Time: 08/24/16  9:42 PM Result Value Ref Range  Glucose-Capillary 242 (H) 65 - 99 mg/dL Blood gas, arterial     Status: Abnormal  Collection Time: 08/24/16 10:13 PM Result Value Ref Range  FIO2 0.70   Delivery systems VENTILATOR   Mode PRESSURE REGULATED VOLUME CONTROL   VT 500 mL  LHR 20 resp/min  Peep/cpap 5.0 cm H20  pH, Arterial 7.14 (LL) 7.350 - 7.450   Comment: RBV, TUKOV, M, CMM, 2225,  pCO2 arterial 28 (L) 32.0 - 48.0 mmHg  pO2, Arterial 101 83.0 - 108.0 mmHg  Bicarbonate 9.5 (L) 20.0 - 28.0 mmol/L  Acid-base deficit 18.1 (H) 0.0 - 2.0 mmol/L  O2 Saturation 95.5 %  Patient temperature 37.0   Collection site A-LINE   Sample type ARTERIAL DRAW  Glucose, capillary     Status: Abnormal  Collection Time: 08/25/16 12:17 AM Result Value Ref Range  Glucose-Capillary 196 (H) 65 - 99 mg/dL Blood gas, arterial     Status: Abnormal  Collection Time: 08/25/16 12:46 AM Result Value Ref Range  Delivery systems VENTILATOR   Mode PRESSURE REGULATED VOLUME CONTROL   VT 500 mL  Peep/cpap 5.0 cm H20  pH, Arterial 7.23 (L) 7.350 - 7.450  pCO2 arterial 31 (L) 32.0 - 48.0 mmHg  pO2, Arterial 94 83.0 - 108.0 mmHg  Bicarbonate 13.0 (L) 20.0 - 28.0 mmol/L  Acid-base deficit 13.3 (H) 0.0 - 2.0 mmol/L  O2 Saturation 95.7 %  Patient temperature 37.0   Collection site A-LINE   Sample  type ARTERIAL DRAW  Glucose, capillary     Status: Abnormal  Collection Time: 08/25/16  4:28 AM Result Value Ref Range  Glucose-Capillary 268 (H) 65 - 99 mg/dL Troponin I (q 6hr x 3)     Status: Abnormal  Collection Time: 08/25/16  5:08 AM Result Value Ref Range  Troponin I 1.25 (HH) <0.03 ng/mL   Comment: CRITICAL VALUE NOTED. VALUE IS CONSISTENT WITH PREVIOUSLY REPORTED/CALLED VALUE.PMH Comprehensive metabolic panel     Status: Abnormal  Collection Time: 08/25/16  5:08 AM Result Value Ref Range  Sodium 140 135 - 145 mmol/L   Comment: ELECTROLYTES REPEATED.PMH RESULTS OBTAINED BY ULTRACENTRIFUGATION.PMH   Potassium 3.7 3.5 - 5.1 mmol/L  Chloride 95 (L) 101 - 111 mmol/L  CO2 18 (L) 22 - 32 mmol/L  Glucose, Bld 284 (H) 65 - 99 mg/dL  BUN 37 (H) 6 - 20 mg/dL  Creatinine, Ser 2.96 (H) 0.44 - 1.00 mg/dL  Calcium 6.2 (LL) 8.9 - 10.3 mg/dL   Comment: CRITICAL RESULT CALLED TO, READ BACK BY AND VERIFIED WITH BRITTON LEE RUST CHESTER AT 1610 08/25/16.PMH  Total Protein 4.8 (L) 6.5 - 8.1 g/dL   Comment: RESULTS OBTAINED BY ULTRACENTRIFUGATION.PMH  Albumin 2.3 (L) 3.5 - 5.0 g/dL  AST 139 (H) 15 - 41 U/L  ALT 48 14 - 54 U/L   Comment: RESULT CONFIRMED BY MANUAL DILUTION.PMH  Alkaline Phosphatase 60 38 - 126 U/L  Total Bilirubin 0.9 0.3 - 1.2 mg/dL  GFR calc non Af Amer 15 (L) >60 mL/min  GFR  calc Af Amer 18 (L) >60 mL/min   Comment: (NOTE) The eGFR has been calculated using the CKD EPI equation. This calculation has not been validated in all clinical situations. eGFR's persistently <60 mL/min signify possible Chronic Kidney Disease.   Anion gap 27 (H) 5 - 15 Magnesium     Status: None  Collection Time: 08/25/16  5:08 AM Result Value Ref Range  Magnesium 2.3 1.7 - 2.4 mg/dL   Comment: LIPEMIC SPECIMEN, RESULTS MAY BE AFFECTED. Phosphorus     Status: Abnormal  Collection Time: 08/25/16  5:08 AM Result Value Ref Range  Phosphorus 6.4 (H) 2.5 - 4.6 mg/dL CBC with  Differential/Platelet     Status: Abnormal  Collection Time: 08/25/16  5:08 AM Result Value Ref Range  WBC 32.2 (H) 3.6 - 11.0 K/uL  RBC 3.71 (L) 3.80 - 5.20 MIL/uL  Hemoglobin 11.3 (L) 12.0 - 16.0 g/dL  HCT 32.9 (L) 35.0 - 47.0 %  MCV 88.6 80.0 - 100.0 fL  MCH 30.4 26.0 - 34.0 pg  MCHC 34.3 32.0 - 36.0 g/dL  RDW 16.7 (H) 11.5 - 14.5 %  Platelets 32 (L) 150 - 440 K/uL  Neutrophils Relative % 49 %  Lymphocytes Relative 11 %  Monocytes Relative 5 %  Eosinophils Relative 0 %  Basophils Relative 0 %  Band Neutrophils 17 %  Metamyelocytes Relative 18 %  Myelocytes 0 %  Promyelocytes Absolute 0 %  Blasts 0 %  nRBC 1 (H) 0 /100 WBC  Other 0 %  Neutro Abs 27.1 (H) 1.4 - 6.5 K/uL  Lymphs Abs 3.5 1.0 - 3.6 K/uL  Monocytes Absolute 1.6 (H) 0.2 - 0.9 K/uL  Eosinophils Absolute 0.0 0 - 0.7 K/uL  Basophils Absolute 0.0 0 - 0.1 K/uL  RBC Morphology MIXED RBC POPULATION   WBC Morphology VACUOLATED NEUTROPHILS  Blood gas, arterial     Status: Abnormal  Collection Time: 08/25/16  5:08 AM Result Value Ref Range  FIO2 0.70   Delivery systems VENTILATOR   Mode PRESSURE REGULATED VOLUME CONTROL   VT 500 mL  LHR 20 resp/min  Peep/cpap 5.0 cm H20  pH, Arterial 7.35 7.350 - 7.450  pCO2 arterial 35 32.0 - 48.0 mmHg  pO2, Arterial 75 (L) 83.0 - 108.0 mmHg  Bicarbonate 19.3 (L) 20.0 - 28.0 mmol/L  Acid-base deficit 5.6 (H) 0.0 - 2.0 mmol/L  O2 Saturation 94.1 %  Patient temperature 37.0   Collection site A-LINE   Sample type ARTERIAL DRAW  Glucose, capillary     Status: Abnormal  Collection Time: 08/25/16  5:11 AM Result Value Ref Range  Glucose-Capillary 271 (H) 65 - 99 mg/dL Lactic acid, plasma     Status: Abnormal  Collection Time: 08/25/16  5:15 AM Result Value Ref Range  Lactic Acid, Venous 14.7 (HH) 0.5 - 1.9 mmol/L   Comment: RESULT CONFIRMED BY MANUAL DILUTION.PMH CRITICAL RESULT CALLED TO, READ BACK BY AND VERIFIED WITH BRITTON LEE RUST CHESTER AT [phone] 08/25/16.PMH  Glucose,  capillary     Status: Abnormal  Collection Time: 08/25/16  6:05 AM Result Value Ref Range  Glucose-Capillary 176 (H) 65 - 99 mg/dL Glucose, capillary     Status: Abnormal  Collection Time: 08/25/16  6:59 AM Result Value Ref Range  Glucose-Capillary 205 (H) 65 - 99 mg/dL Culture, respiratory (NON-Expectorated)     Status: None  Collection Time: 08/25/16  7:00 AM Result Value Ref Range  Specimen Description TRACHEAL ASPIRATE   Special Requests NONE   Gram Stain  FEW WBC PRESENT, PREDOMINANTLY PMN FEW SQUAMOUS EPITHELIAL CELLS PRESENT NO ORGANISMS SEEN   Culture     Consistent with normal respiratory flora. Performed at [facility], [place] [address], [place], [place] [address]   Report Status 08/27/2016 FINAL  Glucose, capillary     Status: Abnormal  Collection Time: 08/25/16  8:05 AM Result Value Ref Range  Glucose-Capillary 193 (H) 65 - 99 mg/dL Lactic acid, plasma     Status: Abnormal  Collection Time: 08/25/16  8:47 AM Result Value Ref Range  Lactic Acid, Venous 15.2 (HH) 0.5 - 1.9 mmol/L   Comment: RESULT CONFIRMED BY MANUAL DILUTION ALV CRITICAL RESULT CALLED TO, READ BACK BY AND VERIFIED WITH RACHEL VERDI AT 1020 ON 08/25/16 ALV  Protime-INR     Status: Abnormal  Collection Time: 08/25/16  8:47 AM Result Value Ref Range  Prothrombin Time 24.8 (H) 11.4 - 15.2 seconds  INR 2.20  Glucose, capillary     Status: Abnormal  Collection Time: 08/25/16  9:09 AM Result Value Ref Range  Glucose-Capillary 231 (H) 65 - 99 mg/dL Glucose, capillary     Status: Abnormal  Collection Time: 08/25/16 10:15 AM Result Value Ref Range  Glucose-Capillary 235 (H) 65 - 99 mg/dL Glucose, capillary     Status: Abnormal  Collection Time: 08/25/16 11:20 AM Result Value Ref Range  Glucose-Capillary 221 (H) 65 - 99 mg/dL ECHOCARDIOGRAM COMPLETE     Status: None  Collection Time: 08/25/16 11:31 AM Result Value Ref Range  Weight 3,160.51 oz  Height 68 in  BP 96/64 mmHg Glucose,  capillary     Status: Abnormal  Collection Time: 08/25/16 12:26 PM Result Value Ref Range  Glucose-Capillary 202 (H) 65 - 99 mg/dL Glucose, capillary     Status: Abnormal  Collection Time: 08/25/16  1:32 PM Result Value Ref Range  Glucose-Capillary 190 (H) 65 - 99 mg/dL Glucose, capillary     Status: Abnormal  Collection Time: 08/25/16  2:35 PM Result Value Ref Range  Glucose-Capillary 195 (H) 65 - 99 mg/dL Glucose, capillary     Status: Abnormal  Collection Time: 08/25/16  3:38 PM Result Value Ref Range  Glucose-Capillary 190 (H) 65 - 99 mg/dL Renal function     Status: Abnormal  Collection Time: 08/25/16  4:09 PM Result Value Ref Range  Sodium 142 135 - 145 mmol/L  Potassium 3.1 (L) 3.5 - 5.1 mmol/L  Chloride 98 (L) 101 - 111 mmol/L  CO2 29 22 - 32 mmol/L  Glucose, Bld 183 (H) 65 - 99 mg/dL  BUN 38 (H) 6 - 20 mg/dL  Creatinine, Ser 2.82 (H) 0.44 - 1.00 mg/dL  Calcium 6.1 (LL) 8.9 - 10.3 mg/dL   Comment: CRITICAL RESULT CALLED TO, READ BACK BY AND VERIFIED WITH RACHEL VERDI AT 1727 ON 08/25/16 BY SNJ   Phosphorus 4.0 2.5 - 4.6 mg/dL  Albumin 2.3 (L) 3.5 - 5.0 g/dL  GFR calc non Af Amer 16 (L) >60 mL/min  GFR calc Af Amer 19 (L) >60 mL/min   Comment: (NOTE) The eGFR has been calculated using the CKD EPI equation. This calculation has not been validated in all clinical situations. eGFR's persistently <60 mL/min signify possible Chronic Kidney Disease.   Anion gap 15 5 - 15 Magnesium     Status: None  Collection Time: 08/25/16  4:09 PM Result Value Ref Range  Magnesium 2.0 1.7 - 2.4 mg/dL Glucose, capillary     Status: Abnormal  Collection Time: 08/25/16  4:23 PM Result Value Ref  Range  Glucose-Capillary 175 (H) 65 - 99 mg/dL Glucose, capillary     Status: Abnormal  Collection Time: 08/25/16  5:26 PM Result Value Ref Range  Glucose-Capillary 166 (H) 65 - 99 mg/dL Glucose, capillary     Status: Abnormal  Collection Time: 08/25/16  6:30 PM Result Value Ref  Range  Glucose-Capillary 138 (H) 65 - 99 mg/dL Glucose, capillary     Status: Abnormal  Collection Time: 08/25/16  7:41 PM Result Value Ref Range  Glucose-Capillary 171 (H) 65 - 99 mg/dL Glucose, capillary     Status: Abnormal  Collection Time: 08/25/16  8:46 PM Result Value Ref Range  Glucose-Capillary 131 (H) 65 - 99 mg/dL Renal function     Status: Abnormal  Collection Time: 08/25/16  9:13 PM Result Value Ref Range  Sodium 141 135 - 145 mmol/L  Potassium 3.5 3.5 - 5.1 mmol/L  Chloride 99 (L) 101 - 111 mmol/L  CO2 29 22 - 32 mmol/L  Glucose, Bld 153 (H) 65 - 99 mg/dL  BUN 35 (H) 6 - 20 mg/dL  Creatinine, Ser 2.63 (H) 0.44 - 1.00 mg/dL  Calcium 6.7 (L) 8.9 - 10.3 mg/dL  Phosphorus 3.8 2.5 - 4.6 mg/dL  Albumin 2.4 (L) 3.5 - 5.0 g/dL  GFR calc non Af Amer 18 (L) >60 mL/min  GFR calc Af Amer 20 (L) >60 mL/min   Comment: (NOTE) The eGFR has been calculated using the CKD EPI equation. This calculation has not been validated in all clinical situations. eGFR's persistently <60 mL/min signify possible Chronic Kidney Disease.   Anion gap 13 5 - 15 Magnesium     Status: None  Collection Time: 08/25/16  9:13 PM Result Value Ref Range  Magnesium 1.9 1.7 - 2.4 mg/dL Glucose, capillary     Status: Abnormal  Collection Time: 08/25/16  9:24 PM Result Value Ref Range  Glucose-Capillary 149 (H) 65 - 99 mg/dL Glucose, capillary     Status: Abnormal  Collection Time: 08/25/16 10:29 PM Result Value Ref Range  Glucose-Capillary 155 (H) 65 - 99 mg/dL Glucose, capillary     Status: Abnormal  Collection Time: 08/25/16 11:39 PM Result Value Ref Range  Glucose-Capillary 107 (H) 65 - 99 mg/dL Glucose, capillary     Status: Abnormal  Collection Time: 08/26/16 12:45 AM Result Value Ref Range  Glucose-Capillary 145 (H) 65 - 99 mg/dL Glucose, capillary     Status: Abnormal  Collection Time: 08/26/16  1:50 AM Result Value Ref Range  Glucose-Capillary 304 (H) 65 - 99 mg/dL Glucose, capillary      Status: Abnormal  Collection Time: 08/26/16  2:02 AM Result Value Ref Range  Glucose-Capillary 168 (H) 65 - 99 mg/dL Renal function     Status: Abnormal  Collection Time: 08/26/16  2:30 AM Result Value Ref Range  Sodium 140 135 - 145 mmol/L  Potassium 3.7 3.5 - 5.1 mmol/L  Chloride 100 (L) 101 - 111 mmol/L  CO2 27 22 - 32 mmol/L  Glucose, Bld 178 (H) 65 - 99 mg/dL  BUN 34 (H) 6 - 20 mg/dL  Creatinine, Ser 2.41 (H) 0.44 - 1.00 mg/dL  Calcium 6.9 (L) 8.9 - 10.3 mg/dL  Phosphorus 3.8 2.5 - 4.6 mg/dL  Albumin 2.3 (L) 3.5 - 5.0 g/dL  GFR calc non Af Amer 20 (L) >60 mL/min  GFR calc Af Amer 23 (L) >60 mL/min   Comment: (NOTE) The eGFR has been calculated using the CKD EPI equation. This calculation has not been validated in all clinical situations.  eGFR's persistently <60 mL/min signify possible Chronic Kidney Disease.   Anion gap 13 5 - 15 Magnesium     Status: None  Collection Time: 08/26/16  2:30 AM Result Value Ref Range  Magnesium 1.8 1.7 - 2.4 mg/dL Glucose, capillary     Status: Abnormal  Collection Time: 08/26/16  3:07 AM Result Value Ref Range  Glucose-Capillary 166 (H) 65 - 99 mg/dL Glucose, capillary     Status: Abnormal  Collection Time: 08/26/16  4:17 AM Result Value Ref Range  Glucose-Capillary 189 (H) 65 - 99 mg/dL Glucose, capillary     Status: Abnormal  Collection Time: 08/26/16  5:26 AM Result Value Ref Range  Glucose-Capillary 160 (H) 65 - 99 mg/dL Phosphorus     Status: None  Collection Time: 08/26/16  5:32 AM Result Value Ref Range  Phosphorus 3.6 2.5 - 4.6 mg/dL CBC with Differential/Platelet     Status: Abnormal  Collection Time: 08/26/16  5:32 AM Result Value Ref Range  WBC 39.4 (H) 3.6 - 11.0 K/uL  RBC 3.66 (L) 3.80 - 5.20 MIL/uL  Hemoglobin 10.6 (L) 12.0 - 16.0 g/dL  HCT 32.0 (L) 35.0 - 47.0 %  MCV 87.3 80.0 - 100.0 fL  MCH 28.9 26.0 - 34.0 pg  MCHC 33.2 32.0 - 36.0 g/dL  RDW 16.5 (H) 11.5 - 14.5 %  Platelets 18 (LL) 150 - 440  K/uL   Comment: PLATELET COUNT CONFIRMED BY SMEAR CRITICAL RESULT CALLED TO, READ BACK BY AND VERIFIED WITH: CHELSIE WRENN ON 08/26/16 AT 0641 BY JAG   Neutrophils Relative % 64 %  Lymphocytes Relative 3 %  Monocytes Relative 8 %  Eosinophils Relative 0 %  Basophils Relative 0 %  Band Neutrophils 17 %  Metamyelocytes Relative 7 %  Myelocytes 1 %  Promyelocytes Absolute 0 %  Blasts 0 %  nRBC 0 0 /100 WBC  Other 0 %  Neutro Abs 35.0 (H) 1.4 - 6.5 K/uL  Lymphs Abs 1.2 1.0 - 3.6 K/uL  Monocytes Absolute 3.2 (H) 0.2 - 0.9 K/uL  Eosinophils Absolute 0.0 0 - 0.7 K/uL  Basophils Absolute 0.0 0 - 0.1 K/uL  Smear Review MORPHOLOGY UNREMARKABLE  Basic metabolic panel     Status: Abnormal  Collection Time: 08/26/16  5:32 AM Result Value Ref Range  Sodium 140 135 - 145 mmol/L  Potassium 3.7 3.5 - 5.1 mmol/L  Chloride 101 101 - 111 mmol/L  CO2 28 22 - 32 mmol/L  Glucose, Bld 164 (H) 65 - 99 mg/dL  BUN 34 (H) 6 - 20 mg/dL  Creatinine, Ser 2.38 (H) 0.44 - 1.00 mg/dL  Calcium 7.1 (L) 8.9 - 10.3 mg/dL  GFR calc non Af Amer 20 (L) >60 mL/min  GFR calc Af Amer 23 (L) >60 mL/min   Comment: (NOTE) The eGFR has been calculated using the CKD EPI equation. This calculation has not been validated in all clinical situations. eGFR's persistently <60 mL/min signify possible Chronic Kidney Disease.   Anion gap 11 5 - 15 APTT     Status: Abnormal  Collection Time: 08/26/16  5:32 AM Result Value Ref Range  aPTT 40 (H) 24 - 36 seconds   Comment:        IF BASELINE aPTT IS ELEVATED, SUGGEST PATIENT RISK ASSESSMENT BE USED TO DETERMINE APPROPRIATE ANTICOAGULANT THERAPY.  Renal function     Status: Abnormal  Collection Time: 08/26/16  5:32 AM Result Value Ref Range  Sodium 140 135 - 145 mmol/L  Potassium 3.7 3.5 - 5.1 mmol/L  Chloride 101 101 - 111 mmol/L  CO2 29 22 - 32 mmol/L  Glucose, Bld 165 (H) 65 - 99 mg/dL  BUN 35 (H) 6 - 20 mg/dL  Creatinine, Ser 2.40 (H) 0.44 - 1.00  mg/dL  Calcium 7.1 (L) 8.9 - 10.3 mg/dL  Phosphorus 3.6 2.5 - 4.6 mg/dL  Albumin 2.3 (L) 3.5 - 5.0 g/dL  GFR calc non Af Amer 20 (L) >60 mL/min  GFR calc Af Amer 23 (L) >60 mL/min   Comment: (NOTE) The eGFR has been calculated using the CKD EPI equation. This calculation has not been validated in all clinical situations. eGFR's persistently <60 mL/min signify possible Chronic Kidney Disease.   Anion gap 10 5 - 15 Magnesium     Status: None  Collection Time: 08/26/16  5:32 AM Result Value Ref Range  Magnesium 1.7 1.7 - 2.4 mg/dL Fibrin derivatives D-Dimer ([facility] only)     Status: Abnormal  Collection Time: 08/26/16  5:32 AM Result Value Ref Range  Fibrin derivatives D-dimer ([facility]) >7,500.00 (H) 0.00 - 499.00   Comment: (NOTE) <> Exclusion of Venous Thromboembolism (VTE) - OUTPATIENT ONLY   (Emergency Department or [place])   0-499 ng/ml (FEU): With a low to intermediate pretest probability                      for VTE this test result excludes the diagnosis                      of VTE.   >499 ng/ml (FEU) : VTE not excluded; additional work up for VTE is                      required. <> Testing on Inpatients and Evaluation of Disseminated Intravascular   Coagulation (DIC) Reference Range:   0-499 ng/ml (FEU)  Fibrinogen     Status: Abnormal  Collection Time: 08/26/16  5:32 AM Result Value Ref Range  Fibrinogen 500 (H) 210 - 475 mg/dL Protime-INR     Status: Abnormal  Collection Time: 08/26/16  5:32 AM Result Value Ref Range  Prothrombin Time 17.7 (H) 11.4 - 15.2 seconds  INR 1.44  Glucose, capillary     Status: Abnormal  Collection Time: 08/26/16  6:31 AM Result Value Ref Range  Glucose-Capillary 146 (H) 65 - 99 mg/dL Glucose, capillary     Status: Abnormal  Collection Time: 08/26/16  7:13 AM Result Value Ref Range  Glucose-Capillary 25 (LL) 65 - 99 mg/dL   Comment: QUESTIONABLE RESULTS - CHARGE CREDITED REPEATED TO VERIFY Performed at [facility], [address], [place], [place] [address]   Comment 1 Repeat Test  Glucose, capillary     Status: Abnormal  Collection Time: 08/26/16  7:15 AM Result Value Ref Range  Glucose-Capillary 63 (L) 65 - 99 mg/dL Glucose, capillary     Status: Abnormal  Collection Time: 08/26/16  7:17 AM Result Value Ref Range  Glucose-Capillary 52 (L) 65 - 99 mg/dL Glucose, random     Status: Abnormal  Collection Time: 08/26/16  7:25 AM Result Value Ref Range  Glucose, Bld 136 (H) 65 - 99 mg/dL Glucose, capillary     Status: Abnormal  Collection Time: 08/26/16  9:04 AM Result Value Ref Range  Glucose-Capillary 191 (H) 65 - 99 mg/dL Renal function     Status: Abnormal  Collection Time: 08/26/16  9:16 AM Result Value Ref Range  Sodium 139 135 - 145 mmol/L  Potassium 3.6 3.5 - 5.1 mmol/L  Chloride 103 101 - 111 mmol/L  CO2 26 22 - 32 mmol/L  Glucose, Bld 182 (H) 65 - 99 mg/dL  BUN 33 (H) 6 - 20 mg/dL  Creatinine, Ser 2.36 (H) 0.44 - 1.00 mg/dL  Calcium 7.1 (L) 8.9 - 10.3 mg/dL  Phosphorus 3.3 2.5 - 4.6 mg/dL  Albumin 2.2 (L) 3.5 - 5.0 g/dL  GFR calc non Af Amer 20 (L) >60 mL/min  GFR calc Af Amer 23 (L) >60 mL/min   Comment: (NOTE) The eGFR has been calculated using the CKD EPI equation. This calculation has not been validated in all clinical situations. eGFR's persistently <60 mL/min signify possible Chronic Kidney Disease.   Anion gap 10 5 - 15 Magnesium     Status: None  Collection Time: 08/26/16  9:16 AM Result Value Ref Range  Magnesium 1.8 1.7 - 2.4 mg/dL Procalcitonin     Status: None  Collection Time: 08/26/16  9:16 AM Result Value Ref Range  Procalcitonin >150.00 ng/mL   Comment:        Interpretation: PCT >= 10 ng/mL: Important systemic inflammatory response, almost exclusively due to severe bacterial sepsis or septic shock. (NOTE)         ICU PCT Algorithm               Non ICU PCT Algorithm    ----------------------------     ------------------------------         PCT < 0.25 ng/mL                  PCT < 0.1 ng/mL     Stopping of antibiotics            Stopping of antibiotics       strongly encouraged.               strongly encouraged.    ----------------------------     ------------------------------       PCT level decrease by               PCT < 0.25 ng/mL       >= 80% from peak PCT       OR PCT 0.25 - 0.5 ng/mL          Stopping of antibiotics                                             encouraged.     Stopping of antibiotics           encouraged.    ----------------------------     ------------------------------       PCT level decrease by              PCT >= 0.25 ng/mL       < 80% from peak PCT        AND PCT >= 0.5 ng/mL             Continuing antibiotics                                              encouraged.       Continuing antibiotics            encouraged.    ----------------------------     ------------------------------  PCT level increase compared          PCT > 0.5 ng/mL         with peak PCT AND          PCT >= 0.5 ng/mL             Escalation of antibiotics                                          strongly encouraged.      Escalation of antibiotics        strongly encouraged.  Glucose, capillary     Status: Abnormal  Collection Time: 08/26/16 10:19 AM Result Value Ref Range  Glucose-Capillary 186 (H) 65 - 99 mg/dL Glucose, capillary     Status: Abnormal  Collection Time: 08/26/16 11:07 AM Result Value Ref Range  Glucose-Capillary 163 (H) 65 - 99 mg/dL Glucose, capillary     Status: Abnormal  Collection Time: 08/26/16 12:23 PM Result Value Ref Range  Glucose-Capillary 171 (H) 65 - 99 mg/dL Renal function     Status: Abnormal  Collection Time: 08/26/16 12:32 PM Result Value Ref Range  Sodium 139 135 - 145 mmol/L  Potassium 4.0 3.5 - 5.1 mmol/L  Chloride 101 101 - 111 mmol/L  CO2 26 22 - 32 mmol/L  Glucose, Bld 118 (H) 65 - 99 mg/dL  BUN 33 (H) 6 - 20 mg/dL  Creatinine, Ser 2.13 (H) 0.44 - 1.00 mg/dL  Calcium 7.4 (L) 8.9 - 10.3  mg/dL  Phosphorus 3.4 2.5 - 4.6 mg/dL  Albumin 2.4 (L) 3.5 - 5.0 g/dL  GFR calc non Af Amer 23 (L) >60 mL/min  GFR calc Af Amer 26 (L) >60 mL/min   Comment: (NOTE) The eGFR has been calculated using the CKD EPI equation. This calculation has not been validated in all clinical situations. eGFR's persistently <60 mL/min signify possible Chronic Kidney Disease.   Anion gap 12 5 - 15 Magnesium     Status: None  Collection Time: 08/26/16  4:14 PM Result Value Ref Range  Magnesium 1.9 1.7 - 2.4 mg/dL Glucose, capillary     Status: Abnormal  Collection Time: 08/26/16  4:18 PM Result Value Ref Range  Glucose-Capillary 229 (H) 65 - 99 mg/dL Glucose, capillary     Status: Abnormal  Collection Time: 08/26/16  7:48 PM Result Value Ref Range  Glucose-Capillary 206 (H) 65 - 99 mg/dL C difficile quick scan w PCR reflex     Status: None  Collection Time: 08/26/16  8:05 PM Result Value Ref Range  C Diff antigen NEGATIVE NEGATIVE  C Diff toxin NEGATIVE NEGATIVE  C Diff interpretation No C. difficile detected.  Renal function     Status: Abnormal  Collection Time: 08/26/16  9:34 PM Result Value Ref Range  Sodium 140 135 - 145 mmol/L  Potassium 3.8 3.5 - 5.1 mmol/L  Chloride 103 101 - 111 mmol/L  CO2 27 22 - 32 mmol/L  Glucose, Bld 234 (H) 65 - 99 mg/dL  BUN 37 (H) 6 - 20 mg/dL  Creatinine, Ser 2.12 (H) 0.44 - 1.00 mg/dL  Calcium 7.9 (L) 8.9 - 10.3 mg/dL  Phosphorus 3.3 2.5 - 4.6 mg/dL  Albumin 2.3 (L) 3.5 - 5.0 g/dL  GFR calc non Af Amer 23 (L) >60 mL/min  GFR calc Af Amer 26 (L) >60 mL/min   Comment: (NOTE) The eGFR has been calculated  using the CKD EPI equation. This calculation has not been validated in all clinical situations. eGFR's persistently <60 mL/min signify possible Chronic Kidney Disease.   Anion gap 10 5 - 15 Magnesium     Status: None  Collection Time: 08/26/16  9:34 PM Result Value Ref Range  Magnesium 1.8 1.7 - 2.4 mg/dL Glucose, capillary     Status:  Abnormal  Collection Time: 08/27/16 12:34 AM Result Value Ref Range  Glucose-Capillary 246 (H) 65 - 99 mg/dL Renal function     Status: Abnormal  Collection Time: 08/27/16  1:15 AM Result Value Ref Range  Sodium 141 135 - 145 mmol/L  Potassium 3.6 3.5 - 5.1 mmol/L  Chloride 107 101 - 111 mmol/L  CO2 26 22 - 32 mmol/L  Glucose, Bld 230 (H) 65 - 99 mg/dL  BUN 37 (H) 6 - 20 mg/dL  Creatinine, Ser 2.07 (H) 0.44 - 1.00 mg/dL  Calcium 7.2 (L) 8.9 - 10.3 mg/dL  Phosphorus 3.3 2.5 - 4.6 mg/dL  Albumin 2.1 (L) 3.5 - 5.0 g/dL  GFR calc non Af Amer 23 (L) >60 mL/min  GFR calc Af Amer 27 (L) >60 mL/min   Comment: (NOTE) The eGFR has been calculated using the CKD EPI equation. This calculation has not been validated in all clinical situations. eGFR's persistently <60 mL/min signify possible Chronic Kidney Disease.   Anion gap 8 5 - 15 Magnesium     Status: None  Collection Time: 08/27/16  1:15 AM Result Value Ref Range  Magnesium 1.8 1.7 - 2.4 mg/dL Glucose, capillary     Status: Abnormal  Collection Time: 08/27/16  4:16 AM Result Value Ref Range  Glucose-Capillary 255 (H) 65 - 99 mg/dL Phosphorus     Status: None  Collection Time: 08/27/16  5:05 AM Result Value Ref Range  Phosphorus 3.2 2.5 - 4.6 mg/dL Comprehensive metabolic panel     Status: Abnormal  Collection Time: 08/27/16  5:05 AM Result Value Ref Range  Sodium 138 135 - 145 mmol/L  Potassium 4.0 3.5 - 5.1 mmol/L  Chloride 103 101 - 111 mmol/L  CO2 27 22 - 32 mmol/L  Glucose, Bld 276 (H) 65 - 99 mg/dL  BUN 40 (H) 6 - 20 mg/dL  Creatinine, Ser 1.97 (H) 0.44 - 1.00 mg/dL  Calcium 7.6 (L) 8.9 - 10.3 mg/dL  Total Protein 5.5 (L) 6.5 - 8.1 g/dL  Albumin 2.2 (L) 3.5 - 5.0 g/dL  AST 69 (H) 15 - 41 U/L  ALT 55 (H) 14 - 54 U/L  Alkaline Phosphatase 98 38 - 126 U/L  Total Bilirubin 0.7 0.3 - 1.2 mg/dL  GFR calc non Af Amer 25 (L) >60 mL/min  GFR calc Af Amer 29 (L) >60 mL/min   Comment: (NOTE) The eGFR has been calculated  using the CKD EPI equation. This calculation has not been validated in all clinical situations. eGFR's persistently <60 mL/min signify possible Chronic Kidney Disease.   Anion gap 8 5 - 15 Magnesium     Status: None  Collection Time: 08/27/16  5:05 AM Result Value Ref Range  Magnesium 1.9 1.7 - 2.4 mg/dL CBC with Differential/Platelet     Status: Abnormal  Collection Time: 08/27/16  6:20 AM Result Value Ref Range  WBC 31.3 (H) 3.6 - 11.0 K/uL  RBC 3.45 (L) 3.80 - 5.20 MIL/uL  Hemoglobin 9.9 (L) 12.0 - 16.0 g/dL  HCT 30.1 (L) 35.0 - 47.0 %  MCV 87.2 80.0 - 100.0 fL  MCH 28.6 26.0 - 34.0 pg  MCHC 32.8 32.0 - 36.0 g/dL  RDW 16.9 (H) 11.5 - 14.5 %  Platelets 18 (LL) 150 - 440 K/uL   Comment: CRITICAL VALUE NOTED.  VALUE IS CONSISTENT WITH PREVIOUSLY REPORTED AND CALLED VALUE.  Neutrophils Relative % 90 %  Lymphocytes Relative 7 %  Monocytes Relative 3 %  Eosinophils Relative 0 %  Basophils Relative 0 %  Neutro Abs 28.2 (H) 1.4 - 6.5 K/uL  Lymphs Abs 2.2 1.0 - 3.6 K/uL  Monocytes Absolute 0.9 0.2 - 0.9 K/uL  Eosinophils Absolute 0.0 0 - 0.7 K/uL  Basophils Absolute 0.0 0 - 0.1 K/uL APTT     Status: None  Collection Time: 08/27/16  6:20 AM Result Value Ref Range  aPTT 33 24 - 36 seconds Glucose, capillary     Status: Abnormal  Collection Time: 08/27/16  7:49 AM Result Value Ref Range  Glucose-Capillary <10 (LL) 65 - 99 mg/dL Glucose, capillary     Status: Abnormal  Collection Time: 08/27/16  8:21 AM Result Value Ref Range  Glucose-Capillary 20 (LL) 65 - 99 mg/dL Glucose, capillary     Status: Abnormal  Collection Time: 08/27/16  8:41 AM Result Value Ref Range  Glucose-Capillary <10 (LL) 65 - 99 mg/dL   Comment: QUESTIONABLE RESULTS - CHARGE CREDITED REPEATED TO VERIFY Performed at [facility], [address], [place], [place] [address]  Glucose, capillary     Status: Abnormal  Collection Time: 08/27/16  8:43 AM Result Value Ref Range  Glucose-Capillary 303  (H) 65 - 99 mg/dL Magnesium     Status: None  Collection Time: 08/27/16 10:09 AM Result Value Ref Range  Magnesium 1.9 1.7 - 2.4 mg/dL Renal function     Status: Abnormal  Collection Time: 08/27/16 10:09 AM Result Value Ref Range  Sodium 138 135 - 145 mmol/L  Potassium 3.9 3.5 - 5.1 mmol/L  Chloride 103 101 - 111 mmol/L  CO2 26 22 - 32 mmol/L  Glucose, Bld 276 (H) 65 - 99 mg/dL  BUN 39 (H) 6 - 20 mg/dL  Creatinine, Ser 2.06 (H) 0.44 - 1.00 mg/dL  Calcium 8.0 (L) 8.9 - 10.3 mg/dL  Phosphorus 2.8 2.5 - 4.6 mg/dL  Albumin 2.2 (L) 3.5 - 5.0 g/dL  GFR calc non Af Amer 24 (L) >60 mL/min  GFR calc Af Amer 27 (L) >60 mL/min   Comment: (NOTE) The eGFR has been calculated using the CKD EPI equation. This calculation has not been validated in all clinical situations. eGFR's persistently <60 mL/min signify possible Chronic Kidney Disease.   Anion gap 9 5 - 15 Glucose, capillary     Status: Abnormal  Collection Time: 08/27/16 11:27 AM Result Value Ref Range  Glucose-Capillary 258 (H) 65 - 99 mg/dL Glucose, capillary     Status: Abnormal  Collection Time: 08/27/16  3:43 PM Result Value Ref Range  Glucose-Capillary 231 (H) 65 - 99 mg/dL Renal function     Status: Abnormal  Collection Time: 08/27/16  4:23 PM Result Value Ref Range  Sodium 137 135 - 145 mmol/L  Potassium 4.0 3.5 - 5.1 mmol/L  Chloride 104 101 - 111 mmol/L  CO2 26 22 - 32 mmol/L  Glucose, Bld 228 (H) 65 - 99 mg/dL  BUN 39 (H) 6 - 20 mg/dL  Creatinine, Ser 1.82 (H) 0.44 - 1.00 mg/dL  Calcium 7.9 (L) 8.9 - 10.3 mg/dL  Phosphorus 2.7 2.5 - 4.6 mg/dL  Albumin 2.2 (L) 3.5 - 5.0 g/dL  GFR calc non Af Amer 27 (L) >  60 mL/min  GFR calc Af Amer 32 (L) >60 mL/min   Comment: (NOTE) The eGFR has been calculated using the CKD EPI equation. This calculation has not been validated in all clinical situations. eGFR's persistently <60 mL/min signify possible Chronic Kidney Disease.   Anion gap 7 5 - 15 Magnesium     Status:  None  Collection Time: 08/27/16  4:23 PM Result Value Ref Range  Magnesium 1.9 1.7 - 2.4 mg/dL Glucose, capillary     Status: Abnormal  Collection Time: 08/27/16  8:08 PM Result Value Ref Range  Glucose-Capillary 153 (H) 65 - 99 mg/dL Renal function     Status: Abnormal  Collection Time: 08/27/16  9:52 PM Result Value Ref Range  Sodium 139 135 - 145 mmol/L  Potassium 4.0 3.5 - 5.1 mmol/L  Chloride 104 101 - 111 mmol/L  CO2 27 22 - 32 mmol/L  Glucose, Bld 221 (H) 65 - 99 mg/dL  BUN 43 (H) 6 - 20 mg/dL  Creatinine, Ser 1.96 (H) 0.44 - 1.00 mg/dL  Calcium 8.0 (L) 8.9 - 10.3 mg/dL  Phosphorus 2.6 2.5 - 4.6 mg/dL  Albumin 2.3 (L) 3.5 - 5.0 g/dL  GFR calc non Af Amer 25 (L) >60 mL/min  GFR calc Af Amer 29 (L) >60 mL/min   Comment: (NOTE) The eGFR has been calculated using the CKD EPI equation. This calculation has not been validated in all clinical situations. eGFR's persistently <60 mL/min signify possible Chronic Kidney Disease.   Anion gap 8 5 - 15 Magnesium     Status: None  Collection Time: 08/27/16  9:52 PM Result Value Ref Range  Magnesium 1.8 1.7 - 2.4 mg/dL Glucose, capillary     Status: Abnormal  Collection Time: 08/27/16 11:24 PM Result Value Ref Range  Glucose-Capillary 212 (H) 65 - 99 mg/dL Glucose, capillary     Status: Abnormal  Collection Time: 08/28/16  3:39 AM Result Value Ref Range  Glucose-Capillary 126 (H) 65 - 99 mg/dL Phosphorus     Status: None  Collection Time: 08/28/16  4:20 AM Result Value Ref Range  Phosphorus 2.5 2.5 - 4.6 mg/dL CBC with Differential/Platelet     Status: Abnormal  Collection Time: 08/28/16  4:20 AM Result Value Ref Range  WBC 34.0 (H) 3.6 - 11.0 K/uL  RBC 3.36 (L) 3.80 - 5.20 MIL/uL  Hemoglobin 9.8 (L) 12.0 - 16.0 g/dL  HCT 29.8 (L) 35.0 - 47.0 %  MCV 88.6 80.0 - 100.0 fL  MCH 29.0 26.0 - 34.0 pg  MCHC 32.8 32.0 - 36.0 g/dL  RDW 17.2 (H) 11.5 - 14.5 %  Platelets 17 (LL) 150 - 440 K/uL   Comment: CRITICAL VALUE NOTED.   VALUE IS CONSISTENT WITH PREVIOUSLY REPORTED AND CALLED VALUE.  Neutrophils Relative % 90 %  Lymphocytes Relative 8 %  Monocytes Relative 0 %  Eosinophils Relative 1 %  Basophils Relative 1 %  Neutro Abs 30.7 (H) 1.4 - 6.5 K/uL  Lymphs Abs 2.7 1.0 - 3.6 K/uL  Monocytes Absolute 0.0 (L) 0.2 - 0.9 K/uL  Eosinophils Absolute 0.3 0 - 0.7 K/uL  Basophils Absolute 0.3 (H) 0 - 0.1 K/uL  Smear Review MORPHOLOGY UNREMARKABLE  Comprehensive metabolic panel     Status: Abnormal  Collection Time: 08/28/16  4:20 AM Result Value Ref Range  Sodium 140 135 - 145 mmol/L  Potassium 3.8 3.5 - 5.1 mmol/L  Chloride 107 101 - 111 mmol/L  CO2 27 22 - 32 mmol/L  Glucose, Bld 213 (H) 65 -  99 mg/dL  BUN 52 (H) 6 - 20 mg/dL  Creatinine, Ser 2.21 (H) 0.44 - 1.00 mg/dL  Calcium 7.6 (L) 8.9 - 10.3 mg/dL  Total Protein 5.3 (L) 6.5 - 8.1 g/dL  Albumin 2.0 (L) 3.5 - 5.0 g/dL  AST 111 (H) 15 - 41 U/L  ALT 57 (H) 14 - 54 U/L  Alkaline Phosphatase 130 (H) 38 - 126 U/L  Total Bilirubin 0.6 0.3 - 1.2 mg/dL  GFR calc non Af Amer 22 (L) >60 mL/min  GFR calc Af Amer 25 (L) >60 mL/min   Comment: (NOTE) The eGFR has been calculated using the CKD EPI equation. This calculation has not been validated in all clinical situations. eGFR's persistently <60 mL/min signify possible Chronic Kidney Disease.   Anion gap 6 5 - 15 Procalcitonin     Status: None  Collection Time: 08/28/16  4:20 AM Result Value Ref Range  Procalcitonin 138.48 ng/mL   Comment:        Interpretation: PCT >= 10 ng/mL: Important systemic inflammatory response, almost exclusively due to severe bacterial sepsis or septic shock. (NOTE)         ICU PCT Algorithm               Non ICU PCT Algorithm    ----------------------------     ------------------------------         PCT < 0.25 ng/mL                 PCT < 0.1 ng/mL     Stopping of antibiotics            Stopping of antibiotics       strongly encouraged.               strongly encouraged.     ----------------------------     ------------------------------       PCT level decrease by               PCT < 0.25 ng/mL       >= 80% from peak PCT       OR PCT 0.25 - 0.5 ng/mL          Stopping of antibiotics                                             encouraged.     Stopping of antibiotics           encouraged.    ----------------------------     ------------------------------       PCT level decrease by              PCT >= 0.25 ng/mL       < 80% from peak PCT        AND PCT >= 0.5 ng/mL             Continuing antibiotics                                              encouraged.       Continuing antibiotics            encouraged.    ----------------------------     ------------------------------     PCT level increase compared  PCT > 0.5 ng/mL         with peak PCT AND          PCT >= 0.5 ng/mL             Escalation of antibiotics                                          strongly encouraged.      Escalation of antibiotics        strongly encouraged.  Troponin I     Status: Abnormal  Collection Time: 08/28/16  4:20 AM Result Value Ref Range  Troponin I 0.72 (HH) <0.03 ng/mL   Comment: CRITICAL RESULT CALLED TO, READ BACK BY AND VERIFIED WITH RENEE BABB 08/28/16 0530 [facility]  Fibrin derivatives D-Dimer ([facility] only)     Status: Abnormal  Collection Time: 08/28/16  4:41 AM Result Value Ref Range  Fibrin derivatives D-dimer ([facility]) >7,500.00 (H) 0.00 - 499.00   Comment: (NOTE) <> Exclusion of Venous Thromboembolism (VTE) - OUTPATIENT ONLY   (Emergency Department or [place])   0-499 ng/ml (FEU): With a low to intermediate pretest probability                      for VTE this test result excludes the diagnosis                      of VTE.   >499 ng/ml (FEU) : VTE not excluded; additional work up for VTE is                      required. <> Testing on Inpatients and Evaluation of Disseminated Intravascular   Coagulation (DIC) Reference Range:   0-499 ng/ml  (FEU)  Fibrinogen     Status: None  Collection Time: 08/28/16  4:41 AM Result Value Ref Range  Fibrinogen 348 210 - 475 mg/dL Protime-INR     Status: Abnormal  Collection Time: 08/28/16  4:41 AM Result Value Ref Range  Prothrombin Time 15.7 (H) 11.4 - 15.2 seconds  INR 1.24  APTT     Status: None  Collection Time: 08/28/16  4:41 AM Result Value Ref Range  aPTT 33 24 - 36 seconds Glucose, capillary     Status: None  Collection Time: 08/28/16  7:19 AM Result Value Ref Range  Glucose-Capillary 82 65 - 99 mg/dL Glucose, capillary     Status: Abnormal  Collection Time: 08/28/16  7:23 AM Result Value Ref Range  Glucose-Capillary 167 (H) 65 - 99 mg/dL Glucose, capillary     Status: Abnormal  Collection Time: 08/28/16  9:41 AM Result Value Ref Range  Glucose-Capillary 171 (H) 65 - 99 mg/dL Glucose, capillary     Status: Abnormal  Collection Time: 08/28/16  9:44 AM Result Value Ref Range  Glucose-Capillary 185 (H) 65 - 99 mg/dL Magnesium     Status: None  Collection Time: 08/28/16 10:35 AM Result Value Ref Range  Magnesium 1.8 1.7 - 2.4 mg/dL Renal function     Status: Abnormal  Collection Time: 08/28/16 10:35 AM Result Value Ref Range  Sodium 138 135 - 145 mmol/L  Potassium 4.0 3.5 - 5.1 mmol/L  Chloride 105 101 - 111 mmol/L  CO2 24 22 - 32 mmol/L  Glucose, Bld 210 (H) 65 - 99 mg/dL  BUN 50 (H) 6 - 20 mg/dL  Creatinine, Ser 2.10 (H) 0.44 - 1.00 mg/dL  Calcium 7.7 (L) 8.9 - 10.3 mg/dL  Phosphorus 2.6 2.5 - 4.6 mg/dL  Albumin 2.0 (L) 3.5 - 5.0 g/dL  GFR calc non Af Amer 23 (L) >60 mL/min  GFR calc Af Amer 27 (L) >60 mL/min   Comment: (NOTE) The eGFR has been calculated using the CKD EPI equation. This calculation has not been validated in all clinical situations. eGFR's persistently <60 mL/min signify possible Chronic Kidney Disease.   Anion gap 9 5 - 15 Glucose, capillary     Status: Abnormal  Collection Time: 08/28/16 11:39 AM Result Value Ref  Range  Glucose-Capillary 219 (H) 65 - 99 mg/dL Glucose, capillary     Status: Abnormal  Collection Time: 08/28/16  4:01 PM Result Value Ref Range  Glucose-Capillary 218 (H) 65 - 99 mg/dL Renal function     Status: Abnormal  Collection Time: 08/28/16  4:27 PM Result Value Ref Range  Sodium 137 135 - 145 mmol/L  Potassium 4.2 3.5 - 5.1 mmol/L  Chloride 103 101 - 111 mmol/L  CO2 27 22 - 32 mmol/L  Glucose, Bld 229 (H) 65 - 99 mg/dL  BUN 49 (H) 6 - 20 mg/dL  Creatinine, Ser 2.07 (H) 0.44 - 1.00 mg/dL  Calcium 8.0 (L) 8.9 - 10.3 mg/dL  Phosphorus 2.7 2.5 - 4.6 mg/dL  Albumin 2.1 (L) 3.5 - 5.0 g/dL  GFR calc non Af Amer 23 (L) >60 mL/min  GFR calc Af Amer 27 (L) >60 mL/min   Comment: (NOTE) The eGFR has been calculated using the CKD EPI equation. This calculation has not been validated in all clinical situations. eGFR's persistently <60 mL/min signify possible Chronic Kidney Disease.   Anion gap 7 5 - 15 Magnesium     Status: None  Collection Time: 08/28/16  4:27 PM Result Value Ref Range  Magnesium 1.8 1.7 - 2.4 mg/dL Glucose, capillary     Status: Abnormal  Collection Time: 08/28/16  8:16 PM Result Value Ref Range  Glucose-Capillary 190 (H) 65 - 99 mg/dL Renal function     Status: Abnormal  Collection Time: 08/28/16 10:19 PM Result Value Ref Range  Sodium 137 135 - 145 mmol/L  Potassium 4.1 3.5 - 5.1 mmol/L  Chloride 104 101 - 111 mmol/L  CO2 28 22 - 32 mmol/L  Glucose, Bld 187 (H) 65 - 99 mg/dL  BUN 54 (H) 6 - 20 mg/dL  Creatinine, Ser 2.10 (H) 0.44 - 1.00 mg/dL  Calcium 8.1 (L) 8.9 - 10.3 mg/dL  Phosphorus 2.8 2.5 - 4.6 mg/dL  Albumin 2.0 (L) 3.5 - 5.0 g/dL  GFR calc non Af Amer 23 (L) >60 mL/min  GFR calc Af Amer 27 (L) >60 mL/min   Comment: (NOTE) The eGFR has been calculated using the CKD EPI equation. This calculation has not been validated in all clinical situations. eGFR's persistently <60 mL/min signify possible Chronic Kidney Disease.   Anion gap 5 5 -  15 Magnesium     Status: None  Collection Time: 08/28/16 10:19 PM Result Value Ref Range  Magnesium 1.8 1.7 - 2.4 mg/dL Glucose, capillary     Status: Abnormal  Collection Time: 08/28/16 11:43 PM Result Value Ref Range  Glucose-Capillary 186 (H) 65 - 99 mg/dL Magnesium     Status: None  Collection Time: 08/29/16  3:45 AM Result Value Ref Range  Magnesium 1.8 1.7 - 2.4 mg/dL Phosphorus     Status: None  Collection Time: 08/29/16  3:45 AM Result Value  Ref Range  Phosphorus 2.7 2.5 - 4.6 mg/dL CBC with Differential/Platelet     Status: Abnormal  Collection Time: 08/29/16  3:45 AM Result Value Ref Range  WBC 38.8 (H) 3.6 - 11.0 K/uL  RBC 3.10 (L) 3.80 - 5.20 MIL/uL  Hemoglobin 8.8 (L) 12.0 - 16.0 g/dL  HCT 27.2 (L) 35.0 - 47.0 %  MCV 87.5 80.0 - 100.0 fL  MCH 28.2 26.0 - 34.0 pg  MCHC 32.3 32.0 - 36.0 g/dL  RDW 17.3 (H) 11.5 - 14.5 %  Platelets 27 (LL) 150 - 440 K/uL   Comment: PLATELET COUNT CONFIRMED BY SMEAR CRITICAL VALUE NOTED.  VALUE IS CONSISTENT WITH PREVIOUSLY REPORTED AND CALLED VALUE.   Neutrophils Relative % 81 %  Lymphocytes Relative 10 %  Monocytes Relative 9 %  Eosinophils Relative 0 %  Basophils Relative 0 %  nRBC 2 (H) 0 /100 WBC  Neutro Abs 31.4 (H) 1.4 - 6.5 K/uL  Lymphs Abs 3.9 (H) 1.0 - 3.6 K/uL  Monocytes Absolute 3.5 (H) 0.2 - 0.9 K/uL  Eosinophils Absolute 0.0 0 - 0.7 K/uL  Basophils Absolute 0.0 0 - 0.1 K/uL  RBC Morphology MIXED RBC POPULATION    Comment: POLYCHROMASIA PRESENT Procalcitonin     Status: None  Collection Time: 08/29/16  3:45 AM Result Value Ref Range  Procalcitonin 69.77 ng/mL   Comment:        Interpretation: PCT >= 10 ng/mL: Important systemic inflammatory response, almost exclusively due to severe bacterial sepsis or septic shock. (NOTE)         ICU PCT Algorithm               Non ICU PCT Algorithm    ----------------------------     ------------------------------         PCT < 0.25 ng/mL                 PCT < 0.1  ng/mL     Stopping of antibiotics            Stopping of antibiotics       strongly encouraged.               strongly encouraged.    ----------------------------     ------------------------------       PCT level decrease by               PCT < 0.25 ng/mL       >= 80% from peak PCT       OR PCT 0.25 - 0.5 ng/mL          Stopping of antibiotics                                             encouraged.     Stopping of antibiotics           encouraged.    ----------------------------     ------------------------------       PCT level decrease by              PCT >= 0.25 ng/mL       < 80% from peak PCT        AND PCT >= 0.5 ng/mL             Continuing antibiotics  encouraged.       Continuing antibiotics            encouraged.    ----------------------------     ------------------------------     PCT level increase compared          PCT > 0.5 ng/mL         with peak PCT AND          PCT >= 0.5 ng/mL             Escalation of antibiotics                                          strongly encouraged.      Escalation of antibiotics        strongly encouraged.  Renal function     Status: Abnormal  Collection Time: 08/29/16  3:45 AM Result Value Ref Range  Sodium 138 135 - 145 mmol/L  Potassium 4.0 3.5 - 5.1 mmol/L  Chloride 104 101 - 111 mmol/L  CO2 28 22 - 32 mmol/L  Glucose, Bld 184 (H) 65 - 99 mg/dL  BUN 53 (H) 6 - 20 mg/dL  Creatinine, Ser 2.08 (H) 0.44 - 1.00 mg/dL  Calcium 8.1 (L) 8.9 - 10.3 mg/dL  Phosphorus 2.7 2.5 - 4.6 mg/dL  Albumin 2.0 (L) 3.5 - 5.0 g/dL  GFR calc non Af Amer 23 (L) >60 mL/min  GFR calc Af Amer 27 (L) >60 mL/min   Comment: (NOTE) The eGFR has been calculated using the CKD EPI equation. This calculation has not been validated in all clinical situations. eGFR's persistently <60 mL/min signify possible Chronic Kidney Disease.   Anion gap 6 5 - 15 Fibrin derivatives D-Dimer ([facility] only)     Status:  Abnormal  Collection Time: 08/29/16  3:45 AM Result Value Ref Range  Fibrin derivatives D-dimer ([facility]) >7,500.00 (H) 0.00 - 499.00   Comment: (NOTE) <> Exclusion of Venous Thromboembolism (VTE) - OUTPATIENT ONLY   (Emergency Department or [place])   0-499 ng/ml (FEU): With a low to intermediate pretest probability                      for VTE this test result excludes the diagnosis                      of VTE.   >499 ng/ml (FEU) : VTE not excluded; additional work up for VTE is                      required. <> Testing on Inpatients and Evaluation of Disseminated Intravascular   Coagulation (DIC) Reference Range:   0-499 ng/ml (FEU)  Fibrinogen     Status: None  Collection Time: 08/29/16  3:45 AM Result Value Ref Range  Fibrinogen 284 210 - 475 mg/dL Protime-INR     Status: Abnormal  Collection Time: 08/29/16  3:45 AM Result Value Ref Range  Prothrombin Time 15.9 (H) 11.4 - 15.2 seconds  INR 1.26  APTT     Status: None  Collection Time: 08/29/16  3:45 AM Result Value Ref Range  aPTT 33 24 - 36 seconds Glucose, capillary     Status: Abnormal  Collection Time: 08/29/16  3:47 AM Result Value Ref Range  Glucose-Capillary 190 (H) 65 - 99 mg/dL Glucose, capillary     Status: Abnormal  Collection  Time: 08/29/16  7:11 AM Result Value Ref Range  Glucose-Capillary 160 (H) 65 - 99 mg/dL Glucose, capillary     Status: Abnormal  Collection Time: 08/29/16  7:14 AM Result Value Ref Range  Glucose-Capillary 156 (H) 65 - 99 mg/dL Blood gas, arterial     Status: Abnormal  Collection Time: 08/29/16  8:40 AM Result Value Ref Range  FIO2 0.35   Delivery systems VENTILATOR   Mode CONTINUOUS POSITIVE AIRWAY PRESSURE   Peep/cpap 5.0 cm H20  Pressure support 5.0 cm H20  pH, Arterial 7.39 7.350 - 7.450  pCO2 arterial 49 (H) 32.0 - 48.0 mmHg  pO2, Arterial 69 (L) 83.0 - 108.0 mmHg  Bicarbonate 29.7 (H) 20.0 - 28.0 mmol/L  Acid-Base Excess 4.0 (H) 0.0 - 2.0 mmol/L  O2  Saturation 93.4 %  Patient temperature 37.0   Collection site LEFT RADIAL   Sample type ARTERIAL DRAW   Allens test (pass/fail) POSITIVE (A) PASS Magnesium     Status: None  Collection Time: 08/29/16 10:13 AM Result Value Ref Range  Magnesium 1.8 1.7 - 2.4 mg/dL Renal function     Status: Abnormal  Collection Time: 08/29/16 10:13 AM Result Value Ref Range  Sodium 139 135 - 145 mmol/L  Potassium 4.0 3.5 - 5.1 mmol/L  Chloride 105 101 - 111 mmol/L  CO2 28 22 - 32 mmol/L  Glucose, Bld 176 (H) 65 - 99 mg/dL  BUN 51 (H) 6 - 20 mg/dL  Creatinine, Ser 1.85 (H) 0.44 - 1.00 mg/dL  Calcium 8.4 (L) 8.9 - 10.3 mg/dL  Phosphorus 2.8 2.5 - 4.6 mg/dL  Albumin 2.3 (L) 3.5 - 5.0 g/dL  GFR calc non Af Amer 27 (L) >60 mL/min  GFR calc Af Amer 31 (L) >60 mL/min   Comment: (NOTE) The eGFR has been calculated using the CKD EPI equation. This calculation has not been validated in all clinical situations. eGFR's persistently <60 mL/min signify possible Chronic Kidney Disease.   Anion gap 6 5 - 15 Glucose, capillary     Status: Abnormal  Collection Time: 08/29/16 11:42 AM Result Value Ref Range  Glucose-Capillary 155 (H) 65 - 99 mg/dL Glucose, capillary     Status: Abnormal  Collection Time: 08/29/16  3:51 PM Result Value Ref Range  Glucose-Capillary 151 (H) 65 - 99 mg/dL Renal function     Status: Abnormal  Collection Time: 08/29/16  4:07 PM Result Value Ref Range  Sodium 138 135 - 145 mmol/L  Potassium 4.2 3.5 - 5.1 mmol/L  Chloride 105 101 - 111 mmol/L  CO2 27 22 - 32 mmol/L  Glucose, Bld 155 (H) 65 - 99 mg/dL  BUN 46 (H) 6 - 20 mg/dL  Creatinine, Ser 1.89 (H) 0.44 - 1.00 mg/dL  Calcium 8.3 (L) 8.9 - 10.3 mg/dL  Phosphorus 2.9 2.5 - 4.6 mg/dL  Albumin 2.2 (L) 3.5 - 5.0 g/dL  GFR calc non Af Amer 26 (L) >60 mL/min  GFR calc Af Amer 30 (L) >60 mL/min   Comment: (NOTE) The eGFR has been calculated using the CKD EPI equation. This calculation has not been validated in all clinical  situations. eGFR's persistently <60 mL/min signify possible Chronic Kidney Disease.   Anion gap 6 5 - 15 Magnesium     Status: None  Collection Time: 08/29/16  4:07 PM Result Value Ref Range  Magnesium 1.8 1.7 - 2.4 mg/dL Procalcitonin - Baseline     Status: None  Collection Time: 08/29/16  8:00 PM Result Value Ref Range  Procalcitonin 44.49  ng/mL   Comment:        Interpretation: PCT >= 10 ng/mL: Important systemic inflammatory response, almost exclusively due to severe bacterial sepsis or septic shock. (NOTE)         ICU PCT Algorithm               Non ICU PCT Algorithm    ----------------------------     ------------------------------         PCT < 0.25 ng/mL                 PCT < 0.1 ng/mL     Stopping of antibiotics            Stopping of antibiotics       strongly encouraged.               strongly encouraged.    ----------------------------     ------------------------------       PCT level decrease by               PCT < 0.25 ng/mL       >= 80% from peak PCT       OR PCT 0.25 - 0.5 ng/mL          Stopping of antibiotics                                             encouraged.     Stopping of antibiotics           encouraged.    ----------------------------     ------------------------------       PCT level decrease by              PCT >= 0.25 ng/mL       < 80% from peak PCT        AND PCT >= 0.5 ng/mL             Continuing antibiotics                                              encouraged.       Continuing antibiotics            encouraged.    ----------------------------     ------------------------------     PCT level increase compared          PCT > 0.5 ng/mL         with peak PCT AND          PCT >= 0.5 ng/mL             Escalation of antibiotics                                          strongly encouraged.      Escalation of antibiotics        strongly encouraged.  CBC     Status: Abnormal  Collection Time: 08/29/16  8:00 PM Result Value Ref  Range  WBC 47.8 (H) 3.6 - 11.0 K/uL  RBC 3.40 (L) 3.80 - 5.20 MIL/uL  Hemoglobin 9.3 (L) 12.0 - 16.0 g/dL  HCT 29.2 (L) 35.0 -  47.0 %  MCV 86.0 80.0 - 100.0 fL  MCH 27.4 26.0 - 34.0 pg  MCHC 31.9 (L) 32.0 - 36.0 g/dL  RDW 16.9 (H) 11.5 - 14.5 %  Platelets 33 (L) 150 - 440 K/uL   Comment: PLATELET COUNT CONFIRMED BY SMEAR Glucose, capillary     Status: Abnormal  Collection Time: 08/29/16  8:04 PM Result Value Ref Range  Glucose-Capillary 138 (H) 65 - 99 mg/dL  Comment 1 Notify RN   Comment 2 Document in Chart  Glucose, capillary     Status: Abnormal  Collection Time: 08/29/16  9:51 PM Result Value Ref Range  Glucose-Capillary 111 (H) 65 - 99 mg/dL Renal function     Status: Abnormal  Collection Time: 08/29/16 10:46 PM Result Value Ref Range  Sodium 139 135 - 145 mmol/L  Potassium 3.9 3.5 - 5.1 mmol/L  Chloride 107 101 - 111 mmol/L  CO2 27 22 - 32 mmol/L  Glucose, Bld 115 (H) 65 - 99 mg/dL  BUN 41 (H) 6 - 20 mg/dL  Creatinine, Ser 1.69 (H) 0.44 - 1.00 mg/dL  Calcium 8.2 (L) 8.9 - 10.3 mg/dL  Phosphorus 2.6 2.5 - 4.6 mg/dL  Albumin 2.1 (L) 3.5 - 5.0 g/dL  GFR calc non Af Amer 30 (L) >60 mL/min  GFR calc Af Amer 35 (L) >60 mL/min   Comment: (NOTE) The eGFR has been calculated using the CKD EPI equation. This calculation has not been validated in all clinical situations. eGFR's persistently <60 mL/min signify possible Chronic Kidney Disease.   Anion gap 5 5 - 15 Magnesium     Status: None  Collection Time: 08/29/16 10:46 PM Result Value Ref Range  Magnesium 1.7 1.7 - 2.4 mg/dL Glucose, capillary     Status: None  Collection Time: 08/29/16 11:49 PM Result Value Ref Range  Glucose-Capillary 99 65 - 99 mg/dL  Comment 1 Notify RN  Magnesium     Status: None  Collection Time: 08/30/16  3:43 AM Result Value Ref Range  Magnesium 1.7 1.7 - 2.4 mg/dL Renal function     Status: Abnormal  Collection Time: 08/30/16  3:43 AM Result Value Ref Range  Sodium 139 135 - 145  mmol/L  Potassium 4.5 3.5 - 5.1 mmol/L  Chloride 106 101 - 111 mmol/L  CO2 27 22 - 32 mmol/L  Glucose, Bld 139 (H) 65 - 99 mg/dL  BUN 39 (H) 6 - 20 mg/dL  Creatinine, Ser 1.54 (H) 0.44 - 1.00 mg/dL  Calcium 8.5 (L) 8.9 - 10.3 mg/dL  Phosphorus 3.0 2.5 - 4.6 mg/dL  Albumin 2.3 (L) 3.5 - 5.0 g/dL  GFR calc non Af Amer 34 (L) >60 mL/min  GFR calc Af Amer 39 (L) >60 mL/min   Comment: (NOTE) The eGFR has been calculated using the CKD EPI equation. This calculation has not been validated in all clinical situations. eGFR's persistently <60 mL/min signify possible Chronic Kidney Disease.   Anion gap 6 5 - 15 Procalcitonin     Status: None  Collection Time: 08/30/16  3:43 AM Result Value Ref Range  Procalcitonin 37.93 ng/mL   Comment:        Interpretation: PCT >= 10 ng/mL: Important systemic inflammatory response, almost exclusively due to severe bacterial sepsis or septic shock. (NOTE)         ICU PCT Algorithm               Non ICU PCT Algorithm    ----------------------------     ------------------------------  PCT < 0.25 ng/mL                 PCT < 0.1 ng/mL     Stopping of antibiotics            Stopping of antibiotics       strongly encouraged.               strongly encouraged.    ----------------------------     ------------------------------       PCT level decrease by               PCT < 0.25 ng/mL       >= 80% from peak PCT       OR PCT 0.25 - 0.5 ng/mL          Stopping of antibiotics                                             encouraged.     Stopping of antibiotics           encouraged.    ----------------------------     ------------------------------       PCT level decrease by              PCT >= 0.25 ng/mL       < 80% from peak PCT        AND PCT >= 0.5 ng/mL             Continuing antibiotics                                              encouraged.       Continuing antibiotics            encouraged.    ----------------------------      ------------------------------     PCT level increase compared          PCT > 0.5 ng/mL         with peak PCT AND          PCT >= 0.5 ng/mL             Escalation of antibiotics                                          strongly encouraged.      Escalation of antibiotics        strongly encouraged.  Prepare Pheresed Platelets     Status: None  Collection Time: 08/30/16  3:43 AM Result Value Ref Range  Unit Number [id number]   Blood Component Type PLTP LR2 PAS   Unit division 00   Status of Unit ISSUED,FINAL   Transfusion Status OK TO TRANSFUSE  Prepare fresh frozen plasma     Status: None  Collection Time: 08/30/16  3:43 AM Result Value Ref Range  Unit Number [id number]   Blood Component Type THAWED PLASMA   Unit division 00   Status of Unit ISSUED,FINAL   Transfusion Status OK TO TRANSFUSE  BPAM FFP     Status: None  Collection Time: 08/30/16  3:43 AM Result Value Ref Range  ISSUE DATE /  TIME [id number]   Blood Product Unit Number [id number]   PRODUCT CODE [id number]   Unit Type and Rh 5100   Blood Product Expiration Date [id number]  BPAM Platelet Pheresis     Status: None  Collection Time: 08/30/16  3:43 AM Result Value Ref Range  ISSUE DATE / TIME [id number]   Blood Product Unit Number [id number]   PRODUCT CODE [id number]   Unit Type and Rh 5100   Blood Product Expiration Date [id number]  Type and screen [place]     Status: None  Collection Time: 08/30/16  3:45 AM Result Value Ref Range  ABO/RH(D) O POS   Antibody Screen NEG   Sample Expiration 09/02/2016  Glucose, capillary     Status: Abnormal  Collection Time: 08/30/16  4:11 AM Result Value Ref Range  Glucose-Capillary 128 (H) 65 - 99 mg/dL  Comment 1 Notify RN  Glucose, capillary     Status: Abnormal  Collection Time: 08/30/16  7:29 AM Result Value Ref Range  Glucose-Capillary 158 (H) 65 - 99 mg/dL Culture, blood (Routine X 2) w Reflex to ID Panel     Status:  None  Collection Time: 08/30/16  9:03 AM Result Value Ref Range  Specimen Description BLOOD LEFT ARM   Special Requests     BOTTLES DRAWN AEROBIC AND ANAEROBIC Blood Culture adequate volume  Culture NO GROWTH 5 DAYS   Report Status 09/04/2016 FINAL  Culture, blood (Routine X 2) w Reflex to ID Panel     Status: None  Collection Time: 08/30/16 10:18 AM Result Value Ref Range  Specimen Description BLOOD LEFT ARM   Special Requests     BOTTLES DRAWN AEROBIC AND ANAEROBIC Blood Culture adequate volume  Culture NO GROWTH 5 DAYS   Report Status 09/04/2016 FINAL  Glucose, capillary     Status: Abnormal  Collection Time: 08/30/16 11:45 AM Result Value Ref Range  Glucose-Capillary 164 (H) 65 - 99 mg/dL Urine Culture     Status: None  Collection Time: 08/30/16 12:01 PM Result Value Ref Range  Specimen Description URINE, RANDOM   Special Requests NONE   Culture     NO GROWTH Performed at [facility], [place] [address], [place], [place] [address]   Report Status 08/31/2016 FINAL  CBC with Differential/Platelet     Status: Abnormal  Collection Time: 08/30/16 12:01 PM Result Value Ref Range  WBC 51.6 (HH) 3.6 - 11.0 K/uL   Comment: CRITICAL RESULT CALLED TO, READ BACK BY AND VERIFIED WITH: JERMAYA KEENY'@1245'$  ON 08/30/16 BY HKP   RBC 3.08 (L) 3.80 - 5.20 MIL/uL  Hemoglobin 8.6 (L) 12.0 - 16.0 g/dL  HCT 26.6 (L) 35.0 - 47.0 %  MCV 86.3 80.0 - 100.0 fL  MCH 27.9 26.0 - 34.0 pg  MCHC 32.3 32.0 - 36.0 g/dL  RDW 17.4 (H) 11.5 - 14.5 %  Platelets 68 (L) 150 - 440 K/uL  Neutrophils Relative % 87 %  Lymphocytes Relative 6 %  Monocytes Relative 7 %  Eosinophils Relative 0 %  Basophils Relative 0 %  Neutro Abs 44.9 (H) 1.4 - 6.5 K/uL  Lymphs Abs 3.1 1.0 - 3.6 K/uL  Monocytes Absolute 3.6 (H) 0.2 - 0.9 K/uL  Eosinophils Absolute 0.0 0 - 0.7 K/uL  Basophils Absolute 0.0 0 - 0.1 K/uL APTT     Status: Abnormal  Collection Time: 08/30/16 12:01 PM Result Value Ref Range  aPTT 56 (H) 24  - 36 seconds   Comment:  IF BASELINE aPTT IS ELEVATED, SUGGEST PATIENT RISK ASSESSMENT BE USED TO DETERMINE APPROPRIATE ANTICOAGULANT THERAPY.  Protime-INR     Status: Abnormal  Collection Time: 08/30/16 12:01 PM Result Value Ref Range  Prothrombin Time 17.7 (H) 11.4 - 15.2 seconds  INR 1.44  Glucose, capillary     Status: Abnormal  Collection Time: 08/30/16  3:36 PM Result Value Ref Range  Glucose-Capillary 130 (H) 65 - 99 mg/dL  Comment 1 Notify RN  Glucose, capillary     Status: Abnormal  Collection Time: 08/30/16  7:54 PM Result Value Ref Range  Glucose-Capillary 124 (H) 65 - 99 mg/dL Glucose, capillary     Status: Abnormal  Collection Time: 08/31/16 12:07 AM Result Value Ref Range  Glucose-Capillary 117 (H) 65 - 99 mg/dL Glucose, capillary     Status: None  Collection Time: 08/31/16  4:00 AM Result Value Ref Range  Glucose-Capillary 74 65 - 99 mg/dL Procalcitonin     Status: None  Collection Time: 08/31/16  4:21 AM Result Value Ref Range  Procalcitonin 31.79 ng/mL   Comment:        Interpretation: PCT >= 10 ng/mL: Important systemic inflammatory response, almost exclusively due to severe bacterial sepsis or septic shock. (NOTE)         ICU PCT Algorithm               Non ICU PCT Algorithm    ----------------------------     ------------------------------         PCT < 0.25 ng/mL                 PCT < 0.1 ng/mL     Stopping of antibiotics            Stopping of antibiotics       strongly encouraged.               strongly encouraged.    ----------------------------     ------------------------------       PCT level decrease by               PCT < 0.25 ng/mL       >= 80% from peak PCT       OR PCT 0.25 - 0.5 ng/mL          Stopping of antibiotics                                             encouraged.     Stopping of antibiotics           encouraged.    ----------------------------     ------------------------------       PCT level decrease by               PCT >= 0.25 ng/mL       < 80% from peak PCT        AND PCT >= 0.5 ng/mL             Continuing antibiotics                                              encouraged.       Continuing antibiotics  encouraged.    ----------------------------     ------------------------------     PCT level increase compared          PCT > 0.5 ng/mL         with peak PCT AND          PCT >= 0.5 ng/mL             Escalation of antibiotics                                          strongly encouraged.      Escalation of antibiotics        strongly encouraged.  CBC     Status: Abnormal  Collection Time: 08/31/16  4:21 AM Result Value Ref Range  WBC 46.9 (H) 3.6 - 11.0 K/uL  RBC 2.82 (L) 3.80 - 5.20 MIL/uL  Hemoglobin 8.2 (L) 12.0 - 16.0 g/dL  HCT 24.5 (L) 35.0 - 47.0 %  MCV 87.1 80.0 - 100.0 fL  MCH 28.9 26.0 - 34.0 pg  MCHC 33.2 32.0 - 36.0 g/dL  RDW 17.0 (H) 11.5 - 14.5 %  Platelets 79 (L) 150 - 440 K/uL Comprehensive metabolic panel     Status: Abnormal  Collection Time: 08/31/16  4:21 AM Result Value Ref Range  Sodium 139 135 - 145 mmol/L  Potassium 4.3 3.5 - 5.1 mmol/L  Chloride 107 101 - 111 mmol/L  CO2 27 22 - 32 mmol/L  Glucose, Bld 142 (H) 65 - 99 mg/dL  BUN 56 (H) 6 - 20 mg/dL  Creatinine, Ser 2.46 (H) 0.44 - 1.00 mg/dL  Calcium 8.2 (L) 8.9 - 10.3 mg/dL  Total Protein 5.3 (L) 6.5 - 8.1 g/dL  Albumin 2.2 (L) 3.5 - 5.0 g/dL  AST 168 (H) 15 - 41 U/L  ALT 73 (H) 14 - 54 U/L  Alkaline Phosphatase 102 38 - 126 U/L  Total Bilirubin 1.0 0.3 - 1.2 mg/dL  GFR calc non Af Amer 19 (L) >60 mL/min  GFR calc Af Amer 22 (L) >60 mL/min   Comment: (NOTE) The eGFR has been calculated using the CKD EPI equation. This calculation has not been validated in all clinical situations. eGFR's persistently <60 mL/min signify possible Chronic Kidney Disease.   Anion gap 5 5 - 15 Magnesium     Status: None  Collection Time: 08/31/16  4:21 AM Result Value Ref Range  Magnesium 1.9 1.7 - 2.4  mg/dL Phosphorus     Status: None  Collection Time: 08/31/16  4:21 AM Result Value Ref Range  Phosphorus 4.1 2.5 - 4.6 mg/dL Glucose, capillary     Status: Abnormal  Collection Time: 08/31/16  7:07 AM Result Value Ref Range  Glucose-Capillary 136 (H) 65 - 99 mg/dL Protime-INR     Status: Abnormal  Collection Time: 08/31/16  7:53 AM Result Value Ref Range  Prothrombin Time 16.5 (H) 11.4 - 15.2 seconds  INR 1.32  APTT     Status: None  Collection Time: 08/31/16  7:53 AM Result Value Ref Range  aPTT 31 24 - 36 seconds Glucose, capillary     Status: Abnormal  Collection Time: 08/31/16 11:00 AM Result Value Ref Range  Glucose-Capillary 203 (H) 65 - 99 mg/dL Glucose, capillary     Status: Abnormal  Collection Time: 08/31/16  3:55 PM Result Value Ref Range  Glucose-Capillary 108 (H) 65 - 99 mg/dL Glucose, capillary  Status: Abnormal  Collection Time: 08/31/16  8:05 PM Result Value Ref Range  Glucose-Capillary 107 (H) 65 - 99 mg/dL CBC     Status: Abnormal  Collection Time: 08/31/16  9:44 PM Result Value Ref Range  WBC 47.1 (H) 3.6 - 11.0 K/uL  RBC 3.15 (L) 3.80 - 5.20 MIL/uL  Hemoglobin 8.7 (L) 12.0 - 16.0 g/dL  HCT 26.8 (L) 35.0 - 47.0 %  MCV 85.1 80.0 - 100.0 fL  MCH 27.7 26.0 - 34.0 pg  MCHC 32.6 32.0 - 36.0 g/dL  RDW 16.6 (H) 11.5 - 14.5 %  Platelets 93 (L) 150 - 440 K/uL Basic metabolic panel     Status: Abnormal  Collection Time: 08/31/16  9:44 PM Result Value Ref Range  Sodium 140 135 - 145 mmol/L  Potassium 3.5 3.5 - 5.1 mmol/L  Chloride 106 101 - 111 mmol/L  CO2 24 22 - 32 mmol/L  Glucose, Bld 126 (H) 65 - 99 mg/dL  BUN 73 (H) 6 - 20 mg/dL  Creatinine, Ser 3.40 (H) 0.44 - 1.00 mg/dL  Calcium 8.6 (L) 8.9 - 10.3 mg/dL  GFR calc non Af Amer 13 (L) >60 mL/min  GFR calc Af Amer 15 (L) >60 mL/min   Comment: (NOTE) The eGFR has been calculated using the CKD EPI equation. This calculation has not been validated in all clinical situations. eGFR's persistently <60  mL/min signify possible Chronic Kidney Disease.   Anion gap 10 5 - 15 Lactic acid, plasma     Status: None  Collection Time: 08/31/16  9:44 PM Result Value Ref Range  Lactic Acid, Venous 1.2 0.5 - 1.9 mmol/L Protime-INR     Status: Abnormal  Collection Time: 08/31/16  9:44 PM Result Value Ref Range  Prothrombin Time 16.3 (H) 11.4 - 15.2 seconds  INR 1.30  APTT     Status: None  Collection Time: 08/31/16  9:44 PM Result Value Ref Range  aPTT 30 24 - 36 seconds Glucose, capillary     Status: Abnormal  Collection Time: 08/31/16 11:52 PM Result Value Ref Range  Glucose-Capillary 146 (H) 65 - 99 mg/dL Glucose, capillary     Status: Abnormal  Collection Time: 09/01/16  3:46 AM Result Value Ref Range  Glucose-Capillary 129 (H) 65 - 99 mg/dL Glucose, capillary     Status: Abnormal  Collection Time: 09/01/16  4:56 AM Result Value Ref Range  Glucose-Capillary 125 (H) 65 - 99 mg/dL Renal function     Status: Abnormal  Collection Time: 09/01/16  6:06 AM Result Value Ref Range  Sodium 138 135 - 145 mmol/L  Potassium 3.8 3.5 - 5.1 mmol/L  Chloride 105 101 - 111 mmol/L  CO2 24 22 - 32 mmol/L  Glucose, Bld 126 (H) 65 - 99 mg/dL  BUN 76 (H) 6 - 20 mg/dL  Creatinine, Ser 3.56 (H) 0.44 - 1.00 mg/dL  Calcium 8.4 (L) 8.9 - 10.3 mg/dL  Phosphorus 5.4 (H) 2.5 - 4.6 mg/dL  Albumin 2.4 (L) 3.5 - 5.0 g/dL  GFR calc non Af Amer 12 (L) >60 mL/min  GFR calc Af Amer 14 (L) >60 mL/min   Comment: (NOTE) The eGFR has been calculated using the CKD EPI equation. This calculation has not been validated in all clinical situations. eGFR's persistently <60 mL/min signify possible Chronic Kidney Disease.   Anion gap 9 5 - 15  *Note: Due to a large number of results and/or encounters for the requested time period, some results have not been displayed. A complete set of results  can be found in Results Review.   Assessment/Plan: 1. 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 She has dry gangrenous  changes with no immediate need for amputation. She has strongly palpable pedal pulses and radial pulses and I do not think this originated from peripheral arterial disease but more so from the vasopressors from her sepsis. I would allow the tissue demarcated she is not having much pain and there is no sign of infection. She will save more tissue that way. She is going to lose at least parts of her second third toes on the left and tips of several other digits although did fingertips and toe tips likely will not require surgical intervention. I suspect she will need at least partial if not complete toe amputations of toes 2 and 3 on the left, but I would allow the tissue to demarcate first. She will return in 1 month. 2. Diabetes. blood glucose control important in reducing the progression of atherosclerotic disease. Also, involved in wound healing. On appropriate medications. 3. Hypertension. blood pressure control important in reducing the progression of atherosclerotic disease. On appropriate oral medications. 4. Renal failure. Acute renal failure has improved but not entirely resolved.      Leotis Pain 10/03/2016, 8:59 AM   This note was created with Dragon medical transcription system.  Any errors from dictation are unintentional.

## 2016-10-09 ENCOUNTER — Ambulatory Visit
Admission: RE | Admit: 2016-10-09 | Discharge: 2016-10-09 | Disposition: A | Discharge status: Home / Self Care | Payer: Medicare PPO | Source: Ambulatory Visit | Attending: Urology | Admitting: Urology

## 2016-10-09 ENCOUNTER — Other Ambulatory Visit: Payer: Self-pay | Admitting: Radiology

## 2016-10-09 ENCOUNTER — Telehealth: Payer: Self-pay | Admitting: Radiology

## 2016-10-09 ENCOUNTER — Encounter: Payer: Self-pay | Admitting: Urology

## 2016-10-09 ENCOUNTER — Ambulatory Visit (INDEPENDENT_AMBULATORY_CARE_PROVIDER_SITE_OTHER): Payer: Medicare PPO | Admitting: Urology

## 2016-10-09 VITALS — BP 102/63 | HR 99 | Ht 65.0 in | Wt 174.0 lb

## 2016-10-09 DIAGNOSIS — N200 Calculus of kidney: Secondary | ICD-10-CM

## 2016-10-09 DIAGNOSIS — N178 Other acute kidney failure: Secondary | ICD-10-CM

## 2016-10-09 DIAGNOSIS — Z8619 Personal history of other infectious and parasitic diseases: Secondary | ICD-10-CM | POA: Diagnosis not present

## 2016-10-09 NOTE — Progress Notes (Signed)
10/09/2016 8:11 AM   Megan Nixon January 26, 1951 [id number]  Referring provider: Danella PentonMiller, Mark F, MD [phone] [facility] [address] [facility] [place], [place] [id number]  Chief Complaint  Patient presents with  . Nephrolithiasis    post w/KUB    HPI:  66 year old female admitted to [facility] on 08/24/2016 with severe sepsis requiring ICU admission, intubation, and CRRT secondary to a 5 mm obstructing left UPJ stone with proximal hydronephrosis and perinephric stranding.  She underwent emergent left nephrostomy tube placement.    She was ultimately discharged on 09/05/2016.  KUB just prior to discharge just showed the obstructing calculus no within the renal pelvis near the level of the nephrostomy tube. She also has a nonobstructing left lower pole stone.  Urine culture grew Klebsiella pneumonia, resistant only to ampicillin and indeterminant sensitivity to nitrofurantoin.  She ultimately completed greater than 2 week course of antibiotics.  She returns today 1 week after shockwave lithotripsy for treatment of her renal pelvic stone. She does state any debris passed through her nephrostomy tube. She's done otherwise well without fevers or chills. KUB today shows no change in the size and location of her stones.  +history of stones. Required ESWL about 3 years ago. Has a history of negative workup for microscopic hematuria (renal sonogram 2012 - no stones seen).     PMH: Past Medical History:  Diagnosis Date  . Acute renal failure (HCC) 2018  . Benign essential hypertension 09/17/2016  . Diabetes mellitus type 2, controlled, with complications (HCC) 11/21/2013   Overview:  microalbuminuria  . Diabetes mellitus without complication (HCC)   . 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 Hypertension   . Medicare annual wellness visit, initial 06/16/2016   Overview:  4/18  . Microscopic hematuria 09/17/2016  . Nephrolithiasis 09/2016   . OSA (obstructive sleep apnea) 11/21/2013  . Stroke [facility](HCC) 04/2014    Surgical History: Past Surgical History:  Procedure Laterality Date  . BREAST EXCISIONAL BIOPSY Bilateral 1971   neg  . COMBINED LAPAROSCOPY W/ HYSTEROSCOPY    . EXTRACORPOREAL SHOCK WAVE LITHOTRIPSY  2013  . EXTRACORPOREAL SHOCK WAVE LITHOTRIPSY Left 10/02/2016   Procedure: EXTRACORPOREAL SHOCK WAVE LITHOTRIPSY (ESWL);  Surgeon: Vanna ScotlandBrandon, Teshawn Moan, MD;  Location: [facility] ORS;  Service: Urology;  Laterality: Left;  . IR NEPHROSTOMY PLACEMENT LEFT  08/24/2016  . VAGINAL HYSTERECTOMY      Home Medications:  Allergies as of 10/09/2016   No Known Allergies     Medication List       Accurate as of 10/09/16 11:59 PM. Always use your most recent med list.          citalopram 20 MG tablet Commonly known as:  CELEXA Take 1 tablet (20 mg total) by mouth daily.   furosemide 40 MG tablet Commonly known as:  LASIX Take 1 tablet (40 mg total) by mouth daily.   glimepiride 1 MG tablet Commonly known as:  AMARYL   pantoprazole 40 MG tablet Commonly known as:  PROTONIX Take 1 tablet (40 mg total) by mouth daily.   tamsulosin 0.4 MG Caps capsule Commonly known as:  FLOMAX Take 1 capsule (0.4 mg total) by mouth daily.   verapamil 180 MG 24 hr capsule Commonly known as:  VERELAN PM Take 180 mg by mouth at bedtime.       Allergies: No Known Allergies  Family History: Family History  Problem Relation Age of Onset  . Breast cancer Neg Hx   .  Bladder Cancer Neg Hx   . Prostate cancer Neg Hx     Social History:  reports that she has never smoked. She has never used smokeless tobacco. She reports that she does not drink alcohol or use drugs.  ROS: UROLOGY Frequent Urination?: No Hard to postpone urination?: No Burning/pain with urination?: No Get up at night to urinate?: No Leakage of urine?: No Urine stream starts and stops?: No Trouble starting stream?: No Do you have to strain to urinate?: No Blood in  urine?: No Urinary tract infection?: No Sexually transmitted disease?: No Injury to kidneys or bladder?: No Painful intercourse?: No Weak stream?: No Currently pregnant?: No Vaginal bleeding?: No Last menstrual period?: n  Gastrointestinal Nausea?: No Vomiting?: No Indigestion/heartburn?: No Diarrhea?: No Constipation?: No  Constitutional Fever: No Night sweats?: No Weight loss?: No Fatigue?: No  Skin Skin rash/lesions?: No Itching?: No  Eyes Blurred vision?: No Double vision?: No  Ears/Nose/Throat Sore throat?: No Sinus problems?: No  Hematologic/Lymphatic Swollen glands?: No Easy bruising?: No  Cardiovascular Leg swelling?: No Chest pain?: No  Respiratory Cough?: No Shortness of breath?: No  Endocrine Excessive thirst?: No  Musculoskeletal Back pain?: No Joint pain?: No  Neurological Headaches?: No Dizziness?: No  Psychologic Depression?: No Anxiety?: No  Physical Exam: BP 102/63   Pulse 99   Ht 5\' 5"  (1.651 m)   Wt 174 lb (78.9 kg)   BMI 28.96 kg/m   Constitutional:  Alert and oriented, No acute distress.  Accompanied by husband today.  HEENT: [place] AT, moist mucus membranes.  Trachea midline, no masses. Cardiovascular: No clubbing, cyanosis, or edema. Respiratory: Normal respiratory effort, no increased work of breathing. GI: Abdomen is soft, nontender, nondistended, no abdominal masses GU: No CVA tenderness. Left nephrostomy tube in place during clear urine. Skin: Necrosis of digits improving dramatically, healing well Neurologic: Grossly intact, no focal deficits, moving all 4 extremities.  Ambulating today. Psychiatric: Normal mood and affect.  Laboratory Data: Lab Results  Component Value Date   WBC 18.1 (H) 09/04/2016   HGB 7.9 (L) 09/04/2016   HCT 23.9 (L) 09/04/2016   MCV 85.3 09/04/2016   PLT 141 (L) 09/04/2016    Lab Results  Component Value Date   CREATININE 3.93 (H) 09/04/2016   Urinalysis N/a  Pertinent  Imaging: CLINICAL DATA:  History of left renal stone with recent lithotripsy  EXAM: ABDOMEN - 1 VIEW  COMPARISON:  10/02/2016  FINDINGS: Scattered large and small bowel gas is noted. Multiple gallstones are seen. Two renal calculi are noted on the left with a nephrostomy catheter in place. The overall appearance is stable from the prior exam. No definitive ureteral stone is seen.  IMPRESSION: Stable appearing left renal calculi.   Electronically Signed   By: Alcide Clever M.D.   On: 10/09/2016 12:01  KUB reviewed today  Assessment & Plan:   1. Kidney stone on left side Status post failed ESWL, essentially no change on KUB Options including PCNL versus ureteroscopy were discussed She would prefer to proceed with less invasive ureteroscopy Risks and benefits of ureteroscopy were reviewed including but not limited to infection, bleeding, pain, ureteral injury which could require open surgery versus prolonged indwelling if ureteralperforation occurs, persistent stone disease, requirement for staged procedure, possible stent, and global anesthesia risks. Patient expressed understanding and desires to proceed with ureteroscopy.  2. History of gram negative sepsis As above, recovering well from life-threatening infection  3. Acute renal failure with other specified pathological lesion in kidney [facility]) Improving  Hollice Espy, MD  [facility] [address] [place], [place] [address] [phone]

## 2016-10-09 NOTE — Telephone Encounter (Signed)
Pt scheduled for URS with Dr Apolinar JunesBrandon on 10/20/16. Made pt aware of surgery date, pre-admit testing appt & to call Friday prior to surgery for arrival time to SDS. Questions answered. Pt voices understanding.

## 2016-10-14 ENCOUNTER — Encounter
Admission: RE | Admit: 2016-10-14 | Discharge: 2016-10-14 | Disposition: A | Discharge status: Home / Self Care | Payer: Medicare PPO | Source: Ambulatory Visit | Attending: Urology | Admitting: Urology

## 2016-10-14 DIAGNOSIS — I96 Gangrene, not elsewhere classified: Secondary | ICD-10-CM | POA: Insufficient documentation

## 2016-10-14 DIAGNOSIS — E118 Type 2 diabetes mellitus with unspecified complications: Secondary | ICD-10-CM | POA: Insufficient documentation

## 2016-10-14 DIAGNOSIS — R3129 Other microscopic hematuria: Secondary | ICD-10-CM | POA: Insufficient documentation

## 2016-10-14 DIAGNOSIS — N200 Calculus of kidney: Secondary | ICD-10-CM | POA: Diagnosis not present

## 2016-10-14 DIAGNOSIS — Z8673 Personal history of transient ischemic attack (TIA), and cerebral infarction without residual deficits: Secondary | ICD-10-CM | POA: Insufficient documentation

## 2016-10-14 DIAGNOSIS — E538 Deficiency of other specified B group vitamins: Secondary | ICD-10-CM | POA: Diagnosis not present

## 2016-10-14 DIAGNOSIS — E782 Mixed hyperlipidemia: Secondary | ICD-10-CM | POA: Insufficient documentation

## 2016-10-14 DIAGNOSIS — R6521 Severe sepsis with septic shock: Secondary | ICD-10-CM | POA: Diagnosis not present

## 2016-10-14 DIAGNOSIS — A419 Sepsis, unspecified organism: Secondary | ICD-10-CM | POA: Diagnosis not present

## 2016-10-14 DIAGNOSIS — Z01812 Encounter for preprocedural laboratory examination: Secondary | ICD-10-CM | POA: Diagnosis present

## 2016-10-14 DIAGNOSIS — N179 Acute kidney failure, unspecified: Secondary | ICD-10-CM | POA: Diagnosis not present

## 2016-10-14 HISTORY — DX: Gastro-esophageal reflux disease without esophagitis: K21.9

## 2016-10-14 HISTORY — DX: Severe sepsis with septic shock: R65.21

## 2016-10-14 HISTORY — DX: Severe sepsis with septic shock: N39.0

## 2016-10-14 HISTORY — DX: Sepsis, unspecified organism: A41.9

## 2016-10-14 HISTORY — DX: Dyspnea, unspecified: R06.00

## 2016-10-14 HISTORY — DX: Peripheral vascular disease, unspecified: I73.9

## 2016-10-14 NOTE — Patient Instructions (Signed)
Your procedure is scheduled on: 10/20/16 Mon  Report to Same Day Surgery 2nd floor medical mall [facility] Entrance-take elevator on left to 2nd floor.  Check in with surgery information desk.) To find out your arrival time please call [phone] between 1PM - 3PM on 10/17/16 Fri 10/17/16 Remember: Instructions that are not followed completely may result in serious medical risk, up to and including death, or upon the discretion of your surgeon and anesthesiologist your surgery may need to be rescheduled.    _x___ 1. Do not eat food or drink liquids after midnight. No gum chewing or                              hard candies.     __x__ 2. No Alcohol for 24 hours before or after surgery.   __x__3. No Smoking for 24 prior to surgery.   ____  4. Bring all medications with you on the day of surgery if instructed.    __x__ 5. Notify your doctor if there is any change in your medical condition     (cold, fever, infections).     Do not wear jewelry, make-up, hairpins, clips or nail polish.  Do not wear lotions, powders, or perfumes. You may wear deodorant.  Do not shave 48 hours prior to surgery. Men may shave face and neck.  Do not bring valuables to the hospital.    [facility] is not responsible for any belongings or valuables.               Contacts, dentures or bridgework may not be worn into surgery.  Leave your suitcase in the car. After surgery it may be brought to your room.  For patients admitted to the hospital, discharge time is determined by your                       treatment team.   Patients discharged the day of surgery will not be allowed to drive home.  You will need someone to drive you home and stay with you the night of your procedure.    Please read over the following fact sheets that you were given:   [facility] Preparing for Surgery and or MRSA Information   _x___ Take anti-hypertensive (unless it includes a diuretic), cardiac, seizure, asthma,     anti-reflux  and psychiatric medicines. These include:  1. pantoprazole (PROTONIX  2.verapamil (VERELAN PM)  3.  4.  5.  6.  ____Fleets enema or Magnesium Citrate as directed.   _x___ Use CHG Soap or sage wipes as directed on instruction sheet   ____ Use inhalers on the day of surgery and bring to hospital day of surgery  ____ Stop Metformin and Janumet 2 days prior to surgery.    ____ Take 1/2 of usual insulin dose the night before surgery and none on the morning     surgery.   _x___ Follow recommendations from Cardiologist, Pulmonologist or PCP regarding          stopping Aspirin, Coumadin, Pllavix ,Eliquis, Effient, or Pradaxa, and Pletal.  X____Stop Anti-inflammatories such as Advil, Aleve, Ibuprofen, Motrin, Naproxen, Naprosyn, Goodies powders or aspirin products. OK to take Tylenol and                          Celebrex.   _x___ Stop supplements until after surgery.  But may continue  Vitamin D, Vitamin B,       and multivitamin.   ____ Bring C-Pap to the hospital.

## 2016-10-19 MED ORDER — DEXTROSE 5 % IV SOLN
1.0000 g | INTRAVENOUS | Status: AC
  Administered 2016-10-20: 1 g via INTRAVENOUS
  Filled 2016-10-19: qty 10

## 2016-10-19 MED ORDER — GENTAMICIN IN SALINE 1.6-0.9 MG/ML-% IV SOLN
80.0000 mg | INTRAVENOUS | Status: AC
  Administered 2016-10-20: 80 mg via INTRAVENOUS
  Filled 2016-10-19: qty 50

## 2016-10-20 ENCOUNTER — Ambulatory Visit
Admission: RE | Admit: 2016-10-20 | Discharge: 2016-10-20 | Disposition: A | Discharge status: Home / Self Care | Payer: Medicare PPO | Source: Ambulatory Visit | Attending: Urology | Admitting: Urology

## 2016-10-20 ENCOUNTER — Encounter: Payer: Self-pay | Admitting: *Deleted

## 2016-10-20 ENCOUNTER — Ambulatory Visit: Payer: Medicare PPO | Admitting: Anesthesiology

## 2016-10-20 ENCOUNTER — Encounter
Admission: RE | Disposition: A | Discharge status: Home / Self Care | Payer: Self-pay | Source: Ambulatory Visit | Attending: Urology

## 2016-10-20 DIAGNOSIS — K219 Gastro-esophageal reflux disease without esophagitis: Secondary | ICD-10-CM | POA: Diagnosis not present

## 2016-10-20 DIAGNOSIS — Z7984 Long term (current) use of oral hypoglycemic drugs: Secondary | ICD-10-CM | POA: Insufficient documentation

## 2016-10-20 DIAGNOSIS — N200 Calculus of kidney: Secondary | ICD-10-CM | POA: Insufficient documentation

## 2016-10-20 DIAGNOSIS — I10 Essential (primary) hypertension: Secondary | ICD-10-CM | POA: Diagnosis not present

## 2016-10-20 DIAGNOSIS — N178 Other acute kidney failure: Secondary | ICD-10-CM | POA: Diagnosis not present

## 2016-10-20 DIAGNOSIS — G4733 Obstructive sleep apnea (adult) (pediatric): Secondary | ICD-10-CM | POA: Diagnosis not present

## 2016-10-20 DIAGNOSIS — E1151 Type 2 diabetes mellitus with diabetic peripheral angiopathy without gangrene: Secondary | ICD-10-CM | POA: Diagnosis not present

## 2016-10-20 DIAGNOSIS — Z8673 Personal history of transient ischemic attack (TIA), and cerebral infarction without residual deficits: Secondary | ICD-10-CM | POA: Diagnosis not present

## 2016-10-20 DIAGNOSIS — Z79899 Other long term (current) drug therapy: Secondary | ICD-10-CM | POA: Diagnosis not present

## 2016-10-20 HISTORY — PX: CYSTOSCOPY/URETEROSCOPY/HOLMIUM LASER/STENT PLACEMENT: SHX6546

## 2016-10-20 LAB — GLUCOSE, CAPILLARY
Glucose-Capillary: 132 mg/dL — ABNORMAL HIGH (ref 65–99)
Glucose-Capillary: 171 mg/dL — ABNORMAL HIGH (ref 65–99)

## 2016-10-20 SURGERY — CYSTOSCOPY/URETEROSCOPY/HOLMIUM LASER/STENT PLACEMENT
Anesthesia: General | Wound class: Clean Contaminated

## 2016-10-20 MED ORDER — FENTANYL CITRATE (PF) 100 MCG/2ML IJ SOLN: 25.0000 ug | INTRAMUSCULAR | Status: DC | PRN

## 2016-10-20 MED ORDER — HYDROCODONE-ACETAMINOPHEN 5-325 MG PO TABS: 1.0000 | ORAL_TABLET | Freq: Four times a day (QID) | ORAL | 0 refills | Status: DC | PRN

## 2016-10-20 MED ORDER — ONDANSETRON HCL 4 MG/2ML IJ SOLN
INTRAMUSCULAR | Status: AC
  Filled 2016-10-20: qty 2

## 2016-10-20 MED ORDER — FENTANYL CITRATE (PF) 100 MCG/2ML IJ SOLN
INTRAMUSCULAR | Status: AC
  Filled 2016-10-20: qty 2

## 2016-10-20 MED ORDER — FENTANYL CITRATE (PF) 100 MCG/2ML IJ SOLN
INTRAMUSCULAR | Status: DC | PRN
  Administered 2016-10-20: 100 ug via INTRAVENOUS

## 2016-10-20 MED ORDER — MIDAZOLAM HCL 2 MG/2ML IJ SOLN
INTRAMUSCULAR | Status: AC
  Filled 2016-10-20: qty 2

## 2016-10-20 MED ORDER — OXYCODONE HCL 5 MG PO TABS: 5.0000 mg | ORAL_TABLET | Freq: Once | ORAL | Status: DC | PRN

## 2016-10-20 MED ORDER — LIDOCAINE HCL (PF) 2 % IJ SOLN
INTRAMUSCULAR | Status: AC
  Filled 2016-10-20: qty 2

## 2016-10-20 MED ORDER — PHENYLEPHRINE HCL 10 MG/ML IJ SOLN
INTRAMUSCULAR | Status: DC | PRN
  Administered 2016-10-20 (×3): 100 ug via INTRAVENOUS

## 2016-10-20 MED ORDER — PROPOFOL 10 MG/ML IV BOLUS
INTRAVENOUS | Status: AC
  Filled 2016-10-20: qty 20

## 2016-10-20 MED ORDER — NEOSTIGMINE METHYLSULFATE 10 MG/10ML IV SOLN
INTRAVENOUS | Status: DC | PRN
  Administered 2016-10-20: 4 mg via INTRAVENOUS

## 2016-10-20 MED ORDER — IOTHALAMATE MEGLUMINE 43 % IV SOLN
INTRAVENOUS | Status: DC | PRN
  Administered 2016-10-20: 15 mL

## 2016-10-20 MED ORDER — OXYBUTYNIN CHLORIDE 5 MG PO TABS: 5.0000 mg | ORAL_TABLET | Freq: Three times a day (TID) | ORAL | 0 refills | Status: DC | PRN

## 2016-10-20 MED ORDER — ONDANSETRON HCL 4 MG/2ML IJ SOLN
INTRAMUSCULAR | Status: DC | PRN
  Administered 2016-10-20: 4 mg via INTRAVENOUS

## 2016-10-20 MED ORDER — GLYCOPYRROLATE 0.2 MG/ML IJ SOLN
INTRAMUSCULAR | Status: DC | PRN
  Administered 2016-10-20: 0.6 mg via INTRAVENOUS

## 2016-10-20 MED ORDER — LIDOCAINE HCL (CARDIAC) 20 MG/ML IV SOLN
INTRAVENOUS | Status: DC | PRN
  Administered 2016-10-20: 20 mg via INTRAVENOUS

## 2016-10-20 MED ORDER — OXYCODONE HCL 5 MG/5ML PO SOLN: 5.0000 mg | Freq: Once | ORAL | Status: DC | PRN

## 2016-10-20 MED ORDER — ROCURONIUM BROMIDE 100 MG/10ML IV SOLN
INTRAVENOUS | Status: DC | PRN
  Administered 2016-10-20: 10 mg via INTRAVENOUS
  Administered 2016-10-20: 30 mg via INTRAVENOUS

## 2016-10-20 MED ORDER — PROPOFOL 10 MG/ML IV BOLUS
INTRAVENOUS | Status: DC | PRN
  Administered 2016-10-20: 120 mg via INTRAVENOUS

## 2016-10-20 MED ORDER — DOCUSATE SODIUM 100 MG PO CAPS: 100.0000 mg | ORAL_CAPSULE | Freq: Two times a day (BID) | ORAL | 0 refills | Status: DC

## 2016-10-20 MED ORDER — SODIUM CHLORIDE 0.9 % IV SOLN
INTRAVENOUS | Status: DC
  Administered 2016-10-20: 07:00:00 via INTRAVENOUS

## 2016-10-20 MED ORDER — DEXAMETHASONE SODIUM PHOSPHATE 10 MG/ML IJ SOLN
INTRAMUSCULAR | Status: DC | PRN
  Administered 2016-10-20: 10 mg via INTRAVENOUS

## 2016-10-20 MED ORDER — MIDAZOLAM HCL 2 MG/2ML IJ SOLN
INTRAMUSCULAR | Status: DC | PRN
  Administered 2016-10-20: 2 mg via INTRAVENOUS

## 2016-10-20 SURGICAL SUPPLY — 29 items
BAG DRAIN CYSTO-URO LG1000N (MISCELLANEOUS) ×4 IMPLANT
BASKET ZERO TIP 1.9FR (BASKET) ×4 IMPLANT
BRUSH SCRUB EZ 1% IODOPHOR (MISCELLANEOUS) ×4 IMPLANT
CATH URETL 5X70 OPEN END (CATHETERS) ×4 IMPLANT
CNTNR SPEC 2.5X3XGRAD LEK (MISCELLANEOUS) ×2
CONRAY 43 FOR UROLOGY 50M (MISCELLANEOUS) ×4 IMPLANT
CONT SPEC 4OZ STER OR WHT (MISCELLANEOUS) ×2
CONTAINER SPEC 2.5X3XGRAD LEK (MISCELLANEOUS) ×2 IMPLANT
DRAPE UTILITY 15X26 TOWEL STRL (DRAPES) ×4 IMPLANT
FIBER LASER LITHO 273 (Laser) ×4 IMPLANT
GLOVE BIO SURGEON STRL SZ 6.5 (GLOVE) ×3 IMPLANT
GLOVE BIO SURGEONS STRL SZ 6.5 (GLOVE) ×1
GOWN STRL REUS W/ TWL LRG LVL3 (GOWN DISPOSABLE) ×4 IMPLANT
GOWN STRL REUS W/TWL LRG LVL3 (GOWN DISPOSABLE) ×4
GUIDEWIRE GREEN .038 145CM (MISCELLANEOUS) ×4 IMPLANT
INFUSOR MANOMETER BAG 3000ML (MISCELLANEOUS) ×4 IMPLANT
INTRODUCER DILATOR DOUBLE (INTRODUCER) ×4 IMPLANT
KIT RM TURNOVER CYSTO AR (KITS) ×4 IMPLANT
PACK CYSTO AR (MISCELLANEOUS) ×4 IMPLANT
SENSORWIRE 0.038 NOT ANGLED (WIRE) ×4
SET CYSTO W/LG BORE CLAMP LF (SET/KITS/TRAYS/PACK) ×4 IMPLANT
SHEATH URETERAL 12FRX35CM (MISCELLANEOUS) ×4 IMPLANT
SOL .9 NS 3000ML IRR  AL (IV SOLUTION) ×2
SOL .9 NS 3000ML IRR UROMATIC (IV SOLUTION) ×2 IMPLANT
STENT URET 6FRX24 CONTOUR (STENTS) ×4 IMPLANT
STENT URET 6FRX26 CONTOUR (STENTS) IMPLANT
SURGILUBE 2OZ TUBE FLIPTOP (MISCELLANEOUS) ×4 IMPLANT
WATER STERILE IRR 1000ML POUR (IV SOLUTION) ×4 IMPLANT
WIRE SENSOR 0.038 NOT ANGLED (WIRE) ×2 IMPLANT

## 2016-10-20 NOTE — Op Note (Signed)
Date of procedure: 10/20/16  Preoperative diagnosis:  1. Left nephrolithiasis 2. History of urosepsis   Postoperative diagnosis:  1. Same as above   Procedure: 1. Left ureteroscopy 2.  laser lithotripsy 3. Basket extraction of Stone fragment 4. Left retrograde pyelogram 5. Left ureteral stent placement 6. Left percutaneous nephrostomy tube removal  Surgeon: Vanna Scotland, MD  Anesthesia: General  Complications: None  Intraoperative findings: Two approximately 1 cm stones, one within the renal pelvis and within the lower pole treated.  Nephrostomy tube removed.    EBL: minimal  Specimens: stone fragement  Drains: 6 x 24 Fr JJ ureteral stent on left  Indication: Megan Nixon is a 66 y.o. patient with obstructing 1 cm renal pelvic stone presenting with urosepsis requiring nephrostomy tube placement. She previously underwent unsuccessful shockwave lithotripsy.  After reviewing the management options for treatment, she elected to proceed with the above surgical procedure(s). We have discussed the potential benefits and risks of the procedure, side effects of the proposed treatment, the likelihood of the patient achieving the goals of the procedure, and any potential problems that might occur during the procedure or recuperation. Informed consent has been obtained.  Description of procedure:  The patient was taken to the operating room and general anesthesia was induced.  The patient was placed in the dorsal lithotomy position, prepped and draped in the usual sterile fashion, and preoperative antibiotics were administered. A preoperative time-out was performed.   A 21 French scope was advanced per urethra into the bladder and attention was turned to the left UO.  A wire was placed up to level of the kidney ynder fluoroscopic guidance.  A dual lumen introducer was then used to introduce the Super Stiff wire up to the same level. The sensor wire was snapped in place as a safety  wire and the superstiff wire was used as a working wire. A Cook 12/14 ureteral access sheath was then advanced up to the proximal ureter without difficulty. The inner cannula and wire were removed. A 8 French dual-lumen flexible ureteroscope was then advanced up to level of the renal pelvis with a nephrostomy tube could be seen and a 1 cm renal pelvic stone. A 273  laser fiber was then used using settings of 0.2 J and 40 Hz to fragment this renal pelvic stone. The scope was then advanced into the lower pole were second 1 cm stone was identified. This was also fragmented into numerous smaller pieces. A 1.9 French to plus nitinol basket was then used to extract all significant stone burden. There is a fairly significant amount of stone debris but the majority was satisfactorily removed. All residual fragment was smaller than the tip of the laser fiber. Retrograde pyelogram was then performed to create a roadmap of the kidney to ensure that the collecting system and been adequately cleared. Once this was deemed satisfactory, the nephrostomy tube was removed under fluoroscopic guidance and a dressing of 4 x 4 and Tegaderm was applied at this location. The scope was then backed down the length of the ureter removing the access sheath along the way. There was no ureteral injury or additional stone fragment in the ureter remaining. A 6 x 24 French double-J ureteral stent was then advanced over the safety wire up to level of the renal pelvis. This created a hook around the lower pole calyx. The wire was then fully withdrawn and a full coil was noted within the bladder. The bladder was then drained. The patient was then  cleaned and dried, repositioned the supine position, reversed from anesthesia, taken to the PACU in stable condition.  Plan: Patient will follow up next week for cystoscopy, stent removal.   Vanna Scotland, M.D.

## 2016-10-20 NOTE — Anesthesia Post-op Follow-up Note (Signed)
Anesthesia QCDR form completed.        

## 2016-10-20 NOTE — H&P (View-Only) (Signed)
10/09/2016 8:11 AM   Megan Nixon Jan 28, 1951 [id number]  Referring provider: Danella Penton, MD [phone] [facility] [address] [facility] [place], [place] [address]  Chief Complaint  Patient presents with  . Nephrolithiasis    post w/KUB    HPI:  66 year old female admitted [facility] on 08/24/2016 with severe sepsis requiring ICU admission, intubation, and CRRT secondary to a 5 mm obstructing left UPJ stone with proximal hydronephrosis and perinephric stranding.  She underwent emergent left nephrostomy tube placement.    She was ultimately discharged on 09/05/2016.  KUB just prior to discharge just showed the obstructing calculus no within the renal pelvis near the level of the nephrostomy tube. She also has a nonobstructing left lower pole stone.  Urine culture grew Klebsiella pneumonia, resistant only to ampicillin and indeterminant sensitivity to nitrofurantoin.  She ultimately completed greater than 2 week course of antibiotics.  She returns today 1 week after shockwave lithotripsy for treatment of her renal pelvic stone. She does state any debris passed through her nephrostomy tube. She's done otherwise well without fevers or chills. KUB today shows no change in the size and location of her stones.  +history of stones. Required ESWL about 3 years ago. Has a history of negative workup for microscopic hematuria (renal sonogram 2012 - no stones seen).     PMH: Past Medical History:  Diagnosis Date  . Acute renal failure (HCC) 2018  . Benign essential hypertension 09/17/2016  . Diabetes mellitus type 2, controlled, with complications (HCC) 11/21/2013   Overview:  microalbuminuria  . Diabetes mellitus without complication (HCC)   . 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 Hypertension   . Medicare annual wellness visit, initial 06/16/2016   Overview:  4/18  . Microscopic hematuria 09/17/2016  . Nephrolithiasis 09/2016   . OSA (obstructive sleep apnea) 11/21/2013  . Stroke [facility]) 04/2014    Surgical History: Past Surgical History:  Procedure Laterality Date  . BREAST EXCISIONAL BIOPSY Bilateral 1971   neg  . COMBINED LAPAROSCOPY W/ HYSTEROSCOPY    . EXTRACORPOREAL SHOCK WAVE LITHOTRIPSY  2013  . EXTRACORPOREAL SHOCK WAVE LITHOTRIPSY Left 10/02/2016   Procedure: EXTRACORPOREAL SHOCK WAVE LITHOTRIPSY (ESWL);  Surgeon: Vanna Scotland, MD;  Location: [facility] ORS;  Service: Urology;  Laterality: Left;  . IR NEPHROSTOMY PLACEMENT LEFT  08/24/2016  . VAGINAL HYSTERECTOMY      Home Medications:  Allergies as of 10/09/2016   No Known Allergies     Medication List       Accurate as of 10/09/16 11:59 PM. Always use your most recent med list.          citalopram 20 MG tablet Commonly known as:  CELEXA Take 1 tablet (20 mg total) by mouth daily.   furosemide 40 MG tablet Commonly known as:  LASIX Take 1 tablet (40 mg total) by mouth daily.   glimepiride 1 MG tablet Commonly known as:  AMARYL   pantoprazole 40 MG tablet Commonly known as:  PROTONIX Take 1 tablet (40 mg total) by mouth daily.   tamsulosin 0.4 MG Caps capsule Commonly known as:  FLOMAX Take 1 capsule (0.4 mg total) by mouth daily.   verapamil 180 MG 24 hr capsule Commonly known as:  VERELAN PM Take 180 mg by mouth at bedtime.       Allergies: No Known Allergies  Family History: Family History  Problem Relation Age of Onset  . Breast cancer Neg Hx   .  Bladder Cancer Neg Hx   . Prostate cancer Neg Hx     Social History:  reports that she has never smoked. She has never used smokeless tobacco. She reports that she does not drink alcohol or use drugs.  ROS: UROLOGY Frequent Urination?: No Hard to postpone urination?: No Burning/pain with urination?: No Get up at night to urinate?: No Leakage of urine?: No Urine stream starts and stops?: No Trouble starting stream?: No Do you have to strain to urinate?: No Blood in  urine?: No Urinary tract infection?: No Sexually transmitted disease?: No Injury to kidneys or bladder?: No Painful intercourse?: No Weak stream?: No Currently pregnant?: No Vaginal bleeding?: No Last menstrual period?: n  Gastrointestinal Nausea?: No Vomiting?: No Indigestion/heartburn?: No Diarrhea?: No Constipation?: No  Constitutional Fever: No Night sweats?: No Weight loss?: No Fatigue?: No  Skin Skin rash/lesions?: No Itching?: No  Eyes Blurred vision?: No Double vision?: No  Ears/Nose/Throat Sore throat?: No Sinus problems?: No  Hematologic/Lymphatic Swollen glands?: No Easy bruising?: No  Cardiovascular Leg swelling?: No Chest pain?: No  Respiratory Cough?: No Shortness of breath?: No  Endocrine Excessive thirst?: No  Musculoskeletal Back pain?: No Joint pain?: No  Neurological Headaches?: No Dizziness?: No  Psychologic Depression?: No Anxiety?: No  Physical Exam: BP 102/63   Pulse 99   Ht 5\' 5"  (1.651 m)   Wt 174 lb (78.9 kg)   BMI 28.96 kg/m   Constitutional:  Alert and oriented, No acute distress.  Accompanied by husband today.  HEENT:  AT, moist mucus membranes.  Trachea midline, no masses. Cardiovascular: No clubbing, cyanosis, or edema. Respiratory: Normal respiratory effort, no increased work of breathing. GI: Abdomen is soft, nontender, nondistended, no abdominal masses GU: No CVA tenderness. Left nephrostomy tube in place during clear urine. Skin: Necrosis of digits improving dramatically, healing well Neurologic: Grossly intact, no focal deficits, moving all 4 extremities.  Ambulating today. Psychiatric: Normal mood and affect.  Laboratory Data: Lab Results  Component Value Date   WBC 18.1 (H) 09/04/2016   HGB 7.9 (L) 09/04/2016   HCT 23.9 (L) 09/04/2016   MCV 85.3 09/04/2016   PLT 141 (L) 09/04/2016    Lab Results  Component Value Date   CREATININE 3.93 (H) 09/04/2016   Urinalysis N/a  Pertinent  Imaging: CLINICAL DATA:  History of left renal stone with recent lithotripsy  EXAM: ABDOMEN - 1 VIEW  COMPARISON:  10/02/2016  FINDINGS: Scattered large and small bowel gas is noted. Multiple gallstones are seen. Two renal calculi are noted on the left with a nephrostomy catheter in place. The overall appearance is stable from the prior exam. No definitive ureteral stone is seen.  IMPRESSION: Stable appearing left renal calculi.   Electronically Signed   By: Alcide Clever M.D.   On: 10/09/2016 12:01  KUB reviewed today  Assessment & Plan:   1. Kidney stone on left side Status post failed ESWL, essentially no change on KUB Options including PCNL versus ureteroscopy were discussed She would prefer to proceed with less invasive ureteroscopy Risks and benefits of ureteroscopy were reviewed including but not limited to infection, bleeding, pain, ureteral injury which could require open surgery versus prolonged indwelling if ureteralperforation occurs, persistent stone disease, requirement for staged procedure, possible stent, and global anesthesia risks. Patient expressed understanding and desires to proceed with ureteroscopy.  2. History of gram negative sepsis As above, recovering well from life-threatening infection  3. Acute renal failure with other specified pathological lesion in kidney [facility]) Improving  Hollice Espy, MD  [facility] [address] [place], [place] [address] [phone]

## 2016-10-20 NOTE — Anesthesia Postprocedure Evaluation (Signed)
Anesthesia Post Note  Patient: JESSAMINE MCLIN  Procedure(s) Performed: Procedure(s) (LRB): CYSTOSCOPY/URETEROSCOPY/HOLMIUM LASER/STENT PLACEMENT (Left) NEPHROSTOMY TUBE REMOVAL (N/A)  Patient location during evaluation: PACU Anesthesia Type: General Level of consciousness: awake and alert Pain management: pain level controlled Vital Signs Assessment: post-procedure vital signs reviewed and stable Respiratory status: spontaneous breathing, nonlabored ventilation, respiratory function stable and patient connected to nasal cannula oxygen Cardiovascular status: blood pressure returned to baseline and stable Postop Assessment: no signs of nausea or vomiting Anesthetic complications: no     Last Vitals:  Vitals:   10/20/16 1015 10/20/16 1039  BP: (!) 125/56 (!) 124/59  Pulse: 86 82  Resp: 20 18  Temp: (!) 36.3 C 36.8 C  SpO2: 97% 96%    Last Pain:  Vitals:   10/20/16 1039  TempSrc: Temporal  PainSc:                  Cleda Mccreedy Jacksyn Beeks

## 2016-10-20 NOTE — Transfer of Care (Signed)
Immediate Anesthesia Transfer of Care Note  Patient: Megan Nixon  Procedure(s) Performed: Procedure(s): CYSTOSCOPY/URETEROSCOPY/HOLMIUM LASER/STENT PLACEMENT (Left) NEPHROSTOMY TUBE REMOVAL (N/A)  Patient Location: PACU  Anesthesia Type:General  Level of Consciousness: awake, alert  and oriented  Airway & Oxygen Therapy: Patient Spontanous Breathing and Patient connected to face mask  Post-op Assessment: Report given to RN  Post vital signs: Reviewed and stable  Last Vitals:  Vitals:   10/20/16 0616  BP: 132/77  Pulse: (!) 102  Resp: 16  Temp: (!) 36.3 C  SpO2: 97%    Last Pain:  Vitals:   10/20/16 0616  TempSrc: Tympanic  PainSc: 0-No pain         Complications: No apparent anesthesia complications

## 2016-10-20 NOTE — Anesthesia Procedure Notes (Signed)
Procedure Name: Intubation Date/Time: 10/20/2016 7:49 AM Performed by: Lorie Apley Pre-anesthesia Checklist: Patient identified, Patient being monitored, Timeout performed, Emergency Drugs available and Suction available Patient Re-evaluated:Patient Re-evaluated prior to induction Oxygen Delivery Method: Circle system utilized Preoxygenation: Pre-oxygenation with 100% oxygen Induction Type: IV induction Ventilation: Mask ventilation without difficulty Laryngoscope Size: Mac and 3 Grade View: Grade II Tube type: Oral Tube size: 7.5 mm Number of attempts: 1 Airway Equipment and Method: Stylet Placement Confirmation: ETT inserted through vocal cords under direct vision,  positive ETCO2 and breath sounds checked- equal and bilateral Secured at: 21 cm Tube secured with: Tape Dental Injury: Teeth and Oropharynx as per pre-operative assessment

## 2016-10-20 NOTE — OR Nursing (Signed)
Dressing can be removed in two days per Dr Apolinar Junes, items to change dressing given to patient with instructions.

## 2016-10-20 NOTE — Discharge Instructions (Signed)
AMBULATORY SURGERY  DISCHARGE INSTRUCTIONS   1) The drugs that you were given will stay in your system until tomorrow so for the next 24 hours you should not:  A) Drive an automobile B) Make any legal decisions C) Drink any alcoholic beverage   2) You may resume regular meals tomorrow.  Today it is better to start with liquids and gradually work up to solid foods.  You may eat anything you prefer, but it is better to start with liquids, then soup and crackers, and gradually work up to solid foods.   3) Please notify your doctor immediately if you have any unusual bleeding, trouble breathing, redness and pain at the surgery site, drainage, fever, or pain not relieved by medication.    4) Additional Instructions:        Please contact your physician with any problems or Same Day Surgery at [phone], Monday through Friday 6 am to 4 pm, or [place] [facility] [place] [facility] number at [phone].You have a ureteral stent in place.  This is a tube that extends from your kidney to your bladder.  This may cause urinary bleeding, burning with urination, and urinary frequency.  Please call our office or present to the ED if you develop fevers >101 or pain which is not able to be controlled with oral pain medications.  You may be given either Flomax and/ or ditropan to help with bladder spasms and stent pain in addition to pain medications.    [facility] [address], [address] , [place] [address] [phone]  

## 2016-10-20 NOTE — Interval H&P Note (Signed)
History and Physical Interval Note:  10/20/2016 7:26 AM  Megan Nixon  has presented today for surgery, with the diagnosis of left kidney stone  The various methods of treatment have been discussed with the patient and family. After consideration of risks, benefits and other options for treatment, the patient has consented to  Procedure(s): CYSTOSCOPY/URETEROSCOPY/HOLMIUM LASER/STENT PLACEMENT (Left) NEPHROSTOMY TUBE REMOVAL (N/A) as a surgical intervention .  The patient's history has been reviewed, patient examined, no change in status, stable for surgery.  I have reviewed the patient's chart and labs.  Questions were answered to the patient's satisfaction.    [facility]  Vanna Scotland

## 2016-10-20 NOTE — Anesthesia Preprocedure Evaluation (Signed)
Anesthesia Evaluation  Patient identified by MRN, date of birth, ID band Patient awake    Reviewed: Allergy & Precautions, H&P , NPO status , Patient's Chart, lab work & pertinent test results  History of Anesthesia Complications Negative for: history of anesthetic complications  Airway Mallampati: III  TM Distance: >3 FB Neck ROM: limited    Dental  (+) Chipped   Pulmonary neg shortness of breath, sleep apnea ,           Cardiovascular Exercise Tolerance: Good hypertension, (-) angina+ Peripheral Vascular Disease  (-) Past MI and (-) DOE      Neuro/Psych CVA, Residual Symptoms negative psych ROS   GI/Hepatic Neg liver ROS, GERD  Controlled and Medicated,  Endo/Other  diabetes, Type 2  Renal/GU Renal disease     Musculoskeletal   Abdominal   Peds  Hematology negative hematology ROS (+)   Anesthesia Other Findings Past Medical History: 2018: Acute renal failure (HCC) 09/17/2016: Benign essential hypertension 11/21/2013: Diabetes mellitus type 2, controlled, with complications  (HCC)     Comment:  Overview:  microalbuminuria No date: Diabetes mellitus without complication (HCC) No date: Dyspnea No date: GERD (gastroesophageal reflux disease) 06/16/2016: History of CVA (cerebrovascular accident)     Comment:  Overview:  Thalamic CVA, 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 No date: Hypertension 06/16/2016: Medicare annual wellness visit, initial     Comment:  Overview:  4/18 09/17/2016: Microscopic hematuria 09/2016: Nephrolithiasis 11/21/2013: OSA (obstructive sleep apnea) No date: PVD (peripheral vascular disease) (HCC)     Comment:  blisters on Lt foot sees Dr Wyn Quaker No date: Septic shock due to urinary tract infection (HCC) 04/2014: Stroke [facility])  Past Surgical History: 1971: BREAST EXCISIONAL BIOPSY; Bilateral     Comment:  neg No date: COMBINED LAPAROSCOPY W/ HYSTEROSCOPY 2013: EXTRACORPOREAL SHOCK WAVE LITHOTRIPSY 10/02/2016: EXTRACORPOREAL  SHOCK WAVE LITHOTRIPSY; Left     Comment:  Procedure: EXTRACORPOREAL SHOCK WAVE LITHOTRIPSY (ESWL);              Surgeon: Vanna Scotland, MD;  Location: [facility] ORS;                Service: Urology;  Laterality: Left; 08/24/2016: IR NEPHROSTOMY PLACEMENT LEFT No date: VAGINAL HYSTERECTOMY  BMI    Body Mass Index:  28.79 kg/m      Reproductive/Obstetrics negative OB ROS                             Anesthesia Physical Anesthesia Plan  ASA: III  Anesthesia Plan: General ETT   Post-op Pain Management:    Induction: Intravenous  PONV Risk Score and Plan: 4 or greater and Ondansetron, Dexamethasone, Midazolam and Treatment may vary due to age or medical condition  Airway Management Planned: Oral ETT  Additional Equipment:   Intra-op Plan:   Post-operative Plan: Extubation in OR  Informed Consent: I have reviewed the patients History and Physical, chart, labs and discussed the procedure including the risks, benefits and alternatives for the proposed anesthesia with the patient or authorized representative who has indicated his/her understanding and acceptance.   Dental Advisory Given  Plan Discussed with: Anesthesiologist, CRNA and Surgeon  Anesthesia Plan Comments: (Patient consented for risks of anesthesia including but not limited to:  - adverse reactions to medications - damage to teeth, lips or other oral mucosa - sore throat or hoarseness - Damage to heart, brain, lungs or loss of life  Patient voiced understanding.)        Anesthesia  Quick Evaluation

## 2016-10-21 ENCOUNTER — Encounter: Payer: Self-pay | Admitting: Urology

## 2016-10-22 NOTE — Anesthesia Postprocedure Evaluation (Signed)
Anesthesia Post Note  Patient: Wandalee FerdinandSarah S Spadafora  Procedure(s) Performed: Procedure(s) (LRB): CYSTOSCOPY/URETEROSCOPY/HOLMIUM LASER/STENT PLACEMENT (Left) NEPHROSTOMY TUBE REMOVAL (N/A)  Patient location during evaluation: PACU Anesthesia Type: General Level of consciousness: awake and alert Pain management: pain level controlled Vital Signs Assessment: post-procedure vital signs reviewed and stable Respiratory status: spontaneous breathing, nonlabored ventilation, respiratory function stable and patient connected to nasal cannula oxygen Cardiovascular status: blood pressure returned to baseline and stable Postop Assessment: no signs of nausea or vomiting Anesthetic complications: no     Last Vitals:  Vitals:   10/20/16 1015 10/20/16 1039  BP: (!) 125/56 (!) 124/59  Pulse: 86 82  Resp: 20 18  Temp: (!) 36.3 C 36.8 C  SpO2: 97% 96%    Last Pain:  Vitals:   10/21/16 1133  TempSrc:   PainSc: 0-No pain                 Cleda MccreedyJoseph K Piscitello

## 2016-10-24 LAB — STONE ANALYSIS
CA OXALATE, MONOHYDR.: 95 %
Ca phos cry stone ql IR: 5 %
Stone Weight KSTONE: 128 mg

## 2016-10-31 ENCOUNTER — Encounter: Payer: Self-pay | Admitting: Urology

## 2016-10-31 ENCOUNTER — Ambulatory Visit (INDEPENDENT_AMBULATORY_CARE_PROVIDER_SITE_OTHER): Payer: Medicare PPO | Admitting: Urology

## 2016-10-31 VITALS — BP 118/71 | HR 99 | Ht 65.0 in | Wt 173.8 lb

## 2016-10-31 DIAGNOSIS — N200 Calculus of kidney: Secondary | ICD-10-CM | POA: Diagnosis not present

## 2016-10-31 DIAGNOSIS — Z8619 Personal history of other infectious and parasitic diseases: Secondary | ICD-10-CM

## 2016-10-31 LAB — MICROSCOPIC EXAMINATION: RBC, UA: >30 /hpf — ABNORMAL HIGH (ref 0–?)

## 2016-10-31 LAB — URINALYSIS, COMPLETE
BILIRUBIN UA: NEGATIVE
GLUCOSE, UA: NEGATIVE
KETONES UA: NEGATIVE
Nitrite, UA: NEGATIVE
SPEC GRAV UA: 1.025 (ref 1.005–1.030)
Urobilinogen, Ur: 0.2 mg/dL (ref 0.2–1.0)
pH, UA: 7 (ref 5.0–7.5)

## 2016-10-31 MED ORDER — CIPROFLOXACIN HCL 500 MG PO TABS
500.0000 mg | ORAL_TABLET | Freq: Once | ORAL | Status: AC
  Administered 2016-10-31: 500 mg via ORAL

## 2016-10-31 MED ORDER — LIDOCAINE HCL 2 % EX GEL
1.0000 "application " | Freq: Once | CUTANEOUS | Status: AC
  Administered 2016-10-31: 1 via URETHRAL

## 2016-10-31 NOTE — Progress Notes (Signed)
   10/31/16  CC:  Chief Complaint  Patient presents with  . Cysto Stent Removal    HPI: Megan Nixon is a 66 y.o. patient with obstructing 1 cm renal pelvic stone presenting with urosepsis requiring nephrostomy tube placement. She previously underwent unsuccessful shockwave lithotripsy.  She returned to the operating room on 10/20/2016 for ureteroscopy to clear her to stones. Nephrostomy was removed at that time. She returns today for cystoscopy, stent removal.  Today, she reports that she is doing very well. She is recovering stamina. No urinary symptoms today. She tolerated the stent well.  Blood pressure 118/71, pulse 99, height 5\' 5"  (1.651 m), weight 173 lb 12.8 oz (78.8 kg). NED. A&Ox3.   No respiratory distress   Abd soft, NT, ND.  Dressing removed from nephrostomy tube site, well-healed. Normal external genitalia with patent urethral meatus  Cystoscopy/ Stent removal procedure  Patient identification was confirmed, informed consent was obtained, and patient was prepped using Betadine solution.  Lidocaine jelly was administered per urethral meatus.    Preoperative abx where received prior to procedure.    Procedure: - Flexible cystoscope introduced, without any difficulty.   - Thorough search of the bladder revealed:    normal urethral meatus  Stent seen emanating from left ureteral orifice, grasped with stent graspers, and removed in entirety.     Post-Procedure: - Patient tolerated the procedure well   Assessment/ Plan:  1. Nephrolithiasis S/p L URS following failed EWSL Stent removed today- warning symptoms reviewed RTC 4 weeks with RUS - Urinalysis, Complete - ciprofloxacin (CIPRO) tablet 500 mg; Take 1 tablet (500 mg total) by mouth once. - lidocaine (XYLOCAINE) 2 % jelly 1 application; Place 1 application into the urethra once. - US RENAL; Future  2. History of gram negative sepsis As above, abx x 1 given today   Return in about 4 weeks (around  11/28/2016) for RUS.    Vanna ScotlandAshley Thu Baggett, MD

## 2016-11-07 ENCOUNTER — Ambulatory Visit (INDEPENDENT_AMBULATORY_CARE_PROVIDER_SITE_OTHER): Payer: Medicare PPO | Admitting: Vascular Surgery

## 2016-11-07 ENCOUNTER — Encounter (INDEPENDENT_AMBULATORY_CARE_PROVIDER_SITE_OTHER): Payer: Self-pay | Admitting: Vascular Surgery

## 2016-11-07 VITALS — BP 132/84 | HR 98 | Resp 16 | Wt 173.6 lb

## 2016-11-07 DIAGNOSIS — I10 Essential (primary) hypertension: Secondary | ICD-10-CM | POA: Diagnosis not present

## 2016-11-07 DIAGNOSIS — E118 Type 2 diabetes mellitus with unspecified complications: Secondary | ICD-10-CM | POA: Diagnosis not present

## 2016-11-07 DIAGNOSIS — I96 Gangrene, not elsewhere classified: Secondary | ICD-10-CM

## 2016-11-07 NOTE — Progress Notes (Signed)
MRN : [id number]  Megan Nixon is a 66 y.o. (December 30, 1950) female who presents with chief complaint of No chief complaint on file. Megan Nixon  History of Present Illness: Patient returns today in follow up of gangrenous toes. She has an ulceration on the right fourth toe base and there is likely a little exposed bone there. She has gangrene on the tip of the left third toe and a scab on the left great toe. The left foot is not painful but the right fourth toe is quite painful. No fevers or chills. No signs of infection. No claudication symptoms.  Current Outpatient Prescriptions Medication Sig Dispense Refill . citalopram (CELEXA) 20 MG tablet Take 1 tablet (20 mg total) by mouth daily. (Patient not taking: Reported on 10/10/2016) 30 tablet 2 . docusate sodium (COLACE) 100 MG capsule Take 1 capsule (100 mg total) by mouth 2 (two) times daily. 60 capsule 0 . furosemide (LASIX) 40 MG tablet Take 1 tablet (40 mg total) by mouth daily. (Patient not taking: Reported on 10/10/2016) 30 tablet 0 . glimepiride (AMARYL) 1 MG tablet Take 1 mg by mouth daily with breakfast.    . HYDROcodone-acetaminophen (NORCO/VICODIN) 5-325 MG tablet Take 1-2 tablets by mouth every 6 (six) hours as needed for moderate pain. 10 tablet 0 . meloxicam (MOBIC) 15 MG tablet Take 15 mg by mouth daily as needed for pain.   Megan Nixon oxybutynin (DITROPAN) 5 MG tablet Take 1 tablet (5 mg total) by mouth every 8 (eight) hours as needed for bladder spasms. 30 tablet 0 . pantoprazole (PROTONIX) 40 MG tablet Take 1 tablet (40 mg total) by mouth daily. 40 tablet 0 . tamsulosin (FLOMAX) 0.4 MG CAPS capsule Take 1 capsule (0.4 mg total) by mouth daily. 30 capsule 0 . verapamil (VERELAN PM) 180 MG 24 hr capsule Take 180 mg by mouth daily.    . vitamin B-12 (CYANOCOBALAMIN) 1000 MCG tablet Take 1,000 mcg by mouth daily.    No current facility-administered medications for this visit.    Past Medical History: Diagnosis Date . Acute renal failure  ([place]) 2018 . Benign essential hypertension 09/17/2016 . Diabetes mellitus type 2, controlled, with complications ([place]) 3/88/8280  Overview:  microalbuminuria . Diabetes mellitus without complication ([place])  . Dyspnea  . GERD (gastroesophageal reflux disease)  . 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 Hypertension  . Medicare annual wellness visit, initial 06/16/2016  Overview:  4/18 . Microscopic hematuria 09/17/2016 . Nephrolithiasis 09/2016 . OSA (obstructive sleep apnea) 11/21/2013 . PVD (peripheral vascular disease) ([facility])   blisters on Lt foot sees Dr Lucky Cowboy . Septic shock due to urinary tract infection ([place])  . Stroke [facility]) 04/2014   Past Surgical History: Procedure Laterality Date . BREAST EXCISIONAL BIOPSY Bilateral 1971  neg . COMBINED LAPAROSCOPY W/ HYSTEROSCOPY   . CYSTOSCOPY/URETEROSCOPY/HOLMIUM LASER/STENT PLACEMENT Left 10/20/2016  Procedure: CYSTOSCOPY/URETEROSCOPY/HOLMIUM LASER/STENT PLACEMENT;  Surgeon: Megan Espy, MD;  Location: [facility] ORS;  Service: Urology;  Laterality: Left; . EXTRACORPOREAL SHOCK WAVE LITHOTRIPSY  2013 . EXTRACORPOREAL SHOCK WAVE LITHOTRIPSY Left 10/02/2016  Procedure: EXTRACORPOREAL SHOCK WAVE LITHOTRIPSY (ESWL);  Surgeon: Megan Espy, MD;  Location: [facility] ORS;  Service: Urology;  Laterality: Left; . IR NEPHROSTOMY PLACEMENT LEFT  08/24/2016 . VAGINAL HYSTERECTOMY    Family History Problem           Relation           Age of Onset .           Breast  cancer  Neg Hx             .           Bladder Cancer           Neg Hx             .           Prostate cancer           Neg Hx             No bleeding disorders, clotting disorders, or aneurysms  Social History Social History Substance Use Topics .           Smoking status:          Never Smoker .           Smokeless tobacco:    Never Used .           Alcohol use     No No IVDU  No Known Allergies      REVIEW OF SYSTEMS (Negative  unless checked)  Constitutional: Weight loss  Fever  Chills Cardiac: Chest pain   Chest pressure   Palpitations   Shortness of breath when laying flat   Shortness of breath at rest   Shortness of breath with exertion. Vascular:  Pain in legs with walking   Pain in legs at rest   Pain in legs when laying flat   Claudication   Pain in feet when walking  Pain in feet at rest  Pain in feet when laying flat   History of DVT   Phlebitis   Swelling in legs   Varicose veins   Non-healing ulcers Pulmonary:   Uses home oxygen   Productive cough   Hemoptysis   Wheeze  COPD   Asthma Neurologic:  Dizziness  Blackouts   Seizures   + for History of stroke   History of TIA  Aphasia   Temporary blindness   Dysphagia   Weakness or numbness in arms   Weakness or numbness in legs Musculoskeletal:  Arthritis   Joint swelling   Joint pain   Low back pain Hematologic:  Easy bruising  Easy bleeding   Hypercoagulable state   Anemic  Hepatitis Gastrointestinal:  Blood in stool   Vomiting blood  Gastroesophageal reflux/heartburn   Abdominal pain Genitourinary: + for Chronic kidney disease   Difficult urination  Frequent urination  Burning with urination  + for Hematuria Skin:  Rashes  + for Ulcers  + for Wounds Psychological:  History of anxiety    History of major depression.   Physical Examination  There were no vitals taken for this visit. Gen:  WD/WN, NAD Head: [place]/AT, No temporalis wasting. Ear/Nose/Throat: Hearing grossly intact, nares w/o erythema or drainage, trachea midline Eyes: Conjunctiva clear. Sclera non-icteric Neck: Supple.  No JVD.  Pulmonary:  Good air movement, no use of accessory muscles.  Cardiac: RRR, normal S1, S2 Vascular:  Vessel Right Left Radial Palpable Palpable Ulnar Palpable Palpable Brachial Palpable Palpable Carotid Palpable, without bruit Palpable, without bruit Aorta Not  palpable N/A Femoral Palpable Palpable Popliteal Palpable Palpable PT Palpable Palpable DP Palpable Palpable  Gastrointestinal: soft, non-tender/non-distended. No guarding/reflex.  Musculoskeletal: M/S 5/5 throughout.  No deformity or atrophy. Neurologic: Sensation grossly intact in extremities.  Symmetrical.  Speech is fluent.  Psychiatric: Judgment intact, Mood & affect appropriate for pt's clinical situation. Dermatologic: Skin changes as described above. Gangrenous tip of the left toe and the right fourth toe  are the most prominent.     Labs Recent Results (from the past 2160 hour(s)) Glucose, capillary     Status: Abnormal  Collection Time: 08/24/16  7:12 AM Result Value Ref Range  Glucose-Capillary 101 (H) 65 - 99 mg/dL Blood gas, arterial     Status: Abnormal  Collection Time: 08/24/16  7:32 AM Result Value Ref Range  FIO2 0.28   Delivery systems NASAL CANNULA   pH, Arterial 7.34 (L) 7.350 - 7.450  pCO2 arterial 26 (L) 32.0 - 48.0 mmHg  pO2, Arterial 67 (L) 83.0 - 108.0 mmHg  Bicarbonate 14.0 (L) 20.0 - 28.0 mmol/L  Acid-base deficit 10.1 (H) 0.0 - 2.0 mmol/L  O2 Saturation 91.7 %  Patient temperature 37.0   Collection site RIGHT RADIAL   Sample type ARTERIAL DRAW   Allens test (pass/fail) PASS PASS CBC     Status: Abnormal  Collection Time: 08/24/16  8:24 AM Result Value Ref Range  WBC 14.8 (H) 3.6 - 11.0 K/uL  RBC 4.44 3.80 - 5.20 MIL/uL  Hemoglobin 13.0 12.0 - 16.0 g/dL  HCT 39.5 35.0 - 47.0 %  MCV 88.9 80.0 - 100.0 fL  MCH 29.2 26.0 - 34.0 pg  MCHC 32.9 32.0 - 36.0 g/dL  RDW 16.3 (H) 11.5 - 14.5 %  Platelets 87 (L) 150 - 440 K/uL Lactic acid, plasma     Status: Abnormal  Collection Time: 08/24/16  8:24 AM Result Value Ref Range  Lactic Acid, Venous 10.6 (HH) 0.5 - 1.9 mmol/L   Comment: CRITICAL RESULT CALLED TO, READ BACK BY AND VERIFIED WITH KIM MAIN ON 08/24/16 AT 0933 BY [facility] RESULT CONFIRMED BY MANUAL DILUTION  Glucose, capillary     Status:  None  Collection Time: 08/24/16  8:24 AM Result Value Ref Range  Glucose-Capillary 89 65 - 99 mg/dL Blood Culture (routine x 2)     Status: Abnormal  Collection Time: 08/24/16  8:25 AM Result Value Ref Range  Specimen Description BLOOD RIGHT ANTECUBITAL   Special Requests     BOTTLES DRAWN AEROBIC AND ANAEROBIC Blood Culture adequate volume  Culture  Setup Time     GRAM NEGATIVE RODS IN BOTH AEROBIC AND ANAEROBIC BOTTLES CRITICAL RESULT CALLED TO, READ BACK BY AND VERIFIED WITH: Olivene Cookston ROBBINS ON 08/24/16 AT 1933 QSD   Culture (A)    KLEBSIELLA PNEUMONIAE SUSCEPTIBILITIES PERFORMED ON PREVIOUS CULTURE WITHIN THE LAST 5 DAYS. Performed at [facility], [place] [address] [place], [place] [address]   Report Status 08/27/2016 FINAL  Blood Culture (routine x 2)     Status: Abnormal  Collection Time: 08/24/16  8:25 AM Result Value Ref Range  Specimen Description BLOOD RIGHT ANTECUBITAL   Special Requests     BOTTLES DRAWN AEROBIC AND ANAEROBIC Blood Culture adequate volume  Culture  Setup Time     GRAM NEGATIVE RODS IN BOTH AEROBIC AND ANAEROBIC BOTTLES CRITICAL RESULT CALLED TO, READ BACK BY AND VERIFIED WITH: Caoimhe Damron ROBBINS ON 08/24/16 AT 1933 QSD   Culture KLEBSIELLA PNEUMONIAE (A)   Report Status 08/27/2016 FINAL   Organism ID, Bacteria KLEBSIELLA PNEUMONIAE      Susceptibility  Klebsiella pneumoniae - MIC*   AMPICILLIN >=32 RESISTANT Resistant    CEFAZOLIN <=4 SENSITIVE Sensitive    CEFEPIME <=1 SENSITIVE Sensitive    CEFTAZIDIME <=1 SENSITIVE Sensitive    CEFTRIAXONE <=1 SENSITIVE Sensitive    CIPROFLOXACIN <=0.25 SENSITIVE Sensitive    GENTAMICIN <=1 SENSITIVE Sensitive    IMIPENEM <=0.25 SENSITIVE Sensitive    TRIMETH/SULFA <=  20 SENSITIVE Sensitive    AMPICILLIN/SULBACTAM 8 SENSITIVE Sensitive    PIP/TAZO <=4 SENSITIVE Sensitive    Extended ESBL NEGATIVE Sensitive    * KLEBSIELLA PNEUMONIAE Blood Culture ID Panel (Reflexed)     Status: Abnormal  Collection Time:  08/24/16  8:25 AM Result Value Ref Range  Enterococcus species NOT DETECTED NOT DETECTED  Listeria monocytogenes NOT DETECTED NOT DETECTED  Staphylococcus species NOT DETECTED NOT DETECTED  Staphylococcus aureus NOT DETECTED NOT DETECTED  Streptococcus species NOT DETECTED NOT DETECTED  Streptococcus agalactiae NOT DETECTED NOT DETECTED  Streptococcus pneumoniae NOT DETECTED NOT DETECTED  Streptococcus pyogenes NOT DETECTED NOT DETECTED  Acinetobacter baumannii NOT DETECTED NOT DETECTED  Enterobacteriaceae species DETECTED (A) NOT DETECTED   Comment: Enterobacteriaceae represent a large family of gram-negative bacteria, not a single organism. CRITICAL RESULT CALLED TO, READ BACK BY AND VERIFIED WITH: Kyna Blahnik ROBBINS ON 08/24/16 AT 1933 QSD   Enterobacter cloacae complex NOT DETECTED NOT DETECTED  Escherichia coli NOT DETECTED NOT DETECTED  Klebsiella oxytoca NOT DETECTED NOT DETECTED  Klebsiella pneumoniae DETECTED (A) NOT DETECTED   Comment: CRITICAL RESULT CALLED TO, READ BACK BY AND VERIFIED WITH: Antanette Richwine ROBBINS ON 08/24/16 AT 1933 QSD   Proteus species NOT DETECTED NOT DETECTED  Serratia marcescens NOT DETECTED NOT DETECTED  Carbapenem resistance NOT DETECTED NOT DETECTED  Haemophilus influenzae NOT DETECTED NOT DETECTED  Neisseria meningitidis NOT DETECTED NOT DETECTED  Pseudomonas aeruginosa NOT DETECTED NOT DETECTED  Candida albicans NOT DETECTED NOT DETECTED  Candida glabrata NOT DETECTED NOT DETECTED  Candida krusei NOT DETECTED NOT DETECTED  Candida parapsilosis NOT DETECTED NOT DETECTED  Candida tropicalis NOT DETECTED NOT DETECTED Urine culture     Status: Abnormal  Collection Time: 08/24/16  9:19 AM Result Value Ref Range  Specimen Description URINE, CATHETERIZED   Special Requests NONE   Culture >=100,000 COLONIES/mL KLEBSIELLA PNEUMONIAE (A)   Report Status 08/26/2016 FINAL   Organism ID, Bacteria KLEBSIELLA PNEUMONIAE (A)      Susceptibility  Klebsiella pneumoniae  - MIC*   AMPICILLIN >=32 RESISTANT Resistant    CEFAZOLIN <=4 SENSITIVE Sensitive    CEFTRIAXONE <=1 SENSITIVE Sensitive    CIPROFLOXACIN <=0.25 SENSITIVE Sensitive    GENTAMICIN <=1 SENSITIVE Sensitive    IMIPENEM <=0.25 SENSITIVE Sensitive    NITROFURANTOIN 64 INTERMEDIATE Intermediate    TRIMETH/SULFA <=20 SENSITIVE Sensitive    AMPICILLIN/SULBACTAM 4 SENSITIVE Sensitive    PIP/TAZO <=4 SENSITIVE Sensitive    Extended ESBL NEGATIVE Sensitive    * >=100,000 COLONIES/mL KLEBSIELLA PNEUMONIAE Comprehensive metabolic panel     Status: Abnormal  Collection Time: 08/24/16  9:19 AM Result Value Ref Range  Sodium 138 135 - 145 mmol/L  Potassium 3.5 3.5 - 5.1 mmol/L  Chloride 101 101 - 111 mmol/L  CO2 17 (L) 22 - 32 mmol/L  Glucose, Bld 76 65 - 99 mg/dL  BUN 27 (H) 6 - 20 mg/dL  Creatinine, Ser 1.91 (H) 0.44 - 1.00 mg/dL  Calcium 8.7 (L) 8.9 - 10.3 mg/dL  Total Protein 6.7 6.5 - 8.1 g/dL  Albumin 3.2 (L) 3.5 - 5.0 g/dL  AST 83 (H) 15 - 41 U/L  ALT 44 14 - 54 U/L   Comment: RESULT CONFIRMED BY MANUAL DILUTION [facility]   Alkaline Phosphatase 78 38 - 126 U/L  Total Bilirubin 0.9 0.3 - 1.2 mg/dL  GFR calc non Af Amer 26 (L) >60 mL/min  GFR calc Af Amer 30 (L) >60 mL/min   Comment: (NOTE) The eGFR has been calculated using the CKD  EPI equation. This calculation has not been validated in all clinical situations. eGFR's persistently <60 mL/min signify possible Chronic Kidney Disease.   Anion gap 20 (H) 5 - 15 Troponin I     Status: Abnormal  Collection Time: 08/24/16  9:19 AM Result Value Ref Range  Troponin I 0.04 (HH) <0.03 ng/mL   Comment: CRITICAL RESULT CALLED TO, READ BACK BY AND VERIFIED WITH DONALD SWEENEY ON 08/24/16 AT 0958 BY [facility]  Urinalysis, Complete w Microscopic     Status: Abnormal  Collection Time: 08/24/16 10:04 AM Result Value Ref Range  Color, Urine AMBER (A) YELLOW   Comment: BIOCHEMICALS MAY BE AFFECTED BY COLOR  APPearance HAZY (A) CLEAR  Specific Gravity,  Urine 1.019 1.005 - 1.030  pH 5.0 5.0 - 8.0  Glucose, UA NEGATIVE NEGATIVE mg/dL  Hgb urine dipstick SMALL (A) NEGATIVE  Bilirubin Urine NEGATIVE NEGATIVE  Ketones, ur 5 (A) NEGATIVE mg/dL  Protein, ur 100 (A) NEGATIVE mg/dL  Nitrite POSITIVE (A) NEGATIVE  Leukocytes, UA NEGATIVE NEGATIVE  RBC / HPF 0-5 0 - 5 RBC/hpf  WBC, UA 6-30 0 - 5 WBC/hpf  Bacteria, UA MANY (A) NONE SEEN  Squamous Epithelial / LPF 0-5 (A) NONE SEEN  Mucus PRESENT   Hyaline Casts, UA PRESENT  Lactic acid, plasma     Status: Abnormal  Collection Time: 08/24/16 12:01 PM Result Value Ref Range  Lactic Acid, Venous 8.5 (HH) 0.5 - 1.9 mmol/L   Comment: CRITICAL RESULT CALLED TO, READ BACK BY AND VERIFIED WITH KIM MAIN ON 08/24/16 AT 1306 BY [facility]  Blood gas, arterial     Status: Abnormal  Collection Time: 08/24/16 12:36 PM Result Value Ref Range  FIO2 0.40   Delivery systems BILEVEL POSITIVE AIRWAY PRESSURE   LHR 12 resp/min  Inspiratory PAP 12   Expiratory PAP 5.0   pH, Arterial 7.18 (LL) 7.350 - 7.450   Comment: CRITICAL RESULT CALLED TO, READ BACK BY AND VERIFIED WITH: DR. CHEN [id number] ABL   pCO2 arterial 29 (L) 32.0 - 48.0 mmHg  pO2, Arterial 94 83.0 - 108.0 mmHg  Bicarbonate 10.8 (L) 20.0 - 28.0 mmol/L  Acid-base deficit 16.2 (H) 0.0 - 2.0 mmol/L  O2 Saturation 95.0 %  Patient temperature 37.0   Collection site REVIEWED BY   Sample type ARTERIAL DRAW   Allens test (pass/fail) PASS PASS APTT     Status: Abnormal  Collection Time: 08/24/16  1:17 PM Result Value Ref Range  aPTT 64 (H) 24 - 36 seconds   Comment:        IF BASELINE aPTT IS ELEVATED, SUGGEST PATIENT RISK ASSESSMENT BE USED TO DETERMINE APPROPRIATE ANTICOAGULANT THERAPY.  Protime-INR     Status: Abnormal  Collection Time: 08/24/16  1:17 PM Result Value Ref Range  Prothrombin Time 23.4 (H) 11.4 - 15.2 seconds  INR 2.05  Blood gas, arterial     Status: Abnormal  Collection Time: 08/24/16  3:29 PM Result Value Ref  Range  FIO2 0.80   Delivery systems VENTILATOR   Mode PRESSURE REGULATED VOLUME CONTROL   VT 500 mL  LHR 20 resp/min  Peep/cpap 5.0 cm H20  pH, Arterial 7.07 (LL) 7.350 - 7.450   Comment: CRITICAL RESULT CALLED TO, READ BACK BY AND VERIFIED WITH: DR. Lyndel Safe [id number] ABL   pCO2 arterial 30 (L) 32.0 - 48.0 mmHg  pO2, Arterial 81 (L) 83.0 - 108.0 mmHg  Bicarbonate 8.7 (L) 20.0 - 28.0 mmol/L  Acid-base deficit 20.2 (H) 0.0 - 2.0 mmol/L  O2 Saturation 89.5 %  Patient temperature 37.0   Sample type ARTERIAL DRAW   Allens test (pass/fail) PASS PASS Prepare fresh frozen plasma     Status: None  Collection Time: 08/24/16  3:30 PM Result Value Ref Range  Unit Number [id number]   Blood Component Type THWPLS APHR2   Unit division 00   Status of Unit ISSUED,FINAL   Transfusion Status OK TO TRANSFUSE  Body fluid culture     Status: None  Collection Time: 08/24/16  3:30 PM Result Value Ref Range  Specimen Description FLUID   Special Requests LEFT NEPHROSTOMY   Gram Stain     WBC PRESENT, PREDOMINANTLY PMN GRAM NEGATIVE RODS CYTOSPIN SMEAR Performed at [facility], [place] [address], [place],  [address]   Culture FEW KLEBSIELLA PNEUMONIAE   Report Status 08/28/2016 FINAL   Organism ID, Bacteria KLEBSIELLA PNEUMONIAE      Susceptibility  Klebsiella pneumoniae - MIC*   AMPICILLIN >=32 RESISTANT Resistant    CEFAZOLIN <=4 SENSITIVE Sensitive    CEFEPIME <=1 SENSITIVE Sensitive    CEFTAZIDIME <=1 SENSITIVE Sensitive    CEFTRIAXONE <=1 SENSITIVE Sensitive    CIPROFLOXACIN <=0.25 SENSITIVE Sensitive    GENTAMICIN <=1 SENSITIVE Sensitive    IMIPENEM <=0.25 SENSITIVE Sensitive    TRIMETH/SULFA <=20 SENSITIVE Sensitive    AMPICILLIN/SULBACTAM 8 SENSITIVE Sensitive    PIP/TAZO <=4 SENSITIVE Sensitive    Extended ESBL NEGATIVE Sensitive    * FEW KLEBSIELLA PNEUMONIAE BPAM FFP     Status: None  Collection Time: 08/24/16  3:30 PM Result Value Ref Range  ISSUE DATE /  TIME [id number]   Blood Product Unit Number [id number]   PRODUCT CODE [id number]   Unit Type and Rh 2800   Blood Product Expiration Date [id number]  Glucose, capillary     Status: Abnormal  Collection Time: 08/24/16  3:42 PM Result Value Ref Range  Glucose-Capillary 55 (L) 65 - 99 mg/dL MRSA PCR Screening     Status: None  Collection Time: 08/24/16  3:44 PM Result Value Ref Range  MRSA by PCR NEGATIVE NEGATIVE   Comment:        The GeneXpert MRSA Assay (FDA approved for NASAL specimens only), is one component of a comprehensive MRSA colonization surveillance program. It is not intended to diagnose MRSA infection nor to guide or monitor treatment for MRSA infections.  Troponin I     Status: Abnormal  Collection Time: 08/24/16  3:44 PM Result Value Ref Range  Troponin I 0.13 (HH) <0.03 ng/mL   Comment: CRITICAL VALUE NOTED. VALUE IS CONSISTENT WITH PREVIOUSLY REPORTED/CALLED VALUE JJB Protime-INR     Status: Abnormal  Collection Time: 08/24/16  3:44 PM Result Value Ref Range  Prothrombin Time 25.0 (H) 11.4 - 15.2 seconds  INR 2.22  Procalcitonin     Status: None  Collection Time: 08/24/16  3:44 PM Result Value Ref Range  Procalcitonin >150.00 ng/mL   Comment:        Interpretation: PCT >= 10 ng/mL: Important systemic inflammatory response, almost exclusively due to severe bacterial sepsis or septic shock. (NOTE)         ICU PCT Algorithm               Non ICU PCT Algorithm    ----------------------------     ------------------------------         PCT < 0.25 ng/mL                 PCT < 0.1 ng/mL  Stopping of antibiotics            Stopping of antibiotics       strongly encouraged.               strongly encouraged.    ----------------------------     ------------------------------       PCT level decrease by               PCT < 0.25 ng/mL       >= 80% from peak PCT       OR PCT 0.25 - 0.5 ng/mL          Stopping of antibiotics                                              encouraged.     Stopping of antibiotics           encouraged.    ----------------------------     ------------------------------       PCT level decrease by              PCT >= 0.25 ng/mL       < 80% from peak PCT        AND PCT >= 0.5 ng/mL             Continuing antibiotics                                              encouraged.       Continuing antibiotics            encouraged.    ----------------------------     ------------------------------     PCT level increase compared          PCT > 0.5 ng/mL         with peak PCT AND          PCT >= 0.5 ng/mL             Escalation of antibiotics                                          strongly encouraged.      Escalation of antibiotics        strongly encouraged.  APTT     Status: Abnormal  Collection Time: 08/24/16  3:44 PM Result Value Ref Range  aPTT 68 (H) 24 - 36 seconds   Comment:        IF BASELINE aPTT IS ELEVATED, SUGGEST PATIENT RISK ASSESSMENT BE USED TO DETERMINE APPROPRIATE ANTICOAGULANT THERAPY.  Glucose, capillary     Status: Abnormal  Collection Time: 08/24/16  3:45 PM Result Value Ref Range  Glucose-Capillary 41 (LL) 65 - 99 mg/dL Vancomycin, random     Status: None  Collection Time: 08/24/16  4:00 PM Result Value Ref Range  Vancomycin Rm 15    Comment:        Random Vancomycin therapeutic range is dependent on dosage and time of specimen collection. A peak range is 20.0-40.0 ug/mL A trough range is 5.0-15.0 ug/mL         CBC with Differential/Platelet  Status: Abnormal  Collection Time: 08/24/16  4:00 PM Result Value Ref Range  WBC 23.4 (H) 3.6 - 11.0 K/uL  RBC 3.77 (L) 3.80 - 5.20 MIL/uL  Hemoglobin 11.1 (L) 12.0 - 16.0 g/dL  HCT 79.0 (L) 38.3 - 33.8 %  MCV 91.9 80.0 - 100.0 fL  MCH 29.3 26.0 - 34.0 pg  MCHC 31.9 (L) 32.0 - 36.0 g/dL  RDW 32.9 (H) 19.1 - 66.0 %  Platelets 69 (L) 150 - 440 K/uL  Neutrophils Relative % 61 %  Lymphocytes Relative 7 %  Monocytes  Relative 2 %  Eosinophils Relative 0 %  Basophils Relative 0 %  Band Neutrophils 13 %  Metamyelocytes Relative 17 %  Myelocytes 0 %  Promyelocytes Absolute 0 %  Blasts 0 %  nRBC 0 0 /100 WBC  Other 0 %  Neutro Abs 21.3 (H) 1.4 - 6.5 K/uL  Lymphs Abs 1.6 1.0 - 3.6 K/uL  Monocytes Absolute 0.5 0.2 - 0.9 K/uL  Eosinophils Absolute 0.0 0 - 0.7 K/uL  Basophils Absolute 0.0 0 - 0.1 K/uL  RBC Morphology MIXED RBC POPULATION   WBC Morphology VACUOLATED NEUTROPHILS  Troponin I (q 6hr x 3)     Status: Abnormal  Collection Time: 08/24/16  4:00 PM Result Value Ref Range  Troponin I 0.13 (HH) <0.03 ng/mL   Comment: CRITICAL VALUE NOTED. VALUE IS CONSISTENT WITH PREVIOUSLY REPORTED/CALLED VALUE  JJB Creatinine, serum     Status: Abnormal  Collection Time: 08/24/16  4:00 PM Result Value Ref Range  Creatinine, Ser 2.57 (H) 0.44 - 1.00 mg/dL  GFR calc non Af Amer 18 (L) >60 mL/min  GFR calc Af Amer 21 (L) >60 mL/min   Comment: (NOTE) The eGFR has been calculated using the CKD EPI equation. This calculation has not been validated in all clinical situations. eGFR's persistently <60 mL/min signify possible Chronic Kidney Disease.  Calcium, ionized     Status: None  Collection Time: 08/24/16  4:14 PM Result Value Ref Range  Calcium, Ionized, Serum LIP1 mg/dL   Comment: (NOTE) Test Not Performed.  Specimen is lipemic.      Notified Alona Bene T. 08/29/2016 Performed At: [facility] [address] [place], [place] [id number] Mila Homer MD [id number]  Cortisol     Status: None  Collection Time: 08/24/16  4:42 PM Result Value Ref Range  Cortisol, Plasma 35.9 ug/dL   Comment: (NOTE) AM    6.7 - 22.6 ug/dL PM   <34.3       ug/dL Performed at [facility], [address], [place], [place] [address]  Type and screen [facility]     Status: None  Collection Time: 08/24/16  4:42 PM Result Value Ref Range  ABO/RH(D) O POS   Antibody  Screen NEG   Sample Expiration 08/27/2016  Glucose, capillary     Status: Abnormal  Collection Time: 08/24/16  6:49 PM Result Value Ref Range  Glucose-Capillary 165 (H) 65 - 99 mg/dL Triglycerides     Status: Abnormal  Collection Time: 08/24/16  9:15 PM Result Value Ref Range  Triglycerides 505 (H) <150 mg/dL Comprehensive metabolic panel     Status: Abnormal  Collection Time: 08/24/16  9:15 PM Result Value Ref Range  Sodium 134 (L) 135 - 145 mmol/L   Comment: ELECTROLYTES REPEATED.PMH  Potassium 3.7 3.5 - 5.1 mmol/L  Chloride 98 (L) 101 - 111 mmol/L  CO2 12 (L) 22 - 32 mmol/L  Glucose, Bld 274 (H) 65 - 99  mg/dL  BUN 35 (H) 6 - 20 mg/dL  Creatinine, Ser 2.62 (H) 0.44 - 1.00 mg/dL  Calcium 7.1 (L) 8.9 - 10.3 mg/dL  Total Protein 5.8 (L) 6.5 - 8.1 g/dL  Albumin 2.7 (L) 3.5 - 5.0 g/dL  AST 94 (H) 15 - 41 U/L  ALT 43 14 - 54 U/L   Comment: RESULT CONFIRMED BY MANUAL DILUTION.PMH  Alkaline Phosphatase 79 38 - 126 U/L  Total Bilirubin 1.0 0.3 - 1.2 mg/dL  GFR calc non Af Amer 18 (L) >60 mL/min  GFR calc Af Amer 20 (L) >60 mL/min   Comment: (NOTE) The eGFR has been calculated using the CKD EPI equation. This calculation has not been validated in all clinical situations. eGFR's persistently <60 mL/min signify possible Chronic Kidney Disease.   Anion gap 24 (H) 5 - 15 Troponin I (q 6hr x 3)     Status: Abnormal  Collection Time: 08/24/16  9:16 PM Result Value Ref Range  Troponin I 0.77 (HH) <0.03 ng/mL   Comment: CRITICAL VALUE NOTED. VALUE IS CONSISTENT WITH PREVIOUSLY REPORTED/CALLED VALUE.PMH APTT     Status: Abnormal  Collection Time: 08/24/16  9:17 PM Result Value Ref Range  aPTT 52 (H) 24 - 36 seconds   Comment:        IF BASELINE aPTT IS ELEVATED, SUGGEST PATIENT RISK ASSESSMENT BE USED TO DETERMINE APPROPRIATE ANTICOAGULANT THERAPY.  Lactic acid, plasma     Status: Abnormal  Collection Time: 08/24/16  9:17 PM Result Value Ref Range  Lactic Acid, Venous 14.0  (HH) 0.5 - 1.9 mmol/L   Comment: RESULT CONFIRMED BY MANUAL DILUTION.PMH CRITICAL RESULT CALLED TO, READ BACK BY AND VERIFIED WITH TANYA SILVA AT 2238 08/24/16.PMH  Fibrin derivatives D-Dimer ([facility] only)     Status: None  Collection Time: 08/24/16  9:18 PM Result Value Ref Range  Fibrin derivatives D-dimer ([facility]) >7500 0.00 - 499.00   Comment: (NOTE) <> Exclusion of Venous Thromboembolism (VTE) - OUTPATIENT ONLY   (Emergency Department or [place])   0-499 ng/ml (FEU): With a low to intermediate pretest probability                      for VTE this test result excludes the diagnosis                      of VTE.   >499 ng/ml (FEU) : VTE not excluded; additional work up for VTE is                      required. <> Testing on Inpatients and Evaluation of Disseminated Intravascular   Coagulation (DIC) Reference Range:   0-499 ng/ml (FEU)  Fibrinogen     Status: None  Collection Time: 08/24/16  9:18 PM Result Value Ref Range  Fibrinogen 253 210 - 475 mg/dL Protime-INR     Status: Abnormal  Collection Time: 08/24/16  9:18 PM Result Value Ref Range  Prothrombin Time 24.5 (H) 11.4 - 15.2 seconds  INR 2.17  Phosphorus     Status: Abnormal  Collection Time: 08/24/16  9:18 PM Result Value Ref Range  Phosphorus 6.5 (H) 2.5 - 4.6 mg/dL CBC     Status: Abnormal  Collection Time: 08/24/16  9:18 PM Result Value Ref Range  WBC 34.1 (H) 3.6 - 11.0 K/uL  RBC 3.96 3.80 - 5.20 MIL/uL  Hemoglobin 11.9 (L) 12.0 - 16.0 g/dL  HCT 36.1 35.0 - 47.0 %  MCV 91.3 80.0 -  100.0 fL  MCH 30.1 26.0 - 34.0 pg  MCHC 32.9 32.0 - 36.0 g/dL  RDW 89.7 (H) 84.7 - 84.1 %  Platelets 54 (L) 150 - 440 K/uL Magnesium     Status: Abnormal  Collection Time: 08/24/16  9:18 PM Result Value Ref Range  Magnesium 1.1 (L) 1.7 - 2.4 mg/dL Glucose, capillary     Status: Abnormal  Collection Time: 08/24/16  9:42 PM Result Value Ref Range  Glucose-Capillary 242 (H) 65 - 99 mg/dL Blood gas, arterial     Status:  Abnormal  Collection Time: 08/24/16 10:13 PM Result Value Ref Range  FIO2 0.70   Delivery systems VENTILATOR   Mode PRESSURE REGULATED VOLUME CONTROL   VT 500 mL  LHR 20 resp/min  Peep/cpap 5.0 cm H20  pH, Arterial 7.14 (LL) 7.350 - 7.450   Comment: RBV, TUKOV, M, CMM, 2225,  pCO2 arterial 28 (L) 32.0 - 48.0 mmHg  pO2, Arterial 101 83.0 - 108.0 mmHg  Bicarbonate 9.5 (L) 20.0 - 28.0 mmol/L  Acid-base deficit 18.1 (H) 0.0 - 2.0 mmol/L  O2 Saturation 95.5 %  Patient temperature 37.0   Collection site A-LINE   Sample type ARTERIAL DRAW  Glucose, capillary     Status: Abnormal  Collection Time: 08/25/16 12:17 AM Result Value Ref Range  Glucose-Capillary 196 (H) 65 - 99 mg/dL Blood gas, arterial     Status: Abnormal  Collection Time: 08/25/16 12:46 AM Result Value Ref Range  Delivery systems VENTILATOR   Mode PRESSURE REGULATED VOLUME CONTROL   VT 500 mL  Peep/cpap 5.0 cm H20  pH, Arterial 7.23 (L) 7.350 - 7.450  pCO2 arterial 31 (L) 32.0 - 48.0 mmHg  pO2, Arterial 94 83.0 - 108.0 mmHg  Bicarbonate 13.0 (L) 20.0 - 28.0 mmol/L  Acid-base deficit 13.3 (H) 0.0 - 2.0 mmol/L  O2 Saturation 95.7 %  Patient temperature 37.0   Collection site A-LINE   Sample type ARTERIAL DRAW  Glucose, capillary     Status: Abnormal  Collection Time: 08/25/16  4:28 AM Result Value Ref Range  Glucose-Capillary 268 (H) 65 - 99 mg/dL Troponin I (q 6hr x 3)     Status: Abnormal  Collection Time: 08/25/16  5:08 AM Result Value Ref Range  Troponin I 1.25 (HH) <0.03 ng/mL   Comment: CRITICAL VALUE NOTED. VALUE IS CONSISTENT WITH PREVIOUSLY REPORTED/CALLED VALUE.PMH Comprehensive metabolic panel     Status: Abnormal  Collection Time: 08/25/16  5:08 AM Result Value Ref Range  Sodium 140 135 - 145 mmol/L   Comment: ELECTROLYTES REPEATED.PMH RESULTS OBTAINED BY ULTRACENTRIFUGATION.PMH   Potassium 3.7 3.5 - 5.1 mmol/L  Chloride 95 (L) 101 - 111 mmol/L  CO2 18 (L) 22 - 32 mmol/L  Glucose, Bld 284  (H) 65 - 99 mg/dL  BUN 37 (H) 6 - 20 mg/dL  Creatinine, Ser 2.82 (H) 0.44 - 1.00 mg/dL  Calcium 6.2 (LL) 8.9 - 10.3 mg/dL   Comment: CRITICAL RESULT CALLED TO, READ BACK BY AND VERIFIED WITH BRITTON LEE RUST CHESTER AT 0813 08/25/16.PMH  Total Protein 4.8 (L) 6.5 - 8.1 g/dL   Comment: RESULTS OBTAINED BY ULTRACENTRIFUGATION.PMH  Albumin 2.3 (L) 3.5 - 5.0 g/dL  AST 887 (H) 15 - 41 U/L  ALT 48 14 - 54 U/L   Comment: RESULT CONFIRMED BY MANUAL DILUTION.PMH  Alkaline Phosphatase 60 38 - 126 U/L  Total Bilirubin 0.9 0.3 - 1.2 mg/dL  GFR calc non Af Amer 15 (L) >60 mL/min  GFR calc Af Amer 18 (L) >60  mL/min   Comment: (NOTE) The eGFR has been calculated using the CKD EPI equation. This calculation has not been validated in all clinical situations. eGFR's persistently <60 mL/min signify possible Chronic Kidney Disease.   Anion gap 27 (H) 5 - 15 Magnesium     Status: None  Collection Time: 08/25/16  5:08 AM Result Value Ref Range  Magnesium 2.3 1.7 - 2.4 mg/dL   Comment: LIPEMIC SPECIMEN, RESULTS MAY BE AFFECTED. Phosphorus     Status: Abnormal  Collection Time: 08/25/16  5:08 AM Result Value Ref Range  Phosphorus 6.4 (H) 2.5 - 4.6 mg/dL CBC with Differential/Platelet     Status: Abnormal  Collection Time: 08/25/16  5:08 AM Result Value Ref Range  WBC 32.2 (H) 3.6 - 11.0 K/uL  RBC 3.71 (L) 3.80 - 5.20 MIL/uL  Hemoglobin 11.3 (L) 12.0 - 16.0 g/dL  HCT 32.9 (L) 35.0 - 47.0 %  MCV 88.6 80.0 - 100.0 fL  MCH 30.4 26.0 - 34.0 pg  MCHC 34.3 32.0 - 36.0 g/dL  RDW 16.7 (H) 11.5 - 14.5 %  Platelets 32 (L) 150 - 440 K/uL  Neutrophils Relative % 49 %  Lymphocytes Relative 11 %  Monocytes Relative 5 %  Eosinophils Relative 0 %  Basophils Relative 0 %  Band Neutrophils 17 %  Metamyelocytes Relative 18 %  Myelocytes 0 %  Promyelocytes Absolute 0 %  Blasts 0 %  nRBC 1 (H) 0 /100 WBC  Other 0 %  Neutro Abs 27.1 (H) 1.4 - 6.5 K/uL  Lymphs Abs 3.5 1.0 - 3.6 K/uL  Monocytes Absolute 1.6  (H) 0.2 - 0.9 K/uL  Eosinophils Absolute 0.0 0 - 0.7 K/uL  Basophils Absolute 0.0 0 - 0.1 K/uL  RBC Morphology MIXED RBC POPULATION   WBC Morphology VACUOLATED NEUTROPHILS  Blood gas, arterial     Status: Abnormal  Collection Time: 08/25/16  5:08 AM Result Value Ref Range  FIO2 0.70   Delivery systems VENTILATOR   Mode PRESSURE REGULATED VOLUME CONTROL   VT 500 mL  LHR 20 resp/min  Peep/cpap 5.0 cm H20  pH, Arterial 7.35 7.350 - 7.450  pCO2 arterial 35 32.0 - 48.0 mmHg  pO2, Arterial 75 (L) 83.0 - 108.0 mmHg  Bicarbonate 19.3 (L) 20.0 - 28.0 mmol/L  Acid-base deficit 5.6 (H) 0.0 - 2.0 mmol/L  O2 Saturation 94.1 %  Patient temperature 37.0   Collection site A-LINE   Sample type ARTERIAL DRAW  Glucose, capillary     Status: Abnormal  Collection Time: 08/25/16  5:11 AM Result Value Ref Range  Glucose-Capillary 271 (H) 65 - 99 mg/dL Lactic acid, plasma     Status: Abnormal  Collection Time: 08/25/16  5:15 AM Result Value Ref Range  Lactic Acid, Venous 14.7 (HH) 0.5 - 1.9 mmol/L   Comment: RESULT CONFIRMED BY MANUAL DILUTION.PMH CRITICAL RESULT CALLED TO, READ BACK BY AND VERIFIED WITH BRITTON LEE RUST CHESTER AT [phone] 08/25/16.PMH  Glucose, capillary     Status: Abnormal  Collection Time: 08/25/16  6:05 AM Result Value Ref Range  Glucose-Capillary 176 (H) 65 - 99 mg/dL Glucose, capillary     Status: Abnormal  Collection Time: 08/25/16  6:59 AM Result Value Ref Range  Glucose-Capillary 205 (H) 65 - 99 mg/dL Culture, respiratory (NON-Expectorated)     Status: None  Collection Time: 08/25/16  7:00 AM Result Value Ref Range  Specimen Description TRACHEAL ASPIRATE   Special Requests NONE   Gram Stain     FEW WBC PRESENT, PREDOMINANTLY PMN FEW  SQUAMOUS EPITHELIAL CELLS PRESENT NO ORGANISMS SEEN   Culture     Consistent with normal respiratory flora. Performed at [facility], [address], [place], [place] [address]   Report Status 08/27/2016 FINAL  Glucose,  capillary     Status: Abnormal  Collection Time: 08/25/16  8:05 AM Result Value Ref Range  Glucose-Capillary 193 (H) 65 - 99 mg/dL Lactic acid, plasma     Status: Abnormal  Collection Time: 08/25/16  8:47 AM Result Value Ref Range  Lactic Acid, Venous 15.2 (HH) 0.5 - 1.9 mmol/L   Comment: RESULT CONFIRMED BY MANUAL DILUTION ALV CRITICAL RESULT CALLED TO, READ BACK BY AND VERIFIED WITH RACHEL VERDI AT 1020 ON 08/25/16 ALV  Protime-INR     Status: Abnormal  Collection Time: 08/25/16  8:47 AM Result Value Ref Range  Prothrombin Time 24.8 (H) 11.4 - 15.2 seconds  INR 2.20  Glucose, capillary     Status: Abnormal  Collection Time: 08/25/16  9:09 AM Result Value Ref Range  Glucose-Capillary 231 (H) 65 - 99 mg/dL Glucose, capillary     Status: Abnormal  Collection Time: 08/25/16 10:15 AM Result Value Ref Range  Glucose-Capillary 235 (H) 65 - 99 mg/dL Glucose, capillary     Status: Abnormal  Collection Time: 08/25/16 11:20 AM Result Value Ref Range  Glucose-Capillary 221 (H) 65 - 99 mg/dL ECHOCARDIOGRAM COMPLETE     Status: None  Collection Time: 08/25/16 11:31 AM Result Value Ref Range  Weight 3,160.51 oz  Height 68 in  BP 96/64 mmHg Glucose, capillary     Status: Abnormal  Collection Time: 08/25/16 12:26 PM Result Value Ref Range  Glucose-Capillary 202 (H) 65 - 99 mg/dL Glucose, capillary     Status: Abnormal  Collection Time: 08/25/16  1:32 PM Result Value Ref Range  Glucose-Capillary 190 (H) 65 - 99 mg/dL Glucose, capillary     Status: Abnormal  Collection Time: 08/25/16  2:35 PM Result Value Ref Range  Glucose-Capillary 195 (H) 65 - 99 mg/dL Glucose, capillary     Status: Abnormal  Collection Time: 08/25/16  3:38 PM Result Value Ref Range  Glucose-Capillary 190 (H) 65 - 99 mg/dL Renal function     Status: Abnormal  Collection Time: 08/25/16  4:09 PM Result Value Ref Range  Sodium 142 135 - 145 mmol/L  Potassium 3.1 (L) 3.5 - 5.1 mmol/L  Chloride 98 (L) 101 - 111  mmol/L  CO2 29 22 - 32 mmol/L  Glucose, Bld 183 (H) 65 - 99 mg/dL  BUN 38 (H) 6 - 20 mg/dL  Creatinine, Ser 4.84 (H) 0.44 - 1.00 mg/dL  Calcium 6.1 (LL) 8.9 - 10.3 mg/dL   Comment: CRITICAL RESULT CALLED TO, READ BACK BY AND VERIFIED WITH RACHEL VERDI AT 1727 ON 08/25/16 BY SNJ   Phosphorus 4.0 2.5 - 4.6 mg/dL  Albumin 2.3 (L) 3.5 - 5.0 g/dL  GFR calc non Af Amer 16 (L) >60 mL/min  GFR calc Af Amer 19 (L) >60 mL/min   Comment: (NOTE) The eGFR has been calculated using the CKD EPI equation. This calculation has not been validated in all clinical situations. eGFR's persistently <60 mL/min signify possible Chronic Kidney Disease.   Anion gap 15 5 - 15 Magnesium     Status: None  Collection Time: 08/25/16  4:09 PM Result Value Ref Range  Magnesium 2.0 1.7 - 2.4 mg/dL Glucose, capillary     Status: Abnormal  Collection Time: 08/25/16  4:23 PM Result Value Ref Range  Glucose-Capillary 175 (H)  65 - 99 mg/dL Glucose, capillary     Status: Abnormal  Collection Time: 08/25/16  5:26 PM Result Value Ref Range  Glucose-Capillary 166 (H) 65 - 99 mg/dL Glucose, capillary     Status: Abnormal  Collection Time: 08/25/16  6:30 PM Result Value Ref Range  Glucose-Capillary 138 (H) 65 - 99 mg/dL Glucose, capillary     Status: Abnormal  Collection Time: 08/25/16  7:41 PM Result Value Ref Range  Glucose-Capillary 171 (H) 65 - 99 mg/dL Glucose, capillary     Status: Abnormal  Collection Time: 08/25/16  8:46 PM Result Value Ref Range  Glucose-Capillary 131 (H) 65 - 99 mg/dL Renal function     Status: Abnormal  Collection Time: 08/25/16  9:13 PM Result Value Ref Range  Sodium 141 135 - 145 mmol/L  Potassium 3.5 3.5 - 5.1 mmol/L  Chloride 99 (L) 101 - 111 mmol/L  CO2 29 22 - 32 mmol/L  Glucose, Bld 153 (H) 65 - 99 mg/dL  BUN 35 (H) 6 - 20 mg/dL  Creatinine, Ser 3.03 (H) 0.44 - 1.00 mg/dL  Calcium 6.7 (L) 8.9 - 10.3 mg/dL  Phosphorus 3.8 2.5 - 4.6 mg/dL  Albumin 2.4 (L) 3.5 - 5.0 g/dL  GFR  calc non Af Amer 18 (L) >60 mL/min  GFR calc Af Amer 20 (L) >60 mL/min   Comment: (NOTE) The eGFR has been calculated using the CKD EPI equation. This calculation has not been validated in all clinical situations. eGFR's persistently <60 mL/min signify possible Chronic Kidney Disease.   Anion gap 13 5 - 15 Magnesium     Status: None  Collection Time: 08/25/16  9:13 PM Result Value Ref Range  Magnesium 1.9 1.7 - 2.4 mg/dL Glucose, capillary     Status: Abnormal  Collection Time: 08/25/16  9:24 PM Result Value Ref Range  Glucose-Capillary 149 (H) 65 - 99 mg/dL Glucose, capillary     Status: Abnormal  Collection Time: 08/25/16 10:29 PM Result Value Ref Range  Glucose-Capillary 155 (H) 65 - 99 mg/dL Glucose, capillary     Status: Abnormal  Collection Time: 08/25/16 11:39 PM Result Value Ref Range  Glucose-Capillary 107 (H) 65 - 99 mg/dL Glucose, capillary     Status: Abnormal  Collection Time: 08/26/16 12:45 AM Result Value Ref Range  Glucose-Capillary 145 (H) 65 - 99 mg/dL Glucose, capillary     Status: Abnormal  Collection Time: 08/26/16  1:50 AM Result Value Ref Range  Glucose-Capillary 304 (H) 65 - 99 mg/dL Glucose, capillary     Status: Abnormal  Collection Time: 08/26/16  2:02 AM Result Value Ref Range  Glucose-Capillary 168 (H) 65 - 99 mg/dL Renal function     Status: Abnormal  Collection Time: 08/26/16  2:30 AM Result Value Ref Range  Sodium 140 135 - 145 mmol/L  Potassium 3.7 3.5 - 5.1 mmol/L  Chloride 100 (L) 101 - 111 mmol/L  CO2 27 22 - 32 mmol/L  Glucose, Bld 178 (H) 65 - 99 mg/dL  BUN 34 (H) 6 - 20 mg/dL  Creatinine, Ser 5.58 (H) 0.44 - 1.00 mg/dL  Calcium 6.9 (L) 8.9 - 10.3 mg/dL  Phosphorus 3.8 2.5 - 4.6 mg/dL  Albumin 2.3 (L) 3.5 - 5.0 g/dL  GFR calc non Af Amer 20 (L) >60 mL/min  GFR calc Af Amer 23 (L) >60 mL/min   Comment: (NOTE) The eGFR has been calculated using the CKD EPI equation. This calculation has not been validated in all clinical  situations. eGFR's persistently <60 mL/min signify  possible Chronic Kidney Disease.   Anion gap 13 5 - 15 Magnesium     Status: None  Collection Time: 08/26/16  2:30 AM Result Value Ref Range  Magnesium 1.8 1.7 - 2.4 mg/dL Glucose, capillary     Status: Abnormal  Collection Time: 08/26/16  3:07 AM Result Value Ref Range  Glucose-Capillary 166 (H) 65 - 99 mg/dL Glucose, capillary     Status: Abnormal  Collection Time: 08/26/16  4:17 AM Result Value Ref Range  Glucose-Capillary 189 (H) 65 - 99 mg/dL Glucose, capillary     Status: Abnormal  Collection Time: 08/26/16  5:26 AM Result Value Ref Range  Glucose-Capillary 160 (H) 65 - 99 mg/dL Phosphorus     Status: None  Collection Time: 08/26/16  5:32 AM Result Value Ref Range  Phosphorus 3.6 2.5 - 4.6 mg/dL CBC with Differential/Platelet     Status: Abnormal  Collection Time: 08/26/16  5:32 AM Result Value Ref Range  WBC 39.4 (H) 3.6 - 11.0 K/uL  RBC 3.66 (L) 3.80 - 5.20 MIL/uL  Hemoglobin 10.6 (L) 12.0 - 16.0 g/dL  HCT 32.0 (L) 35.0 - 47.0 %  MCV 87.3 80.0 - 100.0 fL  MCH 28.9 26.0 - 34.0 pg  MCHC 33.2 32.0 - 36.0 g/dL  RDW 16.5 (H) 11.5 - 14.5 %  Platelets 18 (LL) 150 - 440 K/uL   Comment: PLATELET COUNT CONFIRMED BY SMEAR CRITICAL RESULT CALLED TO, READ BACK BY AND VERIFIED WITH: CHELSIE WRENN ON 08/26/16 AT 0641 BY JAG   Neutrophils Relative % 64 %  Lymphocytes Relative 3 %  Monocytes Relative 8 %  Eosinophils Relative 0 %  Basophils Relative 0 %  Band Neutrophils 17 %  Metamyelocytes Relative 7 %  Myelocytes 1 %  Promyelocytes Absolute 0 %  Blasts 0 %  nRBC 0 0 /100 WBC  Other 0 %  Neutro Abs 35.0 (H) 1.4 - 6.5 K/uL  Lymphs Abs 1.2 1.0 - 3.6 K/uL  Monocytes Absolute 3.2 (H) 0.2 - 0.9 K/uL  Eosinophils Absolute 0.0 0 - 0.7 K/uL  Basophils Absolute 0.0 0 - 0.1 K/uL  Smear Review MORPHOLOGY UNREMARKABLE  Basic metabolic panel     Status: Abnormal  Collection Time: 08/26/16  5:32 AM Result Value Ref  Range  Sodium 140 135 - 145 mmol/L  Potassium 3.7 3.5 - 5.1 mmol/L  Chloride 101 101 - 111 mmol/L  CO2 28 22 - 32 mmol/L  Glucose, Bld 164 (H) 65 - 99 mg/dL  BUN 34 (H) 6 - 20 mg/dL  Creatinine, Ser 2.38 (H) 0.44 - 1.00 mg/dL  Calcium 7.1 (L) 8.9 - 10.3 mg/dL  GFR calc non Af Amer 20 (L) >60 mL/min  GFR calc Af Amer 23 (L) >60 mL/min   Comment: (NOTE) The eGFR has been calculated using the CKD EPI equation. This calculation has not been validated in all clinical situations. eGFR's persistently <60 mL/min signify possible Chronic Kidney Disease.   Anion gap 11 5 - 15 APTT     Status: Abnormal  Collection Time: 08/26/16  5:32 AM Result Value Ref Range  aPTT 40 (H) 24 - 36 seconds   Comment:        IF BASELINE aPTT IS ELEVATED, SUGGEST PATIENT RISK ASSESSMENT BE USED TO DETERMINE APPROPRIATE ANTICOAGULANT THERAPY.  Renal function     Status: Abnormal  Collection Time: 08/26/16  5:32 AM Result Value Ref Range  Sodium 140 135 - 145 mmol/L  Potassium 3.7 3.5 - 5.1 mmol/L  Chloride 101 101 -  111 mmol/L  CO2 29 22 - 32 mmol/L  Glucose, Bld 165 (H) 65 - 99 mg/dL  BUN 35 (H) 6 - 20 mg/dL  Creatinine, Ser 2.40 (H) 0.44 - 1.00 mg/dL  Calcium 7.1 (L) 8.9 - 10.3 mg/dL  Phosphorus 3.6 2.5 - 4.6 mg/dL  Albumin 2.3 (L) 3.5 - 5.0 g/dL  GFR calc non Af Amer 20 (L) >60 mL/min  GFR calc Af Amer 23 (L) >60 mL/min   Comment: (NOTE) The eGFR has been calculated using the CKD EPI equation. This calculation has not been validated in all clinical situations. eGFR's persistently <60 mL/min signify possible Chronic Kidney Disease.   Anion gap 10 5 - 15 Magnesium     Status: None  Collection Time: 08/26/16  5:32 AM Result Value Ref Range  Magnesium 1.7 1.7 - 2.4 mg/dL Fibrin derivatives D-Dimer ([facility] only)     Status: Abnormal  Collection Time: 08/26/16  5:32 AM Result Value Ref Range  Fibrin derivatives D-dimer ([facility]) >7,500.00 (H) 0.00 - 499.00   Comment: (NOTE) <> Exclusion of Venous  Thromboembolism (VTE) - OUTPATIENT ONLY   (Emergency Department or [place])   0-499 ng/ml (FEU): With a low to intermediate pretest probability                      for VTE this test result excludes the diagnosis                      of VTE.   >499 ng/ml (FEU) : VTE not excluded; additional work up for VTE is                      required. <> Testing on Inpatients and Evaluation of Disseminated Intravascular   Coagulation (DIC) Reference Range:   0-499 ng/ml (FEU)  Fibrinogen     Status: Abnormal  Collection Time: 08/26/16  5:32 AM Result Value Ref Range  Fibrinogen 500 (H) 210 - 475 mg/dL Protime-INR     Status: Abnormal  Collection Time: 08/26/16  5:32 AM Result Value Ref Range  Prothrombin Time 17.7 (H) 11.4 - 15.2 seconds  INR 1.44  Glucose, capillary     Status: Abnormal  Collection Time: 08/26/16  6:31 AM Result Value Ref Range  Glucose-Capillary 146 (H) 65 - 99 mg/dL Glucose, capillary     Status: Abnormal  Collection Time: 08/26/16  7:13 AM Result Value Ref Range  Glucose-Capillary 25 (LL) 65 - 99 mg/dL   Comment: QUESTIONABLE RESULTS - CHARGE CREDITED REPEATED TO VERIFY Performed at [facility], [address] [place], [place] [address]   Comment 1 Repeat Test  Glucose, capillary     Status: Abnormal  Collection Time: 08/26/16  7:15 AM Result Value Ref Range  Glucose-Capillary 63 (L) 65 - 99 mg/dL Glucose, capillary     Status: Abnormal  Collection Time: 08/26/16  7:17 AM Result Value Ref Range  Glucose-Capillary 52 (L) 65 - 99 mg/dL Glucose, random     Status: Abnormal  Collection Time: 08/26/16  7:25 AM Result Value Ref Range  Glucose, Bld 136 (H) 65 - 99 mg/dL Glucose, capillary     Status: Abnormal  Collection Time: 08/26/16  9:04 AM Result Value Ref Range  Glucose-Capillary 191 (H) 65 - 99 mg/dL Renal function     Status: Abnormal  Collection Time: 08/26/16  9:16 AM Result Value Ref Range  Sodium 139 135 - 145 mmol/L  Potassium 3.6 3.5 - 5.1  mmol/L  Chloride 103 101 - 111 mmol/L  CO2 26 22 - 32 mmol/L  Glucose, Bld 182 (H) 65 - 99 mg/dL  BUN 33 (H) 6 - 20 mg/dL  Creatinine, Ser 2.36 (H) 0.44 - 1.00 mg/dL  Calcium 7.1 (L) 8.9 - 10.3 mg/dL  Phosphorus 3.3 2.5 - 4.6 mg/dL  Albumin 2.2 (L) 3.5 - 5.0 g/dL  GFR calc non Af Amer 20 (L) >60 mL/min  GFR calc Af Amer 23 (L) >60 mL/min   Comment: (NOTE) The eGFR has been calculated using the CKD EPI equation. This calculation has not been validated in all clinical situations. eGFR's persistently <60 mL/min signify possible Chronic Kidney Disease.   Anion gap 10 5 - 15 Magnesium     Status: None  Collection Time: 08/26/16  9:16 AM Result Value Ref Range  Magnesium 1.8 1.7 - 2.4 mg/dL Procalcitonin     Status: None  Collection Time: 08/26/16  9:16 AM Result Value Ref Range  Procalcitonin >150.00 ng/mL   Comment:        Interpretation: PCT >= 10 ng/mL: Important systemic inflammatory response, almost exclusively due to severe bacterial sepsis or septic shock. (NOTE)         ICU PCT Algorithm               Non ICU PCT Algorithm    ----------------------------     ------------------------------         PCT < 0.25 ng/mL                 PCT < 0.1 ng/mL     Stopping of antibiotics            Stopping of antibiotics       strongly encouraged.               strongly encouraged.    ----------------------------     ------------------------------       PCT level decrease by               PCT < 0.25 ng/mL       >= 80% from peak PCT       OR PCT 0.25 - 0.5 ng/mL          Stopping of antibiotics                                             encouraged.     Stopping of antibiotics           encouraged.    ----------------------------     ------------------------------       PCT level decrease by              PCT >= 0.25 ng/mL       < 80% from peak PCT        AND PCT >= 0.5 ng/mL             Continuing antibiotics                                              encouraged.        Continuing antibiotics            encouraged.    ----------------------------     ------------------------------  PCT level increase compared          PCT > 0.5 ng/mL         with peak PCT AND          PCT >= 0.5 ng/mL             Escalation of antibiotics                                          strongly encouraged.      Escalation of antibiotics        strongly encouraged.  Glucose, capillary     Status: Abnormal  Collection Time: 08/26/16 10:19 AM Result Value Ref Range  Glucose-Capillary 186 (H) 65 - 99 mg/dL Glucose, capillary     Status: Abnormal  Collection Time: 08/26/16 11:07 AM Result Value Ref Range  Glucose-Capillary 163 (H) 65 - 99 mg/dL Glucose, capillary     Status: Abnormal  Collection Time: 08/26/16 12:23 PM Result Value Ref Range  Glucose-Capillary 171 (H) 65 - 99 mg/dL Renal function     Status: Abnormal  Collection Time: 08/26/16 12:32 PM Result Value Ref Range  Sodium 139 135 - 145 mmol/L  Potassium 4.0 3.5 - 5.1 mmol/L  Chloride 101 101 - 111 mmol/L  CO2 26 22 - 32 mmol/L  Glucose, Bld 118 (H) 65 - 99 mg/dL  BUN 33 (H) 6 - 20 mg/dL  Creatinine, Ser 5.56 (H) 0.44 - 1.00 mg/dL  Calcium 7.4 (L) 8.9 - 10.3 mg/dL  Phosphorus 3.4 2.5 - 4.6 mg/dL  Albumin 2.4 (L) 3.5 - 5.0 g/dL  GFR calc non Af Amer 23 (L) >60 mL/min  GFR calc Af Amer 26 (L) >60 mL/min   Comment: (NOTE) The eGFR has been calculated using the CKD EPI equation. This calculation has not been validated in all clinical situations. eGFR's persistently <60 mL/min signify possible Chronic Kidney Disease.   Anion gap 12 5 - 15 Magnesium     Status: None  Collection Time: 08/26/16  4:14 PM Result Value Ref Range  Magnesium 1.9 1.7 - 2.4 mg/dL Glucose, capillary     Status: Abnormal  Collection Time: 08/26/16  4:18 PM Result Value Ref Range  Glucose-Capillary 229 (H) 65 - 99 mg/dL Glucose, capillary     Status: Abnormal  Collection Time: 08/26/16  7:48 PM Result Value Ref  Range  Glucose-Capillary 206 (H) 65 - 99 mg/dL C difficile quick scan w PCR reflex     Status: None  Collection Time: 08/26/16  8:05 PM Result Value Ref Range  C Diff antigen NEGATIVE NEGATIVE  C Diff toxin NEGATIVE NEGATIVE  C Diff interpretation No C. difficile detected.  Renal function     Status: Abnormal  Collection Time: 08/26/16  9:34 PM Result Value Ref Range  Sodium 140 135 - 145 mmol/L  Potassium 3.8 3.5 - 5.1 mmol/L  Chloride 103 101 - 111 mmol/L  CO2 27 22 - 32 mmol/L  Glucose, Bld 234 (H) 65 - 99 mg/dL  BUN 37 (H) 6 - 20 mg/dL  Creatinine, Ser 3.46 (H) 0.44 - 1.00 mg/dL  Calcium 7.9 (L) 8.9 - 10.3 mg/dL  Phosphorus 3.3 2.5 - 4.6 mg/dL  Albumin 2.3 (L) 3.5 - 5.0 g/dL  GFR calc non Af Amer 23 (L) >60 mL/min  GFR calc Af Amer 26 (L) >60 mL/min   Comment: (NOTE) The eGFR has been calculated  using the CKD EPI equation. This calculation has not been validated in all clinical situations. eGFR's persistently <60 mL/min signify possible Chronic Kidney Disease.   Anion gap 10 5 - 15 Magnesium     Status: None  Collection Time: 08/26/16  9:34 PM Result Value Ref Range  Magnesium 1.8 1.7 - 2.4 mg/dL Glucose, capillary     Status: Abnormal  Collection Time: 08/27/16 12:34 AM Result Value Ref Range  Glucose-Capillary 246 (H) 65 - 99 mg/dL Renal function     Status: Abnormal  Collection Time: 08/27/16  1:15 AM Result Value Ref Range  Sodium 141 135 - 145 mmol/L  Potassium 3.6 3.5 - 5.1 mmol/L  Chloride 107 101 - 111 mmol/L  CO2 26 22 - 32 mmol/L  Glucose, Bld 230 (H) 65 - 99 mg/dL  BUN 37 (H) 6 - 20 mg/dL  Creatinine, Ser 2.07 (H) 0.44 - 1.00 mg/dL  Calcium 7.2 (L) 8.9 - 10.3 mg/dL  Phosphorus 3.3 2.5 - 4.6 mg/dL  Albumin 2.1 (L) 3.5 - 5.0 g/dL  GFR calc non Af Amer 23 (L) >60 mL/min  GFR calc Af Amer 27 (L) >60 mL/min   Comment: (NOTE) The eGFR has been calculated using the CKD EPI equation. This calculation has not been validated in all clinical  situations. eGFR's persistently <60 mL/min signify possible Chronic Kidney Disease.   Anion gap 8 5 - 15 Magnesium     Status: None  Collection Time: 08/27/16  1:15 AM Result Value Ref Range  Magnesium 1.8 1.7 - 2.4 mg/dL Glucose, capillary     Status: Abnormal  Collection Time: 08/27/16  4:16 AM Result Value Ref Range  Glucose-Capillary 255 (H) 65 - 99 mg/dL Phosphorus     Status: None  Collection Time: 08/27/16  5:05 AM Result Value Ref Range  Phosphorus 3.2 2.5 - 4.6 mg/dL Comprehensive metabolic panel     Status: Abnormal  Collection Time: 08/27/16  5:05 AM Result Value Ref Range  Sodium 138 135 - 145 mmol/L  Potassium 4.0 3.5 - 5.1 mmol/L  Chloride 103 101 - 111 mmol/L  CO2 27 22 - 32 mmol/L  Glucose, Bld 276 (H) 65 - 99 mg/dL  BUN 40 (H) 6 - 20 mg/dL  Creatinine, Ser 1.97 (H) 0.44 - 1.00 mg/dL  Calcium 7.6 (L) 8.9 - 10.3 mg/dL  Total Protein 5.5 (L) 6.5 - 8.1 g/dL  Albumin 2.2 (L) 3.5 - 5.0 g/dL  AST 69 (H) 15 - 41 U/L  ALT 55 (H) 14 - 54 U/L  Alkaline Phosphatase 98 38 - 126 U/L  Total Bilirubin 0.7 0.3 - 1.2 mg/dL  GFR calc non Af Amer 25 (L) >60 mL/min  GFR calc Af Amer 29 (L) >60 mL/min   Comment: (NOTE) The eGFR has been calculated using the CKD EPI equation. This calculation has not been validated in all clinical situations. eGFR's persistently <60 mL/min signify possible Chronic Kidney Disease.   Anion gap 8 5 - 15 Magnesium     Status: None  Collection Time: 08/27/16  5:05 AM Result Value Ref Range  Magnesium 1.9 1.7 - 2.4 mg/dL CBC with Differential/Platelet     Status: Abnormal  Collection Time: 08/27/16  6:20 AM Result Value Ref Range  WBC 31.3 (H) 3.6 - 11.0 K/uL  RBC 3.45 (L) 3.80 - 5.20 MIL/uL  Hemoglobin 9.9 (L) 12.0 - 16.0 g/dL  HCT 30.1 (L) 35.0 - 47.0 %  MCV 87.2 80.0 - 100.0 fL  MCH 28.6 26.0 - 34.0 pg  MCHC 32.8 32.0 - 36.0 g/dL  RDW 16.9 (H) 11.5 - 14.5 %  Platelets 18 (LL) 150 - 440 K/uL   Comment: CRITICAL VALUE NOTED.  VALUE IS  CONSISTENT WITH PREVIOUSLY REPORTED AND CALLED VALUE.  Neutrophils Relative % 90 %  Lymphocytes Relative 7 %  Monocytes Relative 3 %  Eosinophils Relative 0 %  Basophils Relative 0 %  Neutro Abs 28.2 (H) 1.4 - 6.5 K/uL  Lymphs Abs 2.2 1.0 - 3.6 K/uL  Monocytes Absolute 0.9 0.2 - 0.9 K/uL  Eosinophils Absolute 0.0 0 - 0.7 K/uL  Basophils Absolute 0.0 0 - 0.1 K/uL APTT     Status: None  Collection Time: 08/27/16  6:20 AM Result Value Ref Range  aPTT 33 24 - 36 seconds Glucose, capillary     Status: Abnormal  Collection Time: 08/27/16  7:49 AM Result Value Ref Range  Glucose-Capillary <10 (LL) 65 - 99 mg/dL Glucose, capillary     Status: Abnormal  Collection Time: 08/27/16  8:21 AM Result Value Ref Range  Glucose-Capillary 20 (LL) 65 - 99 mg/dL Glucose, capillary     Status: Abnormal  Collection Time: 08/27/16  8:41 AM Result Value Ref Range  Glucose-Capillary <10 (LL) 65 - 99 mg/dL   Comment: QUESTIONABLE RESULTS - CHARGE CREDITED REPEATED TO VERIFY Performed at [facility], [address] [place], [place] [address]  Glucose, capillary     Status: Abnormal  Collection Time: 08/27/16  8:43 AM Result Value Ref Range  Glucose-Capillary 303 (H) 65 - 99 mg/dL Magnesium     Status: None  Collection Time: 08/27/16 10:09 AM Result Value Ref Range  Magnesium 1.9 1.7 - 2.4 mg/dL Renal function     Status: Abnormal  Collection Time: 08/27/16 10:09 AM Result Value Ref Range  Sodium 138 135 - 145 mmol/L  Potassium 3.9 3.5 - 5.1 mmol/L  Chloride 103 101 - 111 mmol/L  CO2 26 22 - 32 mmol/L  Glucose, Bld 276 (H) 65 - 99 mg/dL  BUN 39 (H) 6 - 20 mg/dL  Creatinine, Ser 2.06 (H) 0.44 - 1.00 mg/dL  Calcium 8.0 (L) 8.9 - 10.3 mg/dL  Phosphorus 2.8 2.5 - 4.6 mg/dL  Albumin 2.2 (L) 3.5 - 5.0 g/dL  GFR calc non Af Amer 24 (L) >60 mL/min  GFR calc Af Amer 27 (L) >60 mL/min   Comment: (NOTE) The eGFR has been calculated using the CKD EPI equation. This calculation has not been  validated in all clinical situations. eGFR's persistently <60 mL/min signify possible Chronic Kidney Disease.   Anion gap 9 5 - 15 Glucose, capillary     Status: Abnormal  Collection Time: 08/27/16 11:27 AM Result Value Ref Range  Glucose-Capillary 258 (H) 65 - 99 mg/dL Glucose, capillary     Status: Abnormal  Collection Time: 08/27/16  3:43 PM Result Value Ref Range  Glucose-Capillary 231 (H) 65 - 99 mg/dL Renal function     Status: Abnormal  Collection Time: 08/27/16  4:23 PM Result Value Ref Range  Sodium 137 135 - 145 mmol/L  Potassium 4.0 3.5 - 5.1 mmol/L  Chloride 104 101 - 111 mmol/L  CO2 26 22 - 32 mmol/L  Glucose, Bld 228 (H) 65 - 99 mg/dL  BUN 39 (H) 6 - 20 mg/dL  Creatinine, Ser 1.82 (H) 0.44 - 1.00 mg/dL  Calcium 7.9 (L) 8.9 - 10.3 mg/dL  Phosphorus 2.7 2.5 - 4.6 mg/dL  Albumin 2.2 (L) 3.5 - 5.0 g/dL  GFR calc non Af Amer 27 (  L) >60 mL/min  GFR calc Af Amer 32 (L) >60 mL/min   Comment: (NOTE) The eGFR has been calculated using the CKD EPI equation. This calculation has not been validated in all clinical situations. eGFR's persistently <60 mL/min signify possible Chronic Kidney Disease.   Anion gap 7 5 - 15 Magnesium     Status: None  Collection Time: 08/27/16  4:23 PM Result Value Ref Range  Magnesium 1.9 1.7 - 2.4 mg/dL Glucose, capillary     Status: Abnormal  Collection Time: 08/27/16  8:08 PM Result Value Ref Range  Glucose-Capillary 153 (H) 65 - 99 mg/dL Renal function     Status: Abnormal  Collection Time: 08/27/16  9:52 PM Result Value Ref Range  Sodium 139 135 - 145 mmol/L  Potassium 4.0 3.5 - 5.1 mmol/L  Chloride 104 101 - 111 mmol/L  CO2 27 22 - 32 mmol/L  Glucose, Bld 221 (H) 65 - 99 mg/dL  BUN 43 (H) 6 - 20 mg/dL  Creatinine, Ser 1.96 (H) 0.44 - 1.00 mg/dL  Calcium 8.0 (L) 8.9 - 10.3 mg/dL  Phosphorus 2.6 2.5 - 4.6 mg/dL  Albumin 2.3 (L) 3.5 - 5.0 g/dL  GFR calc non Af Amer 25 (L) >60 mL/min  GFR calc Af Amer 29 (L) >60  mL/min   Comment: (NOTE) The eGFR has been calculated using the CKD EPI equation. This calculation has not been validated in all clinical situations. eGFR's persistently <60 mL/min signify possible Chronic Kidney Disease.   Anion gap 8 5 - 15 Magnesium     Status: None  Collection Time: 08/27/16  9:52 PM Result Value Ref Range  Magnesium 1.8 1.7 - 2.4 mg/dL Glucose, capillary     Status: Abnormal  Collection Time: 08/27/16 11:24 PM Result Value Ref Range  Glucose-Capillary 212 (H) 65 - 99 mg/dL Glucose, capillary     Status: Abnormal  Collection Time: 08/28/16  3:39 AM Result Value Ref Range  Glucose-Capillary 126 (H) 65 - 99 mg/dL Phosphorus     Status: None  Collection Time: 08/28/16  4:20 AM Result Value Ref Range  Phosphorus 2.5 2.5 - 4.6 mg/dL CBC with Differential/Platelet     Status: Abnormal  Collection Time: 08/28/16  4:20 AM Result Value Ref Range  WBC 34.0 (H) 3.6 - 11.0 K/uL  RBC 3.36 (L) 3.80 - 5.20 MIL/uL  Hemoglobin 9.8 (L) 12.0 - 16.0 g/dL  HCT 29.8 (L) 35.0 - 47.0 %  MCV 88.6 80.0 - 100.0 fL  MCH 29.0 26.0 - 34.0 pg  MCHC 32.8 32.0 - 36.0 g/dL  RDW 17.2 (H) 11.5 - 14.5 %  Platelets 17 (LL) 150 - 440 K/uL   Comment: CRITICAL VALUE NOTED.  VALUE IS CONSISTENT WITH PREVIOUSLY REPORTED AND CALLED VALUE.  Neutrophils Relative % 90 %  Lymphocytes Relative 8 %  Monocytes Relative 0 %  Eosinophils Relative 1 %  Basophils Relative 1 %  Neutro Abs 30.7 (H) 1.4 - 6.5 K/uL  Lymphs Abs 2.7 1.0 - 3.6 K/uL  Monocytes Absolute 0.0 (L) 0.2 - 0.9 K/uL  Eosinophils Absolute 0.3 0 - 0.7 K/uL  Basophils Absolute 0.3 (H) 0 - 0.1 K/uL  Smear Review MORPHOLOGY UNREMARKABLE  Comprehensive metabolic panel     Status: Abnormal  Collection Time: 08/28/16  4:20 AM Result Value Ref Range  Sodium 140 135 - 145 mmol/L  Potassium 3.8 3.5 - 5.1 mmol/L  Chloride 107 101 - 111 mmol/L  CO2 27 22 - 32 mmol/L  Glucose, Bld 213 (H) 65 -  99 mg/dL  BUN 52 (H) 6 - 20 mg/dL  Creatinine,  Ser 2.21 (H) 0.44 - 1.00 mg/dL  Calcium 7.6 (L) 8.9 - 10.3 mg/dL  Total Protein 5.3 (L) 6.5 - 8.1 g/dL  Albumin 2.0 (L) 3.5 - 5.0 g/dL  AST 111 (H) 15 - 41 U/L  ALT 57 (H) 14 - 54 U/L  Alkaline Phosphatase 130 (H) 38 - 126 U/L  Total Bilirubin 0.6 0.3 - 1.2 mg/dL  GFR calc non Af Amer 22 (L) >60 mL/min  GFR calc Af Amer 25 (L) >60 mL/min   Comment: (NOTE) The eGFR has been calculated using the CKD EPI equation. This calculation has not been validated in all clinical situations. eGFR's persistently <60 mL/min signify possible Chronic Kidney Disease.   Anion gap 6 5 - 15 Procalcitonin     Status: None  Collection Time: 08/28/16  4:20 AM Result Value Ref Range  Procalcitonin 138.48 ng/mL   Comment:        Interpretation: PCT >= 10 ng/mL: Important systemic inflammatory response, almost exclusively due to severe bacterial sepsis or septic shock. (NOTE)         ICU PCT Algorithm               Non ICU PCT Algorithm    ----------------------------     ------------------------------         PCT < 0.25 ng/mL                 PCT < 0.1 ng/mL     Stopping of antibiotics            Stopping of antibiotics       strongly encouraged.               strongly encouraged.    ----------------------------     ------------------------------       PCT level decrease by               PCT < 0.25 ng/mL       >= 80% from peak PCT       OR PCT 0.25 - 0.5 ng/mL          Stopping of antibiotics                                             encouraged.     Stopping of antibiotics           encouraged.    ----------------------------     ------------------------------       PCT level decrease by              PCT >= 0.25 ng/mL       < 80% from peak PCT        AND PCT >= 0.5 ng/mL             Continuing antibiotics                                              encouraged.       Continuing antibiotics            encouraged.    ----------------------------     ------------------------------     PCT level  increase compared  PCT > 0.5 ng/mL         with peak PCT AND          PCT >= 0.5 ng/mL             Escalation of antibiotics                                          strongly encouraged.      Escalation of antibiotics        strongly encouraged.  Troponin I     Status: Abnormal  Collection Time: 08/28/16  4:20 AM Result Value Ref Range  Troponin I 0.72 (HH) <0.03 ng/mL   Comment: CRITICAL RESULT CALLED TO, READ BACK BY AND VERIFIED WITH RENEE BABB 08/28/16 0530 [facility]  Fibrin derivatives D-Dimer ([facility] only)     Status: Abnormal  Collection Time: 08/28/16  4:41 AM Result Value Ref Range  Fibrin derivatives D-dimer ([facility]) >7,500.00 (H) 0.00 - 499.00   Comment: (NOTE) <> Exclusion of Venous Thromboembolism (VTE) - OUTPATIENT ONLY   (Emergency Department or [place])   0-499 ng/ml (FEU): With a low to intermediate pretest probability                      for VTE this test result excludes the diagnosis                      of VTE.   >499 ng/ml (FEU) : VTE not excluded; additional work up for VTE is                      required. <> Testing on Inpatients and Evaluation of Disseminated Intravascular   Coagulation (DIC) Reference Range:   0-499 ng/ml (FEU)  Fibrinogen     Status: None  Collection Time: 08/28/16  4:41 AM Result Value Ref Range  Fibrinogen 348 210 - 475 mg/dL Protime-INR     Status: Abnormal  Collection Time: 08/28/16  4:41 AM Result Value Ref Range  Prothrombin Time 15.7 (H) 11.4 - 15.2 seconds  INR 1.24  APTT     Status: None  Collection Time: 08/28/16  4:41 AM Result Value Ref Range  aPTT 33 24 - 36 seconds Glucose, capillary     Status: None  Collection Time: 08/28/16  7:19 AM Result Value Ref Range  Glucose-Capillary 82 65 - 99 mg/dL Glucose, capillary     Status: Abnormal  Collection Time: 08/28/16  7:23 AM Result Value Ref Range  Glucose-Capillary 167 (H) 65 - 99 mg/dL Glucose, capillary     Status: Abnormal  Collection Time: 08/28/16  9:41  AM Result Value Ref Range  Glucose-Capillary 171 (H) 65 - 99 mg/dL Glucose, capillary     Status: Abnormal  Collection Time: 08/28/16  9:44 AM Result Value Ref Range  Glucose-Capillary 185 (H) 65 - 99 mg/dL Magnesium     Status: None  Collection Time: 08/28/16 10:35 AM Result Value Ref Range  Magnesium 1.8 1.7 - 2.4 mg/dL Renal function     Status: Abnormal  Collection Time: 08/28/16 10:35 AM Result Value Ref Range  Sodium 138 135 - 145 mmol/L  Potassium 4.0 3.5 - 5.1 mmol/L  Chloride 105 101 - 111 mmol/L  CO2 24 22 - 32 mmol/L  Glucose, Bld 210 (H) 65 - 99 mg/dL  BUN 50 (H) 6 - 20 mg/dL  Creatinine, Ser 2.10 (H) 0.44 - 1.00 mg/dL  Calcium 7.7 (L) 8.9 - 10.3 mg/dL  Phosphorus 2.6 2.5 - 4.6 mg/dL  Albumin 2.0 (L) 3.5 - 5.0 g/dL  GFR calc non Af Amer 23 (L) >60 mL/min  GFR calc Af Amer 27 (L) >60 mL/min   Comment: (NOTE) The eGFR has been calculated using the CKD EPI equation. This calculation has not been validated in all clinical situations. eGFR's persistently <60 mL/min signify possible Chronic Kidney Disease.   Anion gap 9 5 - 15 Glucose, capillary     Status: Abnormal  Collection Time: 08/28/16 11:39 AM Result Value Ref Range  Glucose-Capillary 219 (H) 65 - 99 mg/dL Glucose, capillary     Status: Abnormal  Collection Time: 08/28/16  4:01 PM Result Value Ref Range  Glucose-Capillary 218 (H) 65 - 99 mg/dL Renal function     Status: Abnormal  Collection Time: 08/28/16  4:27 PM Result Value Ref Range  Sodium 137 135 - 145 mmol/L  Potassium 4.2 3.5 - 5.1 mmol/L  Chloride 103 101 - 111 mmol/L  CO2 27 22 - 32 mmol/L  Glucose, Bld 229 (H) 65 - 99 mg/dL  BUN 49 (H) 6 - 20 mg/dL  Creatinine, Ser 6.06 (H) 0.44 - 1.00 mg/dL  Calcium 8.0 (L) 8.9 - 10.3 mg/dL  Phosphorus 2.7 2.5 - 4.6 mg/dL  Albumin 2.1 (L) 3.5 - 5.0 g/dL  GFR calc non Af Amer 23 (L) >60 mL/min  GFR calc Af Amer 27 (L) >60 mL/min   Comment: (NOTE) The eGFR has been calculated using the CKD EPI  equation. This calculation has not been validated in all clinical situations. eGFR's persistently <60 mL/min signify possible Chronic Kidney Disease.   Anion gap 7 5 - 15 Magnesium     Status: None  Collection Time: 08/28/16  4:27 PM Result Value Ref Range  Magnesium 1.8 1.7 - 2.4 mg/dL Glucose, capillary     Status: Abnormal  Collection Time: 08/28/16  8:16 PM Result Value Ref Range  Glucose-Capillary 190 (H) 65 - 99 mg/dL Renal function     Status: Abnormal  Collection Time: 08/28/16 10:19 PM Result Value Ref Range  Sodium 137 135 - 145 mmol/L  Potassium 4.1 3.5 - 5.1 mmol/L  Chloride 104 101 - 111 mmol/L  CO2 28 22 - 32 mmol/L  Glucose, Bld 187 (H) 65 - 99 mg/dL  BUN 54 (H) 6 - 20 mg/dL  Creatinine, Ser 3.01 (H) 0.44 - 1.00 mg/dL  Calcium 8.1 (L) 8.9 - 10.3 mg/dL  Phosphorus 2.8 2.5 - 4.6 mg/dL  Albumin 2.0 (L) 3.5 - 5.0 g/dL  GFR calc non Af Amer 23 (L) >60 mL/min  GFR calc Af Amer 27 (L) >60 mL/min   Comment: (NOTE) The eGFR has been calculated using the CKD EPI equation. This calculation has not been validated in all clinical situations. eGFR's persistently <60 mL/min signify possible Chronic Kidney Disease.   Anion gap 5 5 - 15 Magnesium     Status: None  Collection Time: 08/28/16 10:19 PM Result Value Ref Range  Magnesium 1.8 1.7 - 2.4 mg/dL Glucose, capillary     Status: Abnormal  Collection Time: 08/28/16 11:43 PM Result Value Ref Range  Glucose-Capillary 186 (H) 65 - 99 mg/dL Magnesium     Status: None  Collection Time: 08/29/16  3:45 AM Result Value Ref Range  Magnesium 1.8 1.7 - 2.4 mg/dL Phosphorus     Status: None  Collection Time: 08/29/16  3:45 AM Result Value Ref  Range  Phosphorus 2.7 2.5 - 4.6 mg/dL CBC with Differential/Platelet     Status: Abnormal  Collection Time: 08/29/16  3:45 AM Result Value Ref Range  WBC 38.8 (H) 3.6 - 11.0 K/uL  RBC 3.10 (L) 3.80 - 5.20 MIL/uL  Hemoglobin 8.8 (L) 12.0 - 16.0 g/dL  HCT 27.2 (L) 35.0 - 47.0  %  MCV 87.5 80.0 - 100.0 fL  MCH 28.2 26.0 - 34.0 pg  MCHC 32.3 32.0 - 36.0 g/dL  RDW 17.3 (H) 11.5 - 14.5 %  Platelets 27 (LL) 150 - 440 K/uL   Comment: PLATELET COUNT CONFIRMED BY SMEAR CRITICAL VALUE NOTED.  VALUE IS CONSISTENT WITH PREVIOUSLY REPORTED AND CALLED VALUE.   Neutrophils Relative % 81 %  Lymphocytes Relative 10 %  Monocytes Relative 9 %  Eosinophils Relative 0 %  Basophils Relative 0 %  nRBC 2 (H) 0 /100 WBC  Neutro Abs 31.4 (H) 1.4 - 6.5 K/uL  Lymphs Abs 3.9 (H) 1.0 - 3.6 K/uL  Monocytes Absolute 3.5 (H) 0.2 - 0.9 K/uL  Eosinophils Absolute 0.0 0 - 0.7 K/uL  Basophils Absolute 0.0 0 - 0.1 K/uL  RBC Morphology MIXED RBC POPULATION    Comment: POLYCHROMASIA PRESENT Procalcitonin     Status: None  Collection Time: 08/29/16  3:45 AM Result Value Ref Range  Procalcitonin 69.77 ng/mL   Comment:        Interpretation: PCT >= 10 ng/mL: Important systemic inflammatory response, almost exclusively due to severe bacterial sepsis or septic shock. (NOTE)         ICU PCT Algorithm               Non ICU PCT Algorithm    ----------------------------     ------------------------------         PCT < 0.25 ng/mL                 PCT < 0.1 ng/mL     Stopping of antibiotics            Stopping of antibiotics       strongly encouraged.               strongly encouraged.    ----------------------------     ------------------------------       PCT level decrease by               PCT < 0.25 ng/mL       >= 80% from peak PCT       OR PCT 0.25 - 0.5 ng/mL          Stopping of antibiotics                                             encouraged.     Stopping of antibiotics           encouraged.    ----------------------------     ------------------------------       PCT level decrease by              PCT >= 0.25 ng/mL       < 80% from peak PCT        AND PCT >= 0.5 ng/mL             Continuing antibiotics  encouraged.       Continuing  antibiotics            encouraged.    ----------------------------     ------------------------------     PCT level increase compared          PCT > 0.5 ng/mL         with peak PCT AND          PCT >= 0.5 ng/mL             Escalation of antibiotics                                          strongly encouraged.      Escalation of antibiotics        strongly encouraged.  Renal function     Status: Abnormal  Collection Time: 08/29/16  3:45 AM Result Value Ref Range  Sodium 138 135 - 145 mmol/L  Potassium 4.0 3.5 - 5.1 mmol/L  Chloride 104 101 - 111 mmol/L  CO2 28 22 - 32 mmol/L  Glucose, Bld 184 (H) 65 - 99 mg/dL  BUN 53 (H) 6 - 20 mg/dL  Creatinine, Ser 2.08 (H) 0.44 - 1.00 mg/dL  Calcium 8.1 (L) 8.9 - 10.3 mg/dL  Phosphorus 2.7 2.5 - 4.6 mg/dL  Albumin 2.0 (L) 3.5 - 5.0 g/dL  GFR calc non Af Amer 23 (L) >60 mL/min  GFR calc Af Amer 27 (L) >60 mL/min   Comment: (NOTE) The eGFR has been calculated using the CKD EPI equation. This calculation has not been validated in all clinical situations. eGFR's persistently <60 mL/min signify possible Chronic Kidney Disease.   Anion gap 6 5 - 15 Fibrin derivatives D-Dimer ([facility] only)     Status: Abnormal  Collection Time: 08/29/16  3:45 AM Result Value Ref Range  Fibrin derivatives D-dimer ([facility]) >7,500.00 (H) 0.00 - 499.00   Comment: (NOTE) <> Exclusion of Venous Thromboembolism (VTE) - OUTPATIENT ONLY   (Emergency Department or [place])   0-499 ng/ml (FEU): With a low to intermediate pretest probability                      for VTE this test result excludes the diagnosis                      of VTE.   >499 ng/ml (FEU) : VTE not excluded; additional work up for VTE is                      required. <> Testing on Inpatients and Evaluation of Disseminated Intravascular   Coagulation (DIC) Reference Range:   0-499 ng/ml (FEU)  Fibrinogen     Status: None  Collection Time: 08/29/16  3:45 AM Result Value Ref Range  Fibrinogen 284 210 -  475 mg/dL Protime-INR     Status: Abnormal  Collection Time: 08/29/16  3:45 AM Result Value Ref Range  Prothrombin Time 15.9 (H) 11.4 - 15.2 seconds  INR 1.26  APTT     Status: None  Collection Time: 08/29/16  3:45 AM Result Value Ref Range  aPTT 33 24 - 36 seconds Glucose, capillary     Status: Abnormal  Collection Time: 08/29/16  3:47 AM Result Value Ref Range  Glucose-Capillary 190 (H) 65 - 99 mg/dL Glucose, capillary     Status: Abnormal  Collection Time: 08/29/16  7:11 AM Result Value Ref Range  Glucose-Capillary 160 (H) 65 - 99 mg/dL Glucose, capillary     Status: Abnormal  Collection Time: 08/29/16  7:14 AM Result Value Ref Range  Glucose-Capillary 156 (H) 65 - 99 mg/dL Blood gas, arterial     Status: Abnormal  Collection Time: 08/29/16  8:40 AM Result Value Ref Range  FIO2 0.35   Delivery systems VENTILATOR   Mode CONTINUOUS POSITIVE AIRWAY PRESSURE   Peep/cpap 5.0 cm H20  Pressure support 5.0 cm H20  pH, Arterial 7.39 7.350 - 7.450  pCO2 arterial 49 (H) 32.0 - 48.0 mmHg  pO2, Arterial 69 (L) 83.0 - 108.0 mmHg  Bicarbonate 29.7 (H) 20.0 - 28.0 mmol/L  Acid-Base Excess 4.0 (H) 0.0 - 2.0 mmol/L  O2 Saturation 93.4 %  Patient temperature 37.0   Collection site LEFT RADIAL   Sample type ARTERIAL DRAW   Allens test (pass/fail) POSITIVE (A) PASS Magnesium     Status: None  Collection Time: 08/29/16 10:13 AM Result Value Ref Range  Magnesium 1.8 1.7 - 2.4 mg/dL Renal function     Status: Abnormal  Collection Time: 08/29/16 10:13 AM Result Value Ref Range  Sodium 139 135 - 145 mmol/L  Potassium 4.0 3.5 - 5.1 mmol/L  Chloride 105 101 - 111 mmol/L  CO2 28 22 - 32 mmol/L  Glucose, Bld 176 (H) 65 - 99 mg/dL  BUN 51 (H) 6 - 20 mg/dL  Creatinine, Ser 1.85 (H) 0.44 - 1.00 mg/dL  Calcium 8.4 (L) 8.9 - 10.3 mg/dL  Phosphorus 2.8 2.5 - 4.6 mg/dL  Albumin 2.3 (L) 3.5 - 5.0 g/dL  GFR calc non Af Amer 27 (L) >60 mL/min  GFR calc Af Amer 31 (L) >60  mL/min   Comment: (NOTE) The eGFR has been calculated using the CKD EPI equation. This calculation has not been validated in all clinical situations. eGFR's persistently <60 mL/min signify possible Chronic Kidney Disease.   Anion gap 6 5 - 15 Glucose, capillary     Status: Abnormal  Collection Time: 08/29/16 11:42 AM Result Value Ref Range  Glucose-Capillary 155 (H) 65 - 99 mg/dL Glucose, capillary     Status: Abnormal  Collection Time: 08/29/16  3:51 PM Result Value Ref Range  Glucose-Capillary 151 (H) 65 - 99 mg/dL Renal function     Status: Abnormal  Collection Time: 08/29/16  4:07 PM Result Value Ref Range  Sodium 138 135 - 145 mmol/L  Potassium 4.2 3.5 - 5.1 mmol/L  Chloride 105 101 - 111 mmol/L  CO2 27 22 - 32 mmol/L  Glucose, Bld 155 (H) 65 - 99 mg/dL  BUN 46 (H) 6 - 20 mg/dL  Creatinine, Ser 1.89 (H) 0.44 - 1.00 mg/dL  Calcium 8.3 (L) 8.9 - 10.3 mg/dL  Phosphorus 2.9 2.5 - 4.6 mg/dL  Albumin 2.2 (L) 3.5 - 5.0 g/dL  GFR calc non Af Amer 26 (L) >60 mL/min  GFR calc Af Amer 30 (L) >60 mL/min   Comment: (NOTE) The eGFR has been calculated using the CKD EPI equation. This calculation has not been validated in all clinical situations. eGFR's persistently <60 mL/min signify possible Chronic Kidney Disease.   Anion gap 6 5 - 15 Magnesium     Status: None  Collection Time: 08/29/16  4:07 PM Result Value Ref Range  Magnesium 1.8 1.7 - 2.4 mg/dL Procalcitonin - Baseline     Status: None  Collection Time: 08/29/16  8:00 PM Result Value Ref Range  Procalcitonin 44.49  ng/mL   Comment:        Interpretation: PCT >= 10 ng/mL: Important systemic inflammatory response, almost exclusively due to severe bacterial sepsis or septic shock. (NOTE)         ICU PCT Algorithm               Non ICU PCT Algorithm    ----------------------------     ------------------------------         PCT < 0.25 ng/mL                 PCT < 0.1 ng/mL     Stopping of antibiotics            Stopping  of antibiotics       strongly encouraged.               strongly encouraged.    ----------------------------     ------------------------------       PCT level decrease by               PCT < 0.25 ng/mL       >= 80% from peak PCT       OR PCT 0.25 - 0.5 ng/mL          Stopping of antibiotics                                             encouraged.     Stopping of antibiotics           encouraged.    ----------------------------     ------------------------------       PCT level decrease by              PCT >= 0.25 ng/mL       < 80% from peak PCT        AND PCT >= 0.5 ng/mL             Continuing antibiotics                                              encouraged.       Continuing antibiotics            encouraged.    ----------------------------     ------------------------------     PCT level increase compared          PCT > 0.5 ng/mL         with peak PCT AND          PCT >= 0.5 ng/mL             Escalation of antibiotics                                          strongly encouraged.      Escalation of antibiotics        strongly encouraged.  CBC     Status: Abnormal  Collection Time: 08/29/16  8:00 PM Result Value Ref Range  WBC 47.8 (H) 3.6 - 11.0 K/uL  RBC 3.40 (L) 3.80 - 5.20 MIL/uL  Hemoglobin 9.3 (L) 12.0 - 16.0 g/dL  HCT 29.2 (L) 35.0 -  47.0 %  MCV 86.0 80.0 - 100.0 fL  MCH 27.4 26.0 - 34.0 pg  MCHC 31.9 (L) 32.0 - 36.0 g/dL  RDW 16.9 (H) 11.5 - 14.5 %  Platelets 33 (L) 150 - 440 K/uL   Comment: PLATELET COUNT CONFIRMED BY SMEAR Glucose, capillary     Status: Abnormal  Collection Time: 08/29/16  8:04 PM Result Value Ref Range  Glucose-Capillary 138 (H) 65 - 99 mg/dL  Comment 1 Notify RN   Comment 2 Document in Chart  Glucose, capillary     Status: Abnormal  Collection Time: 08/29/16  9:51 PM Result Value Ref Range  Glucose-Capillary 111 (H) 65 - 99 mg/dL Renal function     Status: Abnormal  Collection Time: 08/29/16 10:46 PM Result Value Ref  Range  Sodium 139 135 - 145 mmol/L  Potassium 3.9 3.5 - 5.1 mmol/L  Chloride 107 101 - 111 mmol/L  CO2 27 22 - 32 mmol/L  Glucose, Bld 115 (H) 65 - 99 mg/dL  BUN 41 (H) 6 - 20 mg/dL  Creatinine, Ser 1.69 (H) 0.44 - 1.00 mg/dL  Calcium 8.2 (L) 8.9 - 10.3 mg/dL  Phosphorus 2.6 2.5 - 4.6 mg/dL  Albumin 2.1 (L) 3.5 - 5.0 g/dL  GFR calc non Af Amer 30 (L) >60 mL/min  GFR calc Af Amer 35 (L) >60 mL/min   Comment: (NOTE) The eGFR has been calculated using the CKD EPI equation. This calculation has not been validated in all clinical situations. eGFR's persistently <60 mL/min signify possible Chronic Kidney Disease.   Anion gap 5 5 - 15 Magnesium     Status: None  Collection Time: 08/29/16 10:46 PM Result Value Ref Range  Magnesium 1.7 1.7 - 2.4 mg/dL Glucose, capillary     Status: None  Collection Time: 08/29/16 11:49 PM Result Value Ref Range  Glucose-Capillary 99 65 - 99 mg/dL  Comment 1 Notify RN  Magnesium     Status: None  Collection Time: 08/30/16  3:43 AM Result Value Ref Range  Magnesium 1.7 1.7 - 2.4 mg/dL Renal function     Status: Abnormal  Collection Time: 08/30/16  3:43 AM Result Value Ref Range  Sodium 139 135 - 145 mmol/L  Potassium 4.5 3.5 - 5.1 mmol/L  Chloride 106 101 - 111 mmol/L  CO2 27 22 - 32 mmol/L  Glucose, Bld 139 (H) 65 - 99 mg/dL  BUN 39 (H) 6 - 20 mg/dL  Creatinine, Ser 1.54 (H) 0.44 - 1.00 mg/dL  Calcium 8.5 (L) 8.9 - 10.3 mg/dL  Phosphorus 3.0 2.5 - 4.6 mg/dL  Albumin 2.3 (L) 3.5 - 5.0 g/dL  GFR calc non Af Amer 34 (L) >60 mL/min  GFR calc Af Amer 39 (L) >60 mL/min   Comment: (NOTE) The eGFR has been calculated using the CKD EPI equation. This calculation has not been validated in all clinical situations. eGFR's persistently <60 mL/min signify possible Chronic Kidney Disease.   Anion gap 6 5 - 15 Procalcitonin     Status: None  Collection Time: 08/30/16  3:43 AM Result Value Ref Range  Procalcitonin 37.93 ng/mL   Comment:         Interpretation: PCT >= 10 ng/mL: Important systemic inflammatory response, almost exclusively due to severe bacterial sepsis or septic shock. (NOTE)         ICU PCT Algorithm               Non ICU PCT Algorithm    ----------------------------     ------------------------------  PCT < 0.25 ng/mL                 PCT < 0.1 ng/mL     Stopping of antibiotics            Stopping of antibiotics       strongly encouraged.               strongly encouraged.    ----------------------------     ------------------------------       PCT level decrease by               PCT < 0.25 ng/mL       >= 80% from peak PCT       OR PCT 0.25 - 0.5 ng/mL          Stopping of antibiotics                                             encouraged.     Stopping of antibiotics           encouraged.    ----------------------------     ------------------------------       PCT level decrease by              PCT >= 0.25 ng/mL       < 80% from peak PCT        AND PCT >= 0.5 ng/mL             Continuing antibiotics                                              encouraged.       Continuing antibiotics            encouraged.    ----------------------------     ------------------------------     PCT level increase compared          PCT > 0.5 ng/mL         with peak PCT AND          PCT >= 0.5 ng/mL             Escalation of antibiotics                                          strongly encouraged.      Escalation of antibiotics        strongly encouraged.  Prepare Pheresed Platelets     Status: None  Collection Time: 08/30/16  3:43 AM Result Value Ref Range  Unit Number [id number]   Blood Component Type PLTP LR2 PAS   Unit division 00   Status of Unit ISSUED,FINAL   Transfusion Status OK TO TRANSFUSE  Prepare fresh frozen plasma     Status: None  Collection Time: 08/30/16  3:43 AM Result Value Ref Range  Unit Number [id number]   Blood Component Type THAWED PLASMA   Unit division 00   Status of  Unit ISSUED,FINAL   Transfusion Status OK TO TRANSFUSE  BPAM FFP     Status: None  Collection Time: 08/30/16  3:43 AM Result Value Ref Range  ISSUE DATE /  TIME [id number]   Blood Product Unit Number [id number]   PRODUCT CODE [id number]   Unit Type and Rh 5100   Blood Product Expiration Date [id number]  BPAM Platelet Pheresis     Status: None  Collection Time: 08/30/16  3:43 AM Result Value Ref Range  ISSUE DATE / TIME [id number]   Blood Product Unit Number [id number]   PRODUCT CODE [id number]   Unit Type and Rh 5100   Blood Product Expiration Date [id number]  Type and screen [place]     Status: None  Collection Time: 08/30/16  3:45 AM Result Value Ref Range  ABO/RH(D) O POS   Antibody Screen NEG   Sample Expiration 09/02/2016  Glucose, capillary     Status: Abnormal  Collection Time: 08/30/16  4:11 AM Result Value Ref Range  Glucose-Capillary 128 (H) 65 - 99 mg/dL  Comment 1 Notify RN  Glucose, capillary     Status: Abnormal  Collection Time: 08/30/16  7:29 AM Result Value Ref Range  Glucose-Capillary 158 (H) 65 - 99 mg/dL Culture, blood (Routine X 2) w Reflex to ID Panel     Status: None  Collection Time: 08/30/16  9:03 AM Result Value Ref Range  Specimen Description BLOOD LEFT ARM   Special Requests     BOTTLES DRAWN AEROBIC AND ANAEROBIC Blood Culture adequate volume  Culture NO GROWTH 5 DAYS   Report Status 09/04/2016 FINAL  Culture, blood (Routine X 2) w Reflex to ID Panel     Status: None  Collection Time: 08/30/16 10:18 AM Result Value Ref Range  Specimen Description BLOOD LEFT ARM   Special Requests     BOTTLES DRAWN AEROBIC AND ANAEROBIC Blood Culture adequate volume  Culture NO GROWTH 5 DAYS   Report Status 09/04/2016 FINAL  Glucose, capillary     Status: Abnormal  Collection Time: 08/30/16 11:45 AM Result Value Ref Range  Glucose-Capillary 164 (H) 65 - 99 mg/dL Urine Culture     Status: None  Collection Time: 08/30/16  12:01 PM Result Value Ref Range  Specimen Description URINE, RANDOM   Special Requests NONE   Culture     NO GROWTH Performed at [facility], [place] [address] [place], [place] [address]   Report Status 08/31/2016 FINAL  CBC with Differential/Platelet     Status: Abnormal  Collection Time: 08/30/16 12:01 PM Result Value Ref Range  WBC 51.6 (HH) 3.6 - 11.0 K/uL   Comment: CRITICAL RESULT CALLED TO, READ BACK BY AND VERIFIED WITH: JERMAYA KEENY'@1245'$  ON 08/30/16 BY HKP   RBC 3.08 (L) 3.80 - 5.20 MIL/uL  Hemoglobin 8.6 (L) 12.0 - 16.0 g/dL  HCT 26.6 (L) 35.0 - 47.0 %  MCV 86.3 80.0 - 100.0 fL  MCH 27.9 26.0 - 34.0 pg  MCHC 32.3 32.0 - 36.0 g/dL  RDW 17.4 (H) 11.5 - 14.5 %  Platelets 68 (L) 150 - 440 K/uL  Neutrophils Relative % 87 %  Lymphocytes Relative 6 %  Monocytes Relative 7 %  Eosinophils Relative 0 %  Basophils Relative 0 %  Neutro Abs 44.9 (H) 1.4 - 6.5 K/uL  Lymphs Abs 3.1 1.0 - 3.6 K/uL  Monocytes Absolute 3.6 (H) 0.2 - 0.9 K/uL  Eosinophils Absolute 0.0 0 - 0.7 K/uL  Basophils Absolute 0.0 0 - 0.1 K/uL APTT     Status: Abnormal  Collection Time: 08/30/16 12:01 PM Result Value Ref Range  aPTT 56 (H) 24 - 36 seconds   Comment:  IF BASELINE aPTT IS ELEVATED, SUGGEST PATIENT RISK ASSESSMENT BE USED TO DETERMINE APPROPRIATE ANTICOAGULANT THERAPY.  Protime-INR     Status: Abnormal  Collection Time: 08/30/16 12:01 PM Result Value Ref Range  Prothrombin Time 17.7 (H) 11.4 - 15.2 seconds  INR 1.44  Glucose, capillary     Status: Abnormal  Collection Time: 08/30/16  3:36 PM Result Value Ref Range  Glucose-Capillary 130 (H) 65 - 99 mg/dL  Comment 1 Notify RN  Glucose, capillary     Status: Abnormal  Collection Time: 08/30/16  7:54 PM Result Value Ref Range  Glucose-Capillary 124 (H) 65 - 99 mg/dL Glucose, capillary     Status: Abnormal  Collection Time: 08/31/16 12:07 AM Result Value Ref Range  Glucose-Capillary 117 (H) 65 - 99 mg/dL Glucose,  capillary     Status: None  Collection Time: 08/31/16  4:00 AM Result Value Ref Range  Glucose-Capillary 74 65 - 99 mg/dL Procalcitonin     Status: None  Collection Time: 08/31/16  4:21 AM Result Value Ref Range  Procalcitonin 31.79 ng/mL   Comment:        Interpretation: PCT >= 10 ng/mL: Important systemic inflammatory response, almost exclusively due to severe bacterial sepsis or septic shock. (NOTE)         ICU PCT Algorithm               Non ICU PCT Algorithm    ----------------------------     ------------------------------         PCT < 0.25 ng/mL                 PCT < 0.1 ng/mL     Stopping of antibiotics            Stopping of antibiotics       strongly encouraged.               strongly encouraged.    ----------------------------     ------------------------------       PCT level decrease by               PCT < 0.25 ng/mL       >= 80% from peak PCT       OR PCT 0.25 - 0.5 ng/mL          Stopping of antibiotics                                             encouraged.     Stopping of antibiotics           encouraged.    ----------------------------     ------------------------------       PCT level decrease by              PCT >= 0.25 ng/mL       < 80% from peak PCT        AND PCT >= 0.5 ng/mL             Continuing antibiotics                                              encouraged.       Continuing antibiotics  encouraged.    ----------------------------     ------------------------------     PCT level increase compared          PCT > 0.5 ng/mL         with peak PCT AND          PCT >= 0.5 ng/mL             Escalation of antibiotics                                          strongly encouraged.      Escalation of antibiotics        strongly encouraged.  CBC     Status: Abnormal  Collection Time: 08/31/16  4:21 AM Result Value Ref Range  WBC 46.9 (H) 3.6 - 11.0 K/uL  RBC 2.82 (L) 3.80 - 5.20 MIL/uL  Hemoglobin 8.2 (L) 12.0 - 16.0 g/dL  HCT 24.5  (L) 35.0 - 47.0 %  MCV 87.1 80.0 - 100.0 fL  MCH 28.9 26.0 - 34.0 pg  MCHC 33.2 32.0 - 36.0 g/dL  RDW 17.0 (H) 11.5 - 14.5 %  Platelets 79 (L) 150 - 440 K/uL Comprehensive metabolic panel     Status: Abnormal  Collection Time: 08/31/16  4:21 AM Result Value Ref Range  Sodium 139 135 - 145 mmol/L  Potassium 4.3 3.5 - 5.1 mmol/L  Chloride 107 101 - 111 mmol/L  CO2 27 22 - 32 mmol/L  Glucose, Bld 142 (H) 65 - 99 mg/dL  BUN 56 (H) 6 - 20 mg/dL  Creatinine, Ser 2.46 (H) 0.44 - 1.00 mg/dL  Calcium 8.2 (L) 8.9 - 10.3 mg/dL  Total Protein 5.3 (L) 6.5 - 8.1 g/dL  Albumin 2.2 (L) 3.5 - 5.0 g/dL  AST 168 (H) 15 - 41 U/L  ALT 73 (H) 14 - 54 U/L  Alkaline Phosphatase 102 38 - 126 U/L  Total Bilirubin 1.0 0.3 - 1.2 mg/dL  GFR calc non Af Amer 19 (L) >60 mL/min  GFR calc Af Amer 22 (L) >60 mL/min   Comment: (NOTE) The eGFR has been calculated using the CKD EPI equation. This calculation has not been validated in all clinical situations. eGFR's persistently <60 mL/min signify possible Chronic Kidney Disease.   Anion gap 5 5 - 15 Magnesium     Status: None  Collection Time: 08/31/16  4:21 AM Result Value Ref Range  Magnesium 1.9 1.7 - 2.4 mg/dL Phosphorus     Status: None  Collection Time: 08/31/16  4:21 AM Result Value Ref Range  Phosphorus 4.1 2.5 - 4.6 mg/dL Glucose, capillary     Status: Abnormal  Collection Time: 08/31/16  7:07 AM Result Value Ref Range  Glucose-Capillary 136 (H) 65 - 99 mg/dL Protime-INR     Status: Abnormal  Collection Time: 08/31/16  7:53 AM Result Value Ref Range  Prothrombin Time 16.5 (H) 11.4 - 15.2 seconds  INR 1.32  APTT     Status: None  Collection Time: 08/31/16  7:53 AM Result Value Ref Range  aPTT 31 24 - 36 seconds Glucose, capillary     Status: Abnormal  Collection Time: 08/31/16 11:00 AM Result Value Ref Range  Glucose-Capillary 203 (H) 65 - 99 mg/dL Glucose, capillary     Status: Abnormal  Collection Time: 08/31/16  3:55  PM Result Value Ref Range  Glucose-Capillary 108 (H) 65 - 99 mg/dL Glucose, capillary  Status: Abnormal  Collection Time: 08/31/16  8:05 PM Result Value Ref Range  Glucose-Capillary 107 (H) 65 - 99 mg/dL CBC     Status: Abnormal  Collection Time: 08/31/16  9:44 PM Result Value Ref Range  WBC 47.1 (H) 3.6 - 11.0 K/uL  RBC 3.15 (L) 3.80 - 5.20 MIL/uL  Hemoglobin 8.7 (L) 12.0 - 16.0 g/dL  HCT 26.8 (L) 35.0 - 47.0 %  MCV 85.1 80.0 - 100.0 fL  MCH 27.7 26.0 - 34.0 pg  MCHC 32.6 32.0 - 36.0 g/dL  RDW 16.6 (H) 11.5 - 14.5 %  Platelets 93 (L) 150 - 440 K/uL Basic metabolic panel     Status: Abnormal  Collection Time: 08/31/16  9:44 PM Result Value Ref Range  Sodium 140 135 - 145 mmol/L  Potassium 3.5 3.5 - 5.1 mmol/L  Chloride 106 101 - 111 mmol/L  CO2 24 22 - 32 mmol/L  Glucose, Bld 126 (H) 65 - 99 mg/dL  BUN 73 (H) 6 - 20 mg/dL  Creatinine, Ser 3.40 (H) 0.44 - 1.00 mg/dL  Calcium 8.6 (L) 8.9 - 10.3 mg/dL  GFR calc non Af Amer 13 (L) >60 mL/min  GFR calc Af Amer 15 (L) >60 mL/min   Comment: (NOTE) The eGFR has been calculated using the CKD EPI equation. This calculation has not been validated in all clinical situations. eGFR's persistently <60 mL/min signify possible Chronic Kidney Disease.   Anion gap 10 5 - 15 Lactic acid, plasma     Status: None  Collection Time: 08/31/16  9:44 PM Result Value Ref Range  Lactic Acid, Venous 1.2 0.5 - 1.9 mmol/L Protime-INR     Status: Abnormal  Collection Time: 08/31/16  9:44 PM Result Value Ref Range  Prothrombin Time 16.3 (H) 11.4 - 15.2 seconds  INR 1.30  APTT     Status: None  Collection Time: 08/31/16  9:44 PM Result Value Ref Range  aPTT 30 24 - 36 seconds Glucose, capillary     Status: Abnormal  Collection Time: 08/31/16 11:52 PM Result Value Ref Range  Glucose-Capillary 146 (H) 65 - 99 mg/dL Glucose, capillary     Status: Abnormal  Collection Time: 09/01/16  3:46 AM Result Value Ref Range  Glucose-Capillary 129 (H) 65  - 99 mg/dL Glucose, capillary     Status: Abnormal  Collection Time: 09/01/16  4:56 AM Result Value Ref Range  Glucose-Capillary 125 (H) 65 - 99 mg/dL Renal function     Status: Abnormal  Collection Time: 09/01/16  6:06 AM Result Value Ref Range  Sodium 138 135 - 145 mmol/L  Potassium 3.8 3.5 - 5.1 mmol/L  Chloride 105 101 - 111 mmol/L  CO2 24 22 - 32 mmol/L  Glucose, Bld 126 (H) 65 - 99 mg/dL  BUN 76 (H) 6 - 20 mg/dL  Creatinine, Ser 3.56 (H) 0.44 - 1.00 mg/dL  Calcium 8.4 (L) 8.9 - 10.3 mg/dL  Phosphorus 5.4 (H) 2.5 - 4.6 mg/dL  Albumin 2.4 (L) 3.5 - 5.0 g/dL  GFR calc non Af Amer 12 (L) >60 mL/min  GFR calc Af Amer 14 (L) >60 mL/min   Comment: (NOTE) The eGFR has been calculated using the CKD EPI equation. This calculation has not been validated in all clinical situations. eGFR's persistently <60 mL/min signify possible Chronic Kidney Disease.   Anion gap 9 5 - 15  *Note: Due to a large number of results and/or encounters for the requested time period, some results have not been displayed. A complete set of results  can be found in Results Review.   Radiology Abdomen 1 View (kub)  Result Date: 10/09/2016 CLINICAL DATA:  History of left renal stone with recent lithotripsy EXAM: ABDOMEN - 1 VIEW COMPARISON:  10/02/2016 FINDINGS: Scattered large and small bowel gas is noted. Multiple gallstones are seen. Two renal calculi are noted on the left with a nephrostomy catheter in place. The overall appearance is stable from the prior exam. No definitive ureteral stone is seen. IMPRESSION: Stable appearing left renal calculi. Electronically Signed   By: Inez Catalina M.D.   On: 10/09/2016 12:01      Assessment/Plan  1. 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 She has dry gangrenous changes with no immediate need for amputation. She has strongly palpable pedal pulses and radial pulses and I do not think this originated from peripheral arterial disease but more so from the vasopressors from her  sepsis. I would allow the tissue demarcated still at this point. She has saved more tissue than I thought she would, but I suspect we will need to resect the distal tip of her right fourth toe to get this wound to heal. Otherwise, I think she will avoid any surgery. She would like to wait and not have to have any surgery at this point which is reasonable. She will return in 1 month. 2. Diabetes. blood glucose control important in reducing the progression of atherosclerotic disease. Also, involved in wound healing. On appropriate medications. 3. Hypertension. blood pressure control important in reducing the progression of atherosclerotic disease. On appropriate oral medications. 4. Renal failure. Acute renal failure has improved but not entirely resolved.  Leotis Pain, MD  11/07/2016 10:15 AM    This note was created with Dragon medical transcription system.  Any errors from dictation are purely unintentional

## 2016-11-10 ENCOUNTER — Telehealth (INDEPENDENT_AMBULATORY_CARE_PROVIDER_SITE_OTHER): Payer: Self-pay

## 2016-11-10 NOTE — Telephone Encounter (Signed)
Patient called wanting to have surgery. She was seen on 11/07/16 and the note states to return in 1 month.

## 2016-11-11 NOTE — Telephone Encounter (Signed)
Patient scheduled for surgery.

## 2016-11-12 ENCOUNTER — Other Ambulatory Visit (INDEPENDENT_AMBULATORY_CARE_PROVIDER_SITE_OTHER): Payer: Self-pay | Admitting: Vascular Surgery

## 2016-11-13 ENCOUNTER — Encounter
Admission: RE | Disposition: A | Discharge status: Home / Self Care | Payer: Self-pay | Source: Ambulatory Visit | Attending: Vascular Surgery

## 2016-11-13 ENCOUNTER — Ambulatory Visit: Payer: Medicare PPO | Admitting: Anesthesiology

## 2016-11-13 ENCOUNTER — Ambulatory Visit
Admission: RE | Admit: 2016-11-13 | Discharge: 2016-11-13 | Disposition: A | Discharge status: Home / Self Care | Payer: Medicare PPO | Source: Ambulatory Visit | Attending: Vascular Surgery | Admitting: Vascular Surgery

## 2016-11-13 DIAGNOSIS — I10 Essential (primary) hypertension: Secondary | ICD-10-CM | POA: Insufficient documentation

## 2016-11-13 DIAGNOSIS — G4733 Obstructive sleep apnea (adult) (pediatric): Secondary | ICD-10-CM | POA: Insufficient documentation

## 2016-11-13 DIAGNOSIS — K219 Gastro-esophageal reflux disease without esophagitis: Secondary | ICD-10-CM | POA: Diagnosis not present

## 2016-11-13 DIAGNOSIS — I96 Gangrene, not elsewhere classified: Secondary | ICD-10-CM | POA: Insufficient documentation

## 2016-11-13 DIAGNOSIS — Z79899 Other long term (current) drug therapy: Secondary | ICD-10-CM | POA: Diagnosis not present

## 2016-11-13 DIAGNOSIS — L97519 Non-pressure chronic ulcer of other part of right foot with unspecified severity: Secondary | ICD-10-CM | POA: Diagnosis not present

## 2016-11-13 DIAGNOSIS — E119 Type 2 diabetes mellitus without complications: Secondary | ICD-10-CM | POA: Diagnosis not present

## 2016-11-13 DIAGNOSIS — Z8673 Personal history of transient ischemic attack (TIA), and cerebral infarction without residual deficits: Secondary | ICD-10-CM | POA: Insufficient documentation

## 2016-11-13 DIAGNOSIS — S91201A Unspecified open wound of right great toe with damage to nail, initial encounter: Secondary | ICD-10-CM

## 2016-11-13 HISTORY — PX: IRRIGATION AND DEBRIDEMENT FOOT: SHX6602

## 2016-11-13 LAB — BASIC METABOLIC PANEL
Anion gap: 9 (ref 5–15)
BUN: 18 mg/dL (ref 6–20)
CO2: 25 mmol/L (ref 22–32)
CREATININE: 0.86 mg/dL (ref 0.44–1.00)
Calcium: 10 mg/dL (ref 8.9–10.3)
Chloride: 107 mmol/L (ref 101–111)
GFR calc Af Amer: >60 mL/min (ref >60)
Glucose, Bld: 88 mg/dL (ref 65–99)
Potassium: 3.1 mmol/L — ABNORMAL LOW (ref 3.5–5.1)
SODIUM: 141 mmol/L (ref 135–145)

## 2016-11-13 LAB — CBC WITH DIFFERENTIAL/PLATELET
BASOS ABS: 0 10*3/uL (ref 0–0.1)
BASOS PCT: 1 %
Eosinophils Absolute: 0.1 10*3/uL (ref 0–0.7)
Eosinophils Relative: 1 %
HEMATOCRIT: 35.4 % (ref 35.0–47.0)
HEMOGLOBIN: 11.8 g/dL — AB (ref 12.0–16.0)
Lymphocytes Relative: 42 %
Lymphs Abs: 3.1 10*3/uL (ref 1.0–3.6)
MCH: 28.5 pg (ref 26.0–34.0)
MCHC: 33.3 g/dL (ref 32.0–36.0)
MCV: 85.7 fL (ref 80.0–100.0)
MONO ABS: 0.5 10*3/uL (ref 0.2–0.9)
MONOS PCT: 7 %
NEUTROS ABS: 3.6 10*3/uL (ref 1.4–6.5)
Neutrophils Relative %: 49 %
PLATELETS: 179 10*3/uL (ref 150–440)
RBC: 4.13 MIL/uL (ref 3.80–5.20)
RDW: 15 % — ABNORMAL HIGH (ref 11.5–14.5)
WBC: 7.3 10*3/uL (ref 3.6–11.0)

## 2016-11-13 LAB — GLUCOSE, CAPILLARY
GLUCOSE-CAPILLARY: 89 mg/dL (ref 65–99)
GLUCOSE-CAPILLARY: 85 mg/dL (ref 65–99)

## 2016-11-13 LAB — PROTIME-INR
INR: 1.16
Prothrombin Time: 14.7 seconds (ref 11.4–15.2)

## 2016-11-13 LAB — APTT: APTT: 36 s (ref 24–36)

## 2016-11-13 LAB — TYPE AND SCREEN
ABO/RH(D): O POS
ANTIBODY SCREEN: NEGATIVE

## 2016-11-13 SURGERY — IRRIGATION AND DEBRIDEMENT FOOT
Anesthesia: General | Laterality: Right

## 2016-11-13 MED ORDER — PROPOFOL 10 MG/ML IV BOLUS
INTRAVENOUS | Status: AC
  Filled 2016-11-13: qty 20

## 2016-11-13 MED ORDER — CEFAZOLIN SODIUM-DEXTROSE 2-4 GM/100ML-% IV SOLN
2.0000 g | INTRAVENOUS | Status: AC
  Administered 2016-11-13: 2 g via INTRAVENOUS

## 2016-11-13 MED ORDER — PROPOFOL 500 MG/50ML IV EMUL
INTRAVENOUS | Status: DC | PRN
  Administered 2016-11-13: 150 ug/kg/min via INTRAVENOUS

## 2016-11-13 MED ORDER — OXYCODONE HCL 5 MG PO TABS: 5.0000 mg | ORAL_TABLET | Freq: Once | ORAL | Status: DC | PRN

## 2016-11-13 MED ORDER — CEFAZOLIN SODIUM-DEXTROSE 2-4 GM/100ML-% IV SOLN
INTRAVENOUS | Status: AC
  Filled 2016-11-13: qty 100

## 2016-11-13 MED ORDER — CHLORHEXIDINE GLUCONATE CLOTH 2 % EX PADS: 6.0000 | MEDICATED_PAD | Freq: Once | CUTANEOUS | Status: DC

## 2016-11-13 MED ORDER — BUPIVACAINE HCL (PF) 0.5 % IJ SOLN
INTRAMUSCULAR | Status: DC | PRN
Start: 2016-11-13 — End: 2016-11-13
  Administered 2016-11-13: 20 mL

## 2016-11-13 MED ORDER — FENTANYL CITRATE (PF) 100 MCG/2ML IJ SOLN: 25.0000 ug | INTRAMUSCULAR | Status: DC | PRN

## 2016-11-13 MED ORDER — SODIUM CHLORIDE 0.9 % IV SOLN
INTRAVENOUS | Status: DC | PRN
  Administered 2016-11-13: 16:00:00 via INTRAVENOUS

## 2016-11-13 MED ORDER — HYDROCODONE-ACETAMINOPHEN 5-325 MG PO TABS: 1.0000 | ORAL_TABLET | ORAL | 0 refills | Status: DC | PRN

## 2016-11-13 MED ORDER — PROPOFOL 10 MG/ML IV BOLUS
INTRAVENOUS | Status: DC | PRN
  Administered 2016-11-13: 50 mg via INTRAVENOUS

## 2016-11-13 MED ORDER — OXYCODONE HCL 5 MG/5ML PO SOLN: 5.0000 mg | Freq: Once | ORAL | Status: DC | PRN

## 2016-11-13 SURGICAL SUPPLY — 26 items
BANDAGE ELASTIC 6 LF NS (GAUZE/BANDAGES/DRESSINGS) ×3 IMPLANT
BNDG GAUZE 4.5X4.1 6PLY STRL (MISCELLANEOUS) ×3 IMPLANT
BRUSH SCRUB EZ  4% CHG (MISCELLANEOUS) ×2
BRUSH SCRUB EZ 4% CHG (MISCELLANEOUS) ×1 IMPLANT
CANISTER SUCT 1200ML W/VALVE (MISCELLANEOUS) ×3 IMPLANT
CHLORAPREP W/TINT 26ML (MISCELLANEOUS) ×3 IMPLANT
ELECT CAUTERY BLADE 6.4 (BLADE) ×3 IMPLANT
ELECT REM PT RETURN 9FT ADLT (ELECTROSURGICAL) ×3
ELECTRODE REM PT RTRN 9FT ADLT (ELECTROSURGICAL) ×1 IMPLANT
GAUZE PETRO XEROFOAM 1X8 (MISCELLANEOUS) ×3 IMPLANT
GAUZE SPONGE 4X4 12PLY STRL (GAUZE/BANDAGES/DRESSINGS) IMPLANT
GLOVE BIO SURGEON STRL SZ7 (GLOVE) ×3 IMPLANT
GOWN STRL REUS W/ TWL LRG LVL3 (GOWN DISPOSABLE) ×1 IMPLANT
GOWN STRL REUS W/ TWL XL LVL3 (GOWN DISPOSABLE) ×1 IMPLANT
GOWN STRL REUS W/TWL LRG LVL3 (GOWN DISPOSABLE) ×2
GOWN STRL REUS W/TWL XL LVL3 (GOWN DISPOSABLE) ×2
HANDLE YANKAUER SUCT BULB TIP (MISCELLANEOUS) ×3 IMPLANT
KIT RM TURNOVER STRD PROC AR (KITS) ×3 IMPLANT
LABEL OR SOLS (LABEL) IMPLANT
NS IRRIG 1000ML POUR BTL (IV SOLUTION) ×3 IMPLANT
PACK EXTREMITY ARMC (MISCELLANEOUS) ×3 IMPLANT
PAD PREP 24X41 OB/GYN DISP (PERSONAL CARE ITEMS) ×3 IMPLANT
SOL PREP PVP 2OZ (MISCELLANEOUS) ×3
SOLUTION PREP PVP 2OZ (MISCELLANEOUS) ×1 IMPLANT
SPONGE LAP 18X18 5 PK (GAUZE/BANDAGES/DRESSINGS) ×3 IMPLANT
STOCKINETTE BIAS CUT 6 980064 (GAUZE/BANDAGES/DRESSINGS) ×3 IMPLANT

## 2016-11-13 NOTE — Telephone Encounter (Signed)
Patient scheduled for surgery today

## 2016-11-13 NOTE — Anesthesia Preprocedure Evaluation (Signed)
Anesthesia Evaluation  Patient identified by MRN, date of birth, ID band Patient awake    Reviewed: Allergy & Precautions, H&P , NPO status , Patient's Chart, lab work & pertinent test results  History of Anesthesia Complications Negative for: history of anesthetic complications  Airway Mallampati: III  TM Distance: >3 FB Neck ROM: limited    Dental  (+) Poor Dentition, Chipped   Pulmonary shortness of breath, sleep apnea and Continuous Positive Airway Pressure Ventilation ,           Cardiovascular Exercise Tolerance: Good hypertension, (-) angina+ Peripheral Vascular Disease  (-) Past MI and (-) DOE      Neuro/Psych CVA, Residual Symptoms negative psych ROS   GI/Hepatic Neg liver ROS, GERD  Medicated and Controlled,  Endo/Other  negative endocrine ROSdiabetes, Type 2  Renal/GU Renal disease  negative genitourinary   Musculoskeletal   Abdominal   Peds  Hematology negative hematology ROS (+)   Anesthesia Other Findings Past Medical History: 2018: Acute renal failure (HCC) 09/17/2016: Benign essential hypertension 11/21/2013: Diabetes mellitus type 2, controlled, with complications  (HCC)     Comment:  Overview:  microalbuminuria No date: Diabetes mellitus without complication (HCC) No date: Dyspnea No date: GERD (gastroesophageal reflux disease) 06/16/2016: History of CVA (cerebrovascular accident)     Comment:  Overview:  Thalamic CVA, 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 No date: Hypertension 06/16/2016: Medicare annual wellness visit, initial     Comment:  Overview:  4/18 09/17/2016: Microscopic hematuria 09/2016: Nephrolithiasis 11/21/2013: OSA (obstructive sleep apnea) No date: PVD (peripheral vascular disease) (HCC)     Comment:  blisters on Lt foot sees Dr Wyn Quaker No date: Septic shock due to urinary tract infection (HCC) 04/2014: Stroke [facility])  Past Surgical History: 1971: BREAST EXCISIONAL BIOPSY; Bilateral     Comment:  neg No date:  COMBINED LAPAROSCOPY W/ HYSTEROSCOPY 10/20/2016: CYSTOSCOPY/URETEROSCOPY/HOLMIUM LASER/STENT PLACEMENT; Left     Comment:  Procedure: CYSTOSCOPY/URETEROSCOPY/HOLMIUM LASER/STENT               PLACEMENT;  Surgeon: Vanna Scotland, MD;  Location: [facility]              ORS;  Service: Urology;  Laterality: Left; 2013: EXTRACORPOREAL SHOCK WAVE LITHOTRIPSY 10/02/2016: EXTRACORPOREAL SHOCK WAVE LITHOTRIPSY; Left     Comment:  Procedure: EXTRACORPOREAL SHOCK WAVE LITHOTRIPSY (ESWL);              Surgeon: Vanna Scotland, MD;  Location: [facility] ORS;                Service: Urology;  Laterality: Left; 08/24/2016: IR NEPHROSTOMY PLACEMENT LEFT No date: VAGINAL HYSTERECTOMY  BMI    Body Mass Index:  28.79 kg/m      Reproductive/Obstetrics negative OB ROS                             Anesthesia Physical Anesthesia Plan  ASA: III  Anesthesia Plan: General   Post-op Pain Management:    Induction: Intravenous  PONV Risk Score and Plan:   Airway Management Planned: Natural Airway and Nasal Cannula  Additional Equipment:   Intra-op Plan:   Post-operative Plan:   Informed Consent: I have reviewed the patients History and Physical, chart, labs and discussed the procedure including the risks, benefits and alternatives for the proposed anesthesia with the patient or authorized representative who has indicated his/her understanding and acceptance.   Dental Advisory Given  Plan Discussed with: Anesthesiologist, CRNA and Surgeon  Anesthesia Plan Comments: (Patient consented  for risks of anesthesia including but not limited to:  - adverse reactions to medications - risk of intubation if required - damage to teeth, lips or other oral mucosa - sore throat or hoarseness - Damage to heart, brain, lungs or loss of life  Patient voiced understanding.)        Anesthesia Quick Evaluation

## 2016-11-13 NOTE — Op Note (Signed)
  OPERATIVE NOTE   PROCEDURE: Right fourth toe debridement of skin, soft tissue, and muscle  PRE-OPERATIVE DIAGNOSIS: Right fourth toe wound after dry gangrene   POST-OPERATIVE DIAGNOSIS: same as above  SURGEON: Leotis Pain, MD  ASSISTANT(S): None  ANESTHESIA: Mac with a digital block with 20 mL of half percent Marcaine  ESTIMATED BLOOD LOSS: 10 cc  FINDING(S): none  SPECIMEN(S): Distal aspect of right fourth toe  INDICATIONS:  Patient is a 66 y.o.female who presents with right fourth toe ulceration after dry gangrenous changes have demarcated. The patient is scheduled for digital distal toe amputation. I discussed in depth with the patient the risks, benefits, and alternatives to this procedure. The patient is aware that the risk of this operation included but are not limited to: bleeding, infection, myocardial infarction, stroke, death, failure to heal amputation wound, and possible need for more proximal amputation. The patient is aware of the risks and agrees proceed forward with the procedure.  DESCRIPTION: After full informed written consent was obtained from the patient, the patient was brought back to the operating room, and placed supine upon the operating table. Prior to induction, the patient received IV antibiotics. The patient was then prepped and draped in the standard fashion. Curvilinear incision was made at the base of the wound on the right fourth toe. This was below the toenail and on the posterior aspect of the toe. This was about a 3-4 cm in circumference circular incision to excise the wound. I had expected to find some exposed bone underneath the wound, but there is no obvious exposed bone. There were some tendons and soft tissue that were debrided back to clearly healthy and viable tissue. Electrocautery was used for hemostasis and hemostasis was complete. The wound was then closed with several mattress 3-0 nylons. Sterile dressing was  placed.  COMPLICATIONS: none  CONDITION: stable   Leotis Pain, MD 11/13/2016 5:06 PM

## 2016-11-13 NOTE — Anesthesia Procedure Notes (Signed)
Date/Time: 11/13/2016 4:49 PM Performed by: Junious Silk Pre-anesthesia Checklist: Patient identified, Emergency Drugs available, Suction available, Patient being monitored and Timeout performed Oxygen Delivery Method: Simple face mask

## 2016-11-13 NOTE — Anesthesia Post-op Follow-up Note (Signed)
Anesthesia QCDR form completed.        

## 2016-11-13 NOTE — Anesthesia Postprocedure Evaluation (Signed)
Anesthesia Post Note  Patient: Megan Nixon  Procedure(s) Performed: Procedure(s) (LRB): IRRIGATION AND DEBRIDEMENT RIGHT FOURTH TOE (Right)  Patient location during evaluation: PACU Anesthesia Type: General Level of consciousness: awake and alert Pain management: pain level controlled Vital Signs Assessment: post-procedure vital signs reviewed and stable Respiratory status: spontaneous breathing, nonlabored ventilation, respiratory function stable and patient connected to nasal cannula oxygen Cardiovascular status: blood pressure returned to baseline and stable Postop Assessment: no apparent nausea or vomiting Anesthetic complications: no     Last Vitals:  Vitals:   11/13/16 1746 11/13/16 1822  BP: (!) 146/87 (!) 152/73  Pulse: 75 68  Resp: 15 15  Temp: (!) 35.9 C   SpO2: 100% 100%    Last Pain:  Vitals:   11/13/16 1746  TempSrc: Temporal                 Cleda Mccreedy Nydia Ytuarte

## 2016-11-13 NOTE — Discharge Instructions (Signed)
AMBULATORY SURGERY  DISCHARGE INSTRUCTIONS   1) The drugs that you were given will stay in your system until tomorrow so for the next 24 hours you should not:  A) Drive an automobile B) Make any legal decisions C) Drink any alcoholic beverage   2) You may resume regular meals tomorrow.  Today it is better to start with liquids and gradually work up to solid foods.  You may eat anything you prefer, but it is better to start with liquids, then soup and crackers, and gradually work up to solid foods.   3) Please notify your doctor immediately if you have any unusual bleeding, trouble breathing, redness and pain at the surgery site, drainage, fever, or pain not relieved by medication.    4) Additional Instructions:Keep right foot elevated as much as possible.  Do not weight bear on right foot.  If any bleeding, apply pressure and call MD office.  Take pain medication as needed but take stool softener along with it.  Also, have something in your stomach prior to taking Norco.  If you do not want to take a narcotic, then yu may take tylenol extra strength instead.   Please contact your physician with any problems or Same Day Surgery at [phone], Monday through Friday 6 am to 4 pm, or [place] at [facility] number at [phone].

## 2016-11-13 NOTE — H&P (Signed)
[facility] History & Physical Update  The patient was interviewed and re-examined.  The patient's previous History and Physical has been reviewed and is unchanged.  The patient is now having enough discomfort with the ulceration on the right fourth toe, that she would like to have that debrided, which will require amputation of the tip of the right fourth toe. We plan to proceed with the scheduled procedure.  Festus Barren, MD  11/13/2016, 4:07 PM

## 2016-11-13 NOTE — Transfer of Care (Signed)
Immediate Anesthesia Transfer of Care Note  Patient: Megan Nixon  Procedure(s) Performed: Procedure(s): IRRIGATION AND DEBRIDEMENT RIGHT FOURTH TOE (Right)  Patient Location: PACU  Anesthesia Type:General  Level of Consciousness: sedated  Airway & Oxygen Therapy: Patient Spontanous Breathing and Patient connected to nasal cannula oxygen  Post-op Assessment: Report given to RN and Post -op Vital signs reviewed and stable  Post vital signs: Reviewed and stable  Last Vitals:  Vitals:   11/13/16 1746 11/13/16 1822  BP: (!) 146/87 (!) 152/73  Pulse: 75 68  Resp: 15 15  Temp: (!) 35.9 C   SpO2: 100% 100%    Last Pain:  Vitals:   11/13/16 1746  TempSrc: Temporal      Patients Stated Pain Goal: 0 (11/13/16 1746)  Complications: No apparent anesthesia complications

## 2016-11-14 ENCOUNTER — Encounter: Payer: Self-pay | Admitting: Vascular Surgery

## 2016-11-17 LAB — SURGICAL PATHOLOGY

## 2016-11-20 ENCOUNTER — Ambulatory Visit: Payer: Medicare PPO

## 2016-11-24 ENCOUNTER — Ambulatory Visit
Admission: RE | Admit: 2016-11-24 | Discharge: 2016-11-24 | Disposition: A | Discharge status: Home / Self Care | Payer: Medicare PPO | Source: Ambulatory Visit | Attending: Urology | Admitting: Urology

## 2016-11-24 DIAGNOSIS — N281 Cyst of kidney, acquired: Secondary | ICD-10-CM | POA: Insufficient documentation

## 2016-11-24 DIAGNOSIS — N200 Calculus of kidney: Secondary | ICD-10-CM | POA: Diagnosis present

## 2016-11-24 DIAGNOSIS — R161 Splenomegaly, not elsewhere classified: Secondary | ICD-10-CM | POA: Diagnosis not present

## 2016-11-26 ENCOUNTER — Telehealth (INDEPENDENT_AMBULATORY_CARE_PROVIDER_SITE_OTHER): Payer: Self-pay | Admitting: Vascular Surgery

## 2016-11-26 NOTE — Telephone Encounter (Signed)
We will remove her sutures during her post-op visit on 12/19/16.

## 2016-11-26 NOTE — Telephone Encounter (Signed)
Megan Nixon CALLED. SHE HAD SURGERY ON THE TIP OF HER TOE ON 11/13/16. SHE IS WANTING TO KNOW DOES SHE WAIT UNTIL 12/19/16 TO GET THE STITCHES OUT THEN, OR MAKE A APPOINTMENT TO COME IN SOONER FOR STITCHES TO COME OUT. THE PROCEDURE SHE HAD DONE IS , IRRIGATION AND DEBRIDEMENT RIGHT FOURTH TOE IRRIGATION AND DEBRIDEMENT RIGHT FOURTH TOE.

## 2016-11-26 NOTE — Telephone Encounter (Signed)
Called the patient to let her know too keep her appointment with [place] on the 26, and that the sutures will be taken out at that time.

## 2016-11-27 ENCOUNTER — Encounter: Payer: Self-pay | Admitting: Urology

## 2016-11-27 ENCOUNTER — Ambulatory Visit (INDEPENDENT_AMBULATORY_CARE_PROVIDER_SITE_OTHER): Payer: Medicare PPO | Admitting: Urology

## 2016-11-27 VITALS — BP 108/71 | HR 105 | Ht 65.0 in | Wt 170.0 lb

## 2016-11-27 DIAGNOSIS — N200 Calculus of kidney: Secondary | ICD-10-CM | POA: Diagnosis not present

## 2016-11-27 NOTE — Progress Notes (Signed)
11/27/2016 10:53 AM   Megan Nixon 11-27-1950 [id number]  Referring provider: Danella Penton, MD [phone] [facility] [address] [facility] [place], [place] [address]  Chief Complaint  Patient presents with  . RUS results    1 month    HPI: 66 year old female with history of profound urosepsis requiring ICU admission, pressors secondary to 1 cm left renal pelvic stone.  In addition to this, she had a 1 cm left lower pole stone.  She underwent failed ESWL for treatment of this after infection was treated with a percutaneous nephrostomy tube.  She returned to the operating room on 10/20/2016 for left ureteroscopy, laser lithotripsy, and nephrostomy tube removal.  Her stent was subsequently removed.  Today, she reports that she is doing remarkably well.  She denies any flank pain or urinary symptoms.  She is now doing all of her own ADLs and overall improved.    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  She does have a personal history of kidney stones.  She underwent shockwave lithotripsy 2013.  Stone analysis consistent with 95% calcium oxalate monohydrate, 5% calcium phosphate.  She is trying her best to drink plenty of fluids.   PMH: Past Medical History:  Diagnosis Date  . Acute renal failure (HCC) 2018  . Benign essential hypertension 09/17/2016  . Diabetes mellitus type 2, controlled, with complications (HCC) 11/21/2013   Overview:  microalbuminuria  . Diabetes mellitus without complication (HCC)   . Dyspnea   . GERD (gastroesophageal reflux disease)   . 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 Hypertension   . Medicare annual wellness visit, initial 06/16/2016   Overview:  4/18  . Microscopic hematuria 09/17/2016  . Nephrolithiasis 09/2016  . OSA (obstructive sleep apnea) 11/21/2013  . PVD (peripheral vascular disease) (HCC)    blisters on  Lt foot sees Dr Wyn Quaker  . Septic shock due to urinary tract infection (HCC)   . Stroke [facility]) 04/2014    Surgical History: Past Surgical History:  Procedure Laterality Date  . BREAST EXCISIONAL BIOPSY Bilateral 1971   neg  . COMBINED LAPAROSCOPY W/ HYSTEROSCOPY    . CYSTOSCOPY/URETEROSCOPY/HOLMIUM LASER/STENT PLACEMENT Left 10/20/2016   Procedure: CYSTOSCOPY/URETEROSCOPY/HOLMIUM LASER/STENT PLACEMENT;  Surgeon: Vanna Scotland, MD;  Location: [facility] ORS;  Service: Urology;  Laterality: Left;  . EXTRACORPOREAL SHOCK WAVE LITHOTRIPSY  2013  . EXTRACORPOREAL SHOCK WAVE LITHOTRIPSY Left 10/02/2016   Procedure: EXTRACORPOREAL SHOCK WAVE LITHOTRIPSY (ESWL);  Surgeon: Vanna Scotland, MD;  Location: [facility] ORS;  Service: Urology;  Laterality: Left;  . IR NEPHROSTOMY PLACEMENT LEFT  08/24/2016  . IRRIGATION AND DEBRIDEMENT FOOT Right 11/13/2016   Procedure: IRRIGATION AND DEBRIDEMENT RIGHT FOURTH TOE;  Surgeon: Annice Needy, MD;  Location: [facility] ORS;  Service: Vascular;  Laterality: Right;  [name] VAGINAL HYSTERECTOMY      Home Medications:  Allergies as of 11/27/2016   No Known Allergies     Medication List       Accurate as of 11/27/16 10:53 AM. Always use your most recent med list.          glimepiride 1 MG tablet Commonly known as:  AMARYL Take 1 mg by mouth daily with breakfast.   pantoprazole 40 MG tablet Commonly known as:  PROTONIX Take 1 tablet (40 mg total) by mouth daily.   verapamil 180 MG 24 hr capsule Commonly known as:  VERELAN PM Take 180  mg by mouth daily.   vitamin B-12 1000 MCG tablet Commonly known as:  CYANOCOBALAMIN Take 1,000 mcg by mouth daily.       Allergies: No Known Allergies  Family History: Family History  Problem Relation Age of Onset  . Breast cancer Neg Hx   . Bladder Cancer Neg Hx   . Prostate cancer Neg Hx     Social History:  reports that she has never smoked. She has never used smokeless tobacco. She reports that she does not drink alcohol or use  drugs.  ROS: UROLOGY Frequent Urination?: No Hard to postpone urination?: No Burning/pain with urination?: No Get up at night to urinate?: No Leakage of urine?: No Urine stream starts and stops?: No Trouble starting stream?: No Do you have to strain to urinate?: No Blood in urine?: No Urinary tract infection?: No Sexually transmitted disease?: No Injury to kidneys or bladder?: No Painful intercourse?: No Weak stream?: No Currently pregnant?: No Vaginal bleeding?: No Last menstrual period?: n  Gastrointestinal Nausea?: No Vomiting?: No Indigestion/heartburn?: No Diarrhea?: No Constipation?: No  Constitutional Fever: No Night sweats?: No Weight loss?: No Fatigue?: No  Skin Skin rash/lesions?: No Itching?: No  Eyes Blurred vision?: No Double vision?: No  Ears/Nose/Throat Sore throat?: No Sinus problems?: No  Hematologic/Lymphatic Swollen glands?: No Easy bruising?: No  Cardiovascular Leg swelling?: No Chest pain?: No  Respiratory Cough?: No Shortness of breath?: No  Endocrine Excessive thirst?: No  Musculoskeletal Back pain?: No Joint pain?: No  Neurological Headaches?: No Dizziness?: No  Psychologic Depression?: No Anxiety?: No  Physical Exam: BP 108/71   Pulse (!) 105   Ht  (1.651 m)   Wt 170 lb (77.1 kg)   BMI 28.29 kg/m   Constitutional:  Alert and oriented, No acute distress. HEENT: [employer] AT, moist mucus membranes.  Trachea midline, no masses. Cardiovascular: No clubbing, cyanosis, or edema. Respiratory: Normal respiratory effort, no increased work of breathing. GI: Abdomen is soft, nontender, nondistended, no abdominal masses GU: No CVA tenderness.  Skin: No rashes, bruises or suspicious lesions. Neurologic: Grossly intact, no focal deficits, moving all 4 extremities. Psychiatric: Normal mood and affect.  Laboratory Data: Lab Results  Component Value Date   WBC 7.3 11/13/2016   HGB 11.8 (L) 11/13/2016   HCT 35.4  11/13/2016   MCV 85.7 11/13/2016   PLT 179 11/13/2016    Lab Results  Component Value Date   CREATININE 0.86 11/13/2016   Urinalysis N/a  Pertinent Imaging: Results for orders placed during the hospital encounter of 11/24/16  US RENAL   Narrative CLINICAL DATA:  Kidney stones  EXAM: RENAL / URINARY TRACT ULTRASOUND COMPLETE  COMPARISON:  Radiograph 10/09/2016, CT 08/30/2016  FINDINGS: Right Kidney:  Length: 11.9 cm. Echogenicity within normal limits. No mass or hydronephrosis visualized.  Left Kidney:  Length: 11.6 cm. Echogenicity within normal limits. Negative for hydronephrosis. Cyst in the upper pole measuring 2.2 x 2 x 1.8 cm  Bladder:  Appears normal for degree of bladder distention. Incidental note made of slightly enlarged spleen.  IMPRESSION: 1. Negative for hydronephrosis. Previously noted left renal stones are not seen sonographically 2. Cyst in the upper pole of the left kidney 3. Enlarged spleen   Electronically Signed   By: Jasmine Pang M.D.   On: 11/24/2016 23:47    Renal ultrasound personally reviewed today.  Assessment & Plan:    1. Kidney stones Stone analysis reviewed with patient Offered 24-hour urine metabolic workup, declined at this time Renal ultrasound  without hydronephrosis or residual stones We discussed general stone prevention techniques including drinking plenty water with goal of producing 2.5 L urine daily, increased citric acid intake, avoidance of high oxalate containing foods, and decreased salt intake.  Information about dietary recommendations given today.  - Abdomen 1 view (KUB); Future   Return in about 1 year (around 11/27/2017) for KUB.  Vanna Scotland, MD  [facility] [address] [place], [place] [address] [phone]

## 2016-12-19 ENCOUNTER — Ambulatory Visit (INDEPENDENT_AMBULATORY_CARE_PROVIDER_SITE_OTHER): Payer: Medicare PPO | Admitting: Vascular Surgery

## 2016-12-19 ENCOUNTER — Encounter (INDEPENDENT_AMBULATORY_CARE_PROVIDER_SITE_OTHER): Payer: Self-pay | Admitting: Vascular Surgery

## 2016-12-19 VITALS — BP 144/86 | HR 93 | Resp 15 | Ht 65.0 in | Wt 173.0 lb

## 2016-12-19 DIAGNOSIS — I96 Gangrene, not elsewhere classified: Secondary | ICD-10-CM | POA: Diagnosis not present

## 2016-12-19 DIAGNOSIS — E118 Type 2 diabetes mellitus with unspecified complications: Secondary | ICD-10-CM | POA: Diagnosis not present

## 2016-12-19 NOTE — Assessment & Plan Note (Signed)
blood glucose control important in reducing the progression of atherosclerotic disease. Also, involved in wound healing. On appropriate medications.  

## 2016-12-19 NOTE — Progress Notes (Signed)
MRN : [id number]  Megan Nixon is a 66 y.o. (04-17-1950) female who presents with chief complaint of  Chief Complaint  Patient presents with  . Follow-up    1 month no studies  .  History of Present Illness: Patient returns today in follow up of toe debridement about a month ago.  The wound is completely healed.  I removed her only remaining sutures today.  It is actually a little hard to see any major defect in the toe compared to the other toes.  No fever or chills.  Current Outpatient Prescriptions  Medication Sig Dispense Refill  . aspirin EC 81 MG tablet Take by mouth.    . Cyanocobalamin 1000 MCG LOZG Place under the tongue.    [name] glimepiride (AMARYL) 1 MG tablet Take 1 mg by mouth daily with breakfast.     . pantoprazole (PROTONIX) 40 MG tablet Take 1 tablet (40 mg total) by mouth daily. 40 tablet 0  . verapamil (VERELAN PM) 180 MG 24 hr capsule Take 180 mg by mouth daily.     . vitamin B-12 (CYANOCOBALAMIN) 1000 MCG tablet Take 1,000 mcg by mouth daily.     No current facility-administered medications for this visit.     Past Medical History:  Diagnosis Date  . Acute renal failure ([place]) 2018  . Benign essential hypertension 09/17/2016  . Diabetes mellitus type 2, controlled, with complications ([place]) 1/76/1607   Overview:  microalbuminuria  . Diabetes mellitus without complication ([place])   . Dyspnea   . GERD (gastroesophageal reflux disease)   . 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 Hypertension   . Medicare annual wellness visit, initial 06/16/2016   Overview:  4/18  . Microscopic hematuria 09/17/2016  . Nephrolithiasis 09/2016  . OSA (obstructive sleep apnea) 11/21/2013  . PVD (peripheral vascular disease) (HCC)    blisters on Lt foot sees Dr Lucky Cowboy  . Septic shock due to urinary tract infection ([place])   . Stroke [facility]) 04/2014    Past Surgical History:  Procedure Laterality Date  . BREAST EXCISIONAL BIOPSY  Bilateral 1971   neg  . COMBINED LAPAROSCOPY W/ HYSTEROSCOPY    . CYSTOSCOPY/URETEROSCOPY/HOLMIUM LASER/STENT PLACEMENT Left 10/20/2016   Procedure: CYSTOSCOPY/URETEROSCOPY/HOLMIUM LASER/STENT PLACEMENT;  Surgeon: Hollice Espy, MD;  Location: [facility] ORS;  Service: Urology;  Laterality: Left;  . EXTRACORPOREAL SHOCK WAVE LITHOTRIPSY  2013  . EXTRACORPOREAL SHOCK WAVE LITHOTRIPSY Left 10/02/2016   Procedure: EXTRACORPOREAL SHOCK WAVE LITHOTRIPSY (ESWL);  Surgeon: Hollice Espy, MD;  Location: [facility] ORS;  Service: Urology;  Laterality: Left;  . IR NEPHROSTOMY PLACEMENT LEFT  08/24/2016  . IRRIGATION AND DEBRIDEMENT FOOT Right 11/13/2016   Procedure: IRRIGATION AND DEBRIDEMENT RIGHT FOURTH TOE;  Surgeon: Algernon Huxley, MD;  Location: [facility] ORS;  Service: Vascular;  Laterality: Right;  [name] VAGINAL HYSTERECTOMY      Social History Social History  Substance Use Topics  . Smoking status: Never Smoker  . Smokeless tobacco: Never Used  . Alcohol use No      Family History Family History  Problem Relation Age of Onset  . Breast cancer Neg Hx   . Bladder Cancer Neg Hx   . Prostate cancer Neg Hx      No Known Allergies   REVIEW OF SYSTEMS (Negative unless checked)  Constitutional: _0 Weight loss  _1 Fever  _2 Chills Cardiac: _3 Chest pain   _4 Chest pressure   _5 Palpitations   _6 Shortness of breath when laying flat   _7   Shortness of breath at rest   _0 Shortness of breath with exertion. Vascular:  _1 Pain in legs with walking   _2 Pain in legs at rest   _3 Pain in legs when laying flat   _4 Claudication   _5 Pain in feet when walking  _6 Pain in feet at rest  _7 Pain in feet when laying flat   _8 History of DVT   _9 Phlebitis   _10 Swelling in legs   _11 Varicose veins   _12 Non-healing ulcers Pulmonary:   _13 Uses home oxygen   _14 Productive cough   _15 Hemoptysis   _16 Wheeze  _17 COPD   _18 Asthma Neurologic:  _19 Dizziness  _20 Blackouts   _21 Seizures   _22 History of stroke   _23 History of TIA  _24 Aphasia   _25 Temporary blindness    _26 Dysphagia   _27 Weakness or numbness in arms   _28 Weakness or numbness in legs Musculoskeletal:  _29 Arthritis   _30 Joint swelling   _31 Joint pain   _32 Low back pain Hematologic:  _33 Easy bruising  _34 Easy bleeding   _35 Hypercoagulable state   _36 Anemic   Gastrointestinal:  _37 Blood in stool   _38 Vomiting blood  _39 Gastroesophageal reflux/heartburn   _40 Abdominal pain Genitourinary:  _41 Chronic kidney disease   _42 Difficult urination  _43 Frequent urination  _44 Burning with urination   _45 Hematuria Skin:  _46 Rashes   _47 Ulcers   _48 Wounds Psychological:  _49 History of anxiety   _50  History of major depression.  Physical Examination  BP (!) 144/86 (BP Location: Left Arm)   Pulse 93   Resp 15   Ht _51  (1.651 m)   Wt 78.5 kg (173 lb)   BMI 28.79 kg/m  Gen:  WD/WN, NAD Head: [place]/AT, No temporalis wasting. Ear/Nose/Throat: Hearing grossly intact, nares w/o erythema or drainage, trachea midline Eyes: Conjunctiva clear. Sclera non-icteric Neck: Supple.  No JVD.  Pulmonary:  Good air movement, no use of accessory muscles.  Cardiac: RRR, normal S1, S2 Vascular: Incision has healed without any remaining wound.  No residual gangrenous changes noted Vessel Right Left  Radial Palpable Palpable                          PT Palpable Palpable  DP Palpable Palpable    Musculoskeletal: M/S 5/5 throughout.  No deformity or atrophy Neurologic: Sensation grossly intact in extremities.  Symmetrical.  Speech is fluent.  Psychiatric: Judgment intact, Mood & affect appropriate for pt's clinical situation. Dermatologic: No rashes or ulcers noted.  No cellulitis or open wounds.       Labs Recent Results (from the past 2160 hour(s))  Glucose, capillary     Status: Abnormal   Collection Time: 10/02/16  8:11 AM  Result Value Ref Range   Glucose-Capillary 109 (H) 65 - 99 mg/dL  Glucose, capillary     Status: Abnormal   Collection Time: 10/20/16  6:18 AM  Result Value Ref Range   Glucose-Capillary 132 (H) 65 - 99  mg/dL  Stone analysis     Status: None   Collection Time: 10/20/16  9:08 AM  Result Value Ref Range   Color Brown    Size Comment mm    Comment: Specimen received as fragments.   Stone Weight KSTONE 128.0 mg   Nidus No Nidus visualized    Ca Oxalate,Monohydr. 95 %   Ca phos cry stone ql IR 5 %   Composition Comment     Comment: Percentage (Represents the % composition)   STONE COMMENT Note:     Comment: (NOTE) Please do not submit specimens on Q-Tips, in tape, on filters, or  in liquids such as blood, urine or formalin.  This may cause unnecessary biohazards, erroneous results and/or delay in the processing of the specimen.    Photo Comment     Comment: Photograph will follow under separate cover.   Comment: Comment     Comment: (NOTE) Physician questions regarding Calculi Analysis contact [facility] at: [phone].    PLEASE NOTE: Comment     Comment: (NOTE) Calculi report with photograph will follow via computer, mail or courier delivery.    Disclaimer - Kidney Stone Analysis: Comment     Comment: (NOTE) This test was developed and its performance characteristics determined by [facility]. It has not been cleared or approved by the Food and Drug Administration. Performed At: [facility] [place], [place] [id number] Lindon Romp MD SJ:[id number]   Glucose, capillary     Status: Abnormal   Collection Time: 10/20/16 10:04 AM  Result Value Ref Range   Glucose-Capillary 171 (H) 65 - 99 mg/dL  Urinalysis, Complete     Status: Abnormal   Collection Time: 10/31/16 11:39 AM  Result Value Ref Range   Specific Gravity, UA 1.025 1.005 - 1.030   pH, UA 7.0 5.0 - 7.5   Color, UA Yellow Yellow   Appearance Ur Cloudy (A) Clear   Leukocytes, UA 2+ (A) Negative   Protein, UA 2+ (A) Negative/Trace   Glucose, UA Negative Negative   Ketones, UA Negative Negative   RBC, UA 3+ (A) Negative   Bilirubin, UA Negative Negative   Urobilinogen, Ur 0.2 0.2 - 1.0  mg/dL   Nitrite, UA Negative Negative   Microscopic Examination See below:   Microscopic Examination     Status: Abnormal   Collection Time: 10/31/16 11:39 AM  Result Value Ref Range   WBC, UA 0-5 0 - 5 /hpf   RBC, UA >30 (H) 0 - 2 /hpf   Epithelial Cells (non renal) 0-10 0 - 10 /hpf   Mucus, UA Present (A) Not Estab.   Bacteria, UA Few (A) None seen/Few  APTT     Status: None   Collection Time: 11/13/16  1:37 PM  Result Value Ref Range   aPTT 36 24 - 36 seconds  Basic metabolic panel     Status: Abnormal   Collection Time: 11/13/16  1:37 PM  Result Value Ref Range   Sodium 141 135 - 145 mmol/L   Potassium 3.1 (L) 3.5 - 5.1 mmol/L   Chloride 107 101 - 111 mmol/L   CO2 25 22 - 32 mmol/L   Glucose, Bld 88 65 - 99 mg/dL   BUN 18 6 - 20 mg/dL   Creatinine, Ser 0.86 0.44 - 1.00 mg/dL   Calcium 10.0 8.9 - 10.3 mg/dL   GFR calc non Af Amer >60 >60 mL/min   GFR calc Af Amer >60 >60 mL/min    Comment: (NOTE) The eGFR has been calculated using the CKD EPI equation. This calculation has not been validated in all clinical situations. eGFR's persistently <60 mL/min signify possible Chronic Kidney Disease.    Anion gap 9 5 - 15  CBC WITH DIFFERENTIAL     Status: Abnormal   Collection Time: 11/13/16  1:37 PM  Result Value Ref Range   WBC 7.3 3.6 - 11.0 K/uL   RBC 4.13 3.80 - 5.20 MIL/uL   Hemoglobin 11.8 (L) 12.0 - 16.0 g/dL   HCT 35.4 35.0 - 47.0 %   MCV 85.7 80.0 - 100.0 fL   MCH 28.5 26.0 -  34.0 pg   MCHC 33.3 32.0 - 36.0 g/dL   RDW 15.0 (H) 11.5 - 14.5 %   Platelets 179 150 - 440 K/uL   Neutrophils Relative % 49 %   Neutro Abs 3.6 1.4 - 6.5 K/uL   Lymphocytes Relative 42 %   Lymphs Abs 3.1 1.0 - 3.6 K/uL   Monocytes Relative 7 %   Monocytes Absolute 0.5 0.2 - 0.9 K/uL   Eosinophils Relative 1 %   Eosinophils Absolute 0.1 0 - 0.7 K/uL   Basophils Relative 1 %   Basophils Absolute 0.0 0 - 0.1 K/uL  Protime-INR     Status: None   Collection Time: 11/13/16  1:37 PM  Result  Value Ref Range   Prothrombin Time 14.7 11.4 - 15.2 seconds   INR 1.16   Type and screen     Status: None   Collection Time: 11/13/16  1:37 PM  Result Value Ref Range   ABO/RH(D) O POS    Antibody Screen NEG    Sample Expiration 11/16/2016   Glucose, capillary     Status: None   Collection Time: 11/13/16  2:04 PM  Result Value Ref Range   Glucose-Capillary 89 65 - 99 mg/dL  Surgical pathology     Status: None   Collection Time: 11/13/16  4:46 PM  Result Value Ref Range   SURGICAL PATHOLOGY      Surgical Pathology CASE: [id number] PATIENT: [facility] Surgical Pathology Report     SPECIMEN SUBMITTED: A. Toe, right fourth, partial tip  CLINICAL HISTORY: None provided  PRE-OPERATIVE DIAGNOSIS: Gangrene tip of toe  POST-OPERATIVE DIAGNOSIS: Gangrene tip of right fourth toe     DIAGNOSIS: A.  FOURTH RIGHT TOE, PARTIAL TIP; AMPUTATION: - ULCERATION AND GRANULATION TISSUE.   GROSS DESCRIPTION:  A. Labeled: partial tip right fourth toe  Size: 2 fragments, 0.5 x 0.5 x 0.2 cm and 1.5 x 1.2 x 0.4 cm  Description of lesion(s): on the largest fragment one edge is possible healing ulcer  Proximal margin: 2 fragments of tissue therefore true margin cannot be identified, fragment with ulcer has discoloration to one edge  Bone: no bone identified  Other findings: specimens differentially inked  Block summary: 1-2-smallest fragment submitted intact and largest fragment sectioned (tissue entirely submitted)   Final Diagnosis performed by Izora Ribas, MD.  Electronically signed 11/17/2016 12:25:10PM    The electronic signature indicates that the named Attending Pathologist has evaluated the specimen  Technical component performed at [facility], [address], [place], [place] [address] Lab: [phone] Dir: Darrick Penna. Evette Doffing, MD  Professional component performed at [facility], [facility], [place], [place], [place] [address] Lab:  [phone] Dir: Dellia Nims. Rubinas, MD    Glucose, capillary     Status: None   Collection Time: 11/13/16  5:09 PM  Result Value Ref Range   Glucose-Capillary 85 65 - 99 mg/dL    Radiology US Renal  Result Date: 11/24/2016 CLINICAL DATA:  Kidney stones EXAM: RENAL / URINARY TRACT ULTRASOUND COMPLETE COMPARISON:  Radiograph 10/09/2016, CT 08/30/2016 FINDINGS: Right Kidney: Length: 11.9 cm. Echogenicity within normal limits. No mass or hydronephrosis visualized. Left Kidney: Length: 11.6 cm. Echogenicity within normal limits. Negative for hydronephrosis. Cyst in the upper pole measuring 2.2 x 2 x 1.8 cm Bladder: Appears normal for degree of bladder distention. Incidental note made of slightly enlarged spleen. IMPRESSION: 1. Negative for hydronephrosis. Previously noted left renal stones are not seen sonographically 2. Cyst in the upper pole  of the left kidney 3. Enlarged spleen Electronically Signed   By: Donavan Foil M.D.   On: 11/24/2016 23:47      Assessment/Plan  Diabetes mellitus type 2, controlled, with complications ([place]) blood glucose control important in reducing the progression of atherosclerotic disease. Also, involved in wound healing. On appropriate medications.   Gangrene of toe of left foot ([place]) She had tips of her toes which were gangrenous bilaterally.  Only 1 of these ultimately required a debridement.  This has now healed.  She can follow-up on an as-needed basis.    Leotis Pain, MD  12/19/2016 11:56 AM    This note was created with Dragon medical transcription system.  Any errors from dictation are purely unintentional

## 2016-12-19 NOTE — Assessment & Plan Note (Signed)
She had tips of her toes which were gangrenous bilaterally.  Only 1 of these ultimately required a debridement.  This has now healed.  She can follow-up on an as-needed basis.

## 2016-12-30 ENCOUNTER — Other Ambulatory Visit: Payer: Self-pay | Admitting: Internal Medicine

## 2016-12-30 DIAGNOSIS — Z1231 Encounter for screening mammogram for malignant neoplasm of breast: Secondary | ICD-10-CM

## 2017-01-22 ENCOUNTER — Ambulatory Visit
Admission: RE | Admit: 2017-01-22 | Discharge: 2017-01-22 | Disposition: A | Discharge status: Home / Self Care | Payer: Medicare PPO | Source: Ambulatory Visit | Attending: Internal Medicine | Admitting: Internal Medicine

## 2017-01-22 DIAGNOSIS — Z1231 Encounter for screening mammogram for malignant neoplasm of breast: Secondary | ICD-10-CM | POA: Insufficient documentation

## 2017-03-12 ENCOUNTER — Other Ambulatory Visit: Payer: Self-pay | Admitting: Orthopedic Surgery

## 2017-03-12 DIAGNOSIS — M25561 Pain in right knee: Secondary | ICD-10-CM

## 2017-03-24 ENCOUNTER — Ambulatory Visit
Admission: RE | Admit: 2017-03-24 | Discharge: 2017-03-24 | Disposition: A | Discharge status: Home / Self Care | Payer: Medicare PPO | Source: Ambulatory Visit | Attending: Orthopedic Surgery | Admitting: Orthopedic Surgery

## 2017-03-24 DIAGNOSIS — M2391 Unspecified internal derangement of right knee: Secondary | ICD-10-CM | POA: Diagnosis not present

## 2017-03-24 DIAGNOSIS — M25561 Pain in right knee: Secondary | ICD-10-CM | POA: Diagnosis not present

## 2017-03-24 DIAGNOSIS — M25461 Effusion, right knee: Secondary | ICD-10-CM | POA: Insufficient documentation

## 2017-09-22 IMAGING — CR DG ABDOMEN 1V
1 series · 2 of 2 positions shown · non-contrast
Comparison: One-view abdomen 09/03/2016

CLINICAL DATA: Lithotripsy today.

EXAM:
ABDOMEN - 1 VIEW

[Series 1: dg abd 1 view · 0.14mm/px · 2 of 2 slices shown]
[im 1/2]
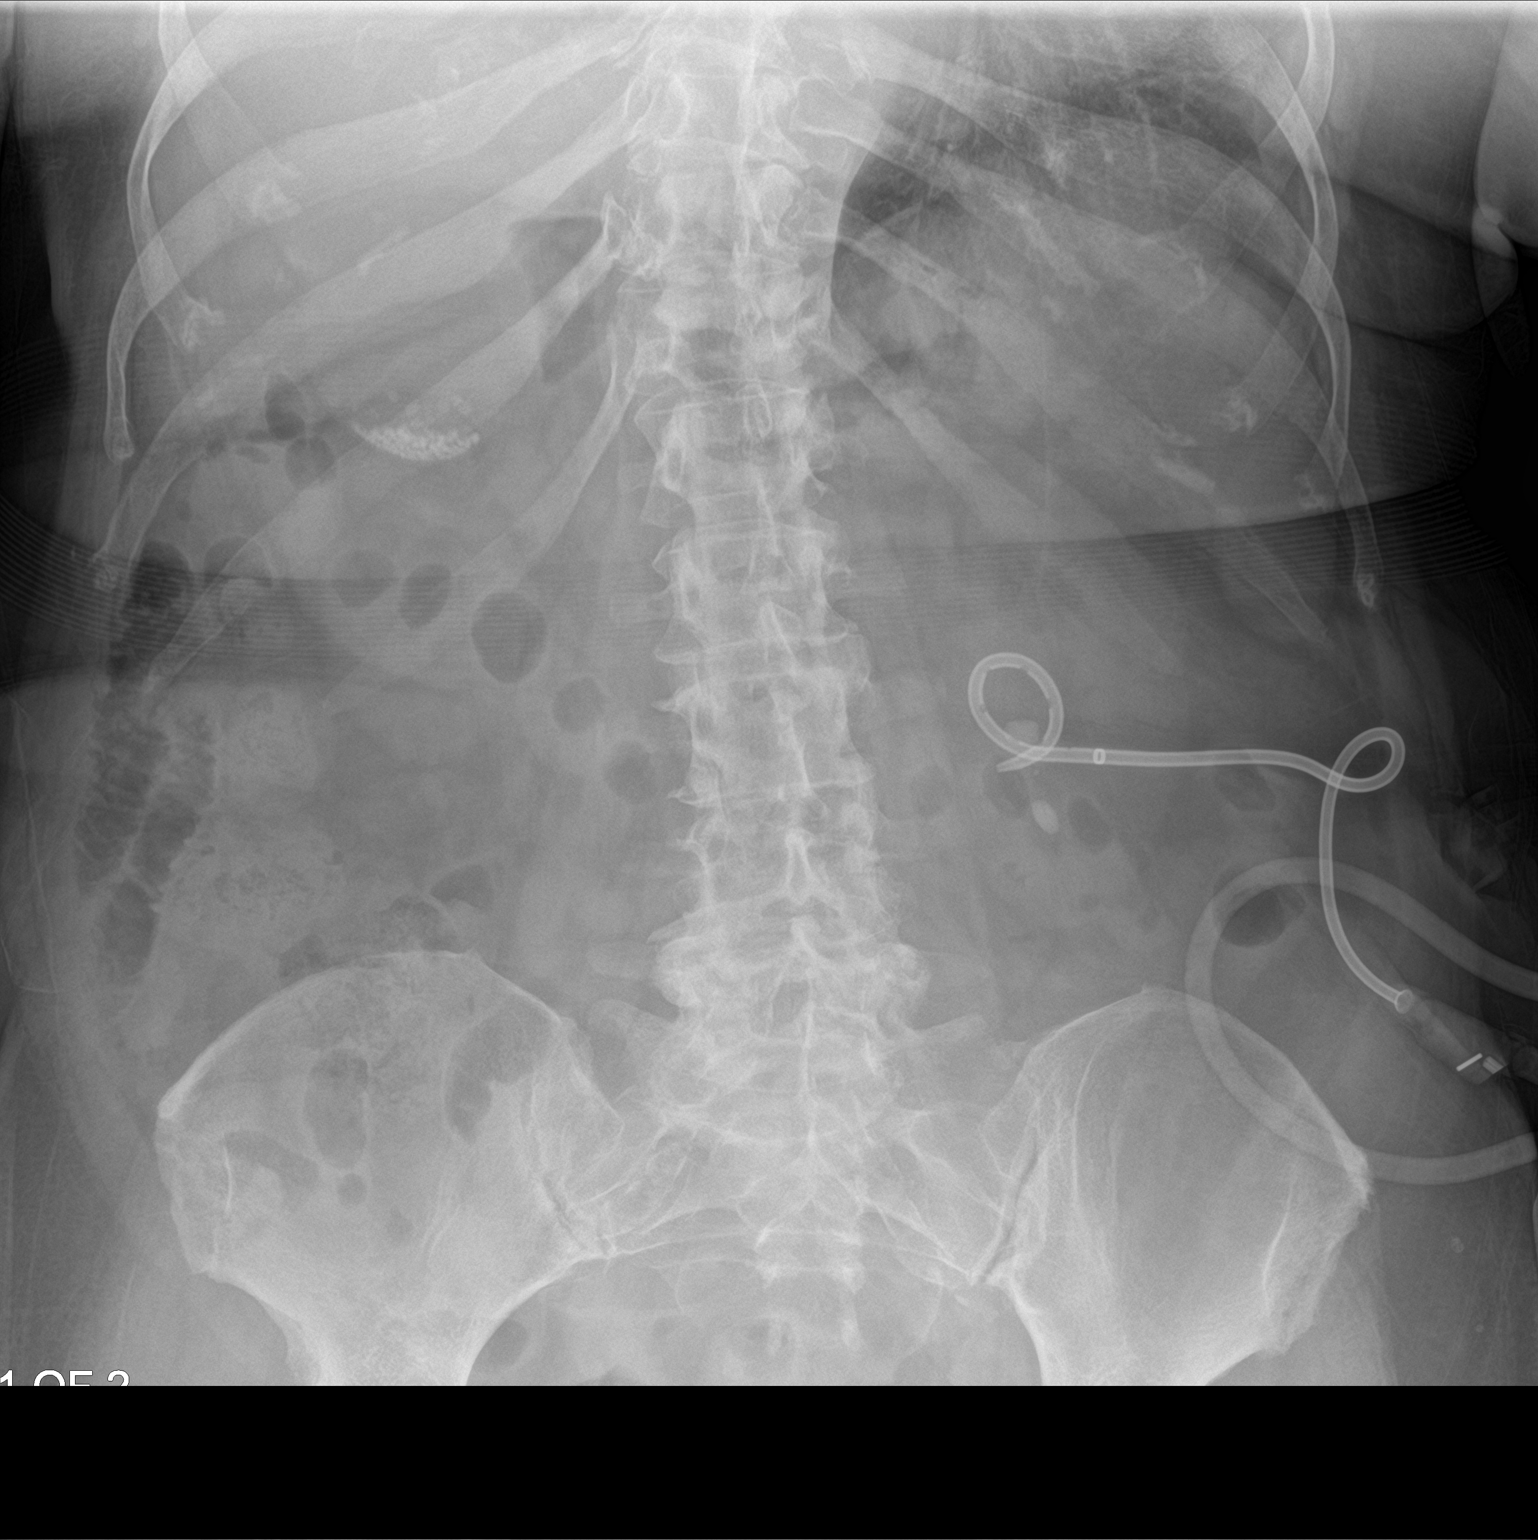
[im 2/2]
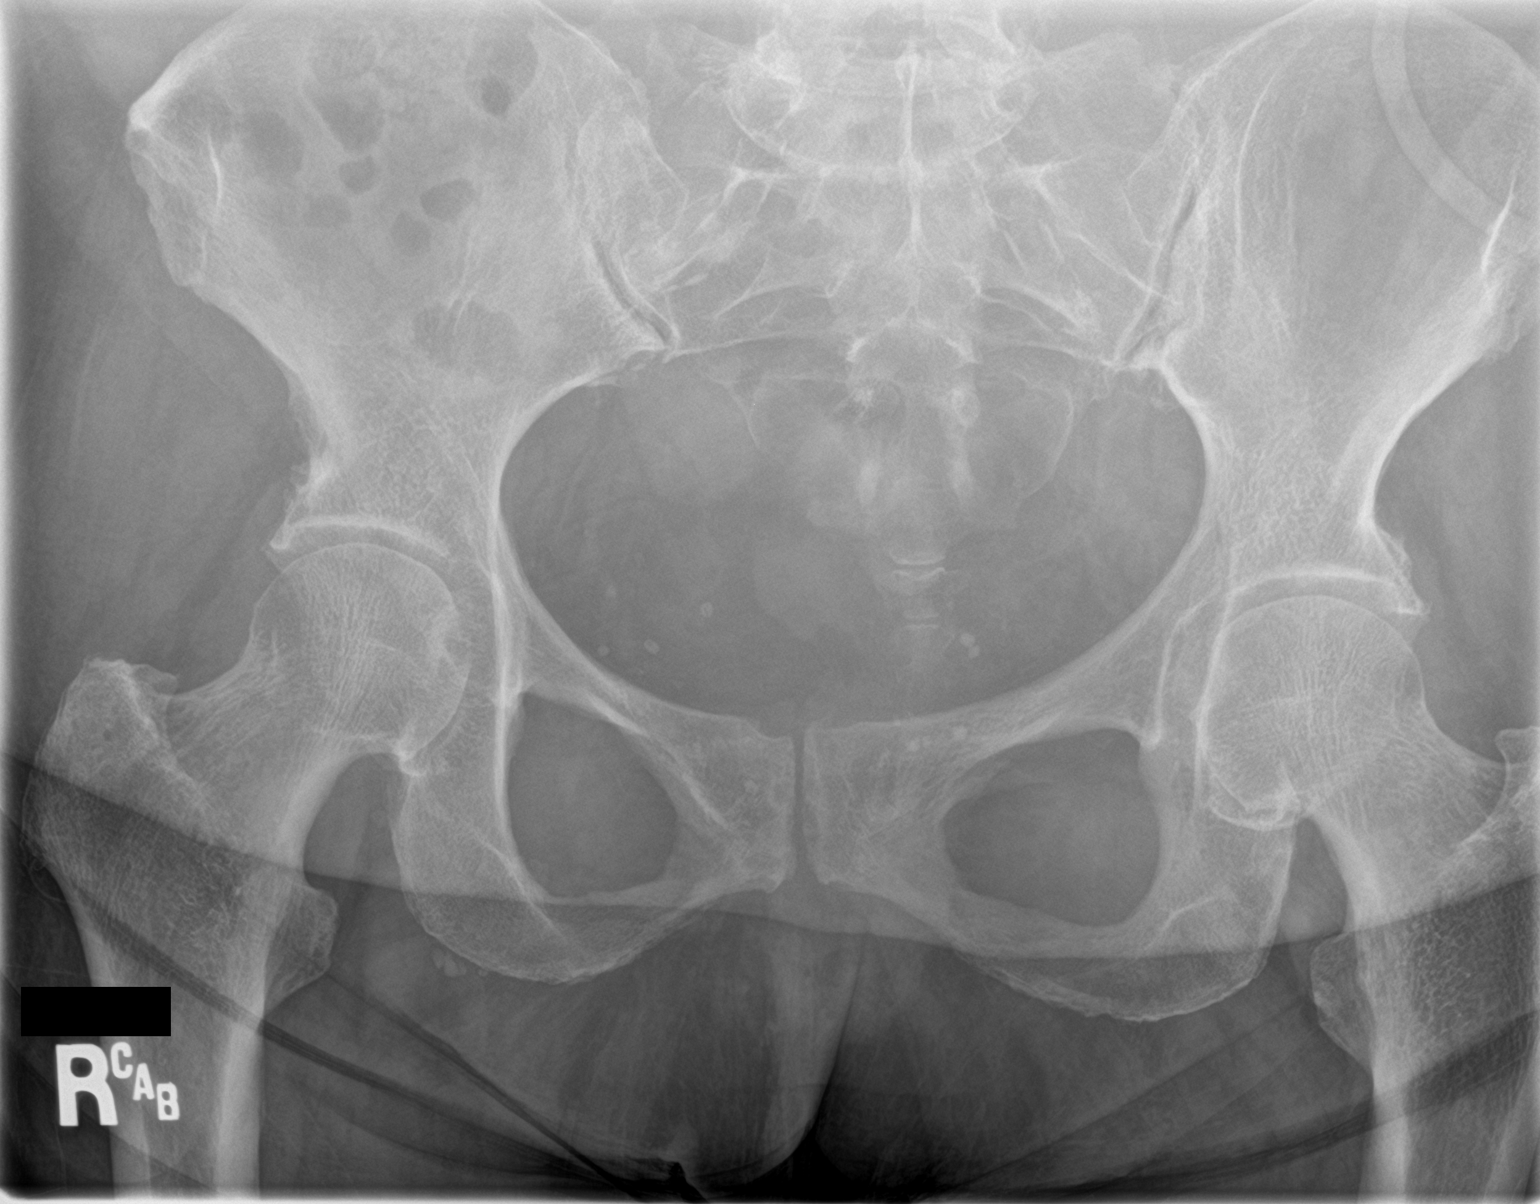

[2 of 2 positions shown; findings below may reference images not displayed]

FINDINGS: A left-sided nephrostomy tube is in place. Fell 2 discrete left
renal stones are noted. Layering stones are present in the
gallbladder. Pelvic phleboliths are again noted.
IMPRESSION: 1. Stable appearance of left-sided nephrolithiasis.
2. Left-sided percutaneous nephrostomy tube.
3. Cholelithiasis.

## 2017-11-24 ENCOUNTER — Ambulatory Visit
Admission: RE | Admit: 2017-11-24 | Discharge: 2017-11-24 | Disposition: A | Discharge status: Home / Self Care | Payer: Medicare PPO | Source: Ambulatory Visit | Attending: Urology | Admitting: Urology

## 2017-11-24 DIAGNOSIS — N200 Calculus of kidney: Secondary | ICD-10-CM

## 2017-11-25 ENCOUNTER — Encounter: Payer: Self-pay | Admitting: Urology

## 2017-11-25 ENCOUNTER — Ambulatory Visit (INDEPENDENT_AMBULATORY_CARE_PROVIDER_SITE_OTHER): Payer: Medicare PPO | Admitting: Urology

## 2017-11-25 ENCOUNTER — Other Ambulatory Visit: Payer: Self-pay

## 2017-11-25 VITALS — BP 126/84 | HR 89 | Ht 65.0 in | Wt 188.6 lb

## 2017-11-25 DIAGNOSIS — Z87442 Personal history of urinary calculi: Secondary | ICD-10-CM

## 2017-11-25 NOTE — Progress Notes (Signed)
11/25/2017 10:00 AM   Megan Nixon 09-Dec-1950 [id number]  Referring provider: Danella Penton, MD [phone] [facility] [address] [facility] [place], [place] [address]  Chief Complaint  Patient presents with  . Nephrolithiasis    f/u KUB    HPI: 67 year old female with a personal history of kidney stones returns today for routine annual follow-up.  She presented just over a year ago with profound sepsis requiring ICU admission, pressors, intubation, and complications including dry gangrene as a result of a 1 cm obstructing left renal pelvic stone with profound urosepsis.  She ultimately was treated with shockwave lithotripsy which failed and ultimately underwent ureteroscopy which was uncomplicated.  Today, she is been doing very well.  She has had no stone episodes over the past year.  No flank pain or gross hematuria.  She is doing very well.  She is trying to drink plenty of water.  KUB today shows no stone burden.  She does have a personal history of kidney stones.  She underwent shockwave lithotripsy 2013.  Stone analysis consistent with 95% calcium oxalate monohydrate, 5% calcium phosphate.   PMH: Past Medical History:  Diagnosis Date  . Acute renal failure (HCC) 2018  . Benign essential hypertension 09/17/2016  . Diabetes mellitus type 2, controlled, with complications (HCC) 11/21/2013   Overview:  microalbuminuria  . Diabetes mellitus without complication (HCC)   . Dyspnea   . GERD (gastroesophageal reflux disease)   . 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 Hypertension   . Medicare annual wellness visit, initial 06/16/2016   Overview:  4/18  . Microscopic hematuria 09/17/2016  . Nephrolithiasis 09/2016  . OSA (obstructive sleep apnea) 11/21/2013  . PVD (peripheral vascular disease) (HCC)    blisters on Lt foot sees Dr Wyn Quaker  . Septic shock due to urinary tract infection (HCC)   . Stroke [facility]) 04/2014     Surgical History: Past Surgical History:  Procedure Laterality Date  . BREAST EXCISIONAL BIOPSY Bilateral 1971   neg  . COMBINED LAPAROSCOPY W/ HYSTEROSCOPY    . CYSTOSCOPY/URETEROSCOPY/HOLMIUM LASER/STENT PLACEMENT Left 10/20/2016   Procedure: CYSTOSCOPY/URETEROSCOPY/HOLMIUM LASER/STENT PLACEMENT;  Surgeon: Vanna Scotland, MD;  Location: [facility] ORS;  Service: Urology;  Laterality: Left;  . EXTRACORPOREAL SHOCK WAVE LITHOTRIPSY  2013  . EXTRACORPOREAL SHOCK WAVE LITHOTRIPSY Left 10/02/2016   Procedure: EXTRACORPOREAL SHOCK WAVE LITHOTRIPSY (ESWL);  Surgeon: Vanna Scotland, MD;  Location: [facility] ORS;  Service: Urology;  Laterality: Left;  . IR NEPHROSTOMY PLACEMENT LEFT  08/24/2016  . IRRIGATION AND DEBRIDEMENT FOOT Right 11/13/2016   Procedure: IRRIGATION AND DEBRIDEMENT RIGHT FOURTH TOE;  Surgeon: Annice Needy, MD;  Location: [facility] ORS;  Service: Vascular;  Laterality: Right;  [name] VAGINAL HYSTERECTOMY      Home Medications:  Allergies as of 11/25/2017   No Known Allergies     Medication List        Accurate as of 11/25/17 11:59 PM. Always use your most recent med list.          aspirin EC 81 MG tablet Take by mouth.   glimepiride 1 MG tablet Commonly known as:  AMARYL Take 1 mg by mouth daily with breakfast.   pantoprazole 40 MG tablet Commonly known as:  PROTONIX Take 1 tablet (40 mg total) by mouth daily.   verapamil 180 MG 24 hr capsule Commonly known as:  VERELAN PM Take 180 mg by mouth daily.   vitamin B-12 1000 MCG tablet Commonly  known as:  CYANOCOBALAMIN Take 1,000 mcg by mouth daily.   Cyanocobalamin 1000 MCG Lozg Place under the tongue.       Allergies: No Known Allergies  Family History: Family History  Problem Relation Age of Onset  . Breast cancer Neg Hx   . Bladder Cancer Neg Hx   . Prostate cancer Neg Hx     Social History:  reports that she has never smoked. She has never used smokeless tobacco. She reports that she does not drink alcohol or  use drugs.  ROS: UROLOGY Frequent Urination?: No Hard to postpone urination?: No Burning/pain with urination?: No Get up at night to urinate?: No Leakage of urine?: No Urine stream starts and stops?: No Trouble starting stream?: No Do you have to strain to urinate?: No Blood in urine?: No Urinary tract infection?: No Sexually transmitted disease?: No Injury to kidneys or bladder?: No Painful intercourse?: No Weak stream?: No Currently pregnant?: No Vaginal bleeding?: No Last menstrual period?: n  Gastrointestinal Nausea?: No Vomiting?: No Indigestion/heartburn?: No Diarrhea?: No Constipation?: No  Constitutional Fever: No Night sweats?: No Weight loss?: No Fatigue?: No  Skin Skin rash/lesions?: No Itching?: No  Eyes Blurred vision?: No Double vision?: No  Ears/Nose/Throat Sore throat?: No Sinus problems?: No  Hematologic/Lymphatic Swollen glands?: No Easy bruising?: No  Cardiovascular Leg swelling?: No Chest pain?: No  Respiratory Cough?: No Shortness of breath?: No  Endocrine Excessive thirst?: No  Musculoskeletal Back pain?: No Joint pain?: No  Neurological Headaches?: No Dizziness?: No  Psychologic Depression?: No Anxiety?: No  Physical Exam: BP 126/84   Pulse 89   Ht 5\' 5"  (1.651 m)   Wt 188 lb 9.6 oz (85.5 kg)   BMI 31.38 kg/m   Constitutional:  Alert and oriented, No acute distress. HEENT: [place] AT, moist mucus membranes.  Trachea midline, no masses. Cardiovascular: No clubbing, cyanosis, or edema. Respiratory: Normal respiratory effort, no increased work of breathing. GI: Abdomen is soft, nontender, nondistended, no abdominal masses GU: No CVA tenderness Skin: No rashes, bruises or suspicious lesions. Neurologic: Grossly intact, no focal deficits, moving all 4 extremities. Psychiatric: Normal mood and affect.  Laboratory Data: Lab Results  Component Value Date   WBC 7.3 11/13/2016   HGB 11.8 (L) 11/13/2016   HCT 35.4  11/13/2016   MCV 85.7 11/13/2016   PLT 179 11/13/2016    Lab Results  Component Value Date   CREATININE 0.86 11/13/2016   Urinalysis N/a  Pertinent Imaging: Results for orders placed during the hospital encounter of 11/24/17  Abdomen 1 view (KUB)   Narrative CLINICAL DATA:  One year follow-up left kidney stones. Currently asymptomatic.  EXAM: ABDOMEN - 1 VIEW  COMPARISON:  10/09/2016  FINDINGS: Bowel gas pattern is nonobstructive. Mild fecal retention throughout the colon. Evidence of cholelithiasis. No calcifications projecting over the kidneys or ureters. Multiple pelvic phleboliths unchanged. Mild degenerative change of the spine and hips.  IMPRESSION: Nonobstructive bowel gas pattern.  No definite evidence of nephroureterolithiasis.   Electronically Signed   By: Elberta Fortis M.D.   On: 11/24/2017 15:50    KUB personally reviewed today and with the patient  Assessment & Plan:    1. History of kidney stones Remained stone free based on most recent KUB Otherwise asymptomatic We carefully reviewed signs and symptoms of kidney stones given her profound presentations a year ago She will follow-up as needed We did review her you stone diet recommendations.   Return if symptoms worsen or fail to improve.  Hollice Espy, MD  [facility] [address], [place], [place] [address] [phone]

## 2017-12-29 ENCOUNTER — Other Ambulatory Visit: Payer: Self-pay | Admitting: Internal Medicine

## 2017-12-29 DIAGNOSIS — Z1231 Encounter for screening mammogram for malignant neoplasm of breast: Secondary | ICD-10-CM

## 2018-01-29 ENCOUNTER — Ambulatory Visit
Admission: RE | Admit: 2018-01-29 | Discharge: 2018-01-29 | Disposition: A | Discharge status: Home / Self Care | Payer: Medicare PPO | Source: Ambulatory Visit | Attending: Internal Medicine | Admitting: Internal Medicine

## 2018-01-29 DIAGNOSIS — Z1231 Encounter for screening mammogram for malignant neoplasm of breast: Secondary | ICD-10-CM | POA: Diagnosis present

## 2019-01-04 ENCOUNTER — Other Ambulatory Visit: Payer: Self-pay | Admitting: Internal Medicine

## 2019-01-04 DIAGNOSIS — Z1231 Encounter for screening mammogram for malignant neoplasm of breast: Secondary | ICD-10-CM

## 2019-02-01 ENCOUNTER — Ambulatory Visit
Admission: RE | Admit: 2019-02-01 | Discharge: 2019-02-01 | Disposition: A | Discharge status: Home / Self Care | Payer: Medicare PPO | Source: Ambulatory Visit | Attending: Internal Medicine | Admitting: Internal Medicine

## 2019-02-01 DIAGNOSIS — Z1231 Encounter for screening mammogram for malignant neoplasm of breast: Secondary | ICD-10-CM | POA: Diagnosis not present

## 2019-03-31 ENCOUNTER — Ambulatory Visit: Payer: Medicare PPO

## 2019-04-01 ENCOUNTER — Ambulatory Visit: Payer: Medicare PPO | Attending: Internal Medicine

## 2019-04-01 DIAGNOSIS — Z23 Encounter for immunization: Secondary | ICD-10-CM | POA: Insufficient documentation

## 2019-04-01 NOTE — Progress Notes (Signed)
   Covid-19 Vaccination Clinic  Name:  Megan Nixon    MRN: [id number] DOB: 08/02/1950  04/01/2019  [name] was observed post Covid-19 immunization for 15 minutes without incidence. She was provided with Vaccine Information Sheet and instruction to access the V-Safe system.   [name] was instructed to call 911 with any severe reactions post vaccine: [name] Difficulty breathing  . Swelling of your face and throat  . A fast heartbeat  . A bad rash all over your body  . Dizziness and weakness    Immunizations Administered    Name Date Dose VIS Date Route   Pfizer COVID-19 Vaccine 04/01/2019 10:42 AM 0.3 mL 02/04/2019 Intramuscular   Manufacturer: [employer], [employer]   Lot: [id number]   NDC: [address]-0

## 2019-04-26 ENCOUNTER — Ambulatory Visit: Payer: Medicare PPO | Attending: Internal Medicine

## 2019-04-26 DIAGNOSIS — Z23 Encounter for immunization: Secondary | ICD-10-CM | POA: Insufficient documentation

## 2019-04-26 NOTE — Progress Notes (Signed)
   Covid-19 Vaccination Clinic  Name:  Megan Nixon    MRN: [id number] DOB: 13-Nov-1950  04/26/2019  [name] was observed post Covid-19 immunization for 15 minutes without incident. She was provided with Vaccine Information Sheet and instruction to access the V-Safe system.   [name] was instructed to call 911 with any severe reactions post vaccine: [name] Difficulty breathing  . Swelling of face and throat  . A fast heartbeat  . A bad rash all over body  . Dizziness and weakness   Immunizations Administered    Name Date Dose VIS Date Route   Pfizer COVID-19 Vaccine 04/26/2019 11:01 AM 0.3 mL 02/04/2019 Intramuscular   Manufacturer: [employer], [employer]   Lot: [id number]   NDC: [address]-8

## 2019-09-01 IMAGING — US US RENAL
1 series · 14 of 25 positions shown · non-contrast
Comparison: Radiograph 10/09/2016, CT 08/30/2016

CLINICAL DATA: Kidney stones

EXAM:
RENAL / URINARY TRACT ULTRASOUND COMPLETE

[Series 1: us renal · 0.23mm/px · 14 of 38 slices shown]
[im 1/38]
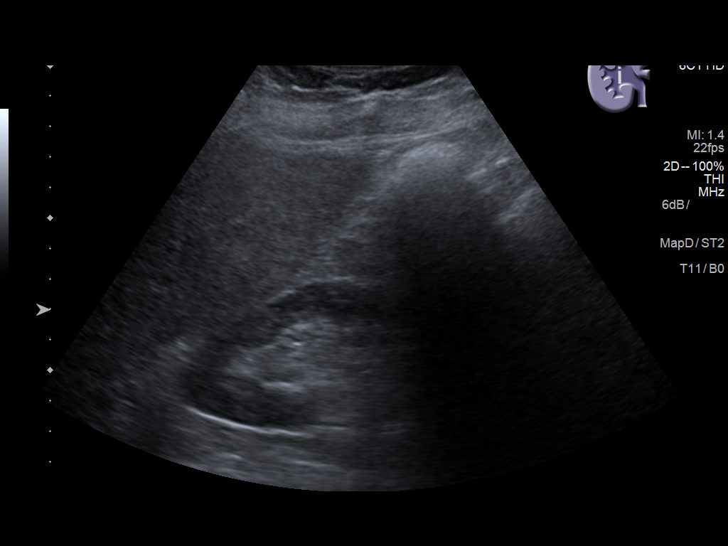
[im 4/38]
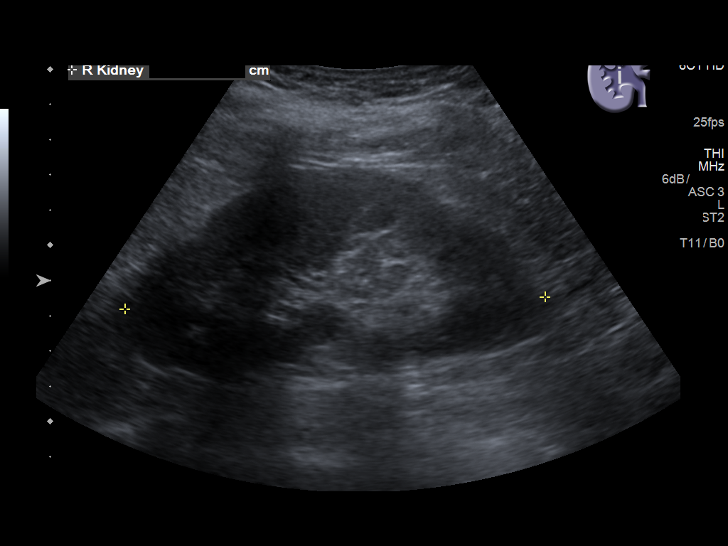
[im 7/38]
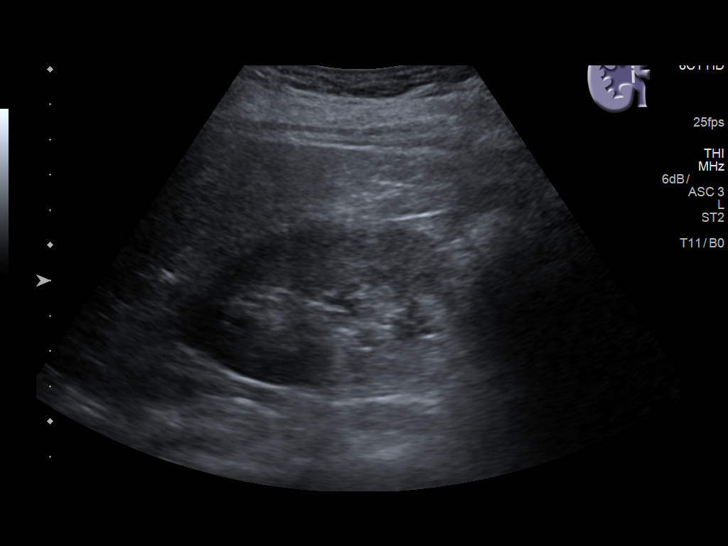
[im 10/38]
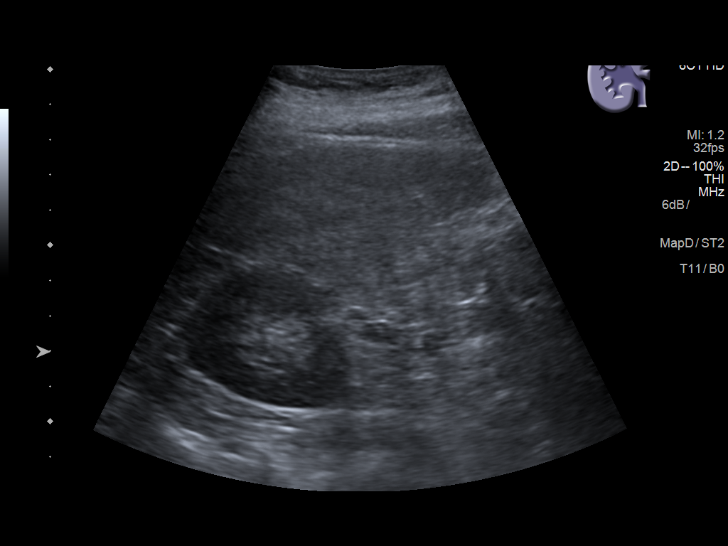
[im 13/38]
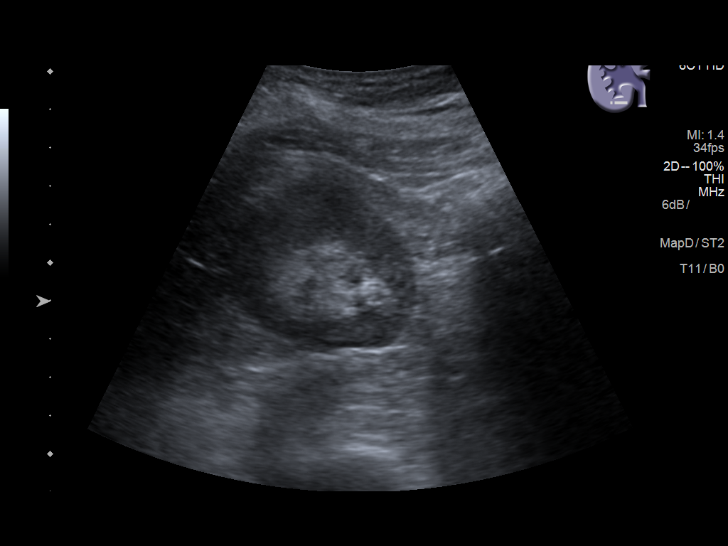
[im 14/38]
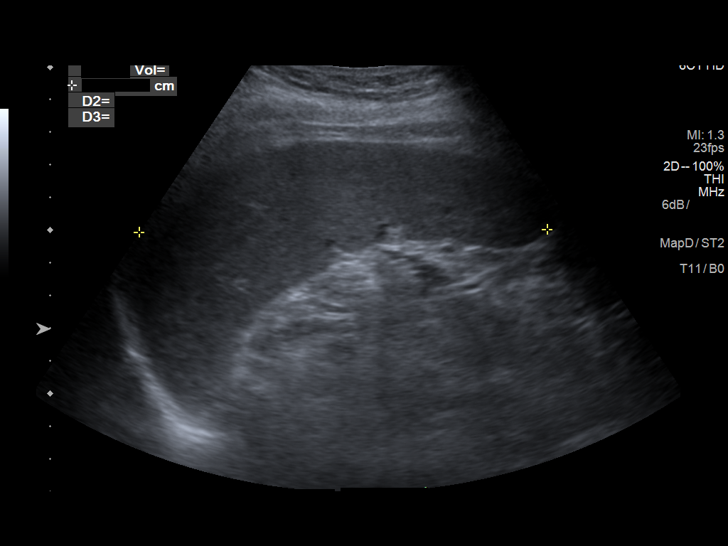
[im 17/38]
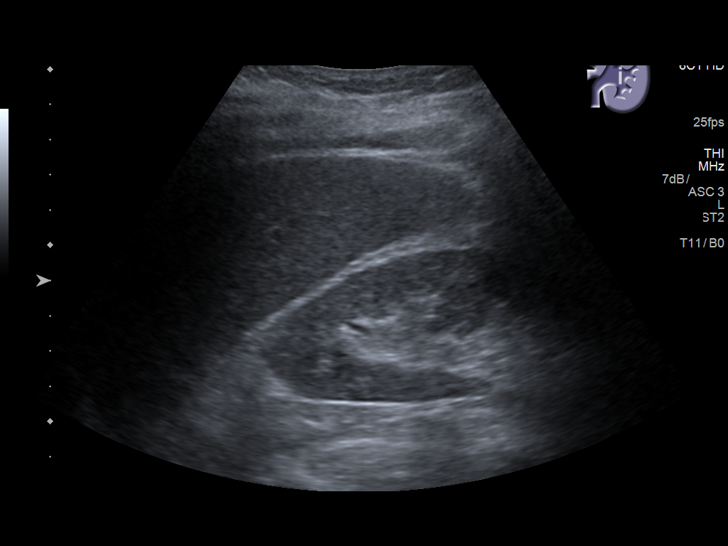
[im 21/38]
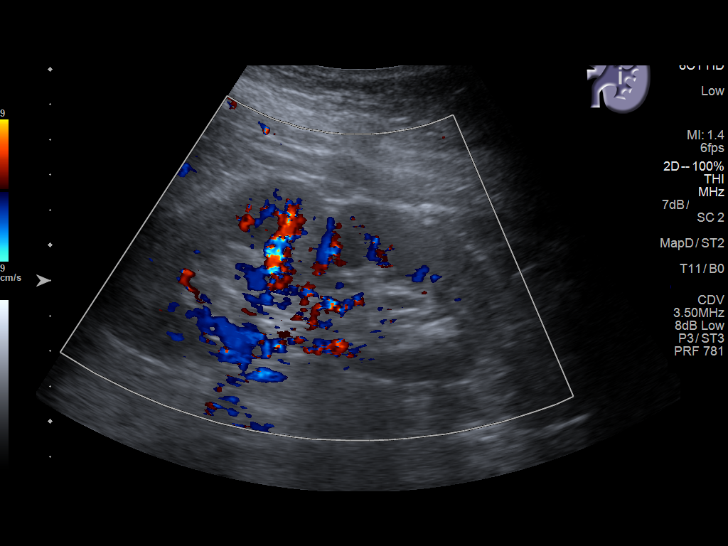
[im 24/38]
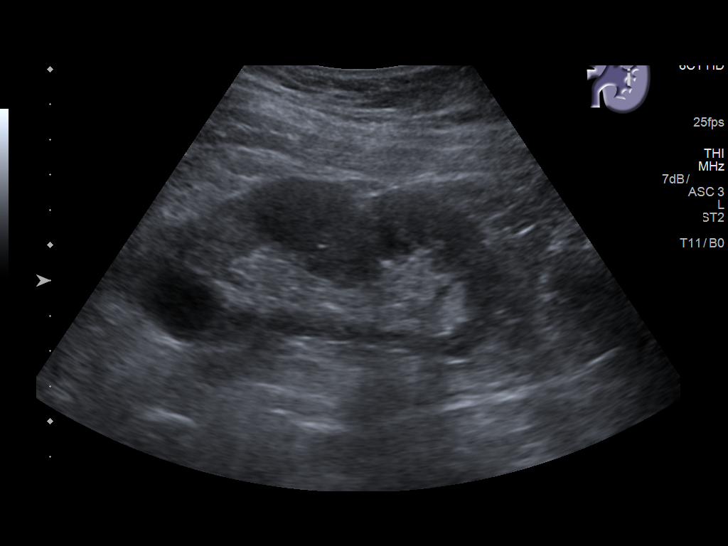
[im 25/38]
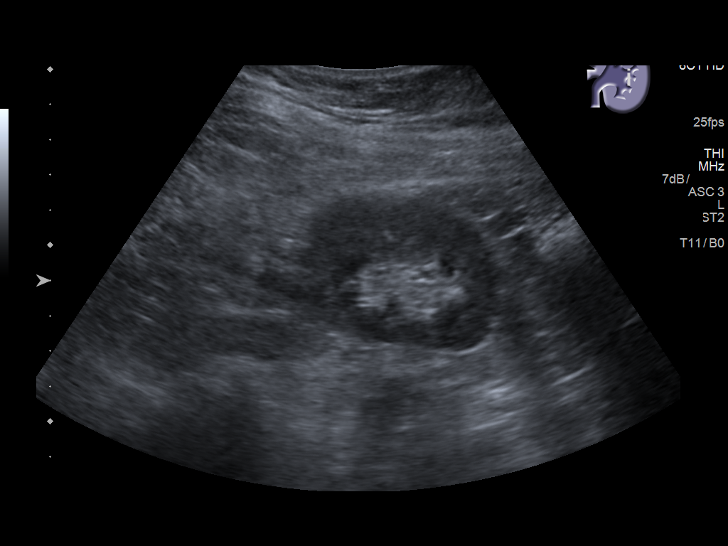
[im 28/38]
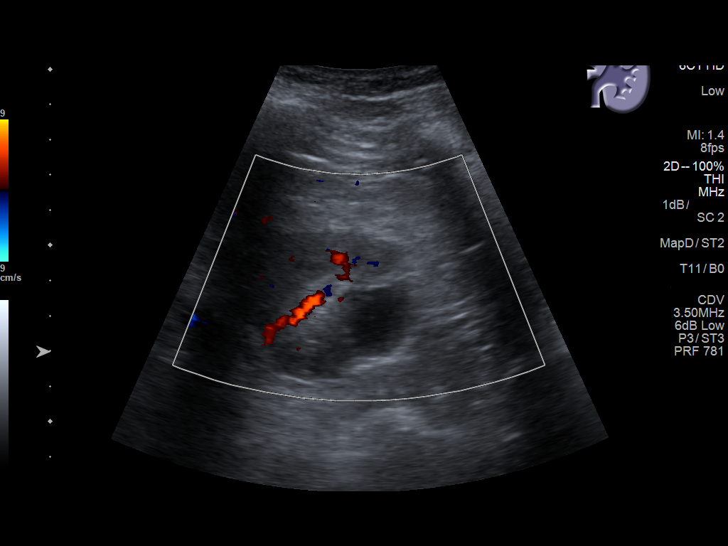
[im 31/38]
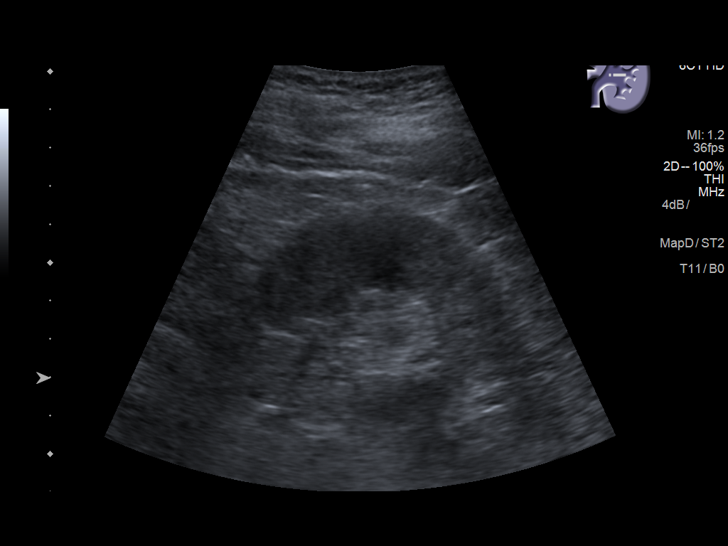
[im 34/38]
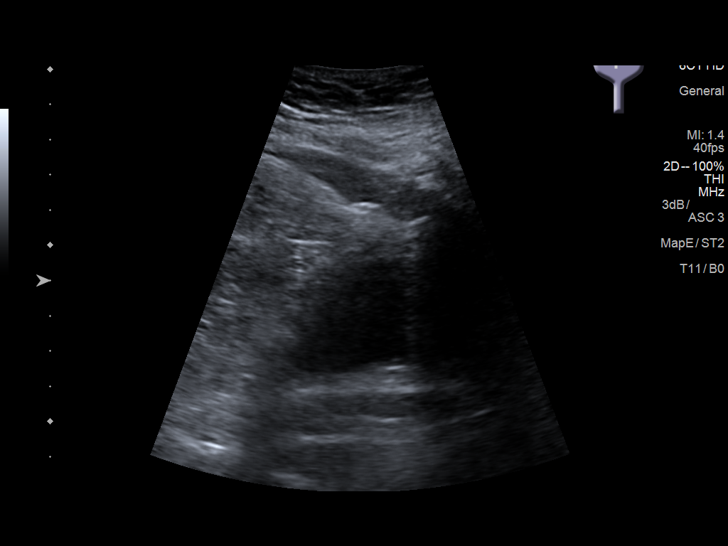
[im 38/38]
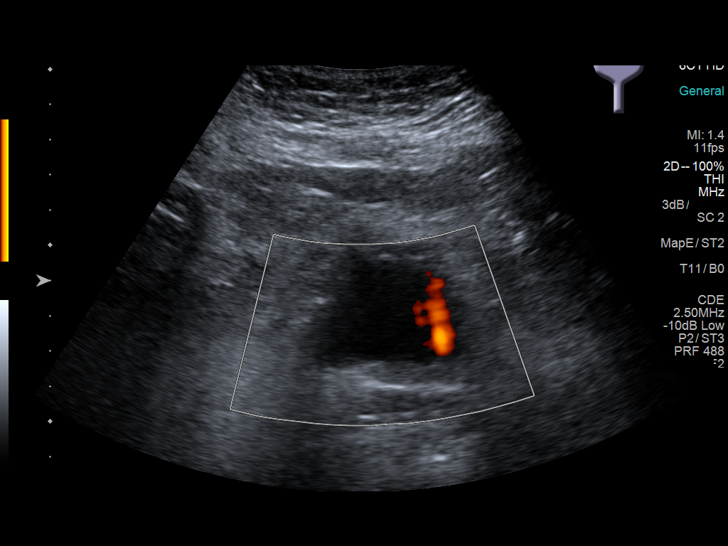

[14 of 25 positions shown; findings below may reference images not displayed]

FINDINGS: Right Kidney:

Length: 11.9 cm. Echogenicity within normal limits. No mass or
hydronephrosis visualized.

Left Kidney:

Length: 11.6 cm. Echogenicity within normal limits. Negative for
hydronephrosis. Cyst in the upper pole measuring 2.2 x 2 x 1.8 cm

Bladder:

Appears normal for degree of bladder distention. Incidental note
made of slightly enlarged spleen.
IMPRESSION: 1. Negative for hydronephrosis. Previously noted left renal stones
are not seen sonographically
2. Cyst in the upper pole of the left kidney
3. Enlarged spleen

## 2020-01-12 ENCOUNTER — Other Ambulatory Visit: Payer: Self-pay | Admitting: Internal Medicine

## 2020-01-12 DIAGNOSIS — Z1231 Encounter for screening mammogram for malignant neoplasm of breast: Secondary | ICD-10-CM

## 2020-02-03 ENCOUNTER — Other Ambulatory Visit: Payer: Self-pay

## 2020-02-03 ENCOUNTER — Ambulatory Visit
Admission: RE | Admit: 2020-02-03 | Discharge: 2020-02-03 | Disposition: A | Discharge status: Home / Self Care | Payer: Medicare PPO | Source: Ambulatory Visit | Attending: Internal Medicine | Admitting: Internal Medicine

## 2020-02-03 DIAGNOSIS — Z1231 Encounter for screening mammogram for malignant neoplasm of breast: Secondary | ICD-10-CM | POA: Insufficient documentation

## 2021-01-14 ENCOUNTER — Other Ambulatory Visit: Payer: Self-pay | Admitting: Internal Medicine

## 2021-01-14 DIAGNOSIS — Z1231 Encounter for screening mammogram for malignant neoplasm of breast: Secondary | ICD-10-CM

## 2021-02-05 ENCOUNTER — Ambulatory Visit
Admission: RE | Admit: 2021-02-05 | Discharge: 2021-02-05 | Disposition: A | Discharge status: Home / Self Care | Payer: Medicare PPO | Source: Ambulatory Visit | Attending: Internal Medicine | Admitting: Internal Medicine

## 2021-02-05 ENCOUNTER — Other Ambulatory Visit: Payer: Self-pay

## 2021-02-05 DIAGNOSIS — Z1231 Encounter for screening mammogram for malignant neoplasm of breast: Secondary | ICD-10-CM | POA: Insufficient documentation

## 2022-01-23 ENCOUNTER — Other Ambulatory Visit: Payer: Self-pay | Admitting: Internal Medicine

## 2022-01-23 DIAGNOSIS — Z1231 Encounter for screening mammogram for malignant neoplasm of breast: Secondary | ICD-10-CM

## 2022-01-26 IMAGING — MG MM DIGITAL SCREENING BILAT W/ TOMO AND CAD
8 series · 8 of 24 positions shown · non-contrast
Comparison: Previous exam(s).

CLINICAL DATA: Screening.

EXAM:
DIGITAL SCREENING BILATERAL MAMMOGRAM WITH TOMOSYNTHESIS AND CAD
TECHNIQUE: Bilateral screening digital craniocaudal and mediolateral oblique
mammograms were obtained. Bilateral screening digital breast
tomosynthesis was performed. The images were evaluated with
computer-aided detection.

[L MLO synth-2D]
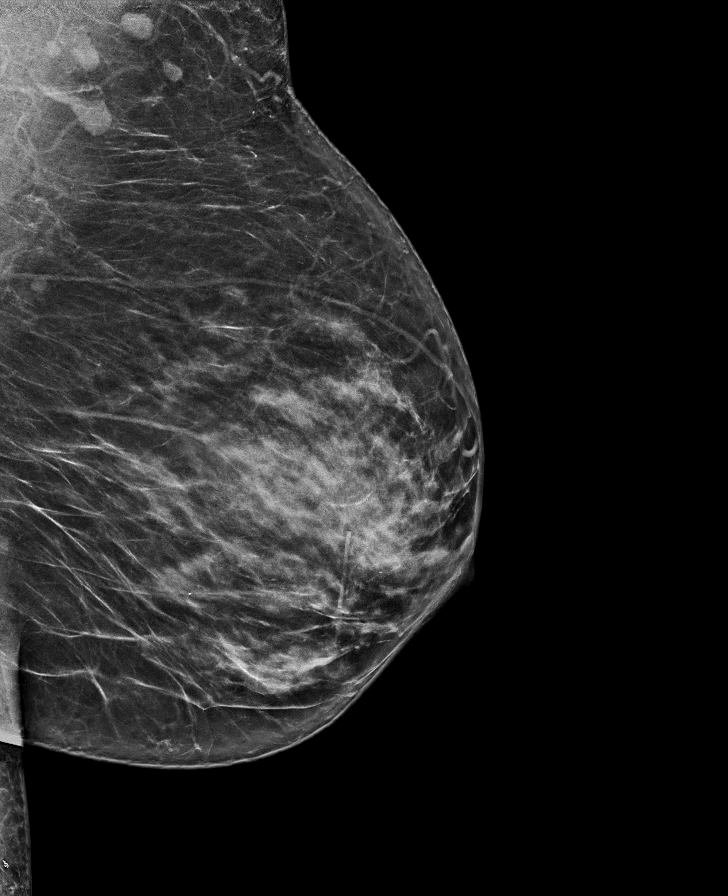

[R MLO synth-2D]
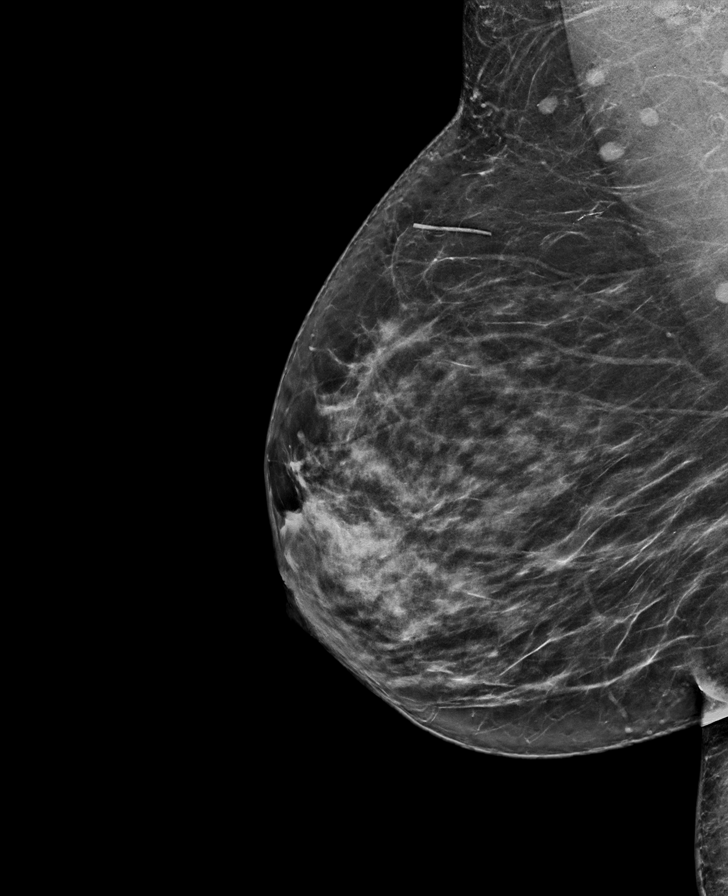

[L CC synth-2D]
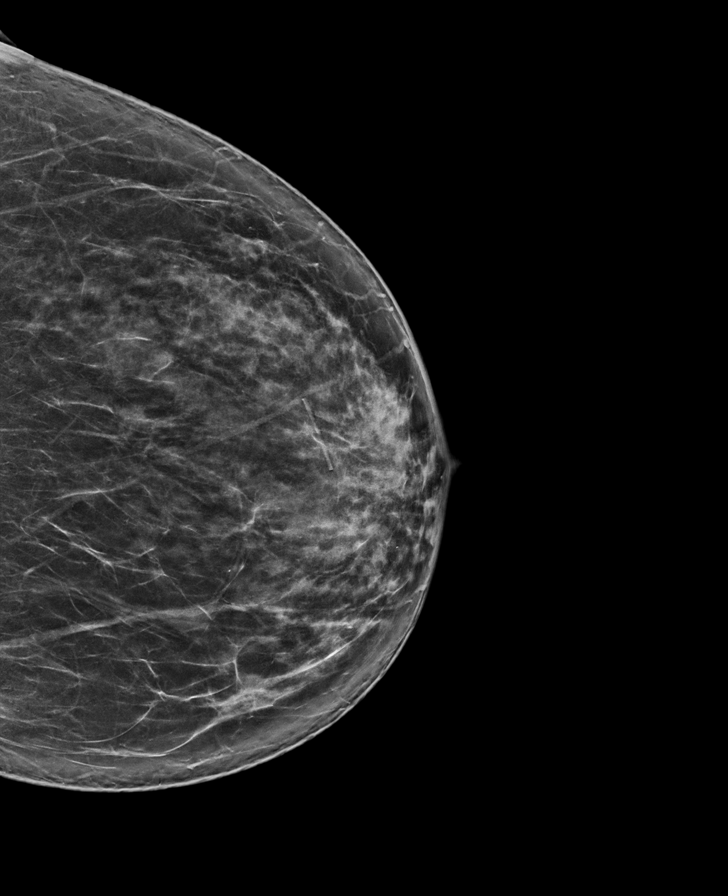

[R CC synth-2D]
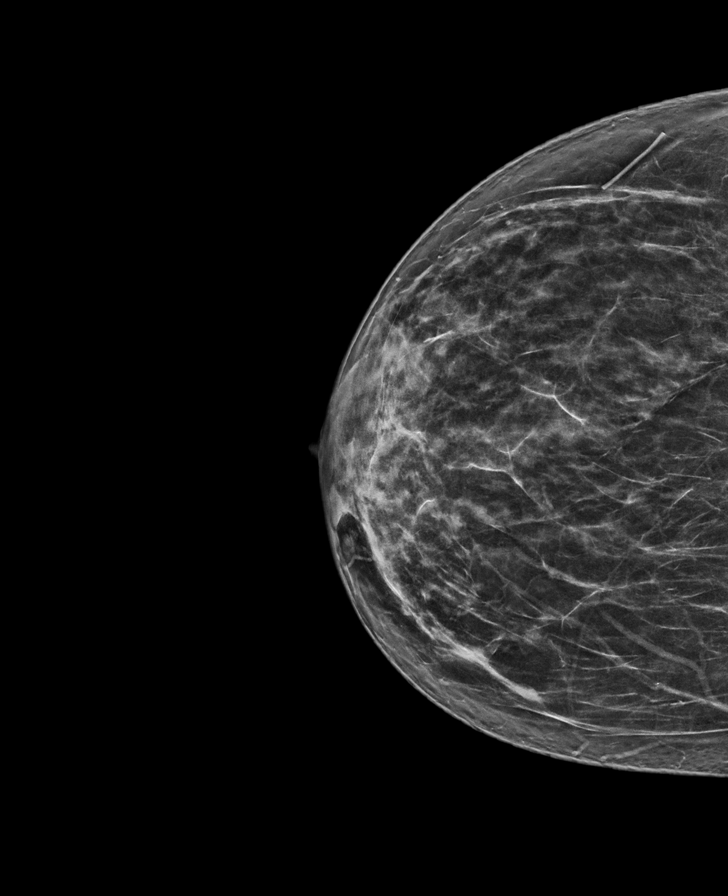

[R MLO tomo · tomo slice 37/74.0]
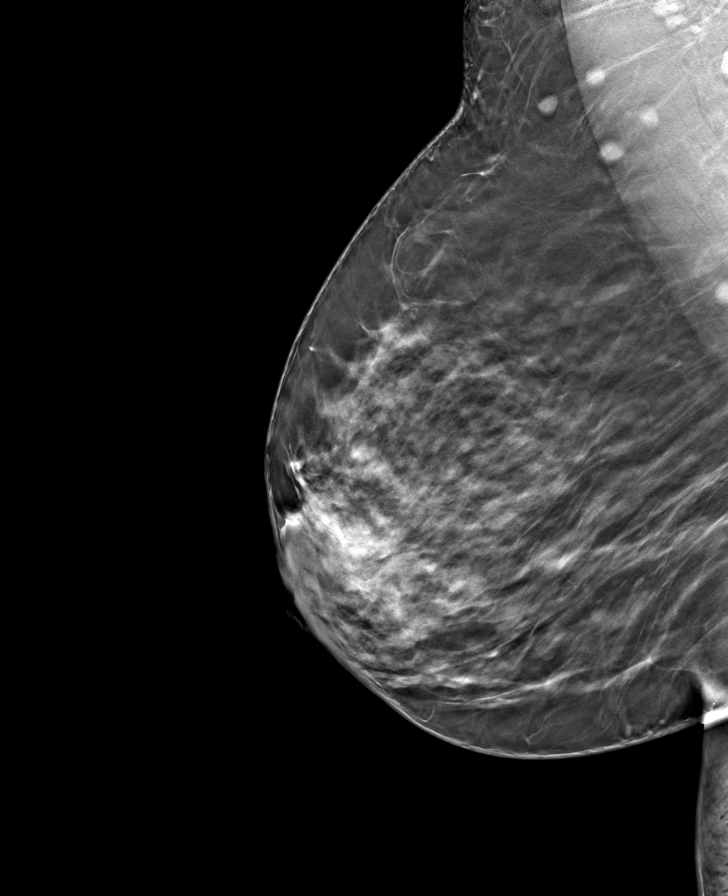

[R CC tomo · tomo slice 35/69.0]
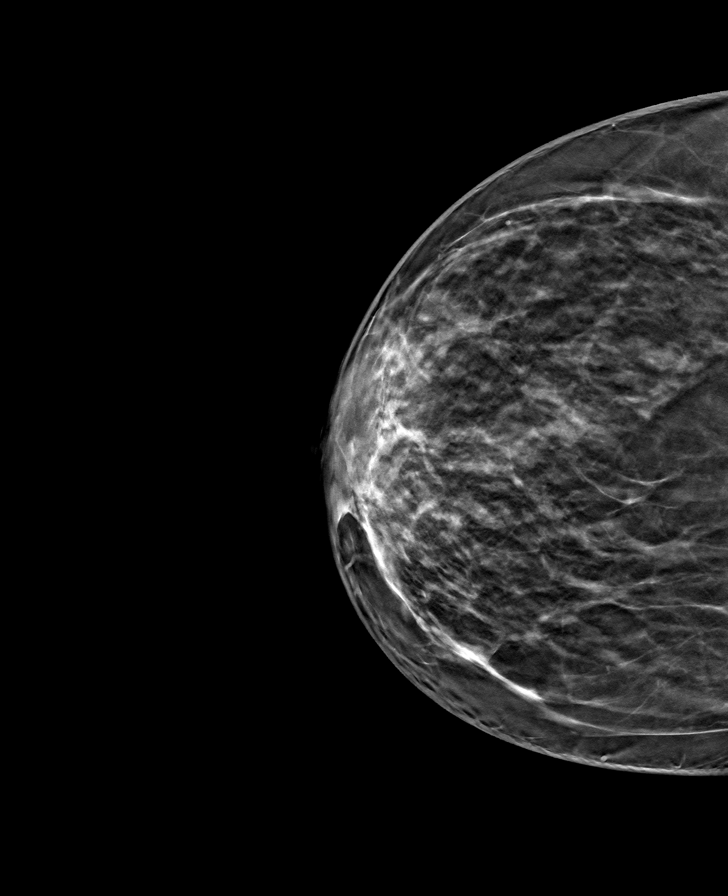

[L CC tomo · tomo slice 39/77.0]
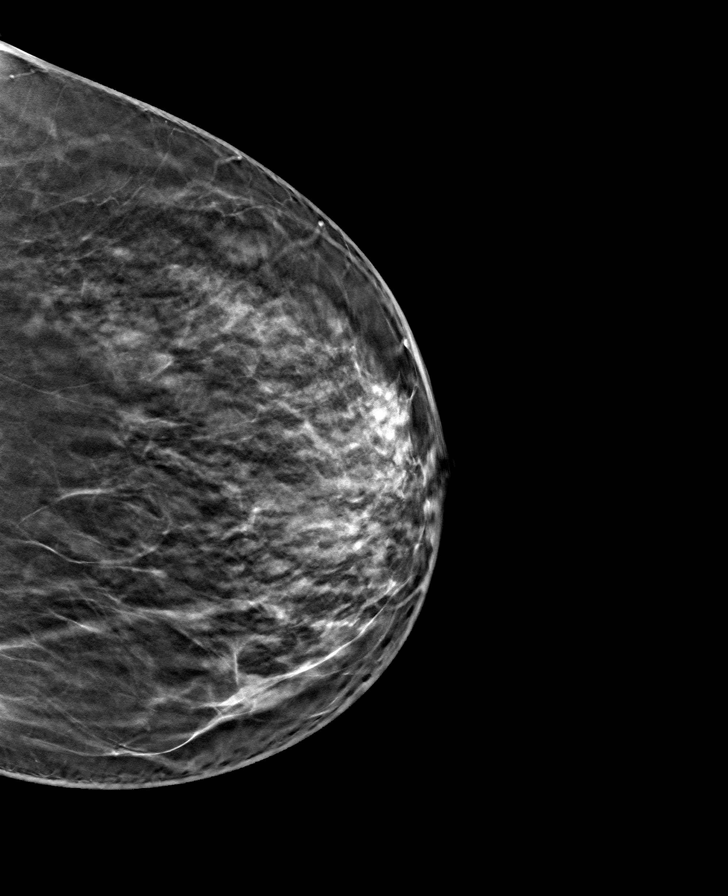

[L MLO tomo · tomo slice 37/72.0]
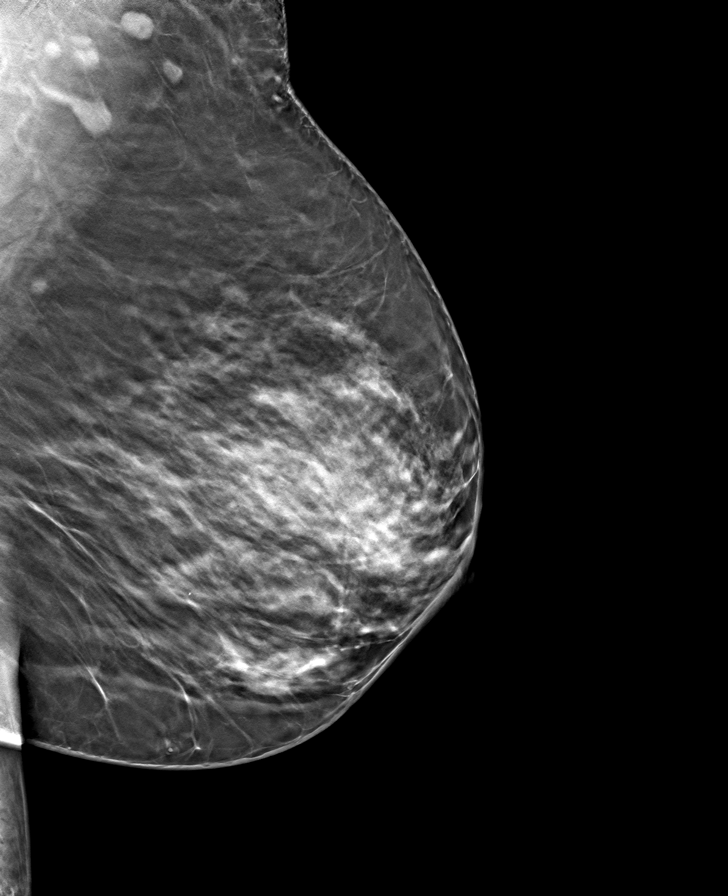

[8 of 24 positions shown; findings below may reference images not displayed]

ACR Breast Density Category c: The breast tissue is heterogeneously
dense, which may obscure small masses.
FINDINGS: There are no findings suspicious for malignancy.
IMPRESSION: No mammographic evidence of malignancy. A result letter of this
screening mammogram will be mailed directly to the patient.

RECOMMENDATION:
Screening mammogram in one year. (Code:Q3-W-BC3)

BI-RADS CATEGORY  1: Negative.

## 2022-02-07 ENCOUNTER — Ambulatory Visit
Admission: RE | Admit: 2022-02-07 | Discharge: 2022-02-07 | Disposition: A | Discharge status: Home / Self Care | Payer: Medicare PPO | Source: Ambulatory Visit | Attending: Internal Medicine | Admitting: Internal Medicine

## 2022-02-07 DIAGNOSIS — Z1231 Encounter for screening mammogram for malignant neoplasm of breast: Secondary | ICD-10-CM | POA: Diagnosis present

## 2022-11-25 ENCOUNTER — Other Ambulatory Visit: Payer: Self-pay | Admitting: Internal Medicine

## 2022-11-25 DIAGNOSIS — Z1231 Encounter for screening mammogram for malignant neoplasm of breast: Secondary | ICD-10-CM

## 2023-02-09 ENCOUNTER — Ambulatory Visit
Admission: RE | Admit: 2023-02-09 | Discharge: 2023-02-09 | Disposition: A | Discharge status: Home / Self Care | Payer: Medicare PPO | Source: Ambulatory Visit | Attending: Internal Medicine | Admitting: Internal Medicine

## 2023-02-09 DIAGNOSIS — Z1231 Encounter for screening mammogram for malignant neoplasm of breast: Secondary | ICD-10-CM | POA: Insufficient documentation

## 2023-03-30 ENCOUNTER — Encounter: Payer: Self-pay | Admitting: *Deleted

## 2023-03-31 ENCOUNTER — Ambulatory Visit
Admission: RE | Admit: 2023-03-31 | Discharge: 2023-03-31 | Disposition: A | Discharge status: Home / Self Care | Payer: Medicare PPO | Source: Ambulatory Visit | Attending: Gastroenterology | Admitting: Gastroenterology

## 2023-03-31 ENCOUNTER — Ambulatory Visit: Payer: Medicare PPO | Admitting: Anesthesiology

## 2023-03-31 ENCOUNTER — Encounter
Admission: RE | Disposition: A | Discharge status: Home / Self Care | Payer: Self-pay | Source: Ambulatory Visit | Attending: Gastroenterology

## 2023-03-31 DIAGNOSIS — K641 Second degree hemorrhoids: Secondary | ICD-10-CM | POA: Diagnosis not present

## 2023-03-31 DIAGNOSIS — K5120 Ulcerative (chronic) proctitis without complications: Secondary | ICD-10-CM | POA: Insufficient documentation

## 2023-03-31 DIAGNOSIS — Z1211 Encounter for screening for malignant neoplasm of colon: Secondary | ICD-10-CM | POA: Insufficient documentation

## 2023-03-31 DIAGNOSIS — K5730 Diverticulosis of large intestine without perforation or abscess without bleeding: Secondary | ICD-10-CM | POA: Diagnosis not present

## 2023-03-31 DIAGNOSIS — K635 Polyp of colon: Secondary | ICD-10-CM | POA: Diagnosis not present

## 2023-03-31 HISTORY — PX: POLYPECTOMY: SHX5525

## 2023-03-31 HISTORY — PX: COLONOSCOPY WITH PROPOFOL: SHX5780

## 2023-03-31 HISTORY — PX: BIOPSY: SHX5522

## 2023-03-31 LAB — GLUCOSE, CAPILLARY: Glucose-Capillary: 148 mg/dL — ABNORMAL HIGH (ref 70–99)

## 2023-03-31 SURGERY — COLONOSCOPY WITH PROPOFOL
Anesthesia: General

## 2023-03-31 MED ORDER — DEXMEDETOMIDINE HCL IN NACL 80 MCG/20ML IV SOLN
INTRAVENOUS | Status: DC | PRN
  Administered 2023-03-31: 20 ug via INTRAVENOUS

## 2023-03-31 MED ORDER — SODIUM CHLORIDE 0.9 % IV SOLN: INTRAVENOUS | Status: DC

## 2023-03-31 MED ORDER — PROPOFOL 500 MG/50ML IV EMUL
INTRAVENOUS | Status: DC | PRN
  Administered 2023-03-31: 75 ug/kg/min via INTRAVENOUS

## 2023-03-31 MED ORDER — PROPOFOL 10 MG/ML IV BOLUS
INTRAVENOUS | Status: DC | PRN
  Administered 2023-03-31: 50 mg via INTRAVENOUS

## 2023-03-31 MED ORDER — LIDOCAINE HCL (CARDIAC) PF 100 MG/5ML IV SOSY
PREFILLED_SYRINGE | INTRAVENOUS | Status: DC | PRN
  Administered 2023-03-31: 50 mg via INTRAVENOUS

## 2023-03-31 NOTE — Anesthesia Postprocedure Evaluation (Signed)
 Anesthesia Post Note  Patient: Megan Nixon  Procedure(s) Performed: COLONOSCOPY WITH PROPOFOL  POLYPECTOMY BIOPSY  Patient location during evaluation: Endoscopy Anesthesia Type: General Level of consciousness: awake and alert Pain management: pain level controlled Vital Signs Assessment: post-procedure vital signs reviewed and stable Respiratory status: spontaneous breathing, nonlabored ventilation, respiratory function stable and patient connected to nasal cannula oxygen Cardiovascular status: blood pressure returned to baseline and stable Postop Assessment: no apparent nausea or vomiting Anesthetic complications: no Comments: RN called to report EKG monitor reading afib with HR 50s, patient asymptomatic, upon arrival to bedside, p waves noted, patient appears to be in sinus arrhythmia. 12L EKG obtained, confirmed sinus arrhythmia. Instructed patient to follow up with PCP and given ER return precautions   No notable events documented.   Last Vitals:  Vitals:   03/31/23 0848 03/31/23 0912  BP:  110/74  Pulse: (!) 53   Resp:    Temp:    SpO2:      Last Pain:  Vitals:   03/31/23 0912  TempSrc:   PainSc: 0-No pain                 Lendia LITTIE Mae

## 2023-03-31 NOTE — Op Note (Signed)
 [facility] Patient Name: Megan Nixon Procedure Date: 03/31/2023 8:17 AM MRN: [id number] Account #: [id number] Date of Birth: 1950-10-23 Admit Type: Outpatient Age: 73 Room: [facility] ENDO ROOM 1 Gender: Female Note Status: Finalized Instrument Name: [id number] Procedure:             Colonoscopy Indications:           Screening for colorectal malignant neoplasm Providers:             Ole Schick MD, MD Medicines:             Monitored Anesthesia Care Complications:         No immediate complications. Estimated blood loss:                         Minimal. Procedure:             Pre-Anesthesia Assessment:                        - Prior to the procedure, a History and Physical was                         performed, and patient medications and allergies were                         reviewed. The patient is competent. The risks and                         benefits of the procedure and the sedation options and                         risks were discussed with the patient. All questions                         were answered and informed consent was obtained.                         Patient identification and proposed procedure were                         verified by the physician, the nurse, the                         anesthesiologist, the anesthetist and the technician                         in the endoscopy suite. Mental Status Examination:                         alert and oriented. Airway Examination: normal                         oropharyngeal airway and neck mobility. Respiratory                         Examination: clear to auscultation. CV Examination:                         normal. Prophylactic Antibiotics: The patient does not  require prophylactic antibiotics. Prior                         Anticoagulants: The patient has taken no anticoagulant                         or antiplatelet agents. ASA Grade  Assessment: III - A                         patient with severe systemic disease. After reviewing                         the risks and benefits, the patient was deemed in                         satisfactory condition to undergo the procedure. The                         anesthesia plan was to use monitored anesthesia care                         (MAC). Immediately prior to administration of                         medications, the patient was re-assessed for adequacy                         to receive sedatives. The heart rate, respiratory                         rate, oxygen saturations, blood pressure, adequacy of                         pulmonary ventilation, and response to care were                         monitored throughout the procedure. The physical                         status of the patient was re-assessed after the                         procedure.                        After obtaining informed consent, the colonoscope was                         passed under direct vision. Throughout the procedure,                         the patient's blood pressure, pulse, and oxygen                         saturations were monitored continuously. The                         Colonoscope was introduced through the anus and  advanced to the the terminal ileum, with                         identification of the appendiceal orifice and IC                         valve. The colonoscopy was performed without                         difficulty. The patient tolerated the procedure well.                         The quality of the bowel preparation was good. The                         terminal ileum, ileocecal valve, appendiceal orifice,                         and rectum were photographed. Findings:      The perianal and digital rectal examinations were normal.      The terminal ileum appeared normal.      A 1 mm polyp was found in the descending colon. The polyp was  sessile.       The polyp was removed with a jumbo cold forceps. Resection and retrieval       were complete. Estimated blood loss was minimal.      Scattered small-mouthed diverticula were found in the sigmoid colon and       descending colon.      Discontinuous areas of nonbleeding ulcerated mucosa with no stigmata of       recent bleeding were present in the recto-sigmoid colon. Biopsies were       taken with a cold forceps for histology. Estimated blood loss was       minimal.      Internal hemorrhoids were found during retroflexion. The hemorrhoids       were Grade II (internal hemorrhoids that prolapse but reduce       spontaneously).      The exam was otherwise without abnormality on direct and retroflexion       views. Impression:            - The examined portion of the ileum was normal.                        - One 1 mm polyp in the descending colon, removed with                         a jumbo cold forceps. Resected and retrieved.                        - Diverticulosis in the sigmoid colon and in the                         descending colon.                        - Mucosal ulceration in the recto-sigmoid colon.                         Biopsied.                        -  Internal hemorrhoids.                        - The examination was otherwise normal on direct and                         retroflexion views. Recommendation:        - Discharge patient to home.                        - Resume previous diet.                        - Continue present medications.                        - Await pathology results.                        - Repeat colonoscopy is not recommended due to current                         age (70 years or older) for surveillance.                        - Return to referring physician as previously                         scheduled. Procedure Code(s):     --- Professional ---                        [phone], Colonoscopy, flexible; with biopsy, single or                          multiple Diagnosis Code(s):     --- Professional ---                        Z12.11, Encounter for screening for malignant neoplasm                         of colon                        K64.1, Second degree hemorrhoids                        D12.4, Benign neoplasm of descending colon                        K63.3, Ulcer of intestine                        K57.30, Diverticulosis of large intestine without                         perforation or abscess without bleeding CPT copyright 2022 American [employer]. All rights reserved. The codes documented in this report are preliminary and upon coder review may  be revised to meet current compliance requirements. Ole Schick MD, MD 03/31/2023 8:43:38 AM Number of Addenda: 0 Note Initiated On: 03/31/2023 8:17 AM Scope Withdrawal Time: 0 hours 8 minutes 32 seconds  Total Procedure Duration: 0 hours 12 minutes 43 seconds  Estimated Blood Loss:  Estimated blood loss was minimal.      [facility]

## 2023-03-31 NOTE — H&P (Signed)
 Outpatient short stay form Pre-procedure 03/31/2023  Ole ONEIDA Schick, MD  Primary Physician: Cleotilde Oneil FALCON, MD  Reason for visit:  Screening  History of present illness:    73 y/o lady with history of DM II, OSA, and GERD here for colonoscopy for screening. Last colonoscopy in 2014 was unremarkable. 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 No blood thinners. No family history of GI malignancies. History of partial hysterectomy and ventral hernia repair.    Current Facility-Administered Medications:    0.9 %  sodium chloride  infusion, , Intravenous, Continuous, Arbell Wycoff, Ole ONEIDA, MD, Last Rate: 20 mL/hr at 03/31/23 0736, New Bag at 03/31/23 0736  Medications Prior to Admission  Medication Sig Dispense Refill Last Dose/Taking   verapamil  (VERELAN  PM) 180 MG 24 hr capsule Take 180 mg by mouth daily.    03/31/2023 Morning   aspirin EC 81 MG tablet Take by mouth.      Cyanocobalamin 1000 MCG LOZG Place under the tongue.      glimepiride (AMARYL) 1 MG tablet Take 1 mg by mouth daily with breakfast.       pantoprazole  (PROTONIX ) 40 MG tablet Take 1 tablet (40 mg total) by mouth daily. (Patient not taking: Reported on 11/25/2017) 40 tablet 0    vitamin B-12 (CYANOCOBALAMIN) 1000 MCG tablet Take 1,000 mcg by mouth daily.        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    Physical Exam  Gen: Alert, oriented. Appears stated age.  HEENT: PERRLA. Lungs: No respiratory distress CV: RRR Abd: soft, benign, no masses Ext: No edema    Planned procedures: Proceed with colonoscopy. The patient understands the nature of the planned procedure, indications, risks, alternatives and potential complications including but not limited to bleeding, infection, perforation, damage to internal organs and possible oversedation/side effects from anesthesia. The patient agrees and gives consent to proceed.  Please refer to procedure notes for findings, recommendations and patient disposition/instructions.     Ole ONEIDA Schick, MD [facility]

## 2023-03-31 NOTE — Interval H&P Note (Signed)
 History and Physical Interval Note:  03/31/2023 8:20 AM  Megan Nixon Margo  has presented today for surgery, with the diagnosis of CCA SCREEN.  The various methods of treatment have been discussed with the patient and family. After consideration of risks, benefits and other options for treatment, the patient has consented to  Procedure(s) with comments: COLONOSCOPY WITH PROPOFOL  (N/A) - DM as a surgical intervention.  The patient's history has been reviewed, patient examined, no change in status, stable for surgery.  I have reviewed the patient's chart and labs.  Questions were answered to the patient's satisfaction.     Ole ONEIDA Schick  Ok to proceed with colonoscopy

## 2023-03-31 NOTE — Transfer of Care (Signed)
 Immediate Anesthesia Transfer of Care Note  Patient: Megan Nixon  Procedure(s) Performed: COLONOSCOPY WITH PROPOFOL  POLYPECTOMY BIOPSY  Patient Location: PACU  Anesthesia Type:General  Level of Consciousness: sedated  Airway & Oxygen Therapy: Patient Spontanous Breathing and Patient connected to nasal cannula oxygen  Post-op Assessment: Report given to RN and Post -op Vital signs reviewed and stable  Post vital signs: Reviewed and stable  Last Vitals:  Vitals Value Taken Time  BP    Temp    Pulse 133 03/31/23 0843  Resp 19 03/31/23 0843  SpO2 95 % 03/31/23 0843  Vitals shown include unfiled device data.  Last Pain:  Vitals:   03/31/23 0724  TempSrc: Temporal  PainSc: 0-No pain         Complications: No notable events documented.

## 2023-03-31 NOTE — Progress Notes (Signed)
Monitor showed afib. Dr. Joelene Millin was notified and gave an order for a 12 lead EKG. EKG was performed as well as Dr. Joelene Millin assessed patient. Determined there is no afib. Ms. Megan Nixon to be discharged home and to follow up with her primary.

## 2023-03-31 NOTE — Anesthesia Preprocedure Evaluation (Signed)
Anesthesia Evaluation  Patient identified by MRN, date of birth, ID band Patient awake    Reviewed: Allergy & Precautions, NPO status , Patient's Chart, lab work & pertinent test results  History of Anesthesia Complications Negative for: history of anesthetic complications  Airway Mallampati: II  TM Distance: >3 FB Neck ROM: full    Dental no notable dental hx.    Pulmonary sleep apnea    Pulmonary exam normal        Cardiovascular hypertension, On Medications + Peripheral Vascular Disease  Normal cardiovascular exam     Neuro/Psych CVA, No Residual Symptoms  negative psych ROS   GI/Hepatic Neg liver ROS,GERD  Medicated,,  Endo/Other  negative endocrine ROSdiabetes, Well Controlled, Type 2, Oral Hypoglycemic Agents    Renal/GU negative Renal ROS  negative genitourinary   Musculoskeletal   Abdominal   Peds  Hematology negative hematology ROS (+)   Anesthesia Other Findings Past Medical History: 2018: Acute renal failure (HCC) 09/17/2016: Benign essential hypertension 11/21/2013: Diabetes mellitus type 2, controlled, with complications  (HCC)     Comment:  Overview:  microalbuminuria No date: Diabetes mellitus without complication (HCC) No date: Dyspnea No date: GERD (gastroesophageal reflux disease) 06/16/2016: History of CVA (cerebrovascular accident)     Comment:  Overview:  Thalamic CVA, 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 No date: Hypertension 06/16/2016: Medicare annual wellness visit, initial     Comment:  Overview:  4/18 09/17/2016: Microscopic hematuria 09/2016: Nephrolithiasis 11/21/2013: OSA (obstructive sleep apnea) No date: PVD (peripheral vascular disease) (HCC)     Comment:  blisters on Lt foot sees Dr Wyn Quaker No date: Septic shock due to urinary tract infection (HCC) 04/2014: Stroke [facility])  Past Surgical History: 1971: BREAST EXCISIONAL BIOPSY; Bilateral     Comment:  neg No date: COMBINED LAPAROSCOPY W/  HYSTEROSCOPY 10/20/2016: CYSTOSCOPY/URETEROSCOPY/HOLMIUM LASER/STENT PLACEMENT; Left     Comment:  Procedure: CYSTOSCOPY/URETEROSCOPY/HOLMIUM LASER/STENT               PLACEMENT;  Surgeon: Vanna Scotland, MD;  Location: [facility]              ORS;  Service: Urology;  Laterality: Left; 2013: EXTRACORPOREAL SHOCK WAVE LITHOTRIPSY 10/02/2016: EXTRACORPOREAL SHOCK WAVE LITHOTRIPSY; Left     Comment:  Procedure: EXTRACORPOREAL SHOCK WAVE LITHOTRIPSY (ESWL);              Surgeon: Vanna Scotland, MD;  Location: [facility] ORS;                Service: Urology;  Laterality: Left; 08/24/2016: IR NEPHROSTOMY PLACEMENT LEFT 11/13/2016: IRRIGATION AND DEBRIDEMENT FOOT; Right     Comment:  Procedure: IRRIGATION AND DEBRIDEMENT RIGHT FOURTH TOE;               Surgeon: Annice Needy, MD;  Location: [facility] ORS;  Service:              Vascular;  Laterality: Right; No date: VAGINAL HYSTERECTOMY  BMI    Body Mass Index: 29.05 kg/m      Reproductive/Obstetrics negative OB ROS                             Anesthesia Physical Anesthesia Plan  ASA: 3  Anesthesia Plan: General   Post-op Pain Management: Minimal or no pain anticipated   Induction: Intravenous  PONV Risk Score and Plan: 2 and Propofol infusion and TIVA  Airway Management Planned: Natural Airway and Nasal Cannula  Additional Equipment:   Intra-op Plan:   Post-operative  Plan:   Informed Consent: I have reviewed the patients History and Physical, chart, labs and discussed the procedure including the risks, benefits and alternatives for the proposed anesthesia with the patient or authorized representative who has indicated his/her understanding and acceptance.     Dental Advisory Given  Plan Discussed with: Anesthesiologist, CRNA and Surgeon  Anesthesia Plan Comments: (Patient consented for risks of anesthesia including but not limited to:  - adverse reactions to medications - risk of airway placement if required -  damage to eyes, teeth, lips or other oral mucosa - nerve damage due to positioning  - sore throat or hoarseness - Damage to heart, brain, nerves, lungs, other parts of body or loss of life  Patient voiced understanding and assent.)        Anesthesia Quick Evaluation

## 2023-04-01 ENCOUNTER — Encounter: Payer: Self-pay | Admitting: Gastroenterology

## 2023-04-01 LAB — SURGICAL PATHOLOGY

## 2023-12-28 ENCOUNTER — Other Ambulatory Visit: Payer: Self-pay | Admitting: Internal Medicine

## 2023-12-28 DIAGNOSIS — Z1231 Encounter for screening mammogram for malignant neoplasm of breast: Secondary | ICD-10-CM

## 2024-02-11 ENCOUNTER — Inpatient Hospital Stay: Admission: RE | Admit: 2024-02-11 | Discharge: 2024-02-11 | Attending: Internal Medicine | Admitting: Internal Medicine

## 2024-02-11 DIAGNOSIS — Z1231 Encounter for screening mammogram for malignant neoplasm of breast: Secondary | ICD-10-CM | POA: Diagnosis present
